# Patient Record
Sex: Male | Born: 1945 | Hispanic: Refuse to answer | Marital: Married | Smoking: Never smoker
Health system: Southern US, Community
[De-identification: ages and names within clinical notes are randomized; demographics above are authoritative.]

## PROBLEM LIST (undated history)

## (undated) DIAGNOSIS — I517 Cardiomegaly: Secondary | ICD-10-CM

## (undated) DIAGNOSIS — T884XXA Failed or difficult intubation, initial encounter: Secondary | ICD-10-CM

## (undated) DIAGNOSIS — H409 Unspecified glaucoma: Secondary | ICD-10-CM

## (undated) DIAGNOSIS — I25119 Atherosclerotic heart disease of native coronary artery with unspecified angina pectoris: Secondary | ICD-10-CM

## (undated) DIAGNOSIS — M1990 Unspecified osteoarthritis, unspecified site: Secondary | ICD-10-CM

## (undated) DIAGNOSIS — E785 Hyperlipidemia, unspecified: Secondary | ICD-10-CM

## (undated) DIAGNOSIS — I10 Essential (primary) hypertension: Secondary | ICD-10-CM

## (undated) DIAGNOSIS — E119 Type 2 diabetes mellitus without complications: Secondary | ICD-10-CM

## (undated) DIAGNOSIS — I2510 Atherosclerotic heart disease of native coronary artery without angina pectoris: Secondary | ICD-10-CM

## (undated) DIAGNOSIS — K635 Polyp of colon: Secondary | ICD-10-CM

## (undated) DIAGNOSIS — T4145XA Adverse effect of unspecified anesthetic, initial encounter: Secondary | ICD-10-CM

## (undated) DIAGNOSIS — T8859XA Other complications of anesthesia, initial encounter: Secondary | ICD-10-CM

## (undated) DIAGNOSIS — C801 Malignant (primary) neoplasm, unspecified: Secondary | ICD-10-CM

## (undated) DIAGNOSIS — M4800 Spinal stenosis, site unspecified: Secondary | ICD-10-CM

## (undated) DIAGNOSIS — K5792 Diverticulitis of intestine, part unspecified, without perforation or abscess without bleeding: Secondary | ICD-10-CM

## (undated) DIAGNOSIS — Z87442 Personal history of urinary calculi: Secondary | ICD-10-CM

## (undated) DIAGNOSIS — K219 Gastro-esophageal reflux disease without esophagitis: Secondary | ICD-10-CM

## (undated) DIAGNOSIS — G4730 Sleep apnea, unspecified: Secondary | ICD-10-CM

## (undated) DIAGNOSIS — R51 Headache: Secondary | ICD-10-CM

## (undated) HISTORY — DX: Hyperlipidemia, unspecified: E78.5

## (undated) HISTORY — PX: OTHER SURGICAL HISTORY: SHX169

## (undated) HISTORY — DX: Spinal stenosis, site unspecified: M48.00

## (undated) HISTORY — PX: COLON SURGERY: SHX602

## (undated) HISTORY — DX: Cardiomegaly: I51.7

## (undated) HISTORY — PX: LITHOTRIPSY: SUR834

## (undated) HISTORY — DX: Unspecified glaucoma: H40.9

## (undated) HISTORY — DX: Atherosclerotic heart disease of native coronary artery with unspecified angina pectoris: I25.119

## (undated) HISTORY — PX: TONSILLECTOMY: SUR1361

## (undated) HISTORY — DX: Diverticulitis of intestine, part unspecified, without perforation or abscess without bleeding: K57.92

## (undated) HISTORY — PX: CORONARY ANGIOPLASTY: SHX604

## (undated) HISTORY — PX: CATARACT EXTRACTION: SUR2

## (undated) SURGERY — Surgical Case
Anesthesia: *Unknown

---

## 1983-11-17 HISTORY — PX: BOWEL RESECTION: SHX1257

## 1988-11-16 HISTORY — PX: CHOLECYSTECTOMY: SHX55

## 1989-11-16 HISTORY — PX: APPENDECTOMY: SHX54

## 2004-02-10 ENCOUNTER — Emergency Department (HOSPITAL_COMMUNITY): Admission: EM | Admit: 2004-02-10 | Discharge: 2004-02-10 | Payer: Self-pay | Admitting: Emergency Medicine

## 2004-11-16 HISTORY — PX: CARDIAC CATHETERIZATION: SHX172

## 2005-07-23 ENCOUNTER — Ambulatory Visit (HOSPITAL_COMMUNITY): Admission: RE | Admit: 2005-07-23 | Discharge: 2005-07-24 | Payer: Self-pay | Admitting: Cardiovascular Disease

## 2005-08-27 ENCOUNTER — Encounter (HOSPITAL_COMMUNITY): Admission: RE | Admit: 2005-08-27 | Discharge: 2005-11-25 | Payer: Self-pay | Admitting: Cardiovascular Disease

## 2005-11-26 ENCOUNTER — Encounter (HOSPITAL_COMMUNITY): Admission: RE | Admit: 2005-11-26 | Discharge: 2006-02-24 | Payer: Self-pay | Admitting: Cardiovascular Disease

## 2007-06-09 ENCOUNTER — Ambulatory Visit (HOSPITAL_COMMUNITY): Admission: RE | Admit: 2007-06-09 | Discharge: 2007-06-09 | Payer: Self-pay | Admitting: Otolaryngology

## 2007-10-28 ENCOUNTER — Ambulatory Visit: Payer: Self-pay | Admitting: Cardiology

## 2007-11-03 ENCOUNTER — Ambulatory Visit: Payer: Self-pay | Admitting: Cardiology

## 2007-12-08 ENCOUNTER — Ambulatory Visit: Payer: Self-pay

## 2007-12-08 LAB — CONVERTED CEMR LAB
Albumin: 3.8 g/dL (ref 3.5–5.2)
Cholesterol: 114 mg/dL (ref 0–200)
HDL: 30.8 mg/dL — ABNORMAL LOW (ref >39.0)
LDL Cholesterol: 62 mg/dL (ref 0–99)
Total Bilirubin: 0.8 mg/dL (ref 0.3–1.2)
Total Protein: 6.4 g/dL (ref 6.0–8.3)
VLDL: 21 mg/dL (ref 0–40)

## 2007-12-15 ENCOUNTER — Ambulatory Visit: Payer: Self-pay | Admitting: Cardiology

## 2008-04-26 ENCOUNTER — Ambulatory Visit: Payer: Self-pay | Admitting: Cardiology

## 2008-05-02 ENCOUNTER — Ambulatory Visit: Payer: Self-pay | Admitting: Cardiology

## 2008-07-11 ENCOUNTER — Encounter: Admission: RE | Admit: 2008-07-11 | Discharge: 2008-07-11 | Payer: Self-pay | Admitting: Family Medicine

## 2008-07-18 ENCOUNTER — Ambulatory Visit: Payer: Self-pay | Admitting: Cardiology

## 2008-07-18 LAB — CONVERTED CEMR LAB
ALT: 24 units/L (ref 0–53)
AST: 17 units/L (ref 0–37)
Albumin: 3.9 g/dL (ref 3.5–5.2)
Cholesterol: 162 mg/dL (ref 0–200)
LDL Cholesterol: 110 mg/dL — ABNORMAL HIGH (ref 0–99)
Total Protein: 6.4 g/dL (ref 6.0–8.3)
Triglycerides: 93 mg/dL (ref 0–149)

## 2008-07-26 ENCOUNTER — Ambulatory Visit: Payer: Self-pay | Admitting: Cardiology

## 2008-08-06 ENCOUNTER — Encounter: Admission: RE | Admit: 2008-08-06 | Discharge: 2008-08-06 | Payer: Self-pay | Admitting: Family Medicine

## 2008-09-27 ENCOUNTER — Ambulatory Visit: Payer: Self-pay | Admitting: Cardiology

## 2008-09-27 LAB — CONVERTED CEMR LAB
ALT: 33 units/L (ref 0–53)
Albumin: 4.1 g/dL (ref 3.5–5.2)
Bilirubin, Direct: 0.2 mg/dL (ref 0.0–0.3)
Direct LDL: 68.5 mg/dL
LDL Cholesterol: 71 mg/dL (ref 0–99)
Total Bilirubin: 0.9 mg/dL (ref 0.3–1.2)
Total CHOL/HDL Ratio: 4.6
Triglycerides: 125 mg/dL (ref 0–149)

## 2008-10-04 ENCOUNTER — Ambulatory Visit: Payer: Self-pay | Admitting: Internal Medicine

## 2008-10-30 ENCOUNTER — Ambulatory Visit: Payer: Self-pay

## 2008-10-30 ENCOUNTER — Ambulatory Visit: Payer: Self-pay | Admitting: Cardiology

## 2008-10-30 LAB — CONVERTED CEMR LAB: Creatinine, Ser: 1.2 mg/dL (ref 0.4–1.5)

## 2008-11-01 ENCOUNTER — Ambulatory Visit: Payer: Self-pay

## 2009-07-05 ENCOUNTER — Encounter: Payer: Self-pay | Admitting: Cardiology

## 2009-12-16 ENCOUNTER — Encounter: Payer: Self-pay | Admitting: Cardiology

## 2009-12-17 ENCOUNTER — Telehealth: Payer: Self-pay | Admitting: Cardiology

## 2010-05-02 ENCOUNTER — Ambulatory Visit (HOSPITAL_COMMUNITY): Admission: RE | Admit: 2010-05-02 | Discharge: 2010-05-03 | Payer: Self-pay | Admitting: Urology

## 2010-10-14 ENCOUNTER — Ambulatory Visit: Payer: Self-pay | Admitting: Cardiology

## 2010-10-14 DIAGNOSIS — G43909 Migraine, unspecified, not intractable, without status migrainosus: Secondary | ICD-10-CM

## 2010-10-14 DIAGNOSIS — E785 Hyperlipidemia, unspecified: Secondary | ICD-10-CM | POA: Insufficient documentation

## 2010-10-14 DIAGNOSIS — H409 Unspecified glaucoma: Secondary | ICD-10-CM | POA: Insufficient documentation

## 2010-10-14 DIAGNOSIS — E119 Type 2 diabetes mellitus without complications: Secondary | ICD-10-CM

## 2010-10-14 DIAGNOSIS — K5730 Diverticulosis of large intestine without perforation or abscess without bleeding: Secondary | ICD-10-CM | POA: Insufficient documentation

## 2010-10-14 DIAGNOSIS — R0602 Shortness of breath: Secondary | ICD-10-CM | POA: Insufficient documentation

## 2010-10-14 DIAGNOSIS — G4733 Obstructive sleep apnea (adult) (pediatric): Secondary | ICD-10-CM | POA: Insufficient documentation

## 2010-10-14 DIAGNOSIS — I2510 Atherosclerotic heart disease of native coronary artery without angina pectoris: Secondary | ICD-10-CM

## 2010-10-14 DIAGNOSIS — I10 Essential (primary) hypertension: Secondary | ICD-10-CM | POA: Insufficient documentation

## 2010-10-23 ENCOUNTER — Encounter (INDEPENDENT_AMBULATORY_CARE_PROVIDER_SITE_OTHER): Payer: Self-pay | Admitting: *Deleted

## 2010-12-07 ENCOUNTER — Encounter: Payer: Self-pay | Admitting: Urology

## 2010-12-16 NOTE — Assessment & Plan Note (Signed)
Summary: F2Y./ PT HAS [employer]/  GD  Medications Added PROPRANOLOL HCL CR 120 MG XR24H-CAP (PROPRANOLOL HCL) Take 1 capsule by mouth two times a day LISINOPRIL 10 MG TABS (LISINOPRIL) Take one tablet by mouth daily TRICOR 145 MG TABS (FENOFIBRATE) Take 1 tablet by mouth once a day LIPITOR 80 MG TABS (ATORVASTATIN CALCIUM) Take one tablet by mouth daily. ASPIRIN 81 MG TBEC (ASPIRIN) Take one tablet by mouth daily METFORMIN HCL 500 MG TABS (METFORMIN HCL) Take 1 tablet by mouth once a day GLIMEPIRIDE 4 MG TABS (GLIMEPIRIDE) take 1/2 tablet two times a day FISH OIL 1200 MG CAPS (OMEGA-3 FATTY ACIDS) Take 1 capsule by mouth two times a day MULTIVITAMINS  TABS (MULTIPLE VITAMIN) Take 1 tablet by mouth once a day EYE VITAMINS  CAPS (MULTIPLE VITAMINS-MINERALS) Take 1 capsule by mouth once a day      Allergies Added: NKDA  Visit Type:  2 years follow up Primary Provider:  Nadyne Coombes  CC:  No complaints.  History of Present Illness: [name] is a pleasant gentleman who has a history of coronary artery disease.  He has had previous PCI of his right coronary artery using a Cypher drug-eluting stent in 2006.  His ejection fraction is preserved. Last Myoview in Dec 2009 showed normal LV function with an ejection fraction of 68% and no ischemia or infarction. I have not seen him since Dec 2009. Since then, the patient has dyspnea with more extreme activities but not with routine activities. It is relieved with rest. It is not associated with chest pain. There is no orthopnea, PND or pedal edema. There is no syncope or palpitations. There is no exertional chest pain.    Current Medications (verified): 1)  Propranolol Hcl Cr 120 Mg Xr24h-Cap (Propranolol Hcl) .... Take 1 Capsule By Mouth Two Times A Day 2)  Lisinopril 10 Mg Tabs (Lisinopril) .... Take One Tablet By Mouth Daily 3)  Tricor 145 Mg Tabs (Fenofibrate) .... Take 1 Tablet By Mouth Once A Day 4)  Lipitor 80 Mg Tabs (Atorvastatin Calcium)  .... Take One Tablet By Mouth Daily. 5)  Aspirin 81 Mg Tbec (Aspirin) .... Take One Tablet By Mouth Daily 6)  Metformin Hcl 500 Mg Tabs (Metformin Hcl) .... Take 1 Tablet By Mouth Once A Day 7)  Glimepiride 4 Mg Tabs (Glimepiride) .... Take 1/2 Tablet Two Times A Day 8)  Fish Oil 1200 Mg Caps (Omega-3 Fatty Acids) .... Take 1 Capsule By Mouth Two Times A Day 9)  Multivitamins  Tabs (Multiple Vitamin) .... Take 1 Tablet By Mouth Once A Day 10)  Eye Vitamins  Caps (Multiple Vitamins-Minerals) .... Take 1 Capsule By Mouth Once A Day  Allergies (verified): No Known Drug Allergies  Past History:  Past Medical History: HYPERLIPIDEMIA  HYPERTENSION CAD OBSTRUCTIVE SLEEP APNEA  MIGRAINE HEADACHE  GLAUCOMA DIVERTICULAR DISEASE DM   Past Surgical History: Reviewed history from 10/14/2010 and no changes required. PCI of his right coronary  artery using a Cypher drug-eluting stent in 2006.  Social History: Reviewed history from 10/14/2010 and no changes required.  He does not smoke. He consumes one alcoholic beverage   per evening. He is married, but has no children.   Review of Systems       no fevers or chills, productive cough, hemoptysis, dysphasia, odynophagia, melena, hematochezia, dysuria, hematuria, rash, seizure activity, orthopnea, PND, pedal edema, claudication. Remaining systems are negative.   Vital Signs:  Patient profile:   65 year old male Height:  72 inches Weight:      215.50 pounds BMI:     29.33 Pulse rate:   60 / minute Pulse rhythm:   regular Resp:     18 per minute BP sitting:   136 / 80  (left arm) Cuff size:   large  Vitals Entered By: Vikki Ports (October 14, 2010 12:00 PM)  Physical Exam  General:  Well-developed well-nourished in no acute distress.  Skin is warm and dry.  HEENT is normal.  Neck is supple. No thyromegaly.  Chest is clear to auscultation with normal expansion.  Cardiovascular exam is regular rate and rhythm.  Abdominal  exam nontender or distended. No masses palpated. Extremities show no edema. neuro grossly intact    EKG  Procedure date:  10/14/2010  Findings:      Sinus rhythm at a rate of 60. Nonspecific ST changes.  Impression & Recommendations:  Problem # 1:  HYPERLIPIDEMIA (ICD-272.4) Continue present medications. Lipids and liver monitored by primary care. His updated medication list for this problem includes:    Tricor 145 Mg Tabs (Fenofibrate) .Marland Kitchen... Take 1 tablet by mouth once a day    Lipitor 80 Mg Tabs (Atorvastatin calcium) .Marland Kitchen... Take one tablet by mouth daily.  Problem # 2:  HYPERTENSION (ICD-401.9) Blood pressure controlled on present medications. Will continue. Potassium and renal function monitored by primary care. His updated medication list for this problem includes:    Propranolol Hcl Cr 120 Mg Xr24h-cap (Propranolol hcl) .Marland Kitchen... Take 1 capsule by mouth two times a day    Lisinopril 10 Mg Tabs (Lisinopril) .Marland Kitchen... Take one tablet by mouth daily    Aspirin 81 Mg Tbec (Aspirin) .Marland Kitchen... Take one tablet by mouth daily  Problem # 3:  CAD (ICD-414.00) Continue aspirin, beta blocker, ACE inhibitor and statin. Followup Myoview when he returns in one year. His updated medication list for this problem includes:    Propranolol Hcl Cr 120 Mg Xr24h-cap (Propranolol hcl) .Marland Kitchen... Take 1 capsule by mouth two times a day    Lisinopril 10 Mg Tabs (Lisinopril) .Marland Kitchen... Take one tablet by mouth daily    Aspirin 81 Mg Tbec (Aspirin) .Marland Kitchen... Take one tablet by mouth daily  Orders: EKG w/ Interpretation (93000)  Problem # 4:  OBSTRUCTIVE SLEEP APNEA (ICD-327.23)  Problem # 5:  DM (ICD-250.00) Management per primary care. His updated medication list for this problem includes:    Lisinopril 10 Mg Tabs (Lisinopril) .Marland Kitchen... Take one tablet by mouth daily    Aspirin 81 Mg Tbec (Aspirin) .Marland Kitchen... Take one tablet by mouth daily    Metformin Hcl 500 Mg Tabs (Metformin hcl) .Marland Kitchen... Take 1 tablet by mouth once a day     Glimepiride 4 Mg Tabs (Glimepiride) .Marland Kitchen... Take 1/2 tablet two times a day  Patient Instructions: 1)  Your physician recommends that you schedule a follow-up appointment in: YEAR WITH DR CRENSHAW 2)  Your physician recommends that you continue on your current medications as directed. Please refer to the Current Medication list given to you today.

## 2010-12-16 NOTE — Miscellaneous (Signed)
  Clinical Lists Changes med varified with pt Deliah Goody, RN  October 23, 2010 5:09 PM  Medications: Changed medication from LISINOPRIL 10 MG TABS (LISINOPRIL) Take one tablet by mouth daily to LISINOPRIL 20 MG TABS (LISINOPRIL) Take one tablet by mouth daily Added new medication of AMLODIPINE BESYLATE 5 MG TABS (AMLODIPINE BESYLATE) Take one tablet by mouth daily Added new medication of ALPHAGAN P 0.1 % SOLN (BRIMONIDINE TARTRATE) two times a day Added new medication of LUMIGAN 0.03 % SOLN (BIMATOPROST) once daily in the evening Added new medication of DIVALPROEX SODIUM 250 MG XR24H-TAB (DIVALPROEX SODIUM) one tab by mouth two times a day Added new medication of MELOXICAM 7.5 MG TABS (MELOXICAM) 1-2 tab by mouth as needed Added new medication of OXYCODONE HCL 5 MG TABS (OXYCODONE HCL) 1-2 tabs by mouth as needed Added new medication of * VITAMIN ATHRUZ PLUS LUTEIN one tab once daily Added new medication of VITAMIN D3 2000 UNIT CAPS (CHOLECALCIFEROL) once daily Added new medication of FISH OIL 1200 MG CAPS (OMEGA-3 FATTY ACIDS) one tab two to three times daily Added new medication of * MAGESIUM 60-125MG  as needed

## 2010-12-16 NOTE — Progress Notes (Signed)
Summary: stop plavix   Phone Note From Other Clinic   Summary of Call: Per Morrie Sheldon pt having colonoscopy on the 10th needs to stop Plavix & ASA 5 days prior ofc [phone] fax [phone] Initial call taken by: Edman Circle,  December 17, 2009 8:47 AM  Follow-up for Phone Call        ok to stop plavix for good; continue asa 81 mg by mouth daily prior to colonoscopy if possible Ferman Hamming, MD, [facility]  December 17, 2009 1:50 PM  Left message to call back Deliah Goody, RN  December 17, 2009 3:46 PM  RETURNING Blanca Friend  December 18, 2009 8:47 AM   Additional Follow-up for Phone Call Additional follow up Details #1::        spoke with pt,  he is aware he can stop his plavix Deliah Goody, RN  December 18, 2009 12:50 PM

## 2011-02-01 LAB — GLUCOSE, CAPILLARY
Glucose-Capillary: 124 mg/dL — ABNORMAL HIGH (ref 70–99)
Glucose-Capillary: 144 mg/dL — ABNORMAL HIGH (ref 70–99)
Glucose-Capillary: 154 mg/dL — ABNORMAL HIGH (ref 70–99)
Glucose-Capillary: 163 mg/dL — ABNORMAL HIGH (ref 70–99)
Glucose-Capillary: 164 mg/dL — ABNORMAL HIGH (ref 70–99)
Glucose-Capillary: 174 mg/dL — ABNORMAL HIGH (ref 70–99)
Glucose-Capillary: 177 mg/dL — ABNORMAL HIGH (ref 70–99)

## 2011-02-02 LAB — COMPREHENSIVE METABOLIC PANEL
ALT: 27 U/L (ref 0–53)
AST: 25 U/L (ref 0–37)
Albumin: 4.1 g/dL (ref 3.5–5.2)
Alkaline Phosphatase: 48 U/L (ref 39–117)
BUN: 18 mg/dL (ref 6–23)
CO2: 31 mEq/L (ref 19–32)
Calcium: 11 mg/dL — ABNORMAL HIGH (ref 8.4–10.5)
Chloride: 106 mEq/L (ref 96–112)
Creatinine, Ser: 1.21 mg/dL (ref 0.4–1.5)
GFR calc Af Amer: >60 mL/min (ref >60)
GFR calc non Af Amer: >60 mL/min (ref >60)
Glucose, Bld: 159 mg/dL — ABNORMAL HIGH (ref 70–99)
Potassium: 3.6 mEq/L (ref 3.5–5.1)
Sodium: 142 mEq/L (ref 135–145)
Total Bilirubin: 0.6 mg/dL (ref 0.3–1.2)
Total Protein: 6.5 g/dL (ref 6.0–8.3)

## 2011-02-02 LAB — SURGICAL PCR SCREEN
MRSA, PCR: NEGATIVE
Staphylococcus aureus: NEGATIVE

## 2011-02-02 LAB — CBC
HCT: 40.1 % (ref 39.0–52.0)
Hemoglobin: 14 g/dL (ref 13.0–17.0)
MCHC: 34.8 g/dL (ref 30.0–36.0)
MCV: 94.6 fL (ref 78.0–100.0)
Platelets: 229 10*3/uL (ref 150–400)
RBC: 4.24 MIL/uL (ref 4.22–5.81)
RDW: 13.1 % (ref 11.5–15.5)
WBC: 6.7 10*3/uL (ref 4.0–10.5)

## 2011-03-31 NOTE — Assessment & Plan Note (Signed)
[facility]                               LIPID CLINIC NOTE   [name], [name]                    MRN:          [id number]  DATE:07/26/2008                            DOB:          1946-05-05    [name] comes in today for followup of his hyperlipidemia therapy which  includes Lipitor 40 mg daily, TriCor 145 mg daily, fish oil 1760 mg.  He  has been compliant with these three therapies and is tolerating them  just fine.  No new muscle aches or pains to report.  His other  medications have not changed.  They include lisinopril with  hydrochlorothiazide, amlodipine, metformin, propranolol, Plavix,  Imitrex, Lumigan, Alphagan, aspirin 81 mg, multivitamin and vitamin D3.   PHYSICAL EXAMINATION:  VITAL SIGNS:  Weight is 223 pounds, blood  pressure is 130/70, and heart rate is 62.   LABORATORY DATA:  Total cholesterol 162, triglycerides 93, HDL 33.9, and  LDL 110.  Liver function tests are within normal limits.   ASSESSMENT:  [name] triglycerides are greatly improved and now are at  goal of less than 150.  His HDL is about the same and is not at the goal  of greater than 45.  His LDL has worsened from 89 to 110 and is not at  the goal of less than 170.  He has been guilty of dietary indiscretions  lately especially with his wife traveling for her job.  This is however  going to be coming to an end soon.  With that he will have a better diet  with liner meats, less fast food, and less fried foods.  As far the  exercise is concern, [name] is still very resistant to do any sort of  scheduled exercising.  I stressed the importance of exercise and diet  along with medications to help his cholesterol under control.   PLAN:  We will increase his Lipitor 80 mg daily.  I again stressed the  importance of lifestyle modifications.  We will follow up with him in 10  weeks with repeat labs and new prescription for Lipitor 80 mg was sent  to [facility].  Of note,  [name] medical insurance will be expiring in  about a month and he will at that time be a self-paid patient, and he  questions if his visits can be minimized or eliminated and have Dr.  Jens Som follow his cholesterol.  I told him that will be an option  especially if this dosage change brings his cholesterol in line with the  goal.      Charolotte Eke, PharmD  Electronically Signed      Rollene Rotunda, MD, [facility]  Electronically Signed   TP/MedQ  DD: 07/26/2008  DT: 07/27/2008  Job #: [id number]

## 2011-03-31 NOTE — Assessment & Plan Note (Signed)
[facility]                               LIPID CLINIC NOTE   DVAUGHN, FICKLE                    MRN:          [id number]  DATE:10/04/2008                            DOB:          [birth year]    Mr. Marich comes in today for followup of his hyperlipidemia therapy which  includes Lipitor 80 mg daily, TriCor 145 mg daily, and fish oil 1760 mg  daily, and over-the-counter products, so the medications have not  changed.  They include lisinopril with hydrochlorothiazide, amlodipine,  metformin, propranolol, Plavix, Imitrex, Lumigan, Alphagan, aspirin 81  mg, multivitamin, and vitamin D3.  He is compliant with his  hyperlipidemia medications.  He continues to have problems with cramping  at night, but this has been an ongoing problem and has not worsened with  the increase in Lipitor dosage.   PHYSICAL EXAMINATION:  VITAL SIGNS:  Weight is 228 pounds, blood  pressure is 125/70, and heart rate is 62.   LABORATORY DATA:  Total cholesterol 122, triglycerides 125, HDL 26.5,  and LDL 71.  Liver function tests within normal limits.  Creatine kinase  is 232.   ASSESSMENT:  Mr. Siefring has triglycerides at goal, HDL not quite at goal  greater than 45, and LDL is very close to goal less than 70.  His  creatine kinase is slightly elevated, but he is asymptomatic of any  myopathies except for just ongoing cramping that he has at night, also  ongoing joint pain.  Mr. Sparr is resistant to any regular exercise  program.  He does continue to try to use a heart healthy diet in  general.  Mr. Nancarrow has concerns about continuing to come to see [place] in the  lipid clinic as his medical insurance coverage changes at the end of  2009.  He requests to discontinue followup here in the lipid clinic and  just continue to see Dr. Jens Som.  We have him adjust his lipid  medications.   PLAN:  We are going to continue with same medications.  He has an  appointment to see Dr. Jens Som  on October 30, 2008.  I will order a  creatine kinase followup at that time for Dr. Jens Som to review.  Mr.  Keadle was asked to give [place] a call if any concerns in the meantime.      Charolotte Eke, PharmD  Electronically Signed      Madolyn Frieze. Jens Som, MD, [facility]  Electronically Signed   TP/MedQ  DD: 10/04/2008  DT: 10/05/2008  Job #: [id number]

## 2011-03-31 NOTE — Assessment & Plan Note (Signed)
[facility]                            CARDIOLOGY OFFICE NOTE   TYRICE, HEWITT                    MRN:          [id number]  DATE:10/30/2008                            DOB:          1946-06-12    Mr. Coleman is a pleasant gentleman who has a history of coronary artery  disease.  He has had previous PCI of his right coronary artery using a  Cypher drug-eluting stent in 2006.  His ejection fraction is preserved.  Since I last saw him, he does have dyspnea on exertion.  There is no  orthopnea or PND and there is no pedal edema.  He has not had chest pain  or syncope.  Note, his dyspnea is not associated with chest pain or  productive cough.  It occurs with exertion and is relieved with rest.  Note, he had no symptoms of chest pain at the time of his previous  intervention.  Also note that his dyspnea may be slightly worse compared  to when I last saw him on May 02, 2008.   MEDICATIONS:  1. Lisinopril/hydrochlorothiazide 10/12.5 mg p.o. daily.  2. Amlodipine 5 mg p.o. daily.  3. Metformin HCl 500 mg p.o. daily.  4. Propranolol ER 120 mg p.o. b.i.d.  5. Plavix 75 mg p.o. daily.  6. Imitrex.  7. Lumigan eye drops.  8. Alphagan eye drops.  9. TriCor 145 mg p.o. daily.  10.Aspirin 81 mg p.o. daily.  11.Fish oil.  12.Multivitamin.  13.Vitamin D.  14.Lipitor 80 mg p.o. daily.   PHYSICAL EXAMINATION:  VITAL SIGNS:  Today shows a blood pressure of  132/70 and the pulse is 65.  He weighs 229 pounds.  HEENT:  Normal.  NECK:  Supple.  No bruits.  CHEST:  Clear.  CARDIOVASCULAR:  Regular rate and rhythm.  ABDOMEN:  No tenderness.  EXTREMITIES:  No edema.   His electrocardiogram shows sinus rhythm at a rate of 65.  The axis is  normal.  There are nonspecific ST changes.   DIAGNOSES:  1. Dyspnea - Mr. Imes does not appear to be volume overloaded on exam      today.  I wonder whether this may be an anginal equivalent.  We      will schedule him to  have an adenosine Myoview.  If it shows normal      perfusion, we will continue with medical therapy.  2. Coronary artery disease - He will continue on his aspirin, Plavix,      statin, beta-blocker, and ACE inhibitor.  3. Hypertension - His blood pressures is adequately controlled on his      present medications.  4. Hyperlipidemia - He will continue on his statin, fish oil, and      TriCor.  We will check his CK today.  5. Diabetes mellitus.  6. History of diverticulitis.   I will see him back in 6 months.     Madolyn Frieze Jens Som, MD, [facility]  Electronically Signed    BSC/MedQ  DD: 10/30/2008  DT: 10/30/2008  Job #: [id number]

## 2011-03-31 NOTE — Assessment & Plan Note (Signed)
[facility]                               LIPID CLINIC NOTE   BREANNA, MCDANIEL                    MRN:          [id number]  DATE:11/03/2007                            DOB:          22-Dec-1945    Mr. Riso comes in today for his first visit with [place] in the lipid clinic.  Mr. Guillet was referred to the clinic by Dr. Jens Som.  His primary care  physician is Dr. Nadyne Coombes.   PAST MEDICAL HISTORY:  1. Hypertension.  2. Coronary artery disease with stenting in 2006.  3. Obstructive sleep apnea, for which he uses CPAP.  4. Migraine headaches.  5. Glaucoma.  6. He also reports a borderline type 2 diabetes for which he has been      medicated for the last 9 months.   His current medications for cholesterol lowering include Lopid 600 mg  daily, simvastatin 80 mg daily, and Niaspan 500 mg daily.  His other  medications include lisinopril with hydrochlorothiazide, amlodipine,  metformin, propranolol, Plavix, Imitrex, Lumigan, and Alphagan.   ALLERGIES:  Intolerance to FLAGYL and LEVAQUIN.   SOCIAL HISTORY:  He does not use tobacco products or drink alcoholic  beverages.   FAMILY HISTORY OF HEART DISEASE:  A paternal grandmother, aunt, and  cousin, all deceased from myocardial infarctions.   PHYSICAL EXAMINATION:  Weight of 221, blood pressure 115/70, heart rate  is 62.   LABORATORY DATA:  Total cholesterol 164, triglycerides 161, HDL 36, LDL  96.   ASSESSMENT:  Mr. Rusnak requires aggressive lipid lowering secondary to his  history of coronary artery disease and type 2 diabetes.  His  triglycerides are just above the goal of less than 150.  His HDL is just  below the goal of greater than 40.  His LDL is not at goal of less than  70.  He has been tolerating simvastatin 80 and Lopid 600 mg with no  muscle aches or pains or any other concerns.  According to the black box  warning for simvastatin, simvastatin doses should not exceed 10 mg daily  when  used concurrently with Lopid due to Lopid's inhibition of  simvastatin metabolism, resulting in increased risks of myopathies.  He  reports that he was originally prescribed Lipitor and Tricor, and his  pharmacy benefits required him to use simvastatin and Lopid.  We talked  about a heart-healthy diet and exercise program that would help along  with the medication therapy.  He was encouraged to start with an  exercise program.  He reports occasional cramping in his calves during  his sleep, but other than that has no physical limitations for physical  activity.   PLAN:  We are going to discontinue Lopid and simvastatin.  We are going  to continue Niaspan 500 mg daily, start Lipitor 40 mg daily, and Tricor  145 mg daily.  I gave him some samples as well as some prescriptions,  signed dispense as written so that the pharmacy does not make any  substitutions.  We are going to follow up in 6 weeks with a repeat  liver  and lipid panel, and he was encouraged to call if any problems or  concerns in the meantime.      Charolotte Eke, PharmD  Electronically Signed      Madolyn Frieze. Jens Som, MD, [facility]  Electronically Signed   TP/MedQ  DD: 11/03/2007  DT: 11/04/2007  Job #: [id number]   cc:   Clydie Braun L. Hal Hope, M.D.

## 2011-03-31 NOTE — Assessment & Plan Note (Signed)
[facility]                               LIPID CLINIC NOTE   PAULA, ZIETZ                    MRN:          [id number]  DATE:04/26/2008                            DOB:          11/08/1946     Return office visit for Lipid Clinic.   PAST MEDICAL HISTORY:  1. Hyperlipidemia.  2. Hypertension.  3. Glaucoma.  4. Migraines.  5. Coronary artery disease, status post stent in 2006.  6. Obstructive sleep apnea, for which he uses CPAP at bedtime.  7. Diabetes mellitus.   MEDICATIONS:  1. Lisinopril 10 mg daily.  2. Hydrochlorothiazide 12.5 mg daily.  3. Norvasc 5 mg daily.  4. Metformin 500 mg daily.  5. Propranolol 120 mg twice daily.  6. Plavix 75 mg daily.  7. Imitrex 50 as needed.  8. Lumigan daily.  9. Alphagan daily.  10.Lipitor 40 mg daily.  11.TriCor 145 mg daily.  12.Aspirin 81 mg daily.  13.Fish oil 1200 mg once daily.   VITAL SIGNS:  Weight 223 pounds, blood pressure 124/76, and heart rate  76.   LABORATORY DATA:  Total cholesterol 156, triglyceride 162, HDL 35, and  LDL 89.  LFTs within normal limits.   ASSESSMENT:  Mr. Vassar is a pleasant 65 year old gentleman who returns to  Lipid Clinic today with no chest pain, no shortness of breath, no muscle  aches or pains.  He states compliance with current medication regimen.  However, it seems that he has been noncompliant with his exercise and  diet.    Of note, that he has also self-discontinued his Niaspan secondary to  extreme headaches and hypertension.  He is unwilling at this time to  retry the Niaspan.  He also again, as stated before, has been very  noncompliant with lifestyle modification.  He says that he does not like  or enjoy exercise.  He therefore has decided not to do any.  He also  states that with his wife's traveling that he has increased the amount  of fats in his diet, his portion sizes, and also increased his  carbohydrates.  He does seem to eat out  several nights a week,  he  states that they mostly pick [place] restaurants and [place] cuisine,  although at that place, he does not always pick the healthiest foods and  normally eats large portions.  He has increased the amount of oil and  cheese that he had previously been eating.  He also makes a point that  he has increased fats and portions of his sandwiches and foods that he  had previously eaten for lunch.  He states that he is much healthier  whenever his wife is around and more strict about his diet.  However,  with her travels in recent month, he has begun to snack much more often  and to make four choices for his meals.  He does understand that his  total cholesterol is at goal of less than 200, triglycerides are greater  than goal of less than 150, his HDL is less than goal of greater than  40,  and his LDL is greater than goal of less than 70.  These are all  changes in the wrong direction since his last visit.  He does also  understand that if he does not make his lifestyle changes to bring down  his cholesterol that we will either go up in his Lipitor or add Zetia at  the next visit.   PLAN:  1. Continue current medication therapy.  2. Increase exercise whether it be walking, riding his bicycle, or      swimming at the [facility].  3. Decrease the fats in his diet.  4. Increase exercise.  5. Followup visit in 3 months for lipid panel and LFTs and make      adjustments needed at that time.      Leota Sauers, PharmD  Electronically Signed      Jesse Sans. Daleen Squibb, MD, [facility]  Electronically Signed   LC/MedQ  DD: 04/26/2008  DT: 04/27/2008  Job #: [id number]

## 2011-03-31 NOTE — Assessment & Plan Note (Signed)
[facility]                            CARDIOLOGY OFFICE NOTE   NAME:[name], [name]                    MRN:          [id number]  DATE:10/28/2007                            DOB:          [birth year]    The patient is a 65 year old male with a past medical history of  coronary disease who presents for preoperative evaluation and  establishment with a new cardiologist. The patient has previously been  followed at [facility] and [name]. He was initially seen by  Dr. Allyson Bond of [facility] in August 2006. He apparently had exertional  chest fullness and shortness of breath per the notes and a 2D  echocardiogram and Myoview were normal. However, he had persistent  symptoms. He, therefore, underwent cardiac catheterization. This was  performed on 07/14/2005. The left main was normal. There was a 40% to 50%  stenosis in the first septal and diagonal branch. The patient had 30%  proximal right coronary artery and then a 95% high grade distal lesion  at the crux involving the PDA and posterior lateral. Note that his LV  function was normal. Also note that he had a distal abdominal aortogram.  The renal arteries were widely patent and the infrarenal abdominal aorta  appeared to be free of significant disease. At that time, the patient  had PCI of the right coronary artery using a Cipher drug-eluting stent.  Note that his most recent Myoview was performed on 06/30/2007. The  ejection fraction was 63%. There were no EKG changes and there was no  ischemia noted.   The patient presents today to re-establish here as he would like to be  followed with [name]. He also would like to have nasal septal surgery  and wanted evaluation prior to this. Note that he does occasionally have  a pain in his chest that is in the left chest area. It can last 4 to 5  minutes and resolves spontaneously. It is not pleuritic or positional  nor is it related to food. It is not  exertional. The pain does not  radiate. Note that he does not have exertional chest pain. He also does  not have dyspnea on exertion, orthopnea, PND, or pedal edema. He does  state that he has difficulties breathing due to upper airway issues  related to his nasal septum. He had been evaluated by one of the ENT  physicians and they would like to perform surgery. Dr. Allyson Bond recommended  that he not off of Plavix and he presented for further evaluation. Note  that he does not have claudication.   CURRENT MEDICATIONS:  1. Lisinopril HCT 10/12.5 mg daily.  2. Amlodipine 5 mg daily.  3. Gemfibrozil 60 mg daily.  4. Zocor 80 mg daily.  5. Niaspan 500 mg daily.  6. Metformin 500 mg daily.  7. Propranolol HCL ER 120 mg b.i.d.  8. Plavix 75 mg daily.  9. Imitrex.  10.Aspirin 81 mg daily.   ALLERGIES:  No known drug allergies.   SOCIAL HISTORY:  He does not smoke. He consumes one alcoholic beverage  per evening. He is married, but has  no children.   FAMILY HISTORY:  Negative for coronary artery disease in his immediate  family.   PAST MEDICAL HISTORY:  Significant for diabetes mellitus, hypertension,  and hyperlipidemia. He also has a history of diverticulitis. He has a  history of coronary disease as outlined in the HPI. He has had a prior  bowel resection, cholecystectomy, appendectomy, and tonsillectomy. He  has had a history of nephrolithiasis. He also has obstructive sleep  apnea. He has a history of migraine headaches and spondyl stenosis.   REVIEW OF SYSTEMS:  He denies any headaches, fevers, or chills. There is  no productive cough or hemoptysis. There is no dysphagia, odynophagia,  melena, or hematochezia. He does state that he has had some hematochezia  in the past, but attributes this to a hemorrhoid. There is no dysuria or  hematuria. There is no rash or seizure activity. There is no orthopnea,  PND, or pedal edema. The remaining systems are negative other than post-  nasal  drip.   PHYSICAL EXAMINATION:  VITAL SIGNS:  VITAL SIGNS:  Blood pressure  135/76, pulse 67, weight 223 pounds.  GENERAL:  Well developed, well nourished and in no acute distress.  SKIN:  Warm and dry.  He does not appear to be depressed.  There is no peripheral clubbing.  BACK:  Normal.  HEENT:  Normal with normal eyelids.  NECK:  Supple with a normal upstroke bilaterally and there are no bruits  noted. There is no jugular venous distension and no thyromegaly is  noted.  CHEST:  Clear to auscultation with normal expansion.  CARDIOVASCULAR:  Regular rate and rhythm with normal S1 and S2. There  are no murmurs, rubs, or gallops noted. I cannot palpate a PMI.  ABDOMEN:  Nontender. Positive bowel sounds noted. No hepatosplenomegaly  and no mass appreciated. There is no abdominal bruit. He has a 2+  femoral pulses bilaterally and no bruits.  EXTREMITIES:  No edema that I can palpate and no cords. He has 2+  posterior tibialis pulses bilaterally.  NEUROLOGIC:  Grossly intact.   His electrocardiogram shows a sinus rhythm at a rate of 66. There are  non-specific ST changes.   DIAGNOSES:  1. Preoperative evaluation prior to nasal surgery - I think that Mr.      [name] would be low risk given his recent normal Myoview at      [name] and [name], and based on his symptoms (he does      have some vague chest pain occasionally that does not sound      cardiac, but he does not have exertional chest pain). I have      explained that given his history of drug eluting stent that there      is a small risk of increased stent thrombosis when Plavix is      discontinued. However, it has been over two years since his stent      was placed and I therefore feel that the risk is somewhat small. If      he decides to proceed with surgery than the Plavix could be      discontinued 7 to 10 days prior to the procedure and he understands      that there is a small risk of stent thrombosis. We  would recommend      continuing his aspirin. We would recommend resuming his Plavix      after the surgery.  2. Coronary artery disease - he had a  recent negative Myoview and his      symptoms appear to be stable. He will continue on his aspirin,      Plavix, statin, beta blocker, and ACE inhibitor.  3. Hypertension - his blood pressure is adequately controlled on his      present medications.  4. Hyperlipidemia - he did have recent lipids checked on 09/27/2007.      His total cholesterol was 164 with an LDL of 96 and an HDL of 36.      We will continue with his present medications for now, but I will      refer him to the lipid clinic for further adjustment.  5. Diabetes mellitus - he will continue on his present medications and      follow up with his primary care physician concerning this issue.  6. History of diverticulitis.   I will see him back in approximately 6 months.     Madolyn Frieze Jens Som, MD, [facility]  Electronically Signed    BSC/MedQ  DD: 10/28/2007  DT: 10/29/2007  Job #: [id number]   cc:   Nilda Simmer, M.D.

## 2011-03-31 NOTE — Assessment & Plan Note (Signed)
[facility]                            CARDIOLOGY OFFICE NOTE   HADRIEL, NORTHUP                    MRN:          [id number]  DATE:05/02/2008                            DOB:          02-09-1946    Mr. Treadway is a very pleasant 65 year old gentleman who has a history of  coronary artery disease.  He has had previous PCI of his right coronary  artery using a Cypher drug-eluting stent in 2006.  His most recent  Myoview was performed at [facility] on June 30, 2007.  At that time, he was  to found have an ejection fraction of 63%.  There was no significant ischemia.  He is also followed in the Lipid  Clinic for hyperlipidemia.  Since I last saw him, he has done reasonably  well.  He did have Niaspan added to his medical regimen and developed  significant dyspnea.  He discontinued this and his symptoms are  improving.  He does have mild dyspnea at times.  He states that this is  similar to what he had before his intervention.  However, again after  stopping his Niaspan, this is slowly improving.  There is no orthopnea,  PND, or pedal edema.  He occasionally feels a vague pain in his chest  when he feels stressed, but he does not have exertional chest pain.   MEDICATIONS:  1. Lisinopril and hydrochlorothiazide 10/12.5 daily.  2. Amlodipine 5 daily.  3. Metformin 500 mg daily.  4. Propranolol ER 120 mg p.o. b.i.d.  5. Plavix 75 mg p.o. daily.  6. Imitrex as needed.  7. Lumigan eye drops.  8. Alphagan eye drops.  9. Lipitor 40 mg daily.  10.TriCor 145 mg daily.  11.Aspirin 81 mg daily.  12.Fish oil.  13.Multivitamin.  14.Vitamin D.   PHYSICAL EXAMINATION:  VITAL SIGNS:  Today shows a blood pressure of  119/73, his pulse is 58, and weight is 225 pounds.  HEENT:  Normal.  NECK:  Supple with no bruits.  CHEST:  Clear.  CARDIOVASCULAR:  Regular rate and rhythm.  ABDOMEN:  No tenderness.  EXTREMITIES:  No edema.   His  electrocardiogram shows a sinus rhythm at a rate of 56.  There are  nonspecific T-wave changes.  It is unchanged from previous.   DIAGNOSES:  1. Dyspnea - he has attributed this to Niaspan and it is slowly      improving off the medication.  However, he states that he did have      predominantly dyspnea with exertion with his previous coronary      disease.  I have asked him to continue to follow this.  If it      worsens, then we will may need to pursue a repeat stress test.      Note, a stress test in August 2008 showed no ischemia based on the      report.  2. History of coronary artery disease - he will continue on his      present medications to include aspirin, Plavix, statin, beta-      blocker,  and angiotensin-converting enzyme inhibitor.  3. Hypertension - his blood pressure is adequately controlled on his      present medications.  4. Hyperlipidemia - we will continue on the statin, fish oil, and      TriCor and this is being managed in our Lipid Clinic.  5. Diabetes mellitus - management per his primary care physician.  6. History of diverticulitis.   We will see him back in 6 months.     Madolyn Frieze Jens Som, MD, [facility]  Electronically Signed    BSC/MedQ  DD: 05/02/2008  DT: 05/03/2008  Job #: [id number]

## 2011-03-31 NOTE — Assessment & Plan Note (Signed)
[facility]                               LIPID CLINIC NOTE   JAREL, CUADRA                    MRN:          [id number]  DATE:12/15/2007                            DOB:          12/28/1945    [name] comes in today for followup of his hyperlipidemia therapy which  includes Lipitor 40 mg daily, TriCor 145 mg daily, and Niaspan 500 mg  daily.  He has been tolerating these just fine with no complaints of  muscle problems, aches, pains, and he has had no flushing reactions on  Niaspan.   His other medications are unchanged and they include: Lisinopril with  HCTZ, amlodipine, Metformin, propranolol, Plavix, Imitrex, Lumigan, and  Alphagan.   PHYSICAL EXAMINATION:  VITAL SIGNS:  Weight is 222 pounds, blood  pressure is 120/60, heart rate is 62.   LABORATORY DATA:  Includes total cholesterol 114, triglycerides 104, HDL  30.8, LDL 62.  Liver function tests are within normal limits.   ASSESSMENT:  Mr. Dooly has been tolerating his new triple-drug regimen.  His triglycerides are now at goal of less than 150.  His HDL has gone  down slightly and is not at goal of greater than 40.  His LDL is much  improved from 96 down to 62, and is now within the goal of less than 70.  He has been continuing to maintain a heart healthy diet and exercise as  tolerated.   PLAN:  We are going to continue with the Lipitor and TriCor and increase  Niaspan to 750 mg daily in the hopes of raising his HDL to above 40.  I  encouraged him to continue with his diet and exercise plan.  We are  going to follow up with him in about 6 months.  He will have his lab  work drawn at the urgent [facility] clinic at his request.  A  written prescription was given.  At that time and per his visit if his  lipid goals are met, at his request he would like to discontinue  followup with [place] and continue to be followed by his regular doctor,  Clydie Braun L. Hal Hope, M.D.  He was urged to call  us with any questions or  concerns in the meantime.      Charolotte Eke, PharmD  Electronically Signed      Rollene Rotunda, MD, [facility]  Electronically Signed   TP/MedQ  DD: 12/15/2007  DT: 12/15/2007  Job #: [id number]   cc:   Clydie Braun L. Hal Hope, M.D.

## 2011-04-03 NOTE — Discharge Summary (Signed)
NAME:  [name], [name]                   ACCOUNT NO.:  [id number]   MEDICAL RECORD NO.:  [id number]          PATIENT TYPE:  OIB   LOCATION:  6522                         FACILITY:  [facility]   PHYSICIAN:  Nanetta Batty, M.D.   DATE OF BIRTH:  02-21-1946   DATE OF ADMISSION:  07/23/2005  DATE OF DISCHARGE:  07/24/2005                                 DISCHARGE SUMMARY   DISCHARGE DIAGNOSES:  1.  Coronary artery disease status post cardiac catheterization with      intervention during this admission.  2.  Exertional dyspnea.  3.  Hypertension.  4.  Hyperlipidemia.  5.  Spinal stenosis with back arthritis.  6.  Diverticulitis.  7.  Arrhythmia/palpitations.  8.  Obstructive sleep apnea on CPAP machine.  9.  History of migraines.   HISTORY OF PRESENT ILLNESS:  A 65 year old gentleman who was seen by Dr.  Allyson Sabal in the office who complains of chest pain and shortness of breath.  We  ordered Cardiolite stress test and 2-D echocardiogram which did not reveal  any significant findings, but the patient continued experiencing exertional  chest fullness and shortness of breath and had a number of risk factors  including hypertension, hyperlipidemia and family history of heart disease.  He underwent cardiac catheterization at [facility] to outline  the coronary anatomy and excluded the possibility of underlying coronary  disease.  That diagnostic catheterization revealed a lesion of the RCA and  the patient was admitted to [facility] for procedure on 07/23/05.   HOSPITAL PROCEDURE/HOSPITAL COURSE:  Coronary artery intervention - RCA  stenting with reduction of the lesion from 95 to 0% was performed by Dr.  Allyson Sabal on 07/23/05.  The patient tolerated the procedure well and was  transferred to the unit in stable condition.  On day of his discharge he was  assessed by Dr. Allyson Sabal.  His vital signs were stable, normal.  His  laboratories revealed sodium 139, potassium 3.4, chloride 105, CO2  31,  glucose 156, BUN 15, creatinine 1.2.  Cardiac enzymes x1 were negative the  morning after the procedure.  CBC revealed white blood cell count 4.4,  hemoglobin 12.9, hematocrit 36.1, platelet count 167.  His hemoglobin A1c  was 6.7.   The patient is being discharged home in stable condition.   DISCHARGE MEDICATIONS:  1.  Inderal 120 mg b.i.d.  2.  Lisinopril 40 mg every day.  3.  Indapamide 1.25 mg every day.  4.  Tricor 145 mg every day.  5.  Lipitor 40 mg every day.  6.  Eye drops as before.  7.  Aspirin 81 mg every day.  8.  Plavix 75 mg every day.  9.  Fish oil daily 1000 mg.  10. Claritin 10 mg every day.  11. Ferrous sulfate 325 mg every day.   DISCHARGE DIET:  Low-salt, low-fat, low-cholesterol diet.   DISCHARGE INSTRUCTIONS:  No driving.  No heavy lifting greater than five  pounds for three days.  No strenuous activities.  He was advised to increase  activities slowly and he was allowed  to take a shower but no baths until  groin heals.   FOLLOWUP APPOINTMENT:  Scheduled with Dr. Allyson Sabal on 9/21 at 8:45 a.m.      Raymon Mutton, P.A.      Nanetta Batty, M.D.  Electronically Signed    MK/MEDQ  D:  07/24/2005  T:  07/24/2005  Job:  [id number]   cc:   [facility]

## 2011-04-03 NOTE — Cardiovascular Report (Signed)
NAME:  [name], [name]                   ACCOUNT NO.:  [id number]   MEDICAL RECORD NO.:  [id number]          PATIENT TYPE:  OIB   LOCATION:  2867                         FACILITY:  [facility]   PHYSICIAN:  Nanetta Batty, M.D.   DATE OF BIRTH:  Jul 12, 1946   DATE OF PROCEDURE:  07/23/2005  DATE OF DISCHARGE:                              CARDIAC CATHETERIZATION   PROCEDURE:  Percutaneous coronary intervention and stent placement.   Mr. Hendershott is a 65 year old gentleman referred by Dr. Katrinka Blazing for evaluation of  chest pain and shortness of breath.  He has positive family risk factors.  He had a negative Cardiolite, but catheterization reveals moderate segmental  proximal LAD disease with high-grade distal right disease at the crux.  He  presents now for PCI and stenting of his distal right coronary artery.   PROCEDURE DESCRIPTION:  The patient was brought to the second floor [facility] cardiac catheterization lab in a postabsorptive state.  He was  premedicated with p.o. Valium, IV Versed and fentanyl.  His right groin was  prepped and shaved in the usual sterile fashion.  Xylocaine 1% was used for  local anesthesia.  A 7 French sheath was inserted into the right femoral  artery using standard Seldinger technique.  A 6 French sheath was inserted  into the right femoral vein.  Isovue dye was used for the entirety of the  case.  Retrograde aortic pressure was monitored during the case.   Using a 7 [place] hockey stick with side holes guide catheter along with an  [id number] Asahi soft guidewire, and a 2.5 x 12 Quantum Maverick, PCI was  performed.  The patient received 4000 units of heparin intravenously and  Integrilin double bolus and infusion.  The ACT was 245.  The patient was on  aspirin, received an additional 300 mg of p.o. Plavix in addition to his  morning maintenance 75 mg dose.   The guide catheter was engaged to the proximal right and the wire was passed  across the lesions in the PDA.  The  2.5 x 12 Quantum was then carefully  positioned across the lesion and dilated two to three times at nominal  pressures.  Following this, a 2.5 x 18 Cypher drug-eluting stent was then  employed at 15 atmospheres and post-dilated with a 2.75 x 15 Quantum  Maverick at 16 atmospheres (2.8 mm), resulting in reduction of the 95%  distal RCA lesion at the crux to 0% residual with TIMI-3 flow.  Intracoronary nitroglycerin 200 mcg was administered.  The PLA branch was  preserved.   Using a 6 [place] guide catheter, angiography was performed of the LAD to  document the 50-60% segmental proximal stenosis.   The catheters were removed.  The sheaths were sewn securely in placed.  The  patient left the lab in stable condition.  He did require half an amp of  atropine during the case after balloon inflation initially because of  bradycardia, which quickly resolved.   Plans will be to remove the sheaths once the ACT falls below 150.  Integrilin will  be continued for 18 hours.  The patient will be treated with  aspirin and Plavix and hydrated overnight.  He will be discharged home in  the morning if he remains clinically stable, and we will see him back in the  office in one to two weeks for follow-up.  He left the lab in stable  condition.      Nanetta Batty, M.D.  Electronically Signed     JB/MEDQ  D:  07/23/2005  T:  07/24/2005  Job:  [id number]   cc:   [facility]   Cathlean Sauer. Katrinka Blazing, M.D.

## 2011-08-31 LAB — BASIC METABOLIC PANEL
CO2: 31
Chloride: 104
Creatinine, Ser: 0.94
GFR calc Af Amer: >60
Potassium: 3.7

## 2011-08-31 LAB — CBC
Hemoglobin: 14.1
MCHC: 34.6
RBC: 4.4

## 2011-10-26 ENCOUNTER — Ambulatory Visit (INDEPENDENT_AMBULATORY_CARE_PROVIDER_SITE_OTHER): Payer: Medicare Other | Admitting: Family Medicine

## 2011-10-26 DIAGNOSIS — E785 Hyperlipidemia, unspecified: Secondary | ICD-10-CM

## 2011-10-26 DIAGNOSIS — E119 Type 2 diabetes mellitus without complications: Secondary | ICD-10-CM

## 2011-10-26 DIAGNOSIS — G43109 Migraine with aura, not intractable, without status migrainosus: Secondary | ICD-10-CM

## 2012-01-05 DIAGNOSIS — H2510 Age-related nuclear cataract, unspecified eye: Secondary | ICD-10-CM | POA: Diagnosis not present

## 2012-01-05 DIAGNOSIS — H4011X0 Primary open-angle glaucoma, stage unspecified: Secondary | ICD-10-CM | POA: Diagnosis not present

## 2012-01-05 DIAGNOSIS — H53029 Refractive amblyopia, unspecified eye: Secondary | ICD-10-CM | POA: Diagnosis not present

## 2012-01-05 DIAGNOSIS — H04129 Dry eye syndrome of unspecified lacrimal gland: Secondary | ICD-10-CM | POA: Diagnosis not present

## 2012-01-15 ENCOUNTER — Other Ambulatory Visit: Payer: Self-pay | Admitting: Family Medicine

## 2012-01-23 ENCOUNTER — Telehealth: Payer: Self-pay | Admitting: Internal Medicine

## 2012-01-23 MED ORDER — DIVALPROEX SODIUM 250 MG PO DR TAB: 250.0000 mg | DELAYED_RELEASE_TABLET | Freq: Three times a day (TID) | ORAL | Status: DC

## 2012-01-23 MED ORDER — AMLODIPINE BESYLATE 10 MG PO TABS: 10.0000 mg | ORAL_TABLET | Freq: Every day | ORAL | Status: DC

## 2012-01-23 MED ORDER — LISINOPRIL 20 MG PO TABS: 20.0000 mg | ORAL_TABLET | Freq: Every day | ORAL | Status: DC

## 2012-01-23 NOTE — Telephone Encounter (Signed)
[facility]

## 2012-02-03 ENCOUNTER — Other Ambulatory Visit: Payer: Self-pay | Admitting: Family Medicine

## 2012-02-04 ENCOUNTER — Other Ambulatory Visit: Payer: Self-pay | Admitting: Family Medicine

## 2012-02-04 MED ORDER — SUMATRIPTAN SUCCINATE 50 MG PO TABS: 50.0000 mg | ORAL_TABLET | ORAL | Status: DC | PRN

## 2012-02-22 ENCOUNTER — Ambulatory Visit (INDEPENDENT_AMBULATORY_CARE_PROVIDER_SITE_OTHER): Payer: Medicare Other | Admitting: Family Medicine

## 2012-02-22 ENCOUNTER — Encounter: Payer: Self-pay | Admitting: Family Medicine

## 2012-02-22 VITALS — BP 139/76 | HR 64 | Temp 98.0°F | Resp 16 | Ht 72.0 in | Wt 226.0 lb

## 2012-02-22 DIAGNOSIS — I10 Essential (primary) hypertension: Secondary | ICD-10-CM | POA: Diagnosis not present

## 2012-02-22 DIAGNOSIS — E785 Hyperlipidemia, unspecified: Secondary | ICD-10-CM

## 2012-02-22 DIAGNOSIS — G43909 Migraine, unspecified, not intractable, without status migrainosus: Secondary | ICD-10-CM

## 2012-02-22 DIAGNOSIS — G43009 Migraine without aura, not intractable, without status migrainosus: Secondary | ICD-10-CM | POA: Diagnosis not present

## 2012-02-22 DIAGNOSIS — K141 Geographic tongue: Secondary | ICD-10-CM

## 2012-02-22 DIAGNOSIS — I2510 Atherosclerotic heart disease of native coronary artery without angina pectoris: Secondary | ICD-10-CM

## 2012-02-22 DIAGNOSIS — E119 Type 2 diabetes mellitus without complications: Secondary | ICD-10-CM | POA: Diagnosis not present

## 2012-02-22 DIAGNOSIS — G4733 Obstructive sleep apnea (adult) (pediatric): Secondary | ICD-10-CM

## 2012-02-22 MED ORDER — LISINOPRIL 20 MG PO TABS: ORAL_TABLET | ORAL | Status: DC

## 2012-02-22 NOTE — Progress Notes (Signed)
  Subjective:    Patient ID: [name], male    DOB: [birth year], 66 y.o.   MRN: [id number]  HPI  Patient presents for routine follow up of multiple medical problems.  HTN- Blood pressures trending up; 140-152/80-90  Type 2 DM-  Blood sugars are running 120's; last eye exam last month withour diabetic changes.  Macular degeneration-  Being followed by Dr. Dione Booze  Dyslipidemia- compliant with medications.  No regular exercise.  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   SH/ recent trip to [facility]  Review of Systems     Objective:   Physical Exam  Constitutional: He appears well-developed.  Neck: Neck supple. No thyromegaly present.  Cardiovascular: Normal rate, regular rhythm and normal heart sounds.   Pulmonary/Chest: Effort normal and breath sounds normal.  Abdominal: Soft. Bowel sounds are normal.  Neurological: He is alert.  Skin:       No lower extremity lesions     A1C 5.9     Assessment & Plan:   1. HTN (hypertension)  lisinopril (PRINIVIL,ZESTRIL) 20 MG tablet, Comprehensive metabolic panel  2. DM type 2 (diabetes mellitus, type 2)  POCT glycosylated hemoglobin (Hb A1C), Microalbumin, urine  3. Dyslipidemia  Lipid panel  4. Migraine headache    5. Geographic tongue    6. OBSTRUCTIVE SLEEP APNEA    7. CAD      Patent to check on cost of Januvia; as he can not tolerate Metformin Peridex dental rinse 30 cc BID; patient prefers hard copy of Rx Check routine labs

## 2012-02-23 LAB — MICROALBUMIN, URINE: Microalb, Ur: 1.06 mg/dL (ref 0.00–1.89)

## 2012-02-23 LAB — LIPID PANEL
HDL: 38 mg/dL — ABNORMAL LOW (ref >39)
LDL Cholesterol: 68 mg/dL (ref 0–99)
Total CHOL/HDL Ratio: 3.8 Ratio
Triglycerides: 198 mg/dL — ABNORMAL HIGH (ref <150)
VLDL: 40 mg/dL (ref 0–40)

## 2012-02-23 LAB — COMPREHENSIVE METABOLIC PANEL
AST: 19 U/L (ref 0–37)
Albumin: 4.3 g/dL (ref 3.5–5.2)
Alkaline Phosphatase: 67 U/L (ref 39–117)
BUN: 20 mg/dL (ref 6–23)
Creat: 1.01 mg/dL (ref 0.50–1.35)
Glucose, Bld: 142 mg/dL — ABNORMAL HIGH (ref 70–99)
Potassium: 4.1 mEq/L (ref 3.5–5.3)
Total Bilirubin: 0.4 mg/dL (ref 0.3–1.2)

## 2012-03-01 ENCOUNTER — Encounter: Payer: Self-pay | Admitting: Family Medicine

## 2012-03-05 ENCOUNTER — Other Ambulatory Visit: Payer: Self-pay | Admitting: Family Medicine

## 2012-03-05 MED ORDER — SITAGLIPTIN PHOSPHATE 100 MG PO TABS: 100.0000 mg | ORAL_TABLET | Freq: Every day | ORAL | Status: DC

## 2012-03-06 ENCOUNTER — Encounter: Payer: Self-pay | Admitting: *Deleted

## 2012-03-21 ENCOUNTER — Ambulatory Visit (INDEPENDENT_AMBULATORY_CARE_PROVIDER_SITE_OTHER): Payer: Medicare Other | Admitting: Family Medicine

## 2012-03-21 VITALS — BP 118/66 | HR 81 | Temp 97.9°F | Resp 18 | Ht 72.0 in | Wt 224.0 lb

## 2012-03-21 DIAGNOSIS — L0291 Cutaneous abscess, unspecified: Secondary | ICD-10-CM | POA: Diagnosis not present

## 2012-03-21 DIAGNOSIS — L0390 Cellulitis, unspecified: Secondary | ICD-10-CM

## 2012-03-21 MED ORDER — DOXYCYCLINE HYCLATE 100 MG PO TABS: 100.0000 mg | ORAL_TABLET | Freq: Two times a day (BID) | ORAL | Status: AC

## 2012-03-21 NOTE — Progress Notes (Signed)
This is a 66 year old retired [employer] fund[employer] who comes in with a recurrence of her rash she had about a year ago. The rash is located in the same area just below left collarbone. It's red, flat, and characterized by some burning. It started 3 days prior to this visit with redness which has been expanding. In addition he's developed a central white area which yielded some purulent material yesterday.  He was seen by a dermatologist a year ago and after being treated with both antifungal's antibacterial cream, and at bacterial, it eventually went away after a month.  Objective: Patient has a red circular rash with indefinite margins and central white 2 mm foci.  Assessment: Unusual recurrence of rash which appears to be a cellulitis.  Plan: Doxycycline  Patient call if not improving in 2-3 days  Patient is leaving the country for a [place] trip on May 22

## 2012-03-21 NOTE — Patient Instructions (Signed)

## 2012-05-23 ENCOUNTER — Ambulatory Visit: Payer: Medicare Other | Admitting: Family Medicine

## 2012-05-26 ENCOUNTER — Telehealth: Payer: Self-pay

## 2012-05-26 NOTE — Telephone Encounter (Signed)
I'm assuming patient is asking for his Imitrex, but can we please get medications names before asking for refills.

## 2012-05-26 NOTE — Telephone Encounter (Signed)
Pt states he needs another rx refill for his migrain medication and that [facility]'t [facility] please [facility] with rx [phone]

## 2012-05-26 NOTE — Telephone Encounter (Signed)
Patient needs a refill on his migraine meds. Can we do this?

## 2012-05-27 MED ORDER — DIVALPROEX SODIUM 250 MG PO DR TAB: 250.0000 mg | DELAYED_RELEASE_TABLET | Freq: Three times a day (TID) | ORAL | Status: DC

## 2012-05-27 NOTE — Telephone Encounter (Signed)
Notified pt Rx was sent to pharm. 

## 2012-05-27 NOTE — Telephone Encounter (Signed)
Rx done. 

## 2012-05-27 NOTE — Telephone Encounter (Signed)
Patient would like Divalproex sod (Depakote?) 250 mg 1 tab TID prn for migraines. Patient would like a 90 day supply.  Told patient that I'm not sure we can do that but I we would call him back and let him know the status of this refill request

## 2012-06-09 ENCOUNTER — Encounter: Payer: Self-pay | Admitting: Family Medicine

## 2012-06-09 ENCOUNTER — Ambulatory Visit (INDEPENDENT_AMBULATORY_CARE_PROVIDER_SITE_OTHER): Payer: Medicare Other | Admitting: Family Medicine

## 2012-06-09 VITALS — BP 147/84 | HR 75 | Temp 98.2°F | Resp 18 | Wt 219.0 lb

## 2012-06-09 DIAGNOSIS — E785 Hyperlipidemia, unspecified: Secondary | ICD-10-CM

## 2012-06-09 DIAGNOSIS — E119 Type 2 diabetes mellitus without complications: Secondary | ICD-10-CM

## 2012-06-09 DIAGNOSIS — I10 Essential (primary) hypertension: Secondary | ICD-10-CM

## 2012-06-09 LAB — POCT GLYCOSYLATED HEMOGLOBIN (HGB A1C): Hemoglobin A1C: 5.6

## 2012-06-09 MED ORDER — LISINOPRIL 20 MG PO TABS: 20.0000 mg | ORAL_TABLET | Freq: Every day | ORAL | Status: DC

## 2012-06-09 NOTE — Patient Instructions (Addendum)
Try cutting the Januvia in half to 50 mg and continue monitoring the BS with goal of < 130 -change divalproex to 2 in am and one at night to see if this reduces the headache at 2 am Increase the lisinopril to 20 bid to better control the BP

## 2012-06-09 NOTE — Progress Notes (Signed)
@[email or web address] @  Patient ID: [name] MRN: [id number], DOB: 1946-05-27, 66 y.o. Date of Encounter: 06/09/2012, 12:27 PM  Primary Physician: No primary provider on file.  Chief Complaint: HTN  HPI: 66 y.o. year old male with history below presents for hypertension follow up.  Also, needs diabetes follow up.   He has glaucoma and so he has regular eye checks.  Dr. Dione Booze His left great toe is numb Having headaches at 2 to3 am each night.  No change in pulse ox.  Headaches are on side of head that is uphill when sleeping.  Diet consists of No CP,  visual changes, or focal deficits.   No past medical history on file.   Home Meds: Prior to Admission medications   Medication Sig Start Date End Date Taking? Authorizing Provider  amLODipine (NORVASC) 10 MG tablet Take 1 tablet (10 mg total) by mouth daily. 01/23/12 01/22/13 Yes Rickard Patience, PA-C  aspirin 81 MG tablet Take 81 mg by mouth daily.   Yes Historical Provider, MD  atorvastatin (LIPITOR) 80 MG tablet TAKE 1 TABLET DAILY 01/15/12  Yes Marzella Schlein McClung, PA-C  brimonidine (ALPHAGAN) 0.15 % ophthalmic solution 1 drop 3 (three) times daily.   Yes Historical Provider, MD  Cholecalciferol (VITAMIN D-3 PO) Take by mouth.   Yes Historical Provider, MD  divalproex (DEPAKOTE) 250 MG DR tablet Take 1 tablet (250 mg total) by mouth 3 (three) times daily. 05/27/12 05/27/13 Yes Heather M Marte, PA-C  fish oil-omega-3 fatty acids 1000 MG capsule Take 2 g by mouth daily.   Yes Historical Provider, MD  gemfibrozil (LOPID) 600 MG tablet Take 600 mg by mouth 2 (two) times daily before a meal.   Yes Historical Provider, MD  latanoprost (XALATAN) 0.005 % ophthalmic solution 1 drop at bedtime.   Yes Historical Provider, MD  lisinopril (PRINIVIL,ZESTRIL) 20 MG tablet 1 1/2 tablets daily 02/22/12  Yes Dois Davenport, MD  meloxicam (MOBIC) 7.5 MG tablet Take 7.5 mg by mouth daily.   Yes Historical Provider, MD  oxyCODONE (OXY IR/ROXICODONE) 5 MG immediate release  tablet Take 5 mg by mouth every 4 (four) hours as needed.   Yes Historical Provider, MD  propranolol (INDERAL LA) 120 MG 24 hr capsule TAKE 1 CAPSULE TWICE A DAY 01/15/12  Yes Marzella Schlein McClung, PA-C  sitaGLIPtin (JANUVIA) 100 MG tablet Take 1 tablet (100 mg total) by mouth daily. 03/05/12 03/05/13 Yes Dois Davenport, MD  SUMAtriptan (IMITREX) 50 MG tablet Take 1 tablet (50 mg total) by mouth every 2 (two) hours as needed for migraine. 02/04/12 02/03/13 Yes Pattricia Boss, PA-C  Multiple Vitamin (MULTIVITAMIN) tablet Take 1 tablet by mouth daily.    Historical Provider, MD  OVER THE COUNTER MEDICATION Take by mouth 2 (two) times daily. Occutabs    Historical Provider, MD  PRESCRIPTION MEDICATION Place into the nose 3 (three) times daily. Lidocane nasal spray    Historical Provider, MD    Allergies:  Allergies  Allergen Reactions  . Amaryl     Gives pt migraines  . Hydrocodone     Does not have much affect  . Levofloxacin     intolerance  . Metformin And Related Diarrhea  . Metronidazole      intolerance    History   Social History  . Marital Status: Married    Spouse Name: N/A    Number of Children: N/A  . Years of Education: N/A   Occupational History  . Not on file.  Social History Main Topics  . Smoking status: Never Smoker   . Smokeless tobacco: Not on file  . Alcohol Use: Yes     ocas  . Drug Use: No  . Sexually Active: Not on file   Other Topics Concern  . Not on file   Social History Narrative  . No narrative on file     No family history on file.  Review of Systems: Constitutional: negative for chills, fever, night sweats, weight changes, or fatigue  HEENT: negative for vision changes, hearing loss, congestion, rhinorrhea, ST, epistaxis, or sinus pressure Cardiovascular: negative for chest pain, palpitations, or DOE Respiratory: negative for hemoptysis, wheezing, shortness of breath, or cough Abdominal: negative for abdominal pain, nausea, vomiting,  diarrhea, or constipation Dermatological: negative for rash Neurologic: negative for headache, dizziness, or syncope All other systems reviewed and are otherwise negative with the exception to those above and in the HPI.   Physical Exam: Blood pressure 147/84, pulse 75, temperature 98.2 F (36.8 C), temperature source Oral, resp. rate 18, weight 219 lb (99.338 kg)., There is no height on file to calculate BMI. General: Well developed, well nourished, in no acute distress. Head: Normocephalic, atraumatic, eyes without discharge, sclera non-icteric, nares are without discharge. Bilateral auditory canals clear, TM's are without perforation, pearly grey and translucent with reflective cone of light bilaterally. Oral cavity moist, posterior pharynx without exudate, erythema, peritonsillar abscess, or post nasal drip.  Neck: Supple. No thyromegaly. Full ROM. No lymphadenopathy. No carotid bruits. Lungs: Clear bilaterally to auscultation without wheezes, rales, or rhonchi. Breathing is unlabored. Heart: RRR with S1 S2. No murmurs, rubs, or gallops appreciated.  Abdomen: Soft, non-tender, non-distended with normoactive bowel sounds. No hepatosplenomegaly. No rebound/guarding. No obvious abdominal masses. Msk:  Strength and tone normal for age. Extremities/Skin: Warm and dry. No clubbing or cyanosis. No edema. No rashes or suspicious lesions. Distal pulses 2+ and equal bilaterally. Neuro: Alert and oriented X 3. Moves all extremities spontaneously. Gait is normal. CNII-XII grossly in tact. DTR 2+, cerebellar function intact. Rhomberg normal. Numbness left great toe Psych:  Responds to questions appropriately with a normal affect.  Mild erythema with dry rough skin inner left foot (mild)  Labs:  CMP pending  ASSESSMENT AND PLAN:  66 y.o. year old male with hypertension and diabetes  Try cutting the Januvia in half to 50 mg and continue monitoring the BS with goal of < 130 -change divalproex to 2  in am and one at night to see if this reduces the headache at 2 am Increase the lisinopril to 20 bid to better control the BP 1. Type 2 diabetes mellitus  POCT glycosylated hemoglobin (Hb A1C), Comprehensive metabolic panel, Lipid panel, lisinopril (PRINIVIL,ZESTRIL) 20 MG tablet  2. Hyperlipidemia  Comprehensive metabolic panel, Lipid panel  3. Hypertension  Comprehensive metabolic panel, Lipid panel, lisinopril (PRINIVIL,ZESTRIL) 20 MG tablet     Signed, [name], MD 06/09/2012 12:27 PM

## 2012-06-10 ENCOUNTER — Other Ambulatory Visit: Payer: Self-pay | Admitting: Family Medicine

## 2012-06-10 LAB — COMPREHENSIVE METABOLIC PANEL
ALT: 18 U/L (ref 0–53)
AST: 16 U/L (ref 0–37)
Albumin: 4.6 g/dL (ref 3.5–5.2)
Alkaline Phosphatase: 70 U/L (ref 39–117)
BUN: 15 mg/dL (ref 6–23)
CO2: 28 mEq/L (ref 19–32)
Calcium: 11.1 mg/dL — ABNORMAL HIGH (ref 8.4–10.5)
Chloride: 107 mEq/L (ref 96–112)
Creat: 0.93 mg/dL (ref 0.50–1.35)
Glucose, Bld: 124 mg/dL — ABNORMAL HIGH (ref 70–99)
Potassium: 4.5 mEq/L (ref 3.5–5.3)
Sodium: 144 mEq/L (ref 135–145)
Total Bilirubin: 0.5 mg/dL (ref 0.3–1.2)
Total Protein: 6.8 g/dL (ref 6.0–8.3)

## 2012-06-10 LAB — LIPID PANEL
Cholesterol: 141 mg/dL (ref 0–200)
HDL: 33 mg/dL — ABNORMAL LOW (ref >39)
LDL Cholesterol: 72 mg/dL (ref 0–99)
Total CHOL/HDL Ratio: 4.3 Ratio
Triglycerides: 179 mg/dL — ABNORMAL HIGH (ref <150)
VLDL: 36 mg/dL (ref 0–40)

## 2012-06-13 ENCOUNTER — Other Ambulatory Visit: Payer: Self-pay | Admitting: Family Medicine

## 2012-06-14 ENCOUNTER — Other Ambulatory Visit: Payer: Self-pay | Admitting: Internal Medicine

## 2012-06-15 ENCOUNTER — Other Ambulatory Visit: Payer: Self-pay | Admitting: Family Medicine

## 2012-06-15 ENCOUNTER — Other Ambulatory Visit: Payer: Medicare Other | Admitting: Family Medicine

## 2012-06-15 DIAGNOSIS — E785 Hyperlipidemia, unspecified: Secondary | ICD-10-CM

## 2012-06-15 DIAGNOSIS — I10 Essential (primary) hypertension: Secondary | ICD-10-CM

## 2012-06-15 MED ORDER — LISINOPRIL 20 MG PO TABS: 40.0000 mg | ORAL_TABLET | Freq: Every day | ORAL | Status: DC

## 2012-06-15 MED ORDER — GEMFIBROZIL 600 MG PO TABS: 600.0000 mg | ORAL_TABLET | ORAL | Status: DC

## 2012-06-15 NOTE — Progress Notes (Signed)
I reviewed labs and med list with patient  His BP was 130-80 and pulse was 60-  He is here for lab only

## 2012-06-16 LAB — PTH, INTACT AND CALCIUM
Calcium, Total (PTH): 11.2 mg/dL — ABNORMAL HIGH (ref 8.4–10.5)
PTH: 60.4 pg/mL (ref 14.0–72.0)

## 2012-06-17 ENCOUNTER — Telehealth: Payer: Self-pay

## 2012-06-17 NOTE — Telephone Encounter (Signed)
[facility]#[id number] pharmacy needs clarification on pt rx please contact with info [phone]

## 2012-06-20 ENCOUNTER — Encounter: Payer: Self-pay | Admitting: Family Medicine

## 2012-06-20 NOTE — Progress Notes (Signed)
Let patient know that labs are okay.

## 2012-06-20 NOTE — Telephone Encounter (Signed)
Called [facility] they were clarifying dose of the gemfibrozil, it was sent x2 and the 1 day/dose was cxl at pharmacy and dose of bid is correct.

## 2012-07-07 DIAGNOSIS — H4011X0 Primary open-angle glaucoma, stage unspecified: Secondary | ICD-10-CM | POA: Diagnosis not present

## 2012-07-07 DIAGNOSIS — H43819 Vitreous degeneration, unspecified eye: Secondary | ICD-10-CM | POA: Diagnosis not present

## 2012-07-07 DIAGNOSIS — H2510 Age-related nuclear cataract, unspecified eye: Secondary | ICD-10-CM | POA: Diagnosis not present

## 2012-07-07 DIAGNOSIS — E119 Type 2 diabetes mellitus without complications: Secondary | ICD-10-CM | POA: Diagnosis not present

## 2012-07-11 ENCOUNTER — Other Ambulatory Visit: Payer: Self-pay | Admitting: Internal Medicine

## 2012-09-01 ENCOUNTER — Ambulatory Visit (INDEPENDENT_AMBULATORY_CARE_PROVIDER_SITE_OTHER): Payer: Medicare Other | Admitting: Family Medicine

## 2012-09-01 ENCOUNTER — Encounter: Payer: Self-pay | Admitting: Family Medicine

## 2012-09-01 VITALS — BP 120/72 | HR 72 | Temp 98.1°F | Resp 16 | Ht 72.0 in | Wt 218.0 lb

## 2012-09-01 DIAGNOSIS — R519 Headache, unspecified: Secondary | ICD-10-CM

## 2012-09-01 DIAGNOSIS — E785 Hyperlipidemia, unspecified: Secondary | ICD-10-CM

## 2012-09-01 DIAGNOSIS — I10 Essential (primary) hypertension: Secondary | ICD-10-CM

## 2012-09-01 DIAGNOSIS — E119 Type 2 diabetes mellitus without complications: Secondary | ICD-10-CM

## 2012-09-01 DIAGNOSIS — R51 Headache: Secondary | ICD-10-CM | POA: Diagnosis not present

## 2012-09-01 DIAGNOSIS — M4800 Spinal stenosis, site unspecified: Secondary | ICD-10-CM

## 2012-09-01 LAB — LIPID PANEL
Cholesterol: 145 mg/dL (ref 0–200)
HDL: 32 mg/dL — ABNORMAL LOW (ref >39)
LDL Cholesterol: 85 mg/dL (ref 0–99)
Total CHOL/HDL Ratio: 4.5 Ratio
Triglycerides: 141 mg/dL (ref <150)
VLDL: 28 mg/dL (ref 0–40)

## 2012-09-01 LAB — COMPREHENSIVE METABOLIC PANEL
ALT: 18 U/L (ref 0–53)
AST: 16 U/L (ref 0–37)
Albumin: 4.4 g/dL (ref 3.5–5.2)
Alkaline Phosphatase: 59 U/L (ref 39–117)
BUN: 16 mg/dL (ref 6–23)
CO2: 26 mEq/L (ref 19–32)
Calcium: 10.7 mg/dL — ABNORMAL HIGH (ref 8.4–10.5)
Chloride: 105 mEq/L (ref 96–112)
Creat: 0.94 mg/dL (ref 0.50–1.35)
Glucose, Bld: 114 mg/dL — ABNORMAL HIGH (ref 70–99)
Potassium: 3.9 mEq/L (ref 3.5–5.3)
Sodium: 140 mEq/L (ref 135–145)
Total Bilirubin: 0.6 mg/dL (ref 0.3–1.2)
Total Protein: 6.2 g/dL (ref 6.0–8.3)

## 2012-09-01 LAB — GLUCOSE, POCT (MANUAL RESULT ENTRY): POC Glucose: 111 mg/dl — AB (ref 70–99)

## 2012-09-01 LAB — POCT GLYCOSYLATED HEMOGLOBIN (HGB A1C): Hemoglobin A1C: 5.5

## 2012-09-01 MED ORDER — LISINOPRIL 20 MG PO TABS: 40.0000 mg | ORAL_TABLET | Freq: Every day | ORAL | Status: DC

## 2012-09-01 MED ORDER — ATORVASTATIN CALCIUM 80 MG PO TABS: 80.0000 mg | ORAL_TABLET | Freq: Every day | ORAL | Status: DC

## 2012-09-01 MED ORDER — GEMFIBROZIL 600 MG PO TABS: 600.0000 mg | ORAL_TABLET | ORAL | Status: DC

## 2012-09-01 MED ORDER — SITAGLIPTIN PHOSPHATE 100 MG PO TABS: 100.0000 mg | ORAL_TABLET | Freq: Every day | ORAL | Status: DC

## 2012-09-01 MED ORDER — PROPRANOLOL HCL ER 120 MG PO CP24: 120.0000 mg | ORAL_CAPSULE | Freq: Two times a day (BID) | ORAL | Status: DC

## 2012-09-01 MED ORDER — SUMATRIPTAN SUCCINATE 50 MG PO TABS: 50.0000 mg | ORAL_TABLET | ORAL | Status: DC | PRN

## 2012-09-01 MED ORDER — OXYCODONE HCL 5 MG PO TABS: 5.0000 mg | ORAL_TABLET | Freq: Three times a day (TID) | ORAL | Status: DC | PRN

## 2012-09-01 MED ORDER — AMLODIPINE BESYLATE 10 MG PO TABS: 10.0000 mg | ORAL_TABLET | Freq: Every day | ORAL | Status: DC

## 2012-09-01 NOTE — Progress Notes (Signed)
66 yo man with  1. Headaches:  Food triggered (Lima beans, MSG, nuts, eating out).  The change in depakote to two in am and one in afternoon did not help.  Lifelong, worse as adult  2. Hypertension: blood pressures at home 130-140/73-75.  Early to midafternoon the blood pressure rises to 145/85 or so.  3. Diabetes:  Hasn't cut Januvia in half yet.  Blood sugar averaging 115.  Objective: NAD Fundi:  No hemorrhages Neck: no bruit Chest: clear Heart: regular, no murmur Feet:  Macerated areas left toe webspace IV-V  Slight decrease tibial side left great toe with filament testing  Results for orders placed in visit on 09/01/12  GLUCOSE, POCT (MANUAL RESULT ENTRY)      Component Value Range   POC Glucose 111 (*) 70 - 99 mg/dl  POCT GLYCOSYLATED HEMOGLOBIN (HGB A1C)      Component Value Range   Hemoglobin A1C 5.5     Assessment: doing well  Plan: refill meds Work on foot care.

## 2012-09-03 ENCOUNTER — Encounter: Payer: Self-pay | Admitting: Family Medicine

## 2012-09-03 ENCOUNTER — Ambulatory Visit (INDEPENDENT_AMBULATORY_CARE_PROVIDER_SITE_OTHER): Payer: Medicare Other | Admitting: Family Medicine

## 2012-09-03 VITALS — BP 120/68 | HR 60 | Temp 98.3°F | Resp 16 | Ht 73.0 in | Wt 216.4 lb

## 2012-09-03 DIAGNOSIS — L0291 Cutaneous abscess, unspecified: Secondary | ICD-10-CM | POA: Diagnosis not present

## 2012-09-03 DIAGNOSIS — L0390 Cellulitis, unspecified: Secondary | ICD-10-CM

## 2012-09-03 DIAGNOSIS — R21 Rash and other nonspecific skin eruption: Secondary | ICD-10-CM | POA: Diagnosis not present

## 2012-09-03 MED ORDER — MUPIROCIN 2 % EX OINT: TOPICAL_OINTMENT | Freq: Three times a day (TID) | CUTANEOUS | Status: DC

## 2012-09-03 MED ORDER — DOXYCYCLINE HYCLATE 100 MG PO TABS: 100.0000 mg | ORAL_TABLET | Freq: Two times a day (BID) | ORAL | Status: DC

## 2012-09-03 NOTE — Progress Notes (Signed)
66 yo with recurrent rash left supraclavicular area 5 cm in diameter with some vesiculations.  H/O staph infections.  This has recurred in the same place  O:  NAD  5 cm erythematous annular area left supraclavicular chest with some vesiculations  A:  Suspect staph  P:  Doxycycline 100 mg bid Refill bactroban ointment

## 2012-12-26 ENCOUNTER — Telehealth: Payer: Self-pay

## 2012-12-26 NOTE — Telephone Encounter (Signed)
PT USUALLY SEES DR Milus Glazier. NEEDS REFILLS ON DIABETIC SUPPLIES.  ACCU-CHEK  [facility] - [facility]  ACCU-CHEK  MULTICLIX LANCING DEVICE & LANCETS  [facility] [address]   BF

## 2012-12-27 MED ORDER — FREESTYLE SYSTEM KIT: 1.0000 | PACK | Status: DC | PRN

## 2012-12-27 MED ORDER — FINGERSTIX LANCETS MISC: Status: DC

## 2012-12-27 MED ORDER — GLUCOSE BLOOD VI STRP: ORAL_STRIP | Status: DC

## 2012-12-27 NOTE — Telephone Encounter (Signed)
I pended these for him, to print, please print and sign them, so I can fax to [facility] [address]

## 2012-12-27 NOTE — Telephone Encounter (Signed)
Rx's printed, signed, and at the TL desk.

## 2012-12-28 NOTE — Telephone Encounter (Signed)
They are faxed to [facility] [address].

## 2013-01-04 DIAGNOSIS — E119 Type 2 diabetes mellitus without complications: Secondary | ICD-10-CM | POA: Diagnosis not present

## 2013-01-04 DIAGNOSIS — H43819 Vitreous degeneration, unspecified eye: Secondary | ICD-10-CM | POA: Diagnosis not present

## 2013-01-04 DIAGNOSIS — H4011X0 Primary open-angle glaucoma, stage unspecified: Secondary | ICD-10-CM | POA: Diagnosis not present

## 2013-02-02 ENCOUNTER — Ambulatory Visit (INDEPENDENT_AMBULATORY_CARE_PROVIDER_SITE_OTHER): Payer: Medicare Other | Admitting: Family Medicine

## 2013-02-02 ENCOUNTER — Encounter: Payer: Self-pay | Admitting: Family Medicine

## 2013-02-02 VITALS — BP 126/76 | HR 64 | Temp 98.0°F | Resp 16 | Ht 72.0 in | Wt 218.8 lb

## 2013-02-02 DIAGNOSIS — C44221 Squamous cell carcinoma of skin of unspecified ear and external auricular canal: Secondary | ICD-10-CM | POA: Diagnosis not present

## 2013-02-02 DIAGNOSIS — E119 Type 2 diabetes mellitus without complications: Secondary | ICD-10-CM

## 2013-02-02 DIAGNOSIS — Z23 Encounter for immunization: Secondary | ICD-10-CM | POA: Diagnosis not present

## 2013-02-02 DIAGNOSIS — I10 Essential (primary) hypertension: Secondary | ICD-10-CM | POA: Diagnosis not present

## 2013-02-02 LAB — LIPID PANEL
Cholesterol: 118 mg/dL (ref 0–200)
HDL: 32 mg/dL — ABNORMAL LOW (ref >39)
LDL Cholesterol: 53 mg/dL (ref 0–99)
Total CHOL/HDL Ratio: 3.7 Ratio
Triglycerides: 163 mg/dL — ABNORMAL HIGH (ref <150)
VLDL: 33 mg/dL (ref 0–40)

## 2013-02-02 LAB — COMPREHENSIVE METABOLIC PANEL
ALT: 18 U/L (ref 0–53)
AST: 17 U/L (ref 0–37)
Albumin: 4.3 g/dL (ref 3.5–5.2)
Alkaline Phosphatase: 71 U/L (ref 39–117)
BUN: 16 mg/dL (ref 6–23)
CO2: 27 mEq/L (ref 19–32)
Calcium: 10.2 mg/dL (ref 8.4–10.5)
Chloride: 105 mEq/L (ref 96–112)
Creat: 1.02 mg/dL (ref 0.50–1.35)
Glucose, Bld: 125 mg/dL — ABNORMAL HIGH (ref 70–99)
Potassium: 4.3 mEq/L (ref 3.5–5.3)
Sodium: 141 mEq/L (ref 135–145)
Total Bilirubin: 0.5 mg/dL (ref 0.3–1.2)
Total Protein: 6.4 g/dL (ref 6.0–8.3)

## 2013-02-02 LAB — POCT GLYCOSYLATED HEMOGLOBIN (HGB A1C): Hemoglobin A1C: 5.5

## 2013-02-02 MED ORDER — LISINOPRIL 40 MG PO TABS: 40.0000 mg | ORAL_TABLET | Freq: Every day | ORAL | Status: DC

## 2013-02-02 MED ORDER — CHLORTHALIDONE 25 MG PO TABS: 25.0000 mg | ORAL_TABLET | Freq: Every day | ORAL | Status: DC

## 2013-02-02 NOTE — Progress Notes (Signed)
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 He is going to [place] in June. He's in today because of several issues: Blood pressure, diabetes, decreased hearing, and lesion on right auricle.  Patient went to [place] in December with his wife. Unfortunately she broke her foot, he developed a cold and had an episode of diverticulitis. Both are recovering now the patient says that his hearing is not as good as it used to be and the tinnitus is a little bit worse.  The last several months patient has noted a scaly nonhealing area on the. Her portion of his right auricle. There is no pain or itchiness but this area has failed to heal.  In addition patient's blood pressure has been elevated to 170 at home despite taking is lisinopril 20 mg twice a day, amlodipine 10 mg daily, and propranolol 120 twice a day he is also started taking some potassium supplements.  Diabetes checks have generally been a little bit over 1:30 and occasionally less than this. He's taking Januvia 100 mg daily now. He tried cutting back to 50 mg but the leisure wasn't well-controlled and he had trouble cutting the Januvia and half. No new lesions on feet, no paresthesias  Patient stopped taking his divalproex in December because she did not feel it was helping his migraines at all. He's not noticed any significant change since December  Objective: No acute distress, patient is overweight HEENT: 3 mm scaly superficial erosion on the superior portion of his right auricle, mild serous otitis media changes in his ear, normal oropharynx, normal fundi  Neck: Supple no adenopathy or thyromegaly Chest: Clear Heart: Regular no murmur, gallop, rub Results for orders placed in visit on 02/02/13  POCT GLYCOSYLATED HEMOGLOBIN (HGB A1C)      Result Value Range   Hemoglobin A1C 5.5       Assessment: Gradual increase in blood pressure which needs attention, blood sugar well controlled, 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

## 2013-03-13 ENCOUNTER — Telehealth: Payer: Self-pay

## 2013-03-13 NOTE — Telephone Encounter (Signed)
PT STATES HE WAS GIVEN LISINOPRIL AND CHLORPHALIDONE 25MG S AND HIS BLOOD PRESSURE HAS BEEN DURING GOOD AND DOWN TO 120/70 BUT HIS BLOOD SUGAR HAVE BEEN HIGHER AROUND 130 AND SOMETIMES 160.  PLEASE CALL AND ADVISE [phone]

## 2013-04-05 DIAGNOSIS — K5730 Diverticulosis of large intestine without perforation or abscess without bleeding: Secondary | ICD-10-CM | POA: Diagnosis not present

## 2013-04-05 DIAGNOSIS — Z86010 Personal history of colonic polyps: Secondary | ICD-10-CM | POA: Diagnosis not present

## 2013-05-25 ENCOUNTER — Ambulatory Visit (INDEPENDENT_AMBULATORY_CARE_PROVIDER_SITE_OTHER): Payer: PRIVATE HEALTH INSURANCE | Admitting: Family Medicine

## 2013-05-25 ENCOUNTER — Encounter: Payer: Self-pay | Admitting: Family Medicine

## 2013-05-25 VITALS — BP 104/60 | HR 63 | Temp 98.4°F | Resp 16 | Ht 72.0 in | Wt 211.8 lb

## 2013-05-25 DIAGNOSIS — E119 Type 2 diabetes mellitus without complications: Secondary | ICD-10-CM

## 2013-05-25 DIAGNOSIS — L989 Disorder of the skin and subcutaneous tissue, unspecified: Secondary | ICD-10-CM | POA: Diagnosis not present

## 2013-05-25 LAB — COMPREHENSIVE METABOLIC PANEL
ALT: 17 U/L (ref 0–53)
AST: 17 U/L (ref 0–37)
Albumin: 4.4 g/dL (ref 3.5–5.2)
Alkaline Phosphatase: 59 U/L (ref 39–117)
BUN: 18 mg/dL (ref 6–23)
CO2: 26 mEq/L (ref 19–32)
Calcium: 10.9 mg/dL — ABNORMAL HIGH (ref 8.4–10.5)
Chloride: 104 mEq/L (ref 96–112)
Creat: 0.98 mg/dL (ref 0.50–1.35)
Glucose, Bld: 101 mg/dL — ABNORMAL HIGH (ref 70–99)
Potassium: 3.7 mEq/L (ref 3.5–5.3)
Sodium: 139 mEq/L (ref 135–145)
Total Bilirubin: 0.6 mg/dL (ref 0.3–1.2)
Total Protein: 6.6 g/dL (ref 6.0–8.3)

## 2013-05-25 LAB — POCT GLYCOSYLATED HEMOGLOBIN (HGB A1C): Hemoglobin A1C: 5.7

## 2013-05-25 NOTE — Progress Notes (Signed)
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    He is here for diabetes recheck.   He notes some medial great toe numbness (as on last visit) bilaterally.  Also some erythema on medial heels.  He is taking 1/2 chlorthalidone now and BP is doing well  On the trip to [place], he took metformin with him and when he tried it, he did not have diarrhea.  He continued to take the Metformin here in [place] because the Januvia wasn't quite sufficient.  Objective:  NAD  Eyes:  Normal fundi Neck:  Supple, no adenopathy Chest:  Clear Heart:  Regular, no murmur Skin:  Small lesion on upper right auricle Feet:  No lesions Results for orders placed in visit on 05/25/13  POCT GLYCOSYLATED HEMOGLOBIN (HGB A1C)      Result Value Range   Hemoglobin A1C 5.7      Assessment:  Stable, no new problems. It appears that the metformin is tolerable and is not contraindicated this point. His persistently normal hemoglobin A1c is a very reassuring. Blood pressure also looks great.  Plan:  Diabetes - Plan: POCT glycosylated hemoglobin (Hb A1C), Comprehensive metabolic panel  Skin lesions, generalized will schedule surgical removal of lesion in next week  Signed, [name], MD

## 2013-05-30 ENCOUNTER — Encounter: Payer: Self-pay | Admitting: Family Medicine

## 2013-05-30 ENCOUNTER — Ambulatory Visit (INDEPENDENT_AMBULATORY_CARE_PROVIDER_SITE_OTHER): Payer: Medicare Other | Admitting: Family Medicine

## 2013-05-30 VITALS — BP 115/66 | HR 56 | Temp 98.3°F | Resp 16 | Ht 71.5 in | Wt 206.0 lb

## 2013-05-30 DIAGNOSIS — L708 Other acne: Secondary | ICD-10-CM | POA: Diagnosis not present

## 2013-05-30 DIAGNOSIS — H9209 Otalgia, unspecified ear: Secondary | ICD-10-CM

## 2013-05-30 DIAGNOSIS — L989 Disorder of the skin and subcutaneous tissue, unspecified: Secondary | ICD-10-CM

## 2013-05-30 NOTE — Progress Notes (Signed)
Persistent right ear lesion x 1 year, no change with effudex.  Superior part of right auricle  Objective: Sterile prep, 1% xylo with epi local 1/2 cm elliptical incision Removal of lesion 2 x 5-0 sutures to close No complications  Assessment:  Skin cncer, superficial  Plan:  Recheck Saturday Skin path

## 2013-06-03 ENCOUNTER — Ambulatory Visit (INDEPENDENT_AMBULATORY_CARE_PROVIDER_SITE_OTHER): Payer: Medicare Other | Admitting: *Deleted

## 2013-06-03 DIAGNOSIS — L989 Disorder of the skin and subcutaneous tissue, unspecified: Secondary | ICD-10-CM

## 2013-06-03 NOTE — Progress Notes (Signed)
  Subjective:    Patient ID: [name], male    DOB: [birth year], 67 y.o.   MRN: [id number]  HPI  Patient presents today to have 2 sutures removed from his right ear. Area appears well healed. Trace amount of dried blood behind the ear lobe.  Cleaned area with sterile water. After taking the first suture out the second seemed to look stressed and the area just below began to bleed.  Consulted Dr. Cleta Alberts who agreed it was too soon to take out the remaining stitch. Area cleaned and a band aid applied. Pt will return Tuesday for re evaluation.   Review of Systems     Objective:   Physical Exam        Assessment & Plan:

## 2013-06-05 ENCOUNTER — Telehealth: Payer: Self-pay

## 2013-06-05 NOTE — Telephone Encounter (Signed)
Patient calling back to speak to clinical TL or lab for lab results.   Best number: [phone]

## 2013-06-06 ENCOUNTER — Ambulatory Visit (INDEPENDENT_AMBULATORY_CARE_PROVIDER_SITE_OTHER): Payer: Medicare Other | Admitting: Physician Assistant

## 2013-06-06 VITALS — BP 118/72 | HR 62 | Temp 98.3°F | Resp 17 | Ht 72.5 in | Wt 208.0 lb

## 2013-06-06 DIAGNOSIS — Z4802 Encounter for removal of sutures: Secondary | ICD-10-CM

## 2013-06-06 NOTE — Telephone Encounter (Signed)
See labs 

## 2013-06-06 NOTE — Progress Notes (Signed)
  Subjective:    Patient ID: [name], male    DOB: [birth year], 67 y.o.   MRN: [id number]  HPI   [name] is a very pleasant 67 yr old male here for suture removal.  Previous notes reviewed.  Pt states he is doing well.  No pain or drainage from the area.     Review of Systems  All other systems reviewed and are negative.       Objective:   Physical Exam  Vitals reviewed. Constitutional: He is oriented to person, place, and time. He appears well-developed and well-nourished. No distress.  HENT:  Head: Normocephalic and atraumatic.  Ears:  Healed incision at superior aspect of right auricle; #1 suture removed without difficulty  Eyes: Conjunctivae are normal. No scleral icterus.  Pulmonary/Chest: Effort normal.  Neurological: He is alert and oriented to person, place, and time.  Skin: Skin is warm and dry.  Psychiatric: He has a normal mood and affect. His behavior is normal.          Assessment & Plan:  Visit for suture removal   [name] is a very pleasant 67 yr old male here for suture removal.  Suture removed without difficulty.  Wound is well healed.  No need for further follow up unless concerns arise.

## 2013-06-15 ENCOUNTER — Encounter (HOSPITAL_COMMUNITY): Payer: Self-pay | Admitting: *Deleted

## 2013-06-15 ENCOUNTER — Ambulatory Visit (HOSPITAL_COMMUNITY)
Admission: RE | Admit: 2013-06-15 | Discharge: 2013-06-15 | Disposition: A | Discharge status: Home / Self Care | Payer: Medicare Other | Source: Ambulatory Visit | Attending: Gastroenterology | Admitting: Gastroenterology

## 2013-06-15 ENCOUNTER — Encounter (HOSPITAL_COMMUNITY)
Admission: RE | Disposition: A | Discharge status: Home / Self Care | Payer: Self-pay | Source: Ambulatory Visit | Attending: Gastroenterology

## 2013-06-15 DIAGNOSIS — Z883 Allergy status to other anti-infective agents status: Secondary | ICD-10-CM | POA: Insufficient documentation

## 2013-06-15 DIAGNOSIS — Z1211 Encounter for screening for malignant neoplasm of colon: Secondary | ICD-10-CM | POA: Insufficient documentation

## 2013-06-15 DIAGNOSIS — Z888 Allergy status to other drugs, medicaments and biological substances status: Secondary | ICD-10-CM | POA: Insufficient documentation

## 2013-06-15 DIAGNOSIS — K644 Residual hemorrhoidal skin tags: Secondary | ICD-10-CM | POA: Insufficient documentation

## 2013-06-15 DIAGNOSIS — K648 Other hemorrhoids: Secondary | ICD-10-CM | POA: Insufficient documentation

## 2013-06-15 DIAGNOSIS — K5730 Diverticulosis of large intestine without perforation or abscess without bleeding: Secondary | ICD-10-CM | POA: Diagnosis not present

## 2013-06-15 DIAGNOSIS — D126 Benign neoplasm of colon, unspecified: Secondary | ICD-10-CM | POA: Insufficient documentation

## 2013-06-15 DIAGNOSIS — Z860100 Personal history of colon polyps, unspecified: Secondary | ICD-10-CM | POA: Insufficient documentation

## 2013-06-15 DIAGNOSIS — Z885 Allergy status to narcotic agent status: Secondary | ICD-10-CM | POA: Insufficient documentation

## 2013-06-15 DIAGNOSIS — K649 Unspecified hemorrhoids: Secondary | ICD-10-CM | POA: Diagnosis not present

## 2013-06-15 HISTORY — DX: Essential (primary) hypertension: I10

## 2013-06-15 HISTORY — PX: COLONOSCOPY: SHX5424

## 2013-06-15 HISTORY — DX: Adverse effect of unspecified anesthetic, initial encounter: T41.45XA

## 2013-06-15 HISTORY — DX: Other complications of anesthesia, initial encounter: T88.59XA

## 2013-06-15 HISTORY — DX: Malignant (primary) neoplasm, unspecified: C80.1

## 2013-06-15 HISTORY — DX: Unspecified osteoarthritis, unspecified site: M19.90

## 2013-06-15 HISTORY — DX: Atherosclerotic heart disease of native coronary artery without angina pectoris: I25.10

## 2013-06-15 HISTORY — DX: Type 2 diabetes mellitus without complications: E11.9

## 2013-06-15 HISTORY — DX: Headache: R51

## 2013-06-15 SURGERY — COLONOSCOPY
Anesthesia: Moderate Sedation

## 2013-06-15 MED ORDER — FENTANYL CITRATE 0.05 MG/ML IJ SOLN
INTRAMUSCULAR | Status: AC
  Filled 2013-06-15: qty 4

## 2013-06-15 MED ORDER — MIDAZOLAM HCL 5 MG/5ML IJ SOLN
INTRAMUSCULAR | Status: DC | PRN
  Administered 2013-06-15 (×3): 2 mg via INTRAVENOUS

## 2013-06-15 MED ORDER — SODIUM CHLORIDE 0.9 % IV SOLN
INTRAVENOUS | Status: DC
  Administered 2013-06-15: 14:00:00 via INTRAVENOUS

## 2013-06-15 MED ORDER — FENTANYL CITRATE 0.05 MG/ML IJ SOLN
INTRAMUSCULAR | Status: DC | PRN
  Administered 2013-06-15 (×2): 25 ug via INTRAVENOUS

## 2013-06-15 MED ORDER — MIDAZOLAM HCL 10 MG/2ML IJ SOLN
INTRAMUSCULAR | Status: AC
  Filled 2013-06-15: qty 4

## 2013-06-15 NOTE — Op Note (Signed)
[facility] [address] [place], [address]   OPERATIVE PROCEDURE REPORT  PATIENT: Tony, Bond  MR#: [id number] BIRTHDATE: 10-02-1946  GENDER: Male ENDOSCOPIST: Jeani Hawking, MD ASSISTANT:   Jamal Maes, RN Oletha Blend, technician PROCEDURE DATE: 06/15/2013 PROCEDURE:   Colonoscopy with snare polypectomy ASA CLASS:   Class III INDICATIONS: Personal history of polyps MEDICATIONS: Versed 6 mg IV and Fentanyl 50 mcg IV  DESCRIPTION OF PROCEDURE:   After the risks benefits and alternatives of the procedure were thoroughly explained, informed consent was obtained.  A digital rectal exam revealed no abnormalities of the rectum.    The Pentax Colonoscope [id number] endoscope was introduced through the anus  and advanced to the cecum, which was identified by both the appendix and ileocecal valve , No adverse events experienced.    The quality of the prep was good. .  The instrument was then slowly withdrawn as the colon was fully examined.     FINDINGS: A 3-4 mm sessile polyp was removed from the ascending colon with a cold snare.  Two 2-3 mm sessile transverse colon polyps were removed wtih a cold snare.  Scattered left sided dvierticula were identified.  No other abnormalities noted. The scope was then withdrawn from the patient and the procedure terminated.  COMPLICATIONS: There were no complications.  IMPRESSION: 1) Polyps. 2) Diverticula. 3) Int/Ext hemorrhoids.  RECOMMENDATIONS: 1) Await biopsy results. 2) Repeat the colonoscopy in 3-5 years.  _______________________________ eSignedJeani Hawking, MD 06/15/2013 2:58 PM

## 2013-06-15 NOTE — H&P (Signed)
  [name] HPI: In 2011 the patient was found to have multiple adenomas and he is here to undergo a repeat colonoscopy.  He was scheduled for the office, but then he brought a letter stating that he was a difficult intubation.  The outpatient endo unit does not have a ventilator, which was previously the case in 2011.  As a result his case was transferred to the hospital.  No past medical history on file.  No past surgical history on file.  No family history on file.  Social History:  reports that he has never smoked. He does not have any smokeless tobacco history on file. He reports that  drinks alcohol. He reports that he does not use illicit drugs.  Allergies:  Allergies  Allergen Reactions  . Amaryl     Gives pt migraines  . Hydrocodone     Does not have much affect  . Levofloxacin     intolerance  . Metformin And Related Diarrhea  . Metronidazole      intolerance    Medications: Scheduled: Continuous:  No results found for this or any previous visit (from the past 24 hour(s)).   No results found.  ROS:  As stated above in the HPI otherwise negative.  There were no vitals taken for this visit.    PE: Gen: NAD, Alert and Oriented HEENT:  [place]/AT, EOMI Neck: Supple, no LAD Lungs: CTA Bilaterally CV: RRR without M/G/R ABM: Soft, NTND, +BS Ext: No C/C/E  Assessment/Plan: 1) Personal history of polyps.  Plan: 1) Colonoscopy now.  Victory Dresden D 06/15/2013, 1:04 PM

## 2013-06-16 ENCOUNTER — Encounter (HOSPITAL_COMMUNITY): Payer: Self-pay | Admitting: Gastroenterology

## 2013-06-16 DIAGNOSIS — N200 Calculus of kidney: Secondary | ICD-10-CM | POA: Diagnosis not present

## 2013-06-20 DIAGNOSIS — N200 Calculus of kidney: Secondary | ICD-10-CM | POA: Diagnosis not present

## 2013-07-04 DIAGNOSIS — N200 Calculus of kidney: Secondary | ICD-10-CM | POA: Diagnosis not present

## 2013-07-04 DIAGNOSIS — E7489 Other specified disorders of carbohydrate metabolism: Secondary | ICD-10-CM | POA: Diagnosis not present

## 2013-07-05 ENCOUNTER — Telehealth: Payer: Self-pay

## 2013-07-05 DIAGNOSIS — I10 Essential (primary) hypertension: Secondary | ICD-10-CM

## 2013-07-05 NOTE — Telephone Encounter (Signed)
Dr. Elbert Ewings   Patient is requesting a change in meds wants to take less lisinopril (PRINIVIL,ZESTRIL) 40 MG tablet And more chlorthalidone (HYGROTON) 25 MG tablet   Dr. Vernie Ammons will be sending over his notes and should reference the above in his notes per patient.   [phone]

## 2013-07-06 MED ORDER — LISINOPRIL 20 MG PO TABS: 20.0000 mg | ORAL_TABLET | Freq: Every day | ORAL | Status: DC

## 2013-07-06 NOTE — Telephone Encounter (Signed)
Spoke with pt, advised message from Dr. L. Pt understood. 

## 2013-07-06 NOTE — Telephone Encounter (Signed)
I have reduced the lisinopril to 20 mg daily.  Continue the chlorthalidone 25 daily.  I am awaiting full consultation report.

## 2013-07-18 DIAGNOSIS — H4011X0 Primary open-angle glaucoma, stage unspecified: Secondary | ICD-10-CM | POA: Diagnosis not present

## 2013-07-18 DIAGNOSIS — E119 Type 2 diabetes mellitus without complications: Secondary | ICD-10-CM | POA: Diagnosis not present

## 2013-07-18 DIAGNOSIS — H43819 Vitreous degeneration, unspecified eye: Secondary | ICD-10-CM | POA: Diagnosis not present

## 2013-07-18 DIAGNOSIS — H2510 Age-related nuclear cataract, unspecified eye: Secondary | ICD-10-CM | POA: Diagnosis not present

## 2013-07-22 ENCOUNTER — Telehealth: Payer: Self-pay

## 2013-07-22 NOTE — Telephone Encounter (Signed)
Please clarify with the patient.  I don't see metformin on this patient's current or previous medication list.  What dose does he take?  How often?  The patient saw Dr. Milus Glazier in 05/2013 for diabetes follow-up.  The note does not indicate the follow-up plan.  I would recommend that he come in 08/2013. General recommendations are that people with diabetes should be seen every 3 months by their primary care provider for routine follow-up unless other plans are made.

## 2013-07-22 NOTE — Telephone Encounter (Signed)
Pt is out of his metformin rx and would like to know if he can have a refill or if he needs to recheck w/ dr. Milus Glazier Please call [phone]

## 2013-07-23 ENCOUNTER — Other Ambulatory Visit: Payer: Self-pay | Admitting: Family Medicine

## 2013-07-23 DIAGNOSIS — E119 Type 2 diabetes mellitus without complications: Secondary | ICD-10-CM

## 2013-07-23 MED ORDER — METFORMIN HCL 500 MG PO TABS
500.0000 mg | ORAL_TABLET | Freq: Every day | ORAL | Status: DC
Start: 2013-07-23 — End: 2013-08-01

## 2013-07-23 NOTE — Telephone Encounter (Signed)
Medicine called in

## 2013-07-23 NOTE — Telephone Encounter (Signed)
Called pt, advised Rx called in.

## 2013-07-23 NOTE — Telephone Encounter (Signed)
Spoke with pt, he is on Metformin 500mg  once daily. He states he should be on the medication list. He will follow up with Dr Milus Glazier in October as planned. He needs a 90 day supply sent to his pharmacy.

## 2013-08-01 MED ORDER — METFORMIN HCL ER 500 MG PO TB24: 500.0000 mg | ORAL_TABLET | Freq: Every day | ORAL | Status: DC

## 2013-08-01 NOTE — Addendum Note (Signed)
Addended by: Sheppard Plumber A on: 08/01/2013 10:46 AM   Modules accepted: Orders

## 2013-08-01 NOTE — Telephone Encounter (Signed)
Received fax from [facility] questioning whether pt needs the ER or IR meformin. I checked with pt and he has been taking the ER. I will re-send Rx as ER.

## 2013-08-29 ENCOUNTER — Other Ambulatory Visit: Payer: Self-pay | Admitting: Family Medicine

## 2013-09-02 ENCOUNTER — Telehealth: Payer: Self-pay

## 2013-09-02 NOTE — Telephone Encounter (Signed)
Pt is needing to talk about an rx that was called in recently but was called in wrong and wants to know if changed   [phone] Best number

## 2013-09-03 NOTE — Telephone Encounter (Signed)
Spoke with pt, he states the quantity of Propandalol capsules is only half of what he takes. He takes BID and it was sent in for one a day. Can we send in another RX with the correct amount? Please advise.

## 2013-09-04 MED ORDER — PROPRANOLOL HCL ER 120 MG PO CP24: 120.0000 mg | ORAL_CAPSULE | Freq: Two times a day (BID) | ORAL | Status: DC

## 2013-09-04 NOTE — Telephone Encounter (Signed)
Excellent.  Thank you.  I thought that may have been what had happened.

## 2013-09-04 NOTE — Telephone Encounter (Signed)
I do see that sig indicates once daily, but he was given #180 propranolol which is a 90 day supply at BID dosing.  Should have enough medication, despite the incorrect sig.  Continue taking BID.  He is due for OV

## 2013-09-04 NOTE — Telephone Encounter (Signed)
Pharmacy did not dispense #180, they gave him  #90, when they called to verify, I did tell them one daily, this was my error. I have sent in the additional #90 to complete his 90 day supply. Patient advised of error.

## 2013-09-05 ENCOUNTER — Ambulatory Visit (INDEPENDENT_AMBULATORY_CARE_PROVIDER_SITE_OTHER): Payer: Medicare Other | Admitting: *Deleted

## 2013-09-05 DIAGNOSIS — Z23 Encounter for immunization: Secondary | ICD-10-CM

## 2013-10-30 ENCOUNTER — Telehealth: Payer: Self-pay

## 2013-10-30 NOTE — Telephone Encounter (Addendum)
error 

## 2013-10-30 NOTE — Telephone Encounter (Signed)
Patient states that he wants to come in and have his cholesterol, blood sugar, and calcium levels checked. Does he need an order for this?  [phone]

## 2013-10-30 NOTE — Telephone Encounter (Signed)
We would be happy to check those for him, he does need a visit. He will see Dr Milus Glazier tomorrow at 4pm. He wants labs prior, unfortunately, we can not do this without an appointment scheduled. Advised patient, Dr Milus Glazier may order labs, if he needs to be fasting, he can come back, he agrees.

## 2013-10-31 ENCOUNTER — Ambulatory Visit (INDEPENDENT_AMBULATORY_CARE_PROVIDER_SITE_OTHER): Payer: Medicare Other | Admitting: Family Medicine

## 2013-10-31 VITALS — BP 122/70 | HR 64 | Temp 98.0°F | Resp 16 | Ht 72.25 in | Wt 213.0 lb

## 2013-10-31 DIAGNOSIS — R21 Rash and other nonspecific skin eruption: Secondary | ICD-10-CM

## 2013-10-31 DIAGNOSIS — E119 Type 2 diabetes mellitus without complications: Secondary | ICD-10-CM

## 2013-10-31 MED ORDER — METFORMIN HCL ER 500 MG PO TB24: 500.0000 mg | ORAL_TABLET | Freq: Every day | ORAL | Status: DC

## 2013-10-31 MED ORDER — CLOTRIMAZOLE-BETAMETHASONE 1-0.05 % EX CREA: 1.0000 "application " | TOPICAL_CREAM | Freq: Two times a day (BID) | CUTANEOUS | Status: DC

## 2013-10-31 NOTE — Progress Notes (Signed)
Subjective:    Patient ID: [name], male    DOB: January 28, 1946, 67 y.o.   MRN: [id number] This chart was scribed for Tony Sidle, MD by Valera Castle, ED Scribe. This patient was seen in room 10 and the patient's care was started at 8:46 PM.  Chief Complaint  Patient presents with   Follow-up    Cholesterol, blood sugar    HPI MEHRAN Bond is a 67 y.o. male who presents to the [facility] for a follow up for sugar level and cholesterol as well as a Rx refill for his Metformin. Pt has multiple medical h/o listed below HPI.   He reports he is going to check his blood sugar level tomorrow morning, but states he needs Dr. Milus Glazier to put in the order. He reports slight discoloration on the inside of bilateral ankles as well as some mild itching over his bilateral LE. He denies any other complaints.   He reports recent travel, stating he went on a trip, and that it went well.   PCP Tony Sidle, MD  Patient Active Problem List   Diagnosis Date Noted   HTN (hypertension) 02/22/2012   Dyslipidemia 02/22/2012   DM 10/14/2010   HYPERLIPIDEMIA 10/14/2010   OBSTRUCTIVE SLEEP APNEA 10/14/2010   MIGRAINE HEADACHE 10/14/2010   GLAUCOMA 10/14/2010   HYPERTENSION 10/14/2010   CAD 10/14/2010   DIVERTICULAR DISEASE 10/14/2010   DYSPNEA 10/14/2010   Past Medical History  Diagnosis Date   Complication of anesthesia    Coronary artery disease    Hypertension    Diabetes mellitus without complication    Chronic kidney disease     Hx of stones   Headache(784.0)     migraines   Cancer     skin ca   Arthritis    Past Surgical History  Procedure Laterality Date   Cardiac catheterization     Coronary angioplasty     Lithotripsy     Colonoscopy N/A 06/15/2013    Procedure: COLONOSCOPY;  Surgeon: Theda Belfast, MD;  Location: [facility];  Service: Endoscopy;  Laterality: N/A;   Allergies  Allergen Reactions   Amaryl     Gives pt migraines    Hydrocodone     Does not have much affect   Levofloxacin     intolerance   Metformin And Related Diarrhea   Metronidazole      intolerance   Prior to Admission medications   Medication Sig Start Date End Date Taking? Authorizing Provider  amLODipine (NORVASC) 10 MG tablet Take 1 tablet (10 mg total) by mouth daily. DUE FOR OFFICE VISIT 08/29/13  Yes Ryan Adria Devon, PA-C  aspirin 81 MG tablet Take 81 mg by mouth daily.   Yes Historical Provider, MD  atorvastatin (LIPITOR) 80 MG tablet Take 1 tablet (80 mg total) by mouth daily. 09/01/12  Yes Tony Sidle, MD  brimonidine (ALPHAGAN) 0.15 % ophthalmic solution 1 drop 3 (three) times daily.   Yes Historical Provider, MD  chlorthalidone (HYGROTON) 25 MG tablet Take 1 tablet (25 mg total) by mouth daily. 02/02/13  Yes Tony Sidle, MD  Cholecalciferol (VITAMIN D-3 PO) Take by mouth.   Yes Historical Provider, MD  Fingerstix Lancets MISC To check blood sugar once daily dx code 250.01 12/27/12  Yes Ryan M Dunn, PA-C  fish oil-omega-3 fatty acids 1000 MG capsule Take 2 g by mouth daily.   Yes Historical Provider, MD  glucose blood test strip Use as instructed 12/27/12  Yes Sondra Barges,  PA-C  glucose monitoring kit (FREESTYLE) monitoring kit 1 each by Does not apply route as needed for other. Needs Accucheck Avia meter to check glucose once daily dx 250.01 12/27/12  Yes Ryan M Dunn, PA-C  latanoprost (XALATAN) 0.005 % ophthalmic solution 1 drop at bedtime.   Yes Historical Provider, MD  lisinopril (PRINIVIL,ZESTRIL) 20 MG tablet Take 1 tablet (20 mg total) by mouth daily. 07/06/13  Yes Tony Sidle, MD  meloxicam (MOBIC) 7.5 MG tablet Take 7.5 mg by mouth as needed.    Yes Historical Provider, MD  metFORMIN (GLUCOPHAGE-XR) 500 MG 24 hr tablet Take 1 tablet (500 mg total) by mouth daily with breakfast. 08/01/13  Yes Tony Sidle, MD  Multiple Vitamin (MULTIVITAMIN) tablet Take 1 tablet by mouth daily.   Yes Historical Provider, MD  OVER THE  COUNTER MEDICATION Take by mouth 2 (two) times daily. Occutabs   Yes Historical Provider, MD  oxyCODONE (OXY IR/ROXICODONE) 5 MG immediate release tablet Take 1 tablet (5 mg total) by mouth every 8 (eight) hours as needed. 09/01/12  Yes Tony Sidle, MD  PRESCRIPTION MEDICATION Place into the nose 3 (three) times daily. Lidocane nasal spray   Yes Historical Provider, MD  propranolol ER (INDERAL LA) 120 MG 24 hr capsule Take 1 capsule (120 mg total) by mouth 2 (two) times daily. DUE FOR OFFICE VISIT. 09/04/13  Yes Tony Sidle, MD  sitaGLIPtin (JANUVIA) 100 MG tablet Take 1 tablet (100 mg total) by mouth daily. 09/01/12 09/01/13  Tony Sidle, MD  SUMAtriptan (IMITREX) 50 MG tablet Take 1 tablet (50 mg total) by mouth every 2 (two) hours as needed for migraine. 09/01/12 09/01/13  Tony Sidle, MD   History reviewed. No pertinent family history. History   Social History   Marital Status: Married    Spouse Name: N/A    Number of Children: N/A   Years of Education: N/A   Occupational History   Not on file.   Social History Main Topics   Smoking status: Never Smoker    Smokeless tobacco: Never Used   Alcohol Use: Yes     Comment: ocas   Drug Use: No   Sexual Activity: Not on file   Other Topics Concern   Not on file   Social History Narrative   No narrative on file    Review of Systems  Skin: Positive for color change (slight discoloration over inner bilateral ankles).       Mild itching over bilateral LE.        Objective:   Physical Exam Nursing note and vitals reviewed. Constitutional: Pt is oriented to person, place, and time. Pt appears well-developed and well-nourished. No distress.  HENT:  Head: Normocephalic and atraumatic.  Eyes: EOM are normal. Pupils are equal, round, and reactive to light.  Neck: Neck supple. No tracheal deviation present.  Cardiovascular: Normal rate, regular rhythm and normal heart sounds.  Exam reveals no gallop and no  friction rub. No murmur heard. Pulmonary/Chest: Effort normal and breath sounds normal. No respiratory distress. Pt has no wheezes. Pt has no rales.  Abdominal: Soft. Bowel sounds are normal. There is no tenderness. There is no rebound and no guarding.  Musculoskeletal: Normal range of motion.  Neurological: Pt is alert and oriented to person, place, and time.  Skin: Skin is warm and dry.  left lateral foot erythematous, scaly rash similar to right dorsal foot erythematous scaly rash.  Psychiatric: Pt has a normal mood and affect. Pt's behavior is normal.  BP 122/70   Pulse 64   Temp(Src) 98 F (36.7 C) (Oral)   Resp 16   Ht 6' 0.25" (1.835 m)   Wt 213 lb (96.616 kg)   BMI 28.69 kg/m2   SpO2 95%     Assessment & Plan:   Type 2 diabetes mellitus - Plan: metFORMIN (GLUCOPHAGE-XR) 500 MG 24 hr tablet, Lipid panel, Comprehensive metabolic panel, Hemoglobin A1c  Rash and nonspecific skin eruption - Plan: clotrimazole-betamethasone (LOTRISONE) cream  Signed, Tony Sidle, MD      I personally performed the services described in this documentation, which was scribed in my presence. The recorded information has been reviewed and is accurate.

## 2013-11-01 ENCOUNTER — Other Ambulatory Visit (INDEPENDENT_AMBULATORY_CARE_PROVIDER_SITE_OTHER): Payer: Medicare Other

## 2013-11-01 DIAGNOSIS — E782 Mixed hyperlipidemia: Secondary | ICD-10-CM

## 2013-11-01 DIAGNOSIS — E119 Type 2 diabetes mellitus without complications: Secondary | ICD-10-CM | POA: Diagnosis not present

## 2013-11-01 DIAGNOSIS — I10 Essential (primary) hypertension: Secondary | ICD-10-CM | POA: Diagnosis not present

## 2013-11-01 LAB — COMPREHENSIVE METABOLIC PANEL
ALT: 24 U/L (ref 0–53)
AST: 22 U/L (ref 0–37)
Albumin: 4.2 g/dL (ref 3.5–5.2)
Alkaline Phosphatase: 58 U/L (ref 39–117)
BUN: 16 mg/dL (ref 6–23)
CO2: 29 mEq/L (ref 19–32)
Calcium: 10.5 mg/dL (ref 8.4–10.5)
Chloride: 104 mEq/L (ref 96–112)
Creat: 0.95 mg/dL (ref 0.50–1.35)
Glucose, Bld: 115 mg/dL — ABNORMAL HIGH (ref 70–99)
Potassium: 3.9 mEq/L (ref 3.5–5.3)
Sodium: 141 mEq/L (ref 135–145)
Total Bilirubin: 0.6 mg/dL (ref 0.3–1.2)
Total Protein: 6.6 g/dL (ref 6.0–8.3)

## 2013-11-01 LAB — LIPID PANEL
Cholesterol: 119 mg/dL (ref 0–200)
HDL: 34 mg/dL — ABNORMAL LOW (ref >39)
LDL Cholesterol: 65 mg/dL (ref 0–99)
Total CHOL/HDL Ratio: 3.5 Ratio
Triglycerides: 101 mg/dL (ref <150)
VLDL: 20 mg/dL (ref 0–40)

## 2013-11-01 LAB — HEMOGLOBIN A1C
Hgb A1c MFr Bld: 5.7 % — ABNORMAL HIGH (ref <5.7)
Mean Plasma Glucose: 117 mg/dL — ABNORMAL HIGH (ref <117)

## 2013-11-01 NOTE — Progress Notes (Signed)
Patient here for labs only. 

## 2013-11-02 ENCOUNTER — Telehealth: Payer: Self-pay

## 2013-11-02 NOTE — Telephone Encounter (Signed)
Patient says he was seen recently by Dr Milus Glazier and he forgot to renew his rx for genofribrozil (?) 600 mg 1 tablet/day. Uses [facility].

## 2013-11-02 NOTE — Telephone Encounter (Signed)
Can we refill? 

## 2013-11-03 MED ORDER — GEMFIBROZIL 600 MG PO TABS: 600.0000 mg | ORAL_TABLET | Freq: Every day | ORAL | Status: DC

## 2013-11-03 NOTE — Telephone Encounter (Signed)
Sent in the Rx.

## 2013-11-22 DIAGNOSIS — R0789 Other chest pain: Secondary | ICD-10-CM | POA: Diagnosis not present

## 2013-11-22 DIAGNOSIS — E785 Hyperlipidemia, unspecified: Secondary | ICD-10-CM | POA: Diagnosis not present

## 2013-11-22 DIAGNOSIS — I209 Angina pectoris, unspecified: Secondary | ICD-10-CM | POA: Diagnosis not present

## 2013-11-22 DIAGNOSIS — I2510 Atherosclerotic heart disease of native coronary artery without angina pectoris: Secondary | ICD-10-CM | POA: Diagnosis not present

## 2013-11-24 ENCOUNTER — Other Ambulatory Visit: Payer: Self-pay | Admitting: Family Medicine

## 2013-11-27 ENCOUNTER — Other Ambulatory Visit: Payer: Self-pay

## 2013-11-27 DIAGNOSIS — I10 Essential (primary) hypertension: Secondary | ICD-10-CM

## 2013-11-27 DIAGNOSIS — I2510 Atherosclerotic heart disease of native coronary artery without angina pectoris: Secondary | ICD-10-CM | POA: Diagnosis not present

## 2013-11-27 DIAGNOSIS — R079 Chest pain, unspecified: Secondary | ICD-10-CM | POA: Diagnosis not present

## 2013-11-27 MED ORDER — CHLORTHALIDONE 25 MG PO TABS: 25.0000 mg | ORAL_TABLET | Freq: Every day | ORAL | Status: DC

## 2013-11-28 ENCOUNTER — Telehealth: Payer: Self-pay

## 2013-11-28 DIAGNOSIS — I10 Essential (primary) hypertension: Secondary | ICD-10-CM

## 2013-11-28 NOTE — Telephone Encounter (Signed)
Patient calling to say please do not fulfill the last rx request for propranolol he is coming in tomorrow to see his doctor

## 2013-11-28 NOTE — Telephone Encounter (Signed)
Dr L, we had advised pt on last RF due for OV and he did come in to see you in Dec. I don't see a specific discussion about BP meds in notes. OK to RF this, I've pended for 1 year as you did others at [place].

## 2013-11-29 ENCOUNTER — Encounter: Payer: Self-pay | Admitting: Family Medicine

## 2013-11-29 ENCOUNTER — Ambulatory Visit (INDEPENDENT_AMBULATORY_CARE_PROVIDER_SITE_OTHER): Payer: Medicare Other | Admitting: Family Medicine

## 2013-11-29 VITALS — BP 123/67 | HR 67 | Temp 97.7°F | Resp 16 | Ht 72.0 in | Wt 214.0 lb

## 2013-11-29 DIAGNOSIS — E119 Type 2 diabetes mellitus without complications: Secondary | ICD-10-CM | POA: Diagnosis not present

## 2013-11-29 DIAGNOSIS — I10 Essential (primary) hypertension: Secondary | ICD-10-CM | POA: Diagnosis not present

## 2013-11-29 MED ORDER — PROPRANOLOL HCL ER 60 MG PO CP24: 60.0000 mg | ORAL_CAPSULE | Freq: Two times a day (BID) | ORAL | Status: DC

## 2013-11-29 MED ORDER — METFORMIN HCL ER 500 MG PO TB24: ORAL_TABLET | ORAL | Status: DC

## 2013-11-29 MED ORDER — GLUCOSE BLOOD VI STRP: ORAL_STRIP | Status: DC

## 2013-11-29 NOTE — Progress Notes (Signed)
Patient ID: [name] MRN: [id number], DOB: 08-27-1946, 68 y.o. Date of Encounter: 11/29/2013, 10:43 AM  Primary Physician: Robyn Haber, MD  Chief Complaint: HTN  HPI: 68 y.o. year old male with history below presents for hypertension follow up.  He had a stress test with Dr. Einar Gip on Monday which he completed without problem.  He found that being off the inderal gave him much more energy.  The Inderal was initially prescribed for migraines 15 years ago in [place].   Diet consists of special elimination of lima beans, pistachio nuts, processed meats because these precipitate a migraine. No CP, HA, visual changes, or focal deficits.   Januvia now costs $300 a month Past Medical History  Diagnosis Date  . Complication of anesthesia   . Coronary artery disease   . Hypertension   . Diabetes mellitus without complication   . Chronic kidney disease     Hx of stones  . Headache(784.0)     migraines  . Cancer     skin ca  . Arthritis      Home Meds: Prior to Admission medications   Medication Sig Start Date End Date Taking? Authorizing Provider  amLODipine (NORVASC) 10 MG tablet Take 1 tablet (10 mg total) by mouth daily. DUE FOR OFFICE VISIT 08/29/13  Yes Ryan Lyn Hollingshead, PA-C  aspirin 81 MG tablet Take 81 mg by mouth daily.   Yes Historical Provider, MD  atorvastatin (LIPITOR) 80 MG tablet Take 1 tablet (80 mg total) by mouth daily. 09/01/12  Yes Robyn Haber, MD  brimonidine (ALPHAGAN) 0.15 % ophthalmic solution 1 drop 3 (three) times daily.   Yes Historical Provider, MD  chlorthalidone (HYGROTON) 25 MG tablet Take 1 tablet (25 mg total) by mouth daily. 11/27/13  Yes Robyn Haber, MD  Cholecalciferol (VITAMIN D-3 PO) Take by mouth.   Yes Historical Provider, MD  clotrimazole-betamethasone (LOTRISONE) cream Apply 1 application topically 2 (two) times daily. 10/31/13  Yes Robyn Haber, MD  Fingerstix Lancets MISC To check blood sugar once daily dx code 250.01  12/27/12  Yes Ryan M Dunn, PA-C  fish oil-omega-3 fatty acids 1000 MG capsule Take 2 g by mouth daily.   Yes Historical Provider, MD  gemfibrozil (LOPID) 600 MG tablet Take 1 tablet (600 mg total) by mouth daily. 11/03/13  Yes Mancel Bale, PA-C  glucose blood test strip Use as instructed 12/27/12  Yes Ryan M Dunn, PA-C  glucose monitoring kit (FREESTYLE) monitoring kit 1 each by Does not apply route as needed for other. Needs Accucheck Avia meter to check glucose once daily dx 250.01 12/27/12  Yes Ryan M Dunn, PA-C  latanoprost (XALATAN) 0.005 % ophthalmic solution 1 drop at bedtime.   Yes Historical Provider, MD  metFORMIN (GLUCOPHAGE-XR) 500 MG 24 hr tablet Take 1 tablet (500 mg total) by mouth daily with breakfast. 10/31/13  Yes Robyn Haber, MD  Multiple Vitamin (MULTIVITAMIN) tablet Take 1 tablet by mouth daily.   Yes Historical Provider, MD  OVER THE COUNTER MEDICATION Take by mouth 2 (two) times daily. Occutabs   Yes Historical Provider, MD  PRESCRIPTION MEDICATION Place into the nose 3 (three) times daily. Lidocane nasal spray   Yes Historical Provider, MD  propranolol ER (INDERAL LA) 120 MG 24 hr capsule Take 1 capsule (120 mg total) by mouth 2 (two) times daily. 11/24/13  Yes Robyn Haber, MD  sitaGLIPtin (JANUVIA) 100 MG tablet Take 1 tablet (100 mg total) by mouth daily. 09/01/12 11/29/13 Yes Robyn Haber, MD  SUMAtriptan (IMITREX) 50 MG tablet Take 1 tablet (50 mg total) by mouth every 2 (two) hours as needed for migraine. 09/01/12 11/29/13 Yes Robyn Haber, MD  meloxicam (MOBIC) 7.5 MG tablet Take 7.5 mg by mouth as needed.     Historical Provider, MD    Allergies:  Allergies  Allergen Reactions  . Amaryl     Gives pt migraines  . Hydrocodone     Does not have much affect  . Levofloxacin     intolerance  . Lisinopril     cough  . Metformin And Related Diarrhea  . Metronidazole      intolerance  . Skelaxin [Metaxalone]     History   Social History  . Marital  Status: Married    Spouse Name: N/A    Number of Children: N/A  . Years of Education: N/A   Occupational History  . Not on file.   Social History Main Topics  . Smoking status: Never Smoker   . Smokeless tobacco: Never Used  . Alcohol Use: Yes     Comment: ocas  . Drug Use: No  . Sexual Activity: Not on file   Other Topics Concern  . Not on file   Social History Narrative  . No narrative on file     No family history on file.  Review of Systems: Constitutional: negative for chills, fever, night sweats, weight changes, or fatigue  HEENT: negative for vision changes, hearing loss, congestion, rhinorrhea, ST, epistaxis, or sinus pressure Cardiovascular: negative for chest pain, palpitations, or DOE Respiratory: negative for hemoptysis, wheezing, shortness of breath, or cough Abdominal: negative for abdominal pain, nausea, vomiting, diarrhea, or constipation Dermatological: negative for rash Neurologic: negative for headache, dizziness, or syncope All other systems reviewed and are otherwise negative with the exception to those above and in the HPI.   Physical Exam: Blood pressure 123/67, pulse 67, temperature 97.7 F (36.5 C), resp. rate 16, height 6' (1.829 m), weight 214 lb (97.07 kg), SpO2 95.00%., Body mass index is 29.02 kg/(m^2). General: Well developed, well nourished, in no acute distress. Head: Normocephalic, atraumatic, eyes without discharge, sclera non-icteric, nares are without discharge. Bilateral auditory canals clear, TM's are without perforation, pearly grey and translucent with reflective cone of light bilaterally. Oral cavity moist, posterior pharynx without exudate, erythema, peritonsillar abscess, or post nasal drip.  Neck: Supple. No thyromegaly. Full ROM. No lymphadenopathy. No carotid bruits. Lungs: Clear bilaterally to auscultation without wheezes, rales, or rhonchi. Breathing is unlabored. Heart: RRR with S1 S2. No murmurs, rubs, or gallops  appreciated.  Abdomen: Soft, non-tender, non-distended with normoactive bowel sounds. No hepatosplenomegaly. No rebound/guarding. No obvious abdominal masses. Msk:  Strength and tone normal for age. Extremities/Skin: Warm and dry. No clubbing or cyanosis. No edema. No rashes or suspicious lesions. Distal pulses 2+ and equal bilaterally. Neuro: Alert and oriented X 3. Moves all extremities spontaneously. Gait is normal. CNII-XII grossly in tact. DTR 2+, cerebellar function intact. Rhomberg normal. Psych:  Responds to questions appropriately with a normal affect.   Labs:  CMP pending  ASSESSMENT AND PLAN:  68 y.o. year old male with Type 2 diabetes mellitus - Plan: metFORMIN (GLUCOPHAGE-XR) 500 MG 24 hr tablet  Continue to monitor blood sugar daily.  Break HCTZ 25 in half daily = 12.5 daily -  Signed, Robyn Haber, MD 11/29/2013 10:43 AM

## 2013-11-29 NOTE — Patient Instructions (Signed)
We are reducing the Inderal (propranolol to 60 mg) LA twice daily. Continue with 1/2 hydrochorothiazide daily (12.5 mg) Stop the Januvia and take the Metformin twice daily Continue to monitor the blood sugar every day.

## 2013-11-29 NOTE — Telephone Encounter (Signed)
Script was already sent to the pharmacy.

## 2013-12-02 ENCOUNTER — Telehealth: Payer: Self-pay

## 2013-12-02 NOTE — Telephone Encounter (Signed)
Just rec'd rx for propranalol in mail. He said the dosage should have been 60 mg, but he rec'd 120 mg xr.  He had seen dr L. Last wed and they discussed reducing the med to 60mg .

## 2013-12-04 NOTE — Telephone Encounter (Signed)
Please note that the order in the chart is for 60 mg LA

## 2013-12-04 NOTE — Telephone Encounter (Signed)
Dr L do we need to change Rx?

## 2013-12-04 NOTE — Telephone Encounter (Signed)
Called pt and notified the correct Rx for 60 mg was sent in. Advised pt to check w/pharm and have them check the Rx. They should resend the correct medication and not charge him for the one they sent in error. Pt agreed and will call me back if they don't have a record of correct Rx.

## 2013-12-05 MED ORDER — PROPRANOLOL HCL ER 60 MG PO CP24: 60.0000 mg | ORAL_CAPSULE | Freq: Two times a day (BID) | ORAL | Status: DC

## 2013-12-05 NOTE — Telephone Encounter (Signed)
Pt called stating the wrong dose of medication was called into pharmacy. He need 60 mg, 90 day supply, 2x daily propranolol hcl er haps 120 mg [phone]. Pt stated he seen Dr. Joseph Art

## 2013-12-06 MED ORDER — PROPRANOLOL HCL ER 60 MG PO CP24: 60.0000 mg | ORAL_CAPSULE | Freq: Two times a day (BID) | ORAL | Status: DC

## 2013-12-06 NOTE — Telephone Encounter (Signed)
Needs the correct dose of 60 mg, 90 day supply.  Reference number is [id number].  [facility]   Needs to override the 120 mg (extended release)   [phone]

## 2013-12-06 NOTE — Telephone Encounter (Signed)
Spoke to [name]. Resent the prescription for the propranolol 60mg . At his last visit Dr. Carlean Jews reduced dosage.

## 2013-12-08 ENCOUNTER — Telehealth: Payer: Self-pay

## 2013-12-08 NOTE — Telephone Encounter (Signed)
[facility] Medicare div sent form to be completed for Medicare re: DM testing supplies. I completed and put in Dr Lenn Cal box for signature.

## 2013-12-10 ENCOUNTER — Other Ambulatory Visit: Payer: Self-pay | Admitting: *Deleted

## 2013-12-10 DIAGNOSIS — I10 Essential (primary) hypertension: Secondary | ICD-10-CM

## 2013-12-10 MED ORDER — PROPRANOLOL HCL ER 60 MG PO CP24: 60.0000 mg | ORAL_CAPSULE | Freq: Two times a day (BID) | ORAL | Status: DC

## 2013-12-11 DIAGNOSIS — R079 Chest pain, unspecified: Secondary | ICD-10-CM | POA: Diagnosis not present

## 2013-12-11 DIAGNOSIS — I10 Essential (primary) hypertension: Secondary | ICD-10-CM | POA: Diagnosis not present

## 2013-12-11 DIAGNOSIS — I2510 Atherosclerotic heart disease of native coronary artery without angina pectoris: Secondary | ICD-10-CM | POA: Diagnosis not present

## 2013-12-22 ENCOUNTER — Other Ambulatory Visit: Payer: Self-pay | Admitting: Family Medicine

## 2013-12-26 ENCOUNTER — Telehealth: Payer: Self-pay

## 2013-12-26 NOTE — Telephone Encounter (Signed)
Patient knows that dr l will not be back for a while, but he would like to let him know that he would like dr L to investigate the [place] he is having to take [phone]

## 2013-12-27 NOTE — Telephone Encounter (Signed)
It appears that the patient does not have insurance and I would recommend that the patient go onto the [place] website and go to the patient assistance and a lot of times the drug companies will send him free or very cheap medications.  He will have to fill out income information and then we typically have to write a rx.

## 2013-12-27 NOTE — Telephone Encounter (Signed)
Called patient back in regards to this. He states the the medication has gone up in price and is now over $400. States that he could look into ordering it from [place] but would rather not. States that at one time he was put on 2 metformin a day , but that didn't seem to agree with him. Wanted to know what if anything could be done?

## 2013-12-28 NOTE — Telephone Encounter (Signed)
Lm for rtn call. [facility] has a patient assistance program on their website. Pt can apply for assistance paying for prescription.

## 2014-01-01 NOTE — Telephone Encounter (Signed)
Unable to reach letter sent to patient

## 2014-01-03 NOTE — Telephone Encounter (Signed)
Got another fax stating that the form they received was incomplete. Completed and put in PA box for signature.

## 2014-01-04 DIAGNOSIS — I2510 Atherosclerotic heart disease of native coronary artery without angina pectoris: Secondary | ICD-10-CM | POA: Diagnosis not present

## 2014-01-04 DIAGNOSIS — I209 Angina pectoris, unspecified: Secondary | ICD-10-CM | POA: Diagnosis not present

## 2014-01-04 DIAGNOSIS — I10 Essential (primary) hypertension: Secondary | ICD-10-CM | POA: Diagnosis not present

## 2014-01-04 DIAGNOSIS — I517 Cardiomegaly: Secondary | ICD-10-CM | POA: Diagnosis not present

## 2014-01-04 NOTE — Telephone Encounter (Signed)
Form signed.  In outgoing fax box

## 2014-01-11 ENCOUNTER — Other Ambulatory Visit: Payer: Self-pay | Admitting: Physician Assistant

## 2014-01-17 DIAGNOSIS — H4011X0 Primary open-angle glaucoma, stage unspecified: Secondary | ICD-10-CM | POA: Diagnosis not present

## 2014-01-17 DIAGNOSIS — H2510 Age-related nuclear cataract, unspecified eye: Secondary | ICD-10-CM | POA: Diagnosis not present

## 2014-01-26 ENCOUNTER — Telehealth: Payer: Self-pay

## 2014-01-26 NOTE — Telephone Encounter (Signed)
[facility] keeps sending the DM supplies Medicare form to be completed. I am refaxing the completed form again.

## 2014-01-29 ENCOUNTER — Other Ambulatory Visit: Payer: Self-pay | Admitting: Physician Assistant

## 2014-01-31 ENCOUNTER — Encounter: Payer: Self-pay | Admitting: Family Medicine

## 2014-02-01 ENCOUNTER — Ambulatory Visit: Payer: Medicare Other

## 2014-02-01 ENCOUNTER — Ambulatory Visit (HOSPITAL_COMMUNITY)
Admission: RE | Admit: 2014-02-01 | Discharge: 2014-02-01 | Disposition: A | Discharge status: Home / Self Care | Payer: Medicare Other | Source: Ambulatory Visit | Attending: Family Medicine | Admitting: Family Medicine

## 2014-02-01 ENCOUNTER — Ambulatory Visit (INDEPENDENT_AMBULATORY_CARE_PROVIDER_SITE_OTHER): Payer: Medicare Other | Admitting: Family Medicine

## 2014-02-01 VITALS — BP 120/78 | HR 71 | Temp 97.9°F | Resp 18 | Ht 72.0 in | Wt 213.0 lb

## 2014-02-01 DIAGNOSIS — M549 Dorsalgia, unspecified: Secondary | ICD-10-CM

## 2014-02-01 DIAGNOSIS — M79604 Pain in right leg: Secondary | ICD-10-CM

## 2014-02-01 DIAGNOSIS — M79609 Pain in unspecified limb: Secondary | ICD-10-CM | POA: Diagnosis not present

## 2014-02-01 NOTE — Progress Notes (Signed)
[facility] [address], [place] [address] [phone]  Date:  02/01/2014   Name:  [name]   DOB:  1946-09-28   MRN:  [id number]  PCP:  Robyn Haber, MD    Chief Complaint: Back Pain and Leg Pain   History of Present Illness:  [name] is a 68 y.o. very pleasant male patient who presents with the following:  He is here today with back pain. He has had back problems for about 30 years.  He has spinal stenosis and some arthritis. Once a year or so he will have some trouble with his back. His back flared up towards the end of January, and it has continued to bother him off and on since then.  He did not have any known acute injury- he may exacerbate his back simply by getting out of a chair, getting out of bed, bending over, etc.    He has some sciatica in his legs- usually in the right leg.  However this time he has pain in the groin area which has been present for 3 weeks.   It was very painful at first and he could not even bear weight at first.  However it has improved.   He does not have any groin numbness.  He has not had any incontinence of stool or urine.  No genital numbness/    He has tried rest, NSAIDs  He may have had an MRI maybe 10 years ago.  He has had some epidural injections but they did not give relief for more than a week or so, he stopped doing the,  He has not had surgery on his back He does not recall using any steroids in the past.  PT ha been helpful for him some of the time.    He does have DM which is under control.   He checks his glucose every morning.    Patient Active Problem List   Diagnosis Date Noted  . HTN (hypertension) 02/22/2012  . Dyslipidemia 02/22/2012  . DM 10/14/2010  . HYPERLIPIDEMIA 10/14/2010  . OBSTRUCTIVE SLEEP APNEA 10/14/2010  . MIGRAINE HEADACHE 10/14/2010  . GLAUCOMA 10/14/2010  . HYPERTENSION 10/14/2010  . CAD 10/14/2010  . DIVERTICULAR DISEASE 10/14/2010  . DYSPNEA 10/14/2010     Past Medical History  Diagnosis Date  . Complication of anesthesia   . Coronary artery disease   . Hypertension   . Diabetes mellitus without complication   . Chronic kidney disease     Hx of stones  . Headache(784.0)     migraines  . Cancer     skin ca  . Arthritis     Past Surgical History  Procedure Laterality Date  . Cardiac catheterization    . Coronary angioplasty    . Lithotripsy    . Colonoscopy N/A 06/15/2013    Procedure: COLONOSCOPY;  Surgeon: Beryle Beams, MD;  Location: [facility];  Service: Endoscopy;  Laterality: N/A;    History  Substance Use Topics  . Smoking status: Never Smoker   . Smokeless tobacco: Never Used  . Alcohol Use: Yes     Comment: ocas    History reviewed. No pertinent family history.  Allergies  Allergen Reactions  . Amaryl     Gives pt migraines  . Hydrocodone     Does not have much affect  . Levofloxacin     intolerance  . Lisinopril     cough  . Metformin And Related  Diarrhea  . Metronidazole      intolerance  . Skelaxin [Metaxalone]     Medication list has been reviewed and updated.  Current Outpatient Prescriptions on File Prior to Visit  Medication Sig Dispense Refill  . amLODipine (NORVASC) 10 MG tablet TAKE 1 TABLET DAILY (DUE FOR OFFICE VISIT)  30 tablet  0  . aspirin 81 MG tablet Take 81 mg by mouth daily.      Marland Kitchen atorvastatin (LIPITOR) 80 MG tablet TAKE 1 TABLET DAILY  90 tablet  1  . brimonidine (ALPHAGAN) 0.15 % ophthalmic solution 1 drop 3 (three) times daily.      . chlorthalidone (HYGROTON) 25 MG tablet Take 1 tablet (25 mg total) by mouth daily.  90 tablet  0  . Cholecalciferol (VITAMIN D-3 PO) Take by mouth.      . clotrimazole-betamethasone (LOTRISONE) cream Apply 1 application topically 2 (two) times daily.  30 g  2  . Fingerstix Lancets MISC To check blood sugar once daily dx code 250.01  100 each  3  . fish oil-omega-3 fatty acids 1000 MG capsule Take 2 g by mouth daily.      Marland Kitchen gemfibrozil  (LOPID) 600 MG tablet Take 1 tablet (600 mg total) by mouth daily.  90 tablet  1  . glucose blood test strip Use as instructed  100 each  6  . glucose monitoring kit (FREESTYLE) monitoring kit 1 each by Does not apply route as needed for other. Needs Accucheck Avia meter to check glucose once daily dx 250.01  1 each  0  . latanoprost (XALATAN) 0.005 % ophthalmic solution 1 drop at bedtime.      . metFORMIN (GLUCOPHAGE-XR) 500 MG 24 hr tablet bid  180 tablet  3  . Multiple Vitamin (MULTIVITAMIN) tablet Take 1 tablet by mouth daily.      Marland Kitchen OVER THE COUNTER MEDICATION Take by mouth 2 (two) times daily. Occutabs      . PRESCRIPTION MEDICATION Place into the nose 3 (three) times daily. Lidocane nasal spray      . propranolol ER (INDERAL LA) 60 MG 24 hr capsule Take 1 capsule (60 mg total) by mouth 2 (two) times daily.  180 capsule  3  . SUMAtriptan (IMITREX) 50 MG tablet Take 1 tablet (50 mg total) by mouth every 2 (two) hours as needed for migraine.  24 tablet  2  . meloxicam (MOBIC) 7.5 MG tablet Take 7.5 mg by mouth as needed.        No current facility-administered medications on file prior to visit.    Review of Systems:  As per HPI- otherwise negative. Marland Kitchen  Physical Examination: Filed Vitals:   02/01/14 1259  BP: 120/78  Pulse: 71  Temp: 97.9 F (36.6 C)  Resp: 18   Filed Vitals:   02/01/14 1259  Height: 6' (1.829 m)  Weight: 213 lb (96.616 kg)   Body mass index is 28.88 kg/(m^2). Ideal Body Weight: Weight in (lb) to have BMI = 25: 183.9  GEN: WDWN, NAD, Non-toxic, A & O x 3 HEENT: Atraumatic, Normocephalic. Neck supple. No masses, No LAD. Ears and Nose: No external deformity. CV: RRR, No M/G/R. No JVD. No thrill. No extra heart sounds. PULM: CTA B, no wheezes, crackles, rhonchi. No retractions. No resp. distress. No accessory muscle use. ABD: S, NT, ND. No rebound. No HSM. EXTR: No c/c/e NEURO Normal gait.  PSYCH: Normally interactive. Conversant. Not depressed or anxious  appearing.  Calm demeanor.  Groin: he  indicates the area in question on his right inner thigh.  No redness or rash.  No numbness. No scrotal swelling or tenderness. No inguinal hernia.   Limited spine flexion and extension normal bilateral LE strength, sensation and DTR.    [facility] reading (PRIMARY) by  Dr. Lorelei Pont. T spine:degenearative change L spine: degenerative change especially at C2/3  THORACIC SPINE - 2 VIEW  COMPARISON: Chest x-ray dated 04/29/2010 hand 06/09/2007  FINDINGS: There is no fracture, bone destruction, or other significant abnormality. Minimal osteophytes are present in the mid thoracic spine. These are unchanged.  There is increased left apical pleural thickening as compared to the prior chest x-rays.  IMPRESSION: No significant abnormality of the thoracic spine.  Increased left apical pleural thickening since the prior study. I cannot exclude a left apical mass.  LUMBAR SPINE - COMPLETE 4+ VIEW  COMPARISON: Radiograph dated 06/16/2013 and CT scan abdomen and pelvis dated 03/10/2010  FINDINGS: There is a minimal lumbar scoliosis. Bilateral renal calculi are noted.  The patient is degenerative disc disease at L3-4 and to a lesser degree at L1-2 and L2-3. There is partial lumbarization of the S1 segment. No spondylolisthesis or spondylolysis. Slight degenerative facet arthritis at L5-S1 on the right and at L2-3 bilaterally. No fractures or bone destruction.  IMPRESSION: Multilevel degenerative disc and joint disease as described. Slight progression of degenerative facet arthritis since 03/10/2010.  Multiple bilateral renal calculi.   Assessment and Plan: Leg pain, right - Plan: Lower Extremity Venous Duplex Right  Back pain - Plan: DG Lumbar Spine Complete, DG Thoracic Spine 2 View  Mr. Hritz is here today with chronic back pain, currently with a flare and some pain into his right groin. He is most concerned that he might have a blood clot. Will  arrange an ultrasound today.  He does not wish to use any prednisone or any other medications for pain at this time.    Signed Lamar Blinks, MD  Received ultrasound report- negative for any DVT.  Called and LMOM letting him know.

## 2014-02-01 NOTE — Progress Notes (Signed)
Right lower extremity venous duplex completed.  Right:  No evidence of DVT, superficial thrombosis, or Baker's cyst.  Left:  Negative for DVT in the common femoral vein.  

## 2014-02-08 ENCOUNTER — Ambulatory Visit (INDEPENDENT_AMBULATORY_CARE_PROVIDER_SITE_OTHER): Payer: Medicare Other | Admitting: Family Medicine

## 2014-02-08 ENCOUNTER — Telehealth: Payer: Self-pay | Admitting: Family Medicine

## 2014-02-08 ENCOUNTER — Encounter: Payer: Self-pay | Admitting: Family Medicine

## 2014-02-08 ENCOUNTER — Ambulatory Visit
Admission: RE | Admit: 2014-02-08 | Discharge: 2014-02-08 | Disposition: A | Discharge status: Home / Self Care | Payer: Medicare Other | Source: Ambulatory Visit | Attending: Family Medicine | Admitting: Family Medicine

## 2014-02-08 VITALS — BP 110/62 | HR 66 | Temp 98.2°F | Resp 16 | Ht 72.0 in | Wt 214.4 lb

## 2014-02-08 DIAGNOSIS — R911 Solitary pulmonary nodule: Secondary | ICD-10-CM

## 2014-02-08 DIAGNOSIS — R918 Other nonspecific abnormal finding of lung field: Secondary | ICD-10-CM

## 2014-02-08 DIAGNOSIS — R9389 Abnormal findings on diagnostic imaging of other specified body structures: Secondary | ICD-10-CM

## 2014-02-08 DIAGNOSIS — Z1389 Encounter for screening for other disorder: Secondary | ICD-10-CM

## 2014-02-08 DIAGNOSIS — R937 Abnormal findings on diagnostic imaging of other parts of musculoskeletal system: Secondary | ICD-10-CM

## 2014-02-08 DIAGNOSIS — M25559 Pain in unspecified hip: Secondary | ICD-10-CM

## 2014-02-08 DIAGNOSIS — I2510 Atherosclerotic heart disease of native coronary artery without angina pectoris: Secondary | ICD-10-CM | POA: Diagnosis not present

## 2014-02-08 DIAGNOSIS — E119 Type 2 diabetes mellitus without complications: Secondary | ICD-10-CM

## 2014-02-08 DIAGNOSIS — I700 Atherosclerosis of aorta: Secondary | ICD-10-CM | POA: Diagnosis not present

## 2014-02-08 LAB — COMPLETE METABOLIC PANEL WITH GFR
ALT: 30 U/L (ref 0–53)
AST: 20 U/L (ref 0–37)
Albumin: 4 g/dL (ref 3.5–5.2)
Alkaline Phosphatase: 53 U/L (ref 39–117)
BUN: 12 mg/dL (ref 6–23)
CO2: 28 mEq/L (ref 19–32)
Calcium: 10.3 mg/dL (ref 8.4–10.5)
Chloride: 106 mEq/L (ref 96–112)
Creat: 0.8 mg/dL (ref 0.50–1.35)
GFR, Est African American: >89 mL/min
GFR, Est Non African American: >89 mL/min
Glucose, Bld: 121 mg/dL — ABNORMAL HIGH (ref 70–99)
Potassium: 4 mEq/L (ref 3.5–5.3)
Sodium: 144 mEq/L (ref 135–145)
Total Bilirubin: 0.5 mg/dL (ref 0.2–1.2)
Total Protein: 6 g/dL (ref 6.0–8.3)

## 2014-02-08 LAB — HEMOGLOBIN A1C
Hgb A1c MFr Bld: 5.9 % — ABNORMAL HIGH (ref <5.7)
Mean Plasma Glucose: 123 mg/dL — ABNORMAL HIGH (ref <117)

## 2014-02-08 MED ORDER — PREDNISONE 20 MG PO TABS: 40.0000 mg | ORAL_TABLET | Freq: Every day | ORAL | Status: DC

## 2014-02-08 MED ORDER — IOHEXOL 300 MG/ML  SOLN
75.0000 mL | Freq: Once | INTRAMUSCULAR | Status: AC | PRN
  Administered 2014-02-08: 75 mL via INTRAVENOUS

## 2014-02-08 MED ORDER — CANAGLIFLOZIN 100 MG PO TABS: 100.0000 mg | ORAL_TABLET | Freq: Every day | ORAL | Status: DC

## 2014-02-08 NOTE — Telephone Encounter (Signed)
Called- I have called several times and needed to get in touch, so LMOM today. We need to follow-up his recent t spine films with a chest CT. I will order this.  Asked him to please give [place] a call.

## 2014-02-08 NOTE — Progress Notes (Addendum)
Subjective:    Patient ID: [name], male    DOB: 03/04/1946, 68 y.o.   MRN: [id number]  This chart was scribed for Tony Haber, MD by [name], ED Scribe.   Chief Complaint  Patient presents with   Follow-up    DM   Leg Pain    right side groin area    PCP: Tony Haber, MD   HPI  [name] is a 68 y.o. male who presents to office for a follow up appointment after seeing Dr. Lorelei Bond for a pain on the right side of his groin. He denies any changes in the pain since being seen. The Doppler US performed of the area was normal and negative for DVT. He states the problem started with his lower back at the end of January. He injured it three times within the past few months. The groin pain began after the back pain about three weeks ago. The pain came on suddenly after getting up from a reading chair. The pain in his groin becomes severe with standing for extended periods of time. He reports a history of spinal stenosis.   His medication was just recently increased in price to a 400 dollar co pay for his Januvia for a ninety day supply.   He just got back from [facility]. 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     Review of Systems  Review of Systems: Consitutional: No fever, chills, fatigue, night sweats, lymphadenopathy, or weight changes. Eyes: No visual changes, eye redness, or discharge. ENT/Mouth: Ears: No otalgia, tinnitus, hearing loss, discharge. Nose: No congestion, rhinorrhea, sinus pain, or epistaxis. Throat: No sore throat, post nasal drip, or teeth pain. Cardiovascular: No CP, palpitations, diaphoresis, DOE, edema, orthopnea, PND. Respiratory: No cough, hemoptysis, SOB, or wheezing. Gastrointestinal: No anorexia, dysphagia, reflux, pain, nausea, vomiting, hematemesis, diarrhea, constipation, BRBPR, or melena. Genitourinary: No dysuria, frequency, urgency, hematuria, incontinence, nocturia,  decreased urinary stream, discharge, impotence, or testicular pain/masses. Musculoskeletal: No decreased ROM, joint swelling, or weakness. Positive for right groin myalgias, back pain Skin: No rash, erythema, lesion changes, pain, warmth, jaundice, or pruritis. Neurological: No headache, dizziness, syncope, seizures, tremors, memory loss, coordination problems, or paresthesias. Psychological: No anxiety, depression, hallucinations, SI/HI. Endocrine: No fatigue, polydipsia, polyphagia, polyuria All other systems were reviewed and are otherwise negative.      Objective:   Physical Exam  General: Well-developed, well-nourished male in no acute distress; appearance consistent with age of record HENT: normocephalic; atraumatic Eyes: pupils equal, round and reactive to light; extraocular muscles intact Neck: supple Heart: regular rate and rhythm; no murmurs, rubs or gallops Lungs: clear to auscultation bilaterally Abdomen: soft; nondistended; nontender; no masses or hepatosplenomegaly; bowel sounds present Extremities: No deformity; full range of motion; pulses normal Neurologic: Awake, alert and oriented; motor function intact in all extremities and symmetric; no facial droop; slight decreased sensation on dorsum of feet bilaterally Musculoskeletal: Mildly positive right straight leg raising when supine (60). He seems to have normal range of motion of his hip which is mildly tender behind the right great trochanter. He did have a little bit of discomfort with extreme internal rotation of the hip by this examiner but does seem to be less so after repeating the maneuver. Skin: Warm and dry Psychiatric: Normal mood and affect  Results for orders placed in visit on 11/01/13  LIPID PANEL      Result Value Ref Range   Cholesterol 119  0 - 200 mg/dL   Triglycerides 101  <150 mg/dL   HDL 34 (*) >39 mg/dL   Total CHOL/HDL Ratio 3.5     VLDL 20  0 - 40 mg/dL   LDL Cholesterol 65  0 - 99 mg/dL    COMPREHENSIVE METABOLIC PANEL      Result Value Ref Range   Sodium 141  135 - 145 mEq/L   Potassium 3.9  3.5 - 5.3 mEq/L   Chloride 104  96 - 112 mEq/L   CO2 29  19 - 32 mEq/L   Glucose, Bld 115 (*) 70 - 99 mg/dL   BUN 16  6 - 23 mg/dL   Creat 0.95  0.50 - 1.35 mg/dL   Total Bilirubin 0.6  0.3 - 1.2 mg/dL   Alkaline Phosphatase 58  39 - 117 U/L   AST 22  0 - 37 U/L   ALT 24  0 - 53 U/L   Total Protein 6.6  6.0 - 8.3 g/dL   Albumin 4.2  3.5 - 5.2 g/dL   Calcium 10.5  8.4 - 10.5 mg/dL  HEMOGLOBIN A1C      Result Value Ref Range   Hemoglobin A1C 5.7 (*) <5.7 %   Mean Plasma Glucose 117 (*) <117 mg/dL      Assessment & Plan:   Type II or unspecified type diabetes mellitus without mention of complication, not stated as uncontrolled - Plan: HM Diabetes Foot Exam, Canagliflozin (INVOKANA) 100 MG TABS  Pain in joint, pelvic region and thigh - Plan: predniSONE (DELTASONE) 20 MG tablet  Abnormal chest x-ray - Plan: BUN, Creatinine, serum  Screening for nephropathy - Plan: BUN, Creatinine, serum    Tony Mannan, MD

## 2014-02-08 NOTE — Addendum Note (Signed)
Addended by: Yvette Rack on: 02/08/2014 04:58 PM   Modules accepted: Orders

## 2014-02-12 ENCOUNTER — Encounter: Payer: Self-pay | Admitting: Family Medicine

## 2014-03-21 ENCOUNTER — Other Ambulatory Visit: Payer: Self-pay | Admitting: Family Medicine

## 2014-03-22 NOTE — Telephone Encounter (Signed)
Dr L, you saw pt in March , but don't see HTN addressed. Can we give RFs?

## 2014-03-29 ENCOUNTER — Ambulatory Visit (INDEPENDENT_AMBULATORY_CARE_PROVIDER_SITE_OTHER): Payer: Medicare Other | Admitting: Family Medicine

## 2014-03-29 VITALS — BP 118/70 | HR 65 | Temp 98.2°F | Resp 16 | Ht 72.0 in | Wt 216.2 lb

## 2014-03-29 DIAGNOSIS — R599 Enlarged lymph nodes, unspecified: Secondary | ICD-10-CM | POA: Diagnosis not present

## 2014-03-29 DIAGNOSIS — R590 Localized enlarged lymph nodes: Secondary | ICD-10-CM

## 2014-03-29 DIAGNOSIS — I10 Essential (primary) hypertension: Secondary | ICD-10-CM

## 2014-03-29 MED ORDER — AMOXICILLIN-POT CLAVULANATE 875-125 MG PO TABS: 1.0000 | ORAL_TABLET | Freq: Two times a day (BID) | ORAL | Status: DC

## 2014-03-29 MED ORDER — AMLODIPINE BESYLATE 10 MG PO TABS: 10.0000 mg | ORAL_TABLET | Freq: Every day | ORAL | Status: DC

## 2014-03-29 NOTE — Progress Notes (Signed)
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 It is for several days. It's associated with a recurring sebaceous cyst that has opened up and then a bit sore lately over the parietal occipital area of his scalp.  Patient notes that his blood sugars been running 1:15 to 125.  Objective: Patient has mildly erythematous open sore about 3 mm in the occipitoparietal region.  There is a left posterior regular and tender mildly swollen node. There is no overlying erythema or fluctuance  Assessment:Hypertension - Plan: amLODipine (NORVASC) 10 MG tablet  Postauricular adenopathy - Plan: amoxicillin-clavulanate (AUGMENTIN) 875-125 MG per tablet  Signed, Robyn Haber, MD

## 2014-03-30 ENCOUNTER — Telehealth: Payer: Self-pay

## 2014-03-30 NOTE — Telephone Encounter (Signed)
Pt is wanting dr Joseph Art to refer him to dr Joya Salm

## 2014-03-30 NOTE — Telephone Encounter (Signed)
Dr Carlean Jews- I don't see a reference to any referral to the Dr. Joya Salm. Please advise.

## 2014-04-03 ENCOUNTER — Encounter: Payer: Self-pay | Admitting: Family Medicine

## 2014-04-10 ENCOUNTER — Encounter: Payer: Self-pay | Admitting: Family Medicine

## 2014-04-10 ENCOUNTER — Other Ambulatory Visit: Payer: Self-pay | Admitting: Physician Assistant

## 2014-04-17 NOTE — Telephone Encounter (Signed)
Labs faxed to Dr Karsten Ro at [facility] with confirmation.

## 2014-05-01 ENCOUNTER — Telehealth: Payer: Self-pay

## 2014-05-01 DIAGNOSIS — M549 Dorsalgia, unspecified: Secondary | ICD-10-CM

## 2014-05-01 NOTE — Telephone Encounter (Signed)
PT IS WANTING TO TALK WITH DR Joseph Art ABOUT SCHEDULING A REFERRAL TO DR Joya Salm

## 2014-05-02 NOTE — Telephone Encounter (Signed)
Referral placed. LMOM letting pt know 

## 2014-05-02 NOTE — Telephone Encounter (Signed)
Called him to see why he needs the referral, he indicates he is having low and mid back pain , states you are aware but have not treated. Patient does have an appt with you on 05/07/14

## 2014-05-07 ENCOUNTER — Encounter: Payer: Self-pay | Admitting: Family Medicine

## 2014-05-07 ENCOUNTER — Ambulatory Visit (INDEPENDENT_AMBULATORY_CARE_PROVIDER_SITE_OTHER): Payer: Medicare Other | Admitting: Family Medicine

## 2014-05-07 VITALS — BP 117/61 | HR 67 | Temp 98.1°F | Resp 16 | Ht 72.0 in | Wt 210.6 lb

## 2014-05-07 DIAGNOSIS — N200 Calculus of kidney: Secondary | ICD-10-CM

## 2014-05-07 DIAGNOSIS — I10 Essential (primary) hypertension: Secondary | ICD-10-CM

## 2014-05-07 DIAGNOSIS — E119 Type 2 diabetes mellitus without complications: Secondary | ICD-10-CM | POA: Diagnosis not present

## 2014-05-07 DIAGNOSIS — M545 Low back pain, unspecified: Secondary | ICD-10-CM

## 2014-05-07 LAB — POCT URINALYSIS DIPSTICK
Bilirubin, UA: NEGATIVE
Blood, UA: NEGATIVE
Glucose, UA: 1000
Ketones, UA: NEGATIVE
Leukocytes, UA: NEGATIVE
Nitrite, UA: NEGATIVE
Protein, UA: NEGATIVE
Spec Grav, UA: 1.015
Urobilinogen, UA: 0.2
pH, UA: 7

## 2014-05-07 LAB — CBC WITH DIFFERENTIAL/PLATELET
Basophils Absolute: 0.1 10*3/uL (ref 0.0–0.1)
Basophils Relative: 1 % (ref 0–1)
Eosinophils Absolute: 0.1 10*3/uL (ref 0.0–0.7)
Eosinophils Relative: 2 % (ref 0–5)
HCT: 42.4 % (ref 39.0–52.0)
Hemoglobin: 15 g/dL (ref 13.0–17.0)
Lymphocytes Relative: 30 % (ref 12–46)
Lymphs Abs: 1.6 10*3/uL (ref 0.7–4.0)
MCH: 31.8 pg (ref 26.0–34.0)
MCHC: 35.4 g/dL (ref 30.0–36.0)
MCV: 89.8 fL (ref 78.0–100.0)
Monocytes Absolute: 0.5 10*3/uL (ref 0.1–1.0)
Monocytes Relative: 10 % (ref 3–12)
Neutro Abs: 3 10*3/uL (ref 1.7–7.7)
Neutrophils Relative %: 57 % (ref 43–77)
Platelets: 224 10*3/uL (ref 150–400)
RBC: 4.72 MIL/uL (ref 4.22–5.81)
RDW: 13.8 % (ref 11.5–15.5)
WBC: 5.3 10*3/uL (ref 4.0–10.5)

## 2014-05-07 LAB — COMPLETE METABOLIC PANEL WITH GFR
ALT: 27 U/L (ref 0–53)
AST: 23 U/L (ref 0–37)
Albumin: 4.7 g/dL (ref 3.5–5.2)
Alkaline Phosphatase: 50 U/L (ref 39–117)
BUN: 21 mg/dL (ref 6–23)
CO2: 26 mEq/L (ref 19–32)
Calcium: 10.7 mg/dL — ABNORMAL HIGH (ref 8.4–10.5)
Chloride: 105 mEq/L (ref 96–112)
Creat: 0.98 mg/dL (ref 0.50–1.35)
GFR, Est African American: >89 mL/min
GFR, Est Non African American: 79 mL/min
Glucose, Bld: 118 mg/dL — ABNORMAL HIGH (ref 70–99)
Potassium: 3.6 mEq/L (ref 3.5–5.3)
Sodium: 141 mEq/L (ref 135–145)
Total Bilirubin: 0.6 mg/dL (ref 0.2–1.2)
Total Protein: 6.8 g/dL (ref 6.0–8.3)

## 2014-05-07 LAB — POCT GLYCOSYLATED HEMOGLOBIN (HGB A1C): Hemoglobin A1C: 5.8

## 2014-05-07 LAB — URIC ACID: Uric Acid, Serum: 5.2 mg/dL (ref 4.0–7.8)

## 2014-05-07 LAB — GLUCOSE, POCT (MANUAL RESULT ENTRY): POC Glucose: 109 mg/dl — AB (ref 70–99)

## 2014-05-07 NOTE — Progress Notes (Signed)
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  Dr. Karsten Ro then treated him with lithotripsy.  Currently not having pain.  Objective:  NAD No flank pain  Note:  Decreased sensation on feet with monofilament  Results for orders placed in visit on 05/07/14  GLUCOSE, POCT (MANUAL RESULT ENTRY)      Result Value Ref Range   POC Glucose 109 (*) 70 - 99 mg/dl  POCT GLYCOSYLATED HEMOGLOBIN (HGB A1C)      Result Value Ref Range   Hemoglobin A1C 5.8    POCT URINALYSIS DIPSTICK      Result Value Ref Range   Color, UA yellow     Clarity, UA clear     Glucose, UA 1000     Bilirubin, UA neg     Ketones, UA neg     Spec Grav, UA 1.015     Blood, UA neg     pH, UA 7.0     Protein, UA neg     Urobilinogen, UA 0.2     Nitrite, UA neg     Leukocytes, UA Negative     Assessment: diabetes, controlled.  Possible kidney stone with risk of chlorthalidone use.  Plan:  Stop the chlorthalidone and follow BP at home. Nephrolithiasis - Plan: Uric Acid, POCT urinalysis dipstick  DM - Plan: POCT glucose (manual entry), POCT glycosylated hemoglobin (Hb A1C), HM Diabetes Foot Exam  Essential hypertension - Plan: CBC with Differential, COMPLETE METABOLIC PANEL WITH GFR  Left low back pain, with sciatica presence unspecified - Plan: Ambulatory referral to Neurosurgery  Signed, Robyn Haber, MD

## 2014-05-07 NOTE — Patient Instructions (Addendum)
We want a blood pressure reading < 130/80 ideally.   Stop the chlorthalidone  Kidney Stones Kidney stones (urolithiasis) are deposits that form inside your kidneys. The intense pain is caused by the stone moving through the urinary tract. When the stone moves, the ureter goes into spasm around the stone. The stone is usually passed in the urine.  CAUSES   A disorder that makes certain neck glands produce too much parathyroid hormone (primary hyperparathyroidism).  A buildup of uric acid crystals, similar to gout in your joints.  Narrowing (stricture) of the ureter.  A kidney obstruction present at birth (congenital obstruction).  Previous surgery on the kidney or ureters.  Numerous kidney infections. SYMPTOMS   Feeling sick to your stomach (nauseous).  Throwing up (vomiting).  Blood in the urine (hematuria).  Pain that usually spreads (radiates) to the groin.  Frequency or urgency of urination. DIAGNOSIS   Taking a history and physical exam.  Blood or urine tests.  CT scan.  Occasionally, an examination of the inside of the urinary bladder (cystoscopy) is performed. TREATMENT   Observation.  Increasing your fluid intake.  Extracorporeal shock wave lithotripsy--This is a noninvasive procedure that uses shock waves to break up kidney stones.  Surgery may be needed if you have severe pain or persistent obstruction. There are various surgical procedures. Most of the procedures are performed with the use of small instruments. Only small incisions are needed to accommodate these instruments, so recovery time is minimized. The size, location, and chemical composition are all important variables that will determine the proper choice of action for you. Talk to your health care provider to better understand your situation so that you will minimize the risk of injury to yourself and your kidney.  HOME CARE INSTRUCTIONS   Drink enough water and fluids to keep your urine clear or  pale yellow. This will help you to pass the stone or stone fragments.  Strain all urine through the provided strainer. Keep all particulate matter and stones for your health care provider to see. The stone causing the pain may be as small as a grain of salt. It is very important to use the strainer each and every time you pass your urine. The collection of your stone will allow your health care provider to analyze it and verify that a stone has actually passed. The stone analysis will often identify what you can do to reduce the incidence of recurrences.  Only take over-the-counter or prescription medicines for pain, discomfort, or fever as directed by your health care provider.  Make a follow-up appointment with your health care provider as directed.  Get follow-up X-rays if required. The absence of pain does not always mean that the stone has passed. It may have only stopped moving. If the urine remains completely obstructed, it can cause loss of kidney function or even complete destruction of the kidney. It is your responsibility to make sure X-rays and follow-ups are completed. Ultrasounds of the kidney can show blockages and the status of the kidney. Ultrasounds are not associated with any radiation and can be performed easily in a matter of minutes. SEEK MEDICAL CARE IF:  You experience pain that is progressive and unresponsive to any pain medicine you have been prescribed. SEEK IMMEDIATE MEDICAL CARE IF:   Pain cannot be controlled with the prescribed medicine.  You have a fever or shaking chills.  The severity or intensity of pain increases over 18 hours and is not relieved by pain medicine.  You develop a new onset of abdominal pain.  You feel faint or pass out.  You are unable to urinate. MAKE SURE YOU:   Understand these instructions.  Will watch your condition.  Will get help right away if you are not doing well or get worse. Document Released: 11/02/2005 Document Revised:  07/05/2013 Document Reviewed: 04/05/2013 [facility] Patient Information 2015 [place], [place]. This information is not intended to replace advice given to you by your health care provider. Make sure you discuss any questions you have with your health care provider.

## 2014-05-14 ENCOUNTER — Ambulatory Visit: Payer: Medicare Other | Admitting: Family Medicine

## 2014-05-17 DIAGNOSIS — Z6828 Body mass index (BMI) 28.0-28.9, adult: Secondary | ICD-10-CM | POA: Diagnosis not present

## 2014-05-17 DIAGNOSIS — M5137 Other intervertebral disc degeneration, lumbosacral region: Secondary | ICD-10-CM | POA: Diagnosis not present

## 2014-05-25 ENCOUNTER — Other Ambulatory Visit: Payer: Self-pay | Admitting: Neurosurgery

## 2014-05-25 DIAGNOSIS — M5136 Other intervertebral disc degeneration, lumbar region: Secondary | ICD-10-CM

## 2014-05-25 DIAGNOSIS — M542 Cervicalgia: Secondary | ICD-10-CM

## 2014-05-30 ENCOUNTER — Ambulatory Visit
Admission: RE | Admit: 2014-05-30 | Discharge: 2014-05-30 | Disposition: A | Discharge status: Home / Self Care | Payer: Medicare Other | Source: Ambulatory Visit | Attending: Neurosurgery | Admitting: Neurosurgery

## 2014-05-30 VITALS — BP 88/51 | HR 52

## 2014-05-30 DIAGNOSIS — M542 Cervicalgia: Secondary | ICD-10-CM

## 2014-05-30 DIAGNOSIS — M545 Low back pain, unspecified: Secondary | ICD-10-CM | POA: Diagnosis not present

## 2014-05-30 DIAGNOSIS — M48061 Spinal stenosis, lumbar region without neurogenic claudication: Secondary | ICD-10-CM | POA: Diagnosis not present

## 2014-05-30 DIAGNOSIS — M546 Pain in thoracic spine: Secondary | ICD-10-CM | POA: Diagnosis not present

## 2014-05-30 DIAGNOSIS — M519 Unspecified thoracic, thoracolumbar and lumbosacral intervertebral disc disorder: Secondary | ICD-10-CM | POA: Diagnosis not present

## 2014-05-30 DIAGNOSIS — M47812 Spondylosis without myelopathy or radiculopathy, cervical region: Secondary | ICD-10-CM | POA: Diagnosis not present

## 2014-05-30 DIAGNOSIS — M47817 Spondylosis without myelopathy or radiculopathy, lumbosacral region: Secondary | ICD-10-CM | POA: Diagnosis not present

## 2014-05-30 DIAGNOSIS — M5136 Other intervertebral disc degeneration, lumbar region: Secondary | ICD-10-CM

## 2014-05-30 MED ORDER — OXYCODONE-ACETAMINOPHEN 5-325 MG PO TABS
2.0000 | ORAL_TABLET | Freq: Once | ORAL | Status: AC
Start: 2014-05-30 — End: 2014-05-30
  Administered 2014-05-30: 2 via ORAL

## 2014-05-30 MED ORDER — ONDANSETRON HCL 8 MG PO TABS
8.0000 mg | ORAL_TABLET | Freq: Once | ORAL | Status: AC
  Administered 2014-05-30: 8 mg via ORAL

## 2014-05-30 MED ORDER — DIAZEPAM 5 MG PO TABS
5.0000 mg | ORAL_TABLET | Freq: Once | ORAL | Status: AC
  Administered 2014-05-30: 5 mg via ORAL

## 2014-05-30 MED ORDER — IOHEXOL 300 MG/ML  SOLN
10.0000 mL | Freq: Once | INTRAMUSCULAR | Status: AC | PRN
  Administered 2014-05-30: 10 mL via INTRATHECAL

## 2014-05-30 NOTE — Discharge Instructions (Signed)
Myelogram Discharge Instructions  1. Go home and rest quietly for the next 24 hours.  It is important to lie flat for the next 24 hours.  Get up only to go to the restroom.  You may lie in the bed or on a couch on your back, your stomach, your left side or your right side.  You may have one pillow under your head.  You may have pillows between your knees while you are on your side or under your knees while you are on your back.  2. DO NOT drive today.  Recline the seat as far back as it will go, while still wearing your seat belt, on the way home.  3. You may get up to go to the bathroom as needed.  You may sit up for 10 minutes to eat.  You may resume your normal diet and medications unless otherwise indicated.  Drink plenty of extra fluids today and tomorrow.  4. The incidence of a spinal headache with nausea and/or vomiting is about 5% (one in 20 patients).  If you develop a headache, lie flat and drink plenty of fluids until the headache goes away.  Caffeinated beverages may be helpful.  If you develop severe nausea and vomiting or a headache that does not go away with flat bed rest, call [phone].  5. You may resume normal activities after your 24 hours of bed rest is over; however, do not exert yourself strongly or do any heavy lifting tomorrow.  6. Call your physician for a follow-up appointment.   You may resume Imitrex on Thursday, May 31, 2014 after 9:30am.

## 2014-06-04 DIAGNOSIS — Z6828 Body mass index (BMI) 28.0-28.9, adult: Secondary | ICD-10-CM | POA: Diagnosis not present

## 2014-06-04 DIAGNOSIS — M5412 Radiculopathy, cervical region: Secondary | ICD-10-CM | POA: Diagnosis not present

## 2014-06-06 DIAGNOSIS — H524 Presbyopia: Secondary | ICD-10-CM | POA: Diagnosis not present

## 2014-06-06 DIAGNOSIS — H4011X0 Primary open-angle glaucoma, stage unspecified: Secondary | ICD-10-CM | POA: Diagnosis not present

## 2014-06-06 DIAGNOSIS — H2510 Age-related nuclear cataract, unspecified eye: Secondary | ICD-10-CM | POA: Diagnosis not present

## 2014-06-09 ENCOUNTER — Other Ambulatory Visit: Payer: Self-pay | Admitting: Family Medicine

## 2014-07-05 DIAGNOSIS — M545 Low back pain, unspecified: Secondary | ICD-10-CM | POA: Diagnosis not present

## 2014-07-05 DIAGNOSIS — M5137 Other intervertebral disc degeneration, lumbosacral region: Secondary | ICD-10-CM | POA: Diagnosis not present

## 2014-07-24 DIAGNOSIS — E119 Type 2 diabetes mellitus without complications: Secondary | ICD-10-CM | POA: Diagnosis not present

## 2014-07-24 DIAGNOSIS — H43819 Vitreous degeneration, unspecified eye: Secondary | ICD-10-CM | POA: Diagnosis not present

## 2014-07-24 DIAGNOSIS — H2510 Age-related nuclear cataract, unspecified eye: Secondary | ICD-10-CM | POA: Diagnosis not present

## 2014-07-24 DIAGNOSIS — H4011X0 Primary open-angle glaucoma, stage unspecified: Secondary | ICD-10-CM | POA: Diagnosis not present

## 2014-07-24 LAB — HM DIABETES EYE EXAM

## 2014-08-06 ENCOUNTER — Encounter: Payer: Self-pay | Admitting: Family Medicine

## 2014-08-06 ENCOUNTER — Ambulatory Visit (INDEPENDENT_AMBULATORY_CARE_PROVIDER_SITE_OTHER): Payer: Medicare Other | Admitting: Family Medicine

## 2014-08-06 VITALS — BP 115/62 | HR 62 | Temp 97.7°F | Resp 14 | Ht 72.0 in | Wt 205.4 lb

## 2014-08-06 DIAGNOSIS — I10 Essential (primary) hypertension: Secondary | ICD-10-CM

## 2014-08-06 DIAGNOSIS — E119 Type 2 diabetes mellitus without complications: Secondary | ICD-10-CM | POA: Diagnosis not present

## 2014-08-06 DIAGNOSIS — E785 Hyperlipidemia, unspecified: Secondary | ICD-10-CM | POA: Diagnosis not present

## 2014-08-06 DIAGNOSIS — N200 Calculus of kidney: Secondary | ICD-10-CM | POA: Diagnosis not present

## 2014-08-06 LAB — LIPID PANEL
Cholesterol: 128 mg/dL (ref 0–200)
HDL: 39 mg/dL — ABNORMAL LOW (ref >39)
LDL Cholesterol: 65 mg/dL (ref 0–99)
Total CHOL/HDL Ratio: 3.3 Ratio
Triglycerides: 119 mg/dL (ref <150)
VLDL: 24 mg/dL (ref 0–40)

## 2014-08-06 LAB — COMPLETE METABOLIC PANEL WITH GFR
ALT: 23 U/L (ref 0–53)
AST: 19 U/L (ref 0–37)
Albumin: 4.5 g/dL (ref 3.5–5.2)
Alkaline Phosphatase: 60 U/L (ref 39–117)
BUN: 15 mg/dL (ref 6–23)
CO2: 29 mEq/L (ref 19–32)
Calcium: 10.5 mg/dL (ref 8.4–10.5)
Chloride: 103 mEq/L (ref 96–112)
Creat: 1.01 mg/dL (ref 0.50–1.35)
GFR, Est African American: 88 mL/min
GFR, Est Non African American: 76 mL/min
Glucose, Bld: 102 mg/dL — ABNORMAL HIGH (ref 70–99)
Potassium: 4.1 mEq/L (ref 3.5–5.3)
Sodium: 140 mEq/L (ref 135–145)
Total Bilirubin: 0.7 mg/dL (ref 0.2–1.2)
Total Protein: 6.4 g/dL (ref 6.0–8.3)

## 2014-08-06 LAB — URIC ACID: Uric Acid, Serum: 5.2 mg/dL (ref 4.0–7.8)

## 2014-08-06 LAB — POCT GLYCOSYLATED HEMOGLOBIN (HGB A1C): Hemoglobin A1C: 5.8

## 2014-08-06 NOTE — Progress Notes (Signed)
Patient ID: [name], male   DOB: April 25, 1946, 68 y.o.   MRN: [id number]  _0 @  Patient ID: [name] MRN: [id number], DOB: September 30, 1946, 68 y.o. Date of Encounter: 08/06/2014, 8:16 AM  Primary Physician: Robyn Haber, MD  This chart was scribed for Robyn Haber, MD by Rayfield Citizen, medical scribe. This patient was seen in room 27 and the patient's care was started at 8:18 AM.   Chief Complaint:   HPI: 68 y.o. year old male with history below presents with   During a previous visit, he was instructed to stop a medication (chlorithalidone?) for fear of his kidney stones. Patient states, however, that he did not feel well off of this medication and notes that his blood pressure crept up to a level he was uncomfortable with (over 130), so he is now taking half a pill at a time, rather than his prior dose.   He denies any new pain or paresthesia in his feet (he notes the chronic paresthesia around his big toes). He does report mild peeling and scaling but this does not concern him at this time.   He reports chronic back pain; he had an injection on August 24th 2015 (Dr. Maryjean Ka) that relieved his pain for approximately 3 days. He states his pain is worsened sitting in chairs without lumbar support. He also notes difficulty with standing and ambulation.   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   Past Medical History  Diagnosis Date   Complication of anesthesia    Coronary artery disease    Hypertension    Diabetes mellitus without complication    Chronic kidney disease     Hx of stones   Headache(784.0)     migraines   Cancer     skin ca   Arthritis      Home Meds: Prior to Admission medications   Medication Sig Start Date End Date Taking? Authorizing Provider  amLODipine (NORVASC) 10 MG tablet Take 1 tablet (10 mg total) by mouth daily. 03/29/14   Robyn Haber, MD  aspirin 81 MG tablet Take 81 mg by mouth daily.    Historical Provider, MD  atorvastatin  (LIPITOR) 80 MG tablet TAKE 1 TABLET DAILY    Robyn Haber, MD  brimonidine (ALPHAGAN) 0.15 % ophthalmic solution 1 drop 3 (three) times daily.    Historical Provider, MD  Canagliflozin (INVOKANA) 100 MG TABS Take 1 tablet (100 mg total) by mouth daily. 02/08/14   Robyn Haber, MD  Cholecalciferol (VITAMIN D-3 PO) Take by mouth.    Historical Provider, MD  clotrimazole-betamethasone (LOTRISONE) cream Apply 1 application topically 2 (two) times daily. 10/31/13   Robyn Haber, MD  Fingerstix Lancets MISC To check blood sugar once daily dx code 250.01 12/27/12   Rise Mu, PA-C  fish oil-omega-3 fatty acids 1000 MG capsule Take 2 g by mouth daily.    Historical Provider, MD  gemfibrozil (LOPID) 600 MG tablet TAKE 1 TABLET DAILY    Robyn Haber, MD  glucose blood test strip Use as instructed 11/29/13   Robyn Haber, MD  glucose monitoring kit (FREESTYLE) monitoring kit 1 each by Does not apply route as needed for other. Needs Accucheck Avia meter to check glucose once daily dx 250.01 12/27/12   Areta Haber Dunn, PA-C  latanoprost (XALATAN) 0.005 % ophthalmic solution 1 drop at bedtime.    Historical Provider, MD  meloxicam (MOBIC) 7.5 MG tablet Take 7.5 mg by mouth as needed.     Historical Provider, MD  metFORMIN (GLUCOPHAGE-XR) 500 MG 24 hr tablet bid 11/29/13   Robyn Haber, MD  Multiple Vitamin (MULTIVITAMIN) tablet Take 1 tablet by mouth daily.    Historical Provider, MD  OVER THE COUNTER MEDICATION Take by mouth 2 (two) times daily. Occutabs    Historical Provider, MD  OVER THE COUNTER MEDICATION OTC Lipo-Flavonoid Plus taking 3 times a day for tinnitus    Historical Provider, MD  PRESCRIPTION MEDICATION Place into the nose 3 (three) times daily. Lidocane nasal spray    Historical Provider, MD  propranolol ER (INDERAL LA) 60 MG 24 hr capsule Take 1 capsule (60 mg total) by mouth 2 (two) times daily. 12/10/13   Robyn Haber, MD  SUMAtriptan (IMITREX) 50 MG tablet Take 1 tablet (50 mg  total) by mouth every 2 (two) hours as needed for migraine. 09/01/12 02/01/14  Robyn Haber, MD    Allergies:  Allergies  Allergen Reactions   Amaryl Other (See Comments)    Gives pt migraines   Skelaxin [Metaxalone] Rash    Cherry red rash with sloughing off of skin at left upper chest/shoulder   Metformin And Related Diarrhea   Levofloxacin Nausea Only   Lisinopril Other (See Comments)    cough    History   Social History   Marital Status: Married    Spouse Name: N/A    Number of Children: N/A   Years of Education: N/A   Occupational History   Not on file.   Social History Main Topics   Smoking status: Never Smoker    Smokeless tobacco: Never Used   Alcohol Use: Yes     Comment: ocas   Drug Use: No   Sexual Activity: Not on file   Other Topics Concern   Not on file   Social History Narrative   No narrative on file     Review of Systems: Constitutional: negative for chills, fever, night sweats, weight changes, or fatigue  HEENT: negative for vision changes, hearing loss, congestion, rhinorrhea, ST, epistaxis, or sinus pressure Cardiovascular: negative for chest pain or palpitations Respiratory: negative for hemoptysis, wheezing, shortness of breath, or cough Abdominal: negative for abdominal pain, nausea, vomiting, diarrhea, or constipation Dermatological: negative for rash Neurologic: negative for headache, dizziness, or syncope All other systems reviewed and are otherwise negative with the exception to those above and in the HPI.   Physical Exam: There were no vitals taken for this visit., There is no weight on file to calculate BMI. General: Well developed, well nourished, in no acute distress. Head: Normocephalic, atraumatic, eyes without discharge, sclera non-icteric, nares are without discharge. Bilateral auditory canals clear, TM's are without perforation, pearly grey and translucent with reflective cone of light bilaterally. Oral  cavity moist, posterior pharynx without exudate, erythema, peritonsillar abscess, or post nasal drip.  Neck: Supple. No thyromegaly. Full ROM. No lymphadenopathy. Lungs: Clear bilaterally to auscultation without wheezes, rales, or rhonchi. Breathing is unlabored. Heart: RRR with S1 S2. No murmurs, rubs, or gallops appreciated. Abdomen: Soft, non-tender, non-distended with normoactive bowel sounds. No hepatomegaly. No rebound/guarding. No obvious abdominal masses. Msk:  Strength and tone normal for age. Extremities/Skin: Warm and dry. No clubbing or cyanosis. No edema. No rashes or suspicious lesions. Neuro: Alert and oriented X 3. Moves all extremities spontaneously. Gait is normal. CNII-XII grossly in tact. Psych:  Responds to questions appropriately with a normal affect.   Labs: Results for orders placed in visit on 05/07/14  CBC WITH DIFFERENTIAL      Result Value Ref  Range   WBC 5.3  4.0 - 10.5 K/uL   RBC 4.72  4.22 - 5.81 MIL/uL   Hemoglobin 15.0  13.0 - 17.0 g/dL   HCT 42.4  39.0 - 52.0 %   MCV 89.8  78.0 - 100.0 fL   MCH 31.8  26.0 - 34.0 pg   MCHC 35.4  30.0 - 36.0 g/dL   RDW 13.8  11.5 - 15.5 %   Platelets 224  150 - 400 K/uL   Neutrophils Relative % 57  43 - 77 %   Neutro Abs 3.0  1.7 - 7.7 K/uL   Lymphocytes Relative 30  12 - 46 %   Lymphs Abs 1.6  0.7 - 4.0 K/uL   Monocytes Relative 10  3 - 12 %   Monocytes Absolute 0.5  0.1 - 1.0 K/uL   Eosinophils Relative 2  0 - 5 %   Eosinophils Absolute 0.1  0.0 - 0.7 K/uL   Basophils Relative 1  0 - 1 %   Basophils Absolute 0.1  0.0 - 0.1 K/uL   Smear Review Criteria for review not met    COMPLETE METABOLIC PANEL WITH GFR      Result Value Ref Range   Sodium 141  135 - 145 mEq/L   Potassium 3.6  3.5 - 5.3 mEq/L   Chloride 105  96 - 112 mEq/L   CO2 26  19 - 32 mEq/L   Glucose, Bld 118 (*) 70 - 99 mg/dL   BUN 21  6 - 23 mg/dL   Creat 0.98  0.50 - 1.35 mg/dL   Total Bilirubin 0.6  0.2 - 1.2 mg/dL   Alkaline Phosphatase 50   39 - 117 U/L   AST 23  0 - 37 U/L   ALT 27  0 - 53 U/L   Total Protein 6.8  6.0 - 8.3 g/dL   Albumin 4.7  3.5 - 5.2 g/dL   Calcium 10.7 (*) 8.4 - 10.5 mg/dL   GFR, Est African American >89     GFR, Est Non African American 79    URIC ACID      Result Value Ref Range   Uric Acid, Serum 5.2  4.0 - 7.8 mg/dL  GLUCOSE, POCT (MANUAL RESULT ENTRY)      Result Value Ref Range   POC Glucose 109 (*) 70 - 99 mg/dl  POCT GLYCOSYLATED HEMOGLOBIN (HGB A1C)      Result Value Ref Range   Hemoglobin A1C 5.8    POCT URINALYSIS DIPSTICK      Result Value Ref Range   Color, UA yellow     Clarity, UA clear     Glucose, UA 1000     Bilirubin, UA neg     Ketones, UA neg     Spec Grav, UA 1.015     Blood, UA neg     pH, UA 7.0     Protein, UA neg     Urobilinogen, UA 0.2     Nitrite, UA neg     Leukocytes, UA Negative     Results for orders placed in visit on 08/06/14  POCT GLYCOSYLATED HEMOGLOBIN (HGB A1C)      Result Value Ref Range   Hemoglobin A1C 5.8       ASSESSMENT AND PLAN:  68 y.o. year old male with HYPERLIPIDEMIA - Plan: Lipid panel  Essential hypertension - Plan: COMPLETE METABOLIC PANEL WITH GFR  DM - Plan: POCT glycosylated hemoglobin (Hb A1C)  Kidney stones -  Plan: Uric acid   Signed, Robyn Haber, MD 08/06/2014 8:16 AM

## 2014-08-07 DIAGNOSIS — E119 Type 2 diabetes mellitus without complications: Secondary | ICD-10-CM

## 2014-08-09 DIAGNOSIS — Q7649 Other congenital malformations of spine, not associated with scoliosis: Secondary | ICD-10-CM | POA: Diagnosis not present

## 2014-08-09 DIAGNOSIS — M5412 Radiculopathy, cervical region: Secondary | ICD-10-CM | POA: Diagnosis not present

## 2014-08-16 ENCOUNTER — Encounter: Payer: Self-pay | Admitting: Family Medicine

## 2014-08-18 ENCOUNTER — Other Ambulatory Visit: Payer: Self-pay | Admitting: Family Medicine

## 2014-08-24 DIAGNOSIS — H4011X3 Primary open-angle glaucoma, severe stage: Secondary | ICD-10-CM | POA: Diagnosis not present

## 2014-08-24 DIAGNOSIS — H4011X2 Primary open-angle glaucoma, moderate stage: Secondary | ICD-10-CM | POA: Diagnosis not present

## 2014-08-24 DIAGNOSIS — H2513 Age-related nuclear cataract, bilateral: Secondary | ICD-10-CM | POA: Diagnosis not present

## 2014-08-30 DIAGNOSIS — M5136 Other intervertebral disc degeneration, lumbar region: Secondary | ICD-10-CM | POA: Diagnosis not present

## 2014-08-30 DIAGNOSIS — M5412 Radiculopathy, cervical region: Secondary | ICD-10-CM | POA: Diagnosis not present

## 2014-08-30 DIAGNOSIS — M47812 Spondylosis without myelopathy or radiculopathy, cervical region: Secondary | ICD-10-CM | POA: Diagnosis not present

## 2014-08-30 DIAGNOSIS — Z6828 Body mass index (BMI) 28.0-28.9, adult: Secondary | ICD-10-CM | POA: Diagnosis not present

## 2014-09-05 ENCOUNTER — Ambulatory Visit (INDEPENDENT_AMBULATORY_CARE_PROVIDER_SITE_OTHER): Payer: Medicare Other | Admitting: Emergency Medicine

## 2014-09-05 VITALS — BP 128/76 | HR 68 | Temp 98.4°F | Resp 16 | Ht 72.25 in | Wt 207.0 lb

## 2014-09-05 DIAGNOSIS — N492 Inflammatory disorders of scrotum: Secondary | ICD-10-CM | POA: Diagnosis not present

## 2014-09-05 MED ORDER — DOXYCYCLINE HYCLATE 100 MG PO TABS: 100.0000 mg | ORAL_TABLET | Freq: Two times a day (BID) | ORAL | Status: DC

## 2014-09-05 NOTE — Patient Instructions (Signed)

## 2014-09-05 NOTE — Progress Notes (Signed)
° °  Subjective:    Patient ID: [name], male    DOB: [birth year], 68 y.o.   MRN: [id number]  This chart was scribed for Arlyss Queen, MD by Edison Simon, ED Scribe. This patient was seen in room 10 and the patient's care was started at 12:09 PM.   HPI  HPI Comments: [name] is a 68 y.o. male who presents to the [facility] complaining of a subcutaneous sebaceous cyst to his right scrotum that has been present for some time but became swollen and painful 2 days ago. He states it is the size of a garden pea. He states it has never flared up before. He reports history of staph infections to his legs; he states he has never cultured MRSA before. He also reports chronic back pain. He states he has high blood sugar and is pre-diabetic. He states he checks his sugars in the mornings and was 155 this morning, he states it is usually 125-130. He states he has used doxycycline before without problems.  Review of Systems  Genitourinary:       Sebaceous cyst to scrotum  Musculoskeletal: Positive for back pain.       Objective:   Physical Exam CONSTITUTIONAL: Well developed/well nourished HEAD: Normocephalic/atraumatic EYES: EOMI/PERRL ENMT: Mucous membranes moist NECK: supple no meningeal signs SPINE:entire spine nontender CV: S1/S2 noted, no murmurs/rubs/gallops noted LUNGS: Lungs are clear to auscultation bilaterally, no apparent distress ABDOMEN: soft, nontender, no rebound or guarding GU: No cva tenderness, 1x1.5cm erythematous tender area to the right lower portion of scrotum, no fluctuance, no drainage NEURO: Pt is awake/alert, moves all extremitiesx4 EXTREMITIES: pulses normal, full ROM SKIN: warm, color normal PSYCH: no abnormalities of mood noted     Assessment & Plan:  Patient appears to have an infected sebaceous cyst on the lower portion of his scrotum. Will treat with doxycycline, soap and water cleaning, and warm compresses.  I personally  performed the services described in this documentation, which was scribed in my presence. The recorded information has been reviewed and is accurate.

## 2014-09-25 ENCOUNTER — Encounter: Payer: Self-pay | Admitting: Family Medicine

## 2014-09-27 DIAGNOSIS — M47812 Spondylosis without myelopathy or radiculopathy, cervical region: Secondary | ICD-10-CM | POA: Diagnosis not present

## 2014-09-27 DIAGNOSIS — M5412 Radiculopathy, cervical region: Secondary | ICD-10-CM | POA: Diagnosis not present

## 2014-09-27 DIAGNOSIS — M4806 Spinal stenosis, lumbar region: Secondary | ICD-10-CM | POA: Diagnosis not present

## 2014-10-25 DIAGNOSIS — M5137 Other intervertebral disc degeneration, lumbosacral region: Secondary | ICD-10-CM | POA: Diagnosis not present

## 2014-10-25 DIAGNOSIS — M545 Low back pain: Secondary | ICD-10-CM | POA: Diagnosis not present

## 2014-10-25 DIAGNOSIS — M4806 Spinal stenosis, lumbar region: Secondary | ICD-10-CM | POA: Diagnosis not present

## 2014-10-31 ENCOUNTER — Telehealth: Payer: Self-pay | Admitting: Family Medicine

## 2014-10-31 NOTE — Telephone Encounter (Signed)
Patient will get the flu shot on jan 7when he comes in for his appointment with Dr. Joseph Art.

## 2014-11-01 ENCOUNTER — Ambulatory Visit: Payer: Medicare Other | Admitting: Family Medicine

## 2014-11-19 ENCOUNTER — Other Ambulatory Visit: Payer: Self-pay | Admitting: Family Medicine

## 2014-11-22 ENCOUNTER — Ambulatory Visit (INDEPENDENT_AMBULATORY_CARE_PROVIDER_SITE_OTHER): Payer: Medicare Other | Admitting: Family Medicine

## 2014-11-22 ENCOUNTER — Encounter: Payer: Self-pay | Admitting: Family Medicine

## 2014-11-22 DIAGNOSIS — B958 Unspecified staphylococcus as the cause of diseases classified elsewhere: Secondary | ICD-10-CM | POA: Diagnosis not present

## 2014-11-22 DIAGNOSIS — M5137 Other intervertebral disc degeneration, lumbosacral region: Secondary | ICD-10-CM | POA: Diagnosis not present

## 2014-11-22 DIAGNOSIS — Z23 Encounter for immunization: Secondary | ICD-10-CM

## 2014-11-22 DIAGNOSIS — M47812 Spondylosis without myelopathy or radiculopathy, cervical region: Secondary | ICD-10-CM | POA: Diagnosis not present

## 2014-11-22 DIAGNOSIS — M5136 Other intervertebral disc degeneration, lumbar region: Secondary | ICD-10-CM | POA: Diagnosis not present

## 2014-11-22 DIAGNOSIS — M546 Pain in thoracic spine: Secondary | ICD-10-CM | POA: Diagnosis not present

## 2014-11-22 DIAGNOSIS — M4806 Spinal stenosis, lumbar region: Secondary | ICD-10-CM | POA: Diagnosis not present

## 2014-11-22 DIAGNOSIS — M545 Low back pain: Secondary | ICD-10-CM | POA: Diagnosis not present

## 2014-11-22 DIAGNOSIS — E119 Type 2 diabetes mellitus without complications: Secondary | ICD-10-CM | POA: Diagnosis not present

## 2014-11-22 DIAGNOSIS — Z6828 Body mass index (BMI) 28.0-28.9, adult: Secondary | ICD-10-CM | POA: Diagnosis not present

## 2014-11-22 DIAGNOSIS — M5412 Radiculopathy, cervical region: Secondary | ICD-10-CM | POA: Diagnosis not present

## 2014-11-22 LAB — GLUCOSE, POCT (MANUAL RESULT ENTRY): POC Glucose: 123 mg/dl — AB (ref 70–99)

## 2014-11-22 LAB — POCT GLYCOSYLATED HEMOGLOBIN (HGB A1C): Hemoglobin A1C: 6.1

## 2014-11-22 MED ORDER — ZOSTER VACCINE LIVE 19400 UNT/0.65ML ~~LOC~~ SOLR: 0.6500 mL | Freq: Once | SUBCUTANEOUS | Status: DC

## 2014-11-22 MED ORDER — SITAGLIPTIN PHOSPHATE 100 MG PO TABS: 100.0000 mg | ORAL_TABLET | Freq: Every day | ORAL | Status: DC

## 2014-11-22 MED ORDER — PNEUMOCOCCAL 13-VAL CONJ VACC IM SUSP: 0.5000 mL | INTRAMUSCULAR | Status: DC

## 2014-11-22 MED ORDER — MUPIROCIN 2 % EX OINT: 1.0000 "application " | TOPICAL_OINTMENT | Freq: Two times a day (BID) | CUTANEOUS | Status: DC

## 2014-11-22 NOTE — Progress Notes (Addendum)
   Subjective:    Patient ID: [name], male    DOB: Sep 02, 1946, 69 y.o.   MRN: [id number] This chart was scribed for Robyn Haber, MD by Zola Button, Medical Scribe. This patient was seen in Room 25 and the patient's care was started at 1:26 PM.   HPI HPI Comments: [name] is a 69 y.o. male with a hx of DM who presents to the [facility] for a follow-up. Patient is currently on Invokana and would like to be switched back to Januvia because his blood sugars seemed to be lower on Januvia. He has been using diabetic socks. Patient gets his eyes checked about once a year.  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  Review of Systems No new gi or gu issues    Objective:   Physical Exam CONSTITUTIONAL: Well developed/well nourished HEAD: Normocephalic/atraumatic EYES: EOM/PERRL ENMT: Mucous membranes moist NECK: supple no meningeal signs SPINE: entire spine nontender CV: S1/S2 noted, no murmurs/rubs/gallops noted LUNGS: Lungs are clear to auscultation bilaterally, no apparent distress ABDOMEN: soft, nontender, no rebound or guarding GU: no cva tenderness NEURO: Pt is awake/alert, moves all extremitiesx4 EXTREMITIES: pulses normal, full ROM SKIN: warm, color normal PSYCH: no abnormalities of mood noted  Results for orders placed or performed in visit on 11/22/14  POCT glycosylated hemoglobin (Hb A1C)  Result Value Ref Range   Hemoglobin A1C 6.1   POCT glucose (manual entry)  Result Value Ref Range   POC Glucose 123 (A) 70 - 99 mg/dl       Assessment & Plan:   This chart was scribed in my presence and reviewed by me personally.    ICD-9-CM ICD-10-CM   1. Type 2 diabetes mellitus, controlled 250.00 E11.9 sitaGLIPtin (JANUVIA) 100 MG tablet     POCT glycosylated hemoglobin (Hb A1C)     POCT glucose (manual entry)     DISCONTINUED: sitaGLIPtin (JANUVIA) 100 MG tablet  2. Need for prophylactic vaccination and inoculation against influenza V04.81 Z23 Flu  Vaccine QUAD 36+ mos IM  3. Staph infection 041.10 B95.8 mupirocin ointment (BACTROBAN) 2 %     Signed, Robyn Haber, MD

## 2014-12-05 ENCOUNTER — Emergency Department (HOSPITAL_COMMUNITY)
Admission: EM | Admit: 2014-12-05 | Discharge: 2014-12-05 | Disposition: A | Discharge status: Home / Self Care | Payer: Medicare Other | Attending: Emergency Medicine | Admitting: Emergency Medicine

## 2014-12-05 ENCOUNTER — Encounter (HOSPITAL_COMMUNITY): Payer: Self-pay | Admitting: Emergency Medicine

## 2014-12-05 DIAGNOSIS — Z7982 Long term (current) use of aspirin: Secondary | ICD-10-CM | POA: Diagnosis not present

## 2014-12-05 DIAGNOSIS — M1990 Unspecified osteoarthritis, unspecified site: Secondary | ICD-10-CM | POA: Diagnosis not present

## 2014-12-05 DIAGNOSIS — N189 Chronic kidney disease, unspecified: Secondary | ICD-10-CM | POA: Insufficient documentation

## 2014-12-05 DIAGNOSIS — R040 Epistaxis: Secondary | ICD-10-CM | POA: Insufficient documentation

## 2014-12-05 DIAGNOSIS — R0902 Hypoxemia: Secondary | ICD-10-CM | POA: Diagnosis not present

## 2014-12-05 DIAGNOSIS — I2510 Atherosclerotic heart disease of native coronary artery without angina pectoris: Secondary | ICD-10-CM | POA: Insufficient documentation

## 2014-12-05 DIAGNOSIS — Z79899 Other long term (current) drug therapy: Secondary | ICD-10-CM | POA: Insufficient documentation

## 2014-12-05 DIAGNOSIS — I129 Hypertensive chronic kidney disease with stage 1 through stage 4 chronic kidney disease, or unspecified chronic kidney disease: Secondary | ICD-10-CM | POA: Diagnosis not present

## 2014-12-05 DIAGNOSIS — Z85828 Personal history of other malignant neoplasm of skin: Secondary | ICD-10-CM | POA: Insufficient documentation

## 2014-12-05 DIAGNOSIS — E119 Type 2 diabetes mellitus without complications: Secondary | ICD-10-CM | POA: Insufficient documentation

## 2014-12-05 LAB — I-STAT CHEM 8, ED
BUN: 21 mg/dL (ref 6–23)
CREATININE: 0.9 mg/dL (ref 0.50–1.35)
Calcium, Ion: 1.25 mmol/L (ref 1.13–1.30)
Chloride: 101 mEq/L (ref 96–112)
GLUCOSE: 132 mg/dL — AB (ref 70–99)
HEMATOCRIT: 47 % (ref 39.0–52.0)
Hemoglobin: 16 g/dL (ref 13.0–17.0)
Potassium: 3.9 mmol/L (ref 3.5–5.1)
Sodium: 139 mmol/L (ref 135–145)
TCO2: 26 mmol/L (ref 0–100)

## 2014-12-05 LAB — CBC WITH DIFFERENTIAL/PLATELET
Basophils Absolute: 0 10*3/uL (ref 0.0–0.1)
Basophils Relative: 0 % (ref 0–1)
Eosinophils Absolute: 0.1 10*3/uL (ref 0.0–0.7)
Eosinophils Relative: 1 % (ref 0–5)
HCT: 45.3 % (ref 39.0–52.0)
Hemoglobin: 15.7 g/dL (ref 13.0–17.0)
LYMPHS ABS: 1.2 10*3/uL (ref 0.7–4.0)
Lymphocytes Relative: 19 % (ref 12–46)
MCH: 32.4 pg (ref 26.0–34.0)
MCHC: 34.7 g/dL (ref 30.0–36.0)
MCV: 93.4 fL (ref 78.0–100.0)
Monocytes Absolute: 0.6 10*3/uL (ref 0.1–1.0)
Monocytes Relative: 10 % (ref 3–12)
Neutro Abs: 4.4 10*3/uL (ref 1.7–7.7)
Neutrophils Relative %: 70 % (ref 43–77)
Platelets: 191 10*3/uL (ref 150–400)
RBC: 4.85 MIL/uL (ref 4.22–5.81)
RDW: 13 % (ref 11.5–15.5)
WBC: 6.3 10*3/uL (ref 4.0–10.5)

## 2014-12-05 LAB — PROTIME-INR
INR: 1 (ref 0.00–1.49)
PROTHROMBIN TIME: 13.3 s (ref 11.6–15.2)

## 2014-12-05 MED ORDER — OXYMETAZOLINE HCL 0.05 % NA SOLN
1.0000 | Freq: Once | NASAL | Status: AC
  Administered 2014-12-05: 1 via NASAL
  Filled 2014-12-05: qty 15

## 2014-12-05 NOTE — Discharge Instructions (Signed)
Afrin two sprays in each nostrils if bleeding starts again. Pressure for 10 min. If worsening return to ER.    Nosebleed Nosebleeds can be caused by many conditions, including trauma, infections, polyps, foreign bodies, dry mucous membranes or climate, medicines, and air conditioning. Most nosebleeds occur in the front of the nose. Because of this location, most nosebleeds can be controlled by pinching the nostrils gently and continuously for at least 10 to 20 minutes. The long, continuous pressure allows enough time for the blood to clot. If pressure is released during that 10 to 20 minute time period, the process may have to be started again. The nosebleed may stop by itself or quit with pressure, or it may need concentrated heating (cautery) or pressure from packing. HOME CARE INSTRUCTIONS   If your nose was packed, try to maintain the pack inside until your health care provider removes it. If a gauze pack was used and it starts to fall out, gently replace it or cut the end off. Do not cut if a balloon catheter was used to pack the nose. Otherwise, do not remove unless instructed.  Avoid blowing your nose for 12 hours after treatment. This could dislodge the pack or clot and start the bleeding again.  If the bleeding starts again, sit up and bend forward, gently pinching the front half of your nose continuously for 20 minutes.  If bleeding was caused by dry mucous membranes, use over-the-counter saline nasal spray or gel. This will keep the mucous membranes moist and allow them to heal. If you must use a lubricant, choose the water-soluble variety. Use it only sparingly and not within several hours of lying down.  Do not use petroleum jelly or mineral oil, as these may drip into the lungs and cause serious problems.  Maintain humidity in your home by using less air conditioning or by using a humidifier.  Do not use aspirin or medicines which make bleeding more likely. Your health care provider  can give you recommendations on this.  Resume normal activities as you are able, but try to avoid straining, lifting, or bending at the waist for several days.  If the nosebleeds become recurrent and the cause is unknown, your health care provider may suggest laboratory tests. SEEK MEDICAL CARE IF: You have a fever. SEEK IMMEDIATE MEDICAL CARE IF:   Bleeding recurs and cannot be controlled.  There is unusual bleeding from or bruising on other parts of the body.  Nosebleeds continue.  There is any worsening of the condition which originally brought you in.  You become light-headed, feel faint, become sweaty, or vomit blood. MAKE SURE YOU:   Understand these instructions.  Will watch your condition.  Will get help right away if you are not doing well or get worse. Document Released: 08/12/2005 Document Revised: 03/19/2014 Document Reviewed: 10/03/2009 [facility] Patient Information 2015 [place], [place]. This information is not intended to replace advice given to you by your health care provider. Make sure you discuss any questions you have with your health care provider.

## 2014-12-05 NOTE — ED Provider Notes (Signed)
CSN: [id number]     Arrival date & time 12/05/14  5400 History   First MD Initiated Contact with Patient 12/05/14 1012     Chief Complaint  Patient presents with  . Epistaxis     (Consider location/radiation/quality/duration/timing/severity/associated sxs/prior Treatment) HPI [name] is a 69 y.o. male with history of CAD, htn, DM, presents to ED with complaint of a nose bleed. Pt states nose started bleeding shortly after he took a shower and was getting dressed. States started from one side but then began bleeding from both. He tried applying pressure with no relief. Pt denies prior nose bleeds. Denies trauma. Pt is taking aspirin, no other blood thinners. Pt's nose actively bleeding in triage. He denies ha, no weakness or dizziness. No pain.   Past Medical History  Diagnosis Date  . Complication of anesthesia   . Coronary artery disease   . Hypertension   . Diabetes mellitus without complication   . Chronic kidney disease     Hx of stones  . Headache(784.0)     migraines  . Cancer     skin ca  . Arthritis    Past Surgical History  Procedure Laterality Date  . Cardiac catheterization    . Coronary angioplasty    . Lithotripsy    . Colonoscopy N/A 06/15/2013    Procedure: COLONOSCOPY;  Surgeon: Beryle Beams, MD;  Location: [facility];  Service: Endoscopy;  Laterality: N/A;   Family History  Problem Relation Age of Onset  . Heart disease Father   . Heart disease Paternal Grandmother    History  Substance Use Topics  . Smoking status: Never Smoker   . Smokeless tobacco: Never Used  . Alcohol Use: Yes     Comment: ocas    Review of Systems  Constitutional: Negative for fever and chills.  HENT: Positive for nosebleeds. Negative for congestion, ear pain and mouth sores.   Neurological: Negative for dizziness, weakness, light-headedness and headaches.      Allergies  Amaryl; Skelaxin; Metformin and related; Levofloxacin; and Lisinopril  Home  Medications   Prior to Admission medications   Medication Sig Start Date End Date Taking? Authorizing Provider  amLODipine (NORVASC) 10 MG tablet Take 1 tablet (10 mg total) by mouth daily. 03/29/14  Yes Robyn Haber, MD  aspirin 81 MG tablet Take 81 mg by mouth daily.   Yes Historical Provider, MD  atorvastatin (LIPITOR) 80 MG tablet TAKE 1 TABLET DAILY 08/20/14  Yes Robyn Haber, MD  brimonidine-timolol (COMBIGAN) 0.2-0.5 % ophthalmic solution Place 1 drop into both eyes 2 (two) times daily.   Yes Historical Provider, MD  canagliflozin (INVOKANA) 100 MG TABS tablet Take 100 mg by mouth.   Yes Historical Provider, MD  Cholecalciferol (VITAMIN D-3 PO) Take 1 tablet by mouth 2 (two) times a week.    Yes Historical Provider, MD  fish oil-omega-3 fatty acids 1000 MG capsule Take 2 g by mouth daily.   Yes Historical Provider, MD  gemfibrozil (LOPID) 600 MG tablet TAKE 1 TABLET DAILY 08/20/14  Yes Robyn Haber, MD  latanoprost (XALATAN) 0.005 % ophthalmic solution Place 1 drop into both eyes at bedtime.    Yes Historical Provider, MD  metFORMIN (GLUCOPHAGE-XR) 500 MG 24 hr tablet bid Patient taking differently: Take 500 mg by mouth daily with breakfast.  11/29/13  Yes Robyn Haber, MD  multivitamin-lutein [facility]) CAPS capsule Take 1 capsule by mouth 2 (two) times daily.   Yes Historical Provider, MD  mupirocin ointment Drue Stager)  2 % Place 1 application into the nose 2 (two) times daily. Patient taking differently: Apply 1 application topically 2 (two) times daily as needed (skin infection).  11/22/14  Yes Robyn Haber, MD  nortriptyline (PAMELOR) 25 MG capsule Take 25-50 mg by mouth 2 (two) times daily.  11/22/14  Yes Historical Provider, MD  propranolol ER (INDERAL LA) 60 MG 24 hr capsule TAKE 1 CAPSULE TWICE A DAY 11/19/14  Yes Robyn Haber, MD  SUMAtriptan (IMITREX) 50 MG tablet Take 1 tablet (50 mg total) by mouth every 2 (two) hours as needed for migraine. 09/01/12 12/05/14 Yes  Robyn Haber, MD  clotrimazole-betamethasone (LOTRISONE) cream Apply 1 application topically 2 (two) times daily. Patient not taking: Reported on 12/05/2014 10/31/13   Robyn Haber, MD  Fingerstix Lancets MISC To check blood sugar once daily dx code 250.01 12/27/12   Areta Haber Dunn, PA-C  glucose blood test strip Use as instructed 11/29/13   Robyn Haber, MD  glucose monitoring kit (FREESTYLE) monitoring kit 1 each by Does not apply route as needed for other. Needs Accucheck Avia meter to check glucose once daily dx 250.01 12/27/12   Areta Haber Dunn, PA-C  sitaGLIPtin (JANUVIA) 100 MG tablet Take 1 tablet (100 mg total) by mouth daily. 11/22/14   Robyn Haber, MD  zoster vaccine live, PF, (ZOSTAVAX) 93716 UNT/0.65ML injection Inject 19,400 Units into the skin once. 11/22/14   Robyn Haber, MD   BP 112/66 mmHg  Pulse 77  Temp(Src) 96.4 F (35.8 C) (Axillary)  Resp 18  SpO2 100% Physical Exam  Constitutional: He is oriented to person, place, and time. He appears well-developed and well-nourished. No distress.  HENT:  Head: Normocephalic and atraumatic.  Mild active bleeding from left nostril. Dried up blood in right nostril and mouth.   Eyes: Conjunctivae are normal.  Neck: Neck supple.  Cardiovascular: Normal rate, regular rhythm and normal heart sounds.   Pulmonary/Chest: Effort normal. No respiratory distress. He has no wheezes. He has no rales.  Musculoskeletal: He exhibits no edema.  Neurological: He is alert and oriented to person, place, and time.  Skin: Skin is warm and dry.  Nursing note and vitals reviewed.   ED Course  Procedures (including critical care time) Labs Review Labs Reviewed  I-STAT CHEM 8, ED - Abnormal; Notable for the following:    Glucose, Bld 132 (*)    All other components within normal limits  CBC WITH DIFFERENTIAL  PROTIME-INR    Imaging Review No results found.   EKG Interpretation None      MDM   Final diagnoses:  Epistaxis     Patient with nosebleed, no injuries, no headache, no lightheadedness. Blood pressure and vital signs are normal. Asked patient to blow out all the clots. Afrin applied, pressure applied, will hold for 10 minutes.  Bleeding has resolved. Blood work checked, all normal including platelets, hemoglobin, INR. Patient was monitored for an hour. No more bleeding. Will discharge home with nasal saline, pressure and Afrin as needed if bleeding starts again. Patient admits to using nasal C Pap, and thinks to try them out. Will get a humidifier. Follow up with primary care doctor as needed.  Filed Vitals:   12/05/14 0952 12/05/14 0959 12/05/14 1224  BP:  112/66 115/70  Pulse:  77 75  Temp:  96.4 F (35.8 C) 98.3 F (36.8 C)  TempSrc:  Axillary Oral  Resp:  18 18  SpO2: 100% 100% 100%       Jacobie Stamey A Malachi Suderman,  PA-C 12/05/14 [place], MD 12/05/14 [phone]

## 2014-12-05 NOTE — ED Notes (Signed)
Per EMS: Pt woke up 30 mins ago with epistaxis.  Continues now.  Bleeding from both nares.  Ambulatory to triage.

## 2015-01-04 DIAGNOSIS — I517 Cardiomegaly: Secondary | ICD-10-CM | POA: Diagnosis not present

## 2015-01-04 DIAGNOSIS — Z136 Encounter for screening for cardiovascular disorders: Secondary | ICD-10-CM | POA: Diagnosis not present

## 2015-01-04 DIAGNOSIS — I10 Essential (primary) hypertension: Secondary | ICD-10-CM | POA: Diagnosis not present

## 2015-01-04 DIAGNOSIS — I2510 Atherosclerotic heart disease of native coronary artery without angina pectoris: Secondary | ICD-10-CM | POA: Diagnosis not present

## 2015-01-22 DIAGNOSIS — I724 Aneurysm of artery of lower extremity: Secondary | ICD-10-CM | POA: Diagnosis not present

## 2015-01-24 DIAGNOSIS — Z136 Encounter for screening for cardiovascular disorders: Secondary | ICD-10-CM | POA: Diagnosis not present

## 2015-01-27 ENCOUNTER — Other Ambulatory Visit: Payer: Self-pay | Admitting: Family Medicine

## 2015-01-28 DIAGNOSIS — M4806 Spinal stenosis, lumbar region: Secondary | ICD-10-CM | POA: Diagnosis not present

## 2015-01-28 DIAGNOSIS — M5137 Other intervertebral disc degeneration, lumbosacral region: Secondary | ICD-10-CM | POA: Diagnosis not present

## 2015-02-05 ENCOUNTER — Other Ambulatory Visit: Payer: Self-pay

## 2015-02-05 MED ORDER — METFORMIN HCL ER 500 MG PO TB24: 500.0000 mg | ORAL_TABLET | Freq: Every day | ORAL | Status: DC

## 2015-02-05 NOTE — Telephone Encounter (Signed)
Called pt to verify that he is only taking the 500 mg Metformin ER tabs QD and not BID, both directions are on Rx. Pt stated that somewhere along the line the reg tabs were sent in for BID in error and that is what confused the current sig. I corrected sig to the QD that pt has been taking historically and per pt report, and sent to pharm.

## 2015-02-11 ENCOUNTER — Encounter: Payer: Self-pay | Admitting: Family Medicine

## 2015-02-14 ENCOUNTER — Other Ambulatory Visit: Payer: Self-pay | Admitting: Family Medicine

## 2015-02-19 DIAGNOSIS — M5412 Radiculopathy, cervical region: Secondary | ICD-10-CM | POA: Diagnosis not present

## 2015-02-19 DIAGNOSIS — M5136 Other intervertebral disc degeneration, lumbar region: Secondary | ICD-10-CM | POA: Diagnosis not present

## 2015-02-19 DIAGNOSIS — M5137 Other intervertebral disc degeneration, lumbosacral region: Secondary | ICD-10-CM | POA: Diagnosis not present

## 2015-02-19 DIAGNOSIS — M4806 Spinal stenosis, lumbar region: Secondary | ICD-10-CM | POA: Diagnosis not present

## 2015-02-19 DIAGNOSIS — M545 Low back pain: Secondary | ICD-10-CM | POA: Diagnosis not present

## 2015-02-19 DIAGNOSIS — M47812 Spondylosis without myelopathy or radiculopathy, cervical region: Secondary | ICD-10-CM | POA: Diagnosis not present

## 2015-02-19 DIAGNOSIS — M546 Pain in thoracic spine: Secondary | ICD-10-CM | POA: Diagnosis not present

## 2015-02-21 IMAGING — CT CT T SPINE W/ CM
3 of 12 series · 8 of 33 positions shown, 9 images · non-contrast
Comparison: none

CLINICAL DATA: Thoracic, cervical, and lumbar spondylosis. Neck
pain, mid back pain, and lower back pain.
TECHNIQUE: Contiguous axial images were obtained through the Cervical,
Thoracic, and Lumbar spine after the intrathecal infusion of
infusion. Coronal and sagittal reconstructions were obtained of the
axial image sets.

[Series 2: t & l spine bone · axial · 0.31mm/px · z∈[-491,-206]mm · 3 of 230 slices shown, 4 images]
[im 58/230  soft-tissue]
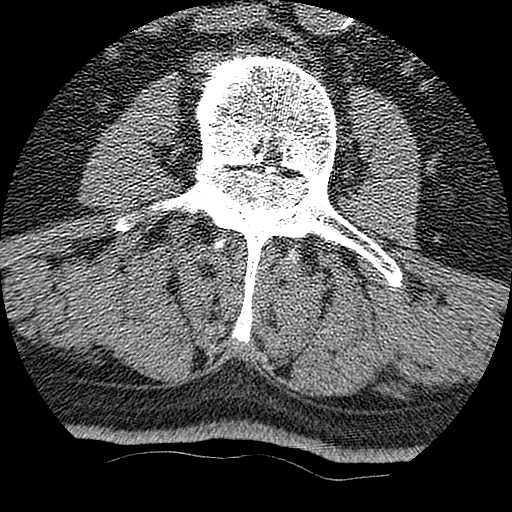
[im 58/230  bone]
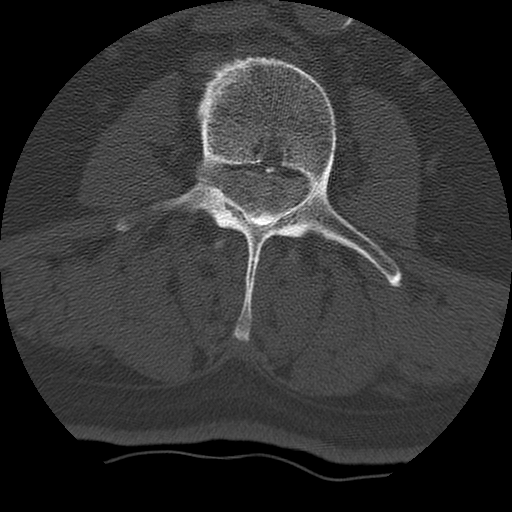
[im 115/230  bone]
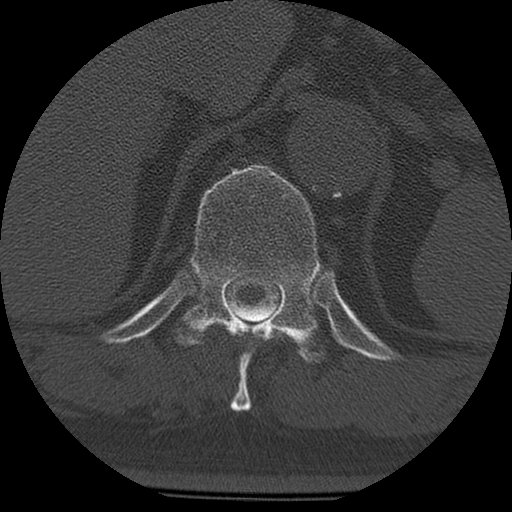
[im 172/230  bone]
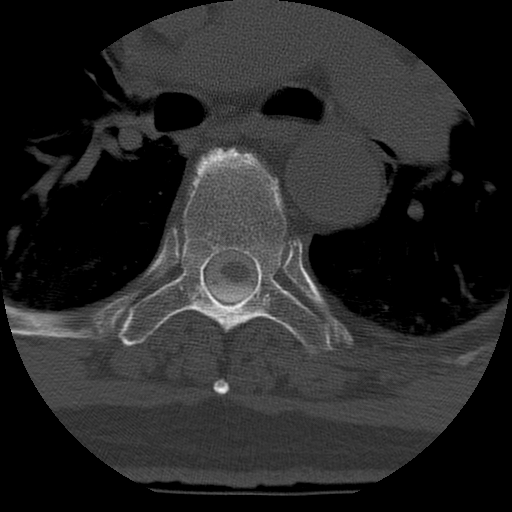

[Series 3: t & l spine · axial · 0.31mm/px · z∈[-491,-206]mm · 3 of 230 slices shown]
[im 58/230  bone]
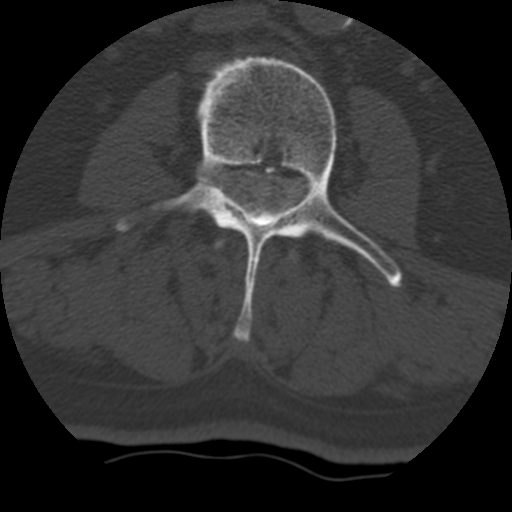
[im 115/230  bone]
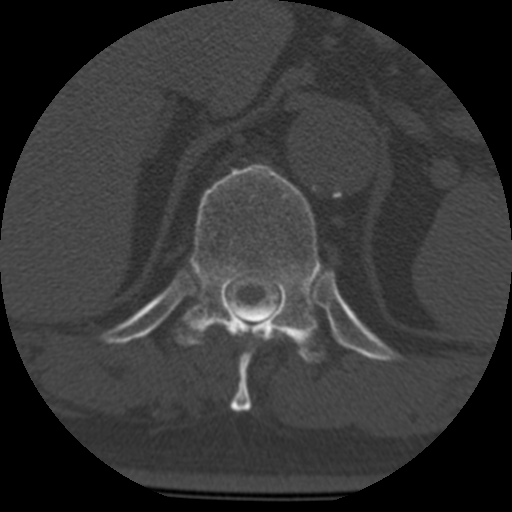
[im 172/230  bone]
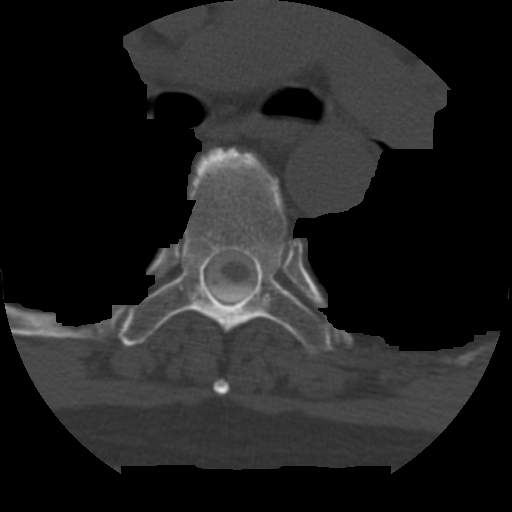

[Series 108: sag l · sagittal · 0.53mm/px · 2 of 51 slices shown]
[im 17/51  bone]
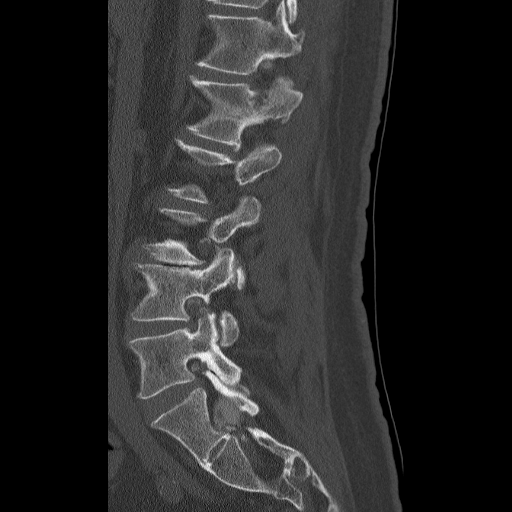
[im 34/51  bone]
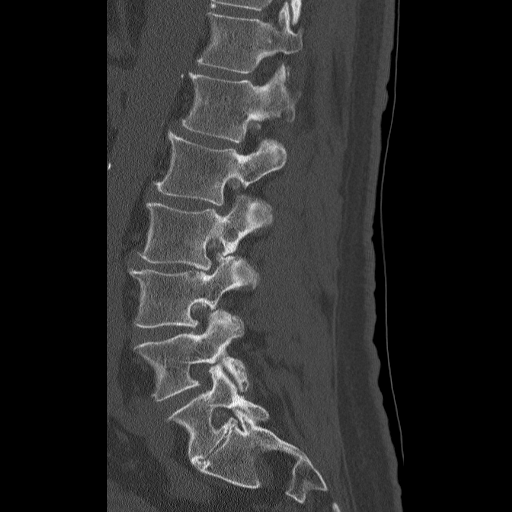

[8 of 33 positions shown; findings below may reference images not displayed]

FLUOROSCOPY TIME:  3 min and 35 seconds

PROCEDURE:
LUMBAR PUNCTURE FOR CERVICAL LUMBAR AND THORACIC MYELOGRAM

CERVICAL AND LUMBAR AND THORACIC MYELOGRAM

CT CERVICAL MYELOGRAM

CT LUMBAR MYELOGRAM

CT THORACIC MYELOGRAM

After thorough discussion of risks and benefits of the procedure
including bleeding, infection, injury to nerves, blood vessels,
adjacent structures as well as headache and CSF leak, written and
oral informed consent was obtained. Consent was obtained by Dr. Jaylon
Bomar.

Patient was positioned prone on the fluoroscopy table. Local
anesthesia was provided with 1% lidocaine without epinephrine after
prepped and draped in the usual sterile fashion. Puncture was
performed at L2-L3 (after unsuccessful attempt at L4-5 and L3-L4)
using a 3 1/2 inch 22-gauge spinal needle via midline approach.
Using a single pass through the dura, the needle was placed within
the thecal sac, with return of clear CSF. 10 mL 2mnipaque-HRR was
injected into the thecal sac, with normal opacification of the nerve
roots and cauda equina consistent with free flow within the
subarachnoid space. The patient was then moved to the trendelenburg
position and contrast flowed into the Thoracic and Cervical spine
regions.

I personally performed the lumbar puncture and administered the
intrathecal contrast. I also personally supervised acquisition of
the myelogram images.
FINDINGS: CERVICAL AND THORACIC LUMBAR MYELOGRAM FINDINGS:

In the LUMBAR region, there is moderate to severe stenosis at L3-L4.
There is severe disc space narrowing at this level. There is no
dynamic instability, but the stenosis appears worse in extension.
Mild dynamic instability is noted L2-L3 related to facet disease
with retrolisthesis of 2 mm in neutral, 4 mm in flexion, and 4 mm in
extension. Mild stenosis is present at the L2-3 level. Because of a
paucity of contrast, due to severe stenosis, visualization of the
spinal subarachnoid space below L3-4 is limited. Moderate stenosis
is observed at L4-5 and L5-S1 however.

In the THORACIC region, there is no significant ventral defect or
spinal stenosis. No cord compression is seen.

In the CERVICAL region, there is mild disc space narrowing at C5-6
and C6-7. Osteophyte formation is present at both levels. Extradural
defects are suspected at C4-5 on the RIGHT, C5-6 bilaterally, and
C6-7 on the LEFT. There is no dynamic instability.

CT CERVICAL MYELOGRAM FINDINGS:

Anatomic alignment. No prevertebral or paraspinous masses.
Craniocervical junction unremarkable.

The individual disc spaces were examined as follows:

C2-3: Asymmetric uncinate spurring on the RIGHT. RIGHT-sided facet
arthropathy. Possible RIGHT C3 nerve root impingement.

C3-4: Mild BILATERAL uncinate spurring is worse on the RIGHT. There
is mild annular bulging. Asymmetric RIGHT-sided facet arthropathy.
RIGHT C4 nerve root impingement is possible.

C4-5: Marked uncinate spurring on the RIGHT. Disc space narrowing.
Mild annular bulging centrally. Severe foraminal narrowing with
RIGHT C5 nerve root impingement.

C5-6: Central osteophyte formation. BILATERAL uncinate spurring
slightly worse on the RIGHT. Significant foraminal narrowing
affecting the RIGHT greater than LEFT C6 nerve roots. Mild canal
stenosis without cord compression.

C6-7: Mild annular bulging. Vacuum disc phenomenon. LEFT-sided
uncinate spurring significantly narrows the foramen. LEFT C7 nerve
root impingement is likely. Mild canal stenosis without cord
compression.

C7-T1: Unremarkable disc space. BILATERAL facet arthropathy. No
impingement.

CT LUMBAR MYELOGRAM FINDINGS:

No prevertebral or paraspinous masses. Atherosclerotic calcification
of the aorta not clearly aneurysmal. Anatomic alignment.
Transitional interspace with rudimentary S1-S2 disc space. Conus
unremarkable.

The individual disc spaces were examined with axial images as
follows:

L1-L2:  Mild facet arthropathy.    Mild bulge.  No impingement.

L2-L3: Shallow central protrusion. Mild facet and moderate
ligamentum flavum hypertrophy. BILATERAL L3 nerve root effacement is
possible. Mild BILATERAL foraminal narrowing without impingement.

L3-L4: Severe disc space narrowing. Advanced facet and ligamentum
flavum hypertrophy. Severe central canal stenosis. Central and
leftward protrusion. Extensive osteophytic spurring and facet bony
overgrowth narrows both the lateral recess and foramina. LEFT
greater than RIGHT L4 and L3 nerve root impingement are noted.

L4-L5: Trace retrolisthesis. Mild bulge. Moderate central canal
stenosis. Facet and ligamentum flavum hypertrophy. BILATERAL
foraminal narrowing. LEFT greater than RIGHT L4 and L5 nerve root
impingement are noted. Slight rightward subdural contrast collection
reflective of attempted lumbar puncture.

L5-S1: Mild bulge. Mild to moderate stenosis. Moderate facet and
ligamentum flavum hypertrophy. No S1 nerve root impingement in the
canal. BILATERAL foraminal narrowing could affect either L5 nerve
root.

CT THORACIC MYELOGRAM FINDINGS:

No prevertebral or paraspinous masses. No lung masses. Minor
atheromatous change of the aorta. Coronary artery calcification. No
worrisome osseous lesions.

Normal cord size throughout. No intraspinal mass lesions. No
thoracic disc protrusion or spinal stenosis. Mild anterior
osteophytic spurring at T1-2, T7-8, T8-9, and T9-10
IMPRESSION: Transitional anatomy.  See discussion above.

Multilevel cervical spondylosis, worst at C4-5 on the RIGHT, C5-6
bilaterally, and C6-7 on the LEFT.

Unremarkable thoracic spine.

Multilevel lumbar spondylosis, with severe stenosis at L3-4
affecting the LEFT greater than RIGHT nerve roots. See discussion
above.

Mild dynamic instability L2-L3, exacerbating posterior element
hypertrophy and central protrusion.

## 2015-02-27 DIAGNOSIS — H4011X2 Primary open-angle glaucoma, moderate stage: Secondary | ICD-10-CM | POA: Diagnosis not present

## 2015-02-27 DIAGNOSIS — H2513 Age-related nuclear cataract, bilateral: Secondary | ICD-10-CM | POA: Diagnosis not present

## 2015-02-27 DIAGNOSIS — H4011X3 Primary open-angle glaucoma, severe stage: Secondary | ICD-10-CM | POA: Diagnosis not present

## 2015-02-28 ENCOUNTER — Ambulatory Visit (INDEPENDENT_AMBULATORY_CARE_PROVIDER_SITE_OTHER): Payer: Medicare Other | Admitting: Family Medicine

## 2015-02-28 ENCOUNTER — Encounter: Payer: Self-pay | Admitting: Family Medicine

## 2015-02-28 ENCOUNTER — Ambulatory Visit: Payer: Medicare Other | Admitting: Family Medicine

## 2015-02-28 VITALS — BP 124/72 | HR 69 | Temp 97.5°F | Resp 16 | Ht 72.5 in | Wt 210.8 lb

## 2015-02-28 DIAGNOSIS — I10 Essential (primary) hypertension: Secondary | ICD-10-CM | POA: Diagnosis not present

## 2015-02-28 DIAGNOSIS — E119 Type 2 diabetes mellitus without complications: Secondary | ICD-10-CM | POA: Diagnosis not present

## 2015-02-28 DIAGNOSIS — E785 Hyperlipidemia, unspecified: Secondary | ICD-10-CM

## 2015-02-28 DIAGNOSIS — R040 Epistaxis: Secondary | ICD-10-CM | POA: Diagnosis not present

## 2015-02-28 DIAGNOSIS — G43001 Migraine without aura, not intractable, with status migrainosus: Secondary | ICD-10-CM

## 2015-02-28 LAB — COMPREHENSIVE METABOLIC PANEL
ALT: 28 U/L (ref 0–53)
AST: 20 U/L (ref 0–37)
Albumin: 4.4 g/dL (ref 3.5–5.2)
Alkaline Phosphatase: 69 U/L (ref 39–117)
BUN: 14 mg/dL (ref 6–23)
CO2: 30 mEq/L (ref 19–32)
Calcium: 10.1 mg/dL (ref 8.4–10.5)
Chloride: 102 mEq/L (ref 96–112)
Creat: 0.8 mg/dL (ref 0.50–1.35)
Glucose, Bld: 113 mg/dL — ABNORMAL HIGH (ref 70–99)
Potassium: 3.5 mEq/L (ref 3.5–5.3)
Sodium: 142 mEq/L (ref 135–145)
Total Bilirubin: 0.4 mg/dL (ref 0.2–1.2)
Total Protein: 6.5 g/dL (ref 6.0–8.3)

## 2015-02-28 LAB — GLUCOSE, POCT (MANUAL RESULT ENTRY): POC Glucose: 112 mg/dl — AB (ref 70–99)

## 2015-02-28 LAB — MICROALBUMIN, URINE: Microalb, Ur: 0.8 mg/dL (ref <2.0)

## 2015-02-28 LAB — POCT GLYCOSYLATED HEMOGLOBIN (HGB A1C): Hemoglobin A1C: 5.9

## 2015-02-28 MED ORDER — GEMFIBROZIL 600 MG PO TABS: ORAL_TABLET | ORAL | Status: DC

## 2015-02-28 MED ORDER — METFORMIN HCL ER 500 MG PO TB24: 500.0000 mg | ORAL_TABLET | Freq: Every day | ORAL | Status: DC

## 2015-02-28 MED ORDER — PROPRANOLOL HCL ER 60 MG PO CP24: 60.0000 mg | ORAL_CAPSULE | Freq: Two times a day (BID) | ORAL | Status: DC

## 2015-02-28 MED ORDER — SITAGLIPTIN PHOSPHATE 100 MG PO TABS: 100.0000 mg | ORAL_TABLET | Freq: Every day | ORAL | Status: DC

## 2015-02-28 MED ORDER — ATORVASTATIN CALCIUM 80 MG PO TABS: ORAL_TABLET | ORAL | Status: DC

## 2015-02-28 MED ORDER — AMLODIPINE BESYLATE 10 MG PO TABS: 10.0000 mg | ORAL_TABLET | Freq: Every day | ORAL | Status: DC

## 2015-02-28 NOTE — Progress Notes (Signed)
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 He is here for diabetes follow-up along with blood pressure evaluation  He's had 2 episodes of epistaxis which has been quite significant the last several months. One precipitated an emergency room visit during which they gave him Afrin and the nasal bleeding subsided. These episodes were spontaneous without any preceding trauma.  Patient is having no trouble with numbness in his feet, no skin lesions, hypoglycemic episodes. He is careful about checking his sugar regularly and finds that he feels best when his blood sugars between 110 and 140. Occasionally his blood sugar gets up as high as 150 and he says he feels back.  Objective: Adult male appearing stated age, articulate HEENT: Unremarkable with the exception of some papular changes in the right nasal septum Chest: Clear Heart: Regular no murmur Skin: No significant changes Results for orders placed or performed in visit on 02/28/15  POCT glycosylated hemoglobin (Hb A1C)  Result Value Ref Range   Hemoglobin A1C 5.9   POCT glucose (manual entry)  Result Value Ref Range   POC Glucose 112 (A) 70 - 99 mg/dl   Assessment: Patient seems doing well with his diabetes. I am concerned about his epistaxis. I will arrange for a ENT evaluation. His blood sugar seems to be well-controlled, and he will continue getting his Januvia from [place] sources.    ICD-9-CM ICD-10-CM   1. Type 2 diabetes mellitus, controlled 250.00 E11.9 POCT glycosylated hemoglobin (Hb A1C)     POCT glucose (manual entry)     Comprehensive metabolic panel     sitaGLIPtin (JANUVIA) 100 MG tablet     metFORMIN (GLUCOPHAGE-XR) 500 MG 24 hr tablet     Microalbumin, urine  2. Essential hypertension 401.9 I10 amLODipine (NORVASC) 10 MG tablet  3. Migraine without aura and with status migrainosus, not intractable 346.12 G43.001 propranolol ER (INDERAL LA) 60 MG 24 hr  capsule  4. Hyperlipidemia 272.4 E78.5 gemfibrozil (LOPID) 600 MG tablet     atorvastatin (LIPITOR) 80 MG tablet  5. Epistaxis 784.7 R04.0 Ambulatory referral to ENT     Signed, Robyn Haber, MD

## 2015-02-28 NOTE — Patient Instructions (Addendum)
[email or web address] [phone]    Nosebleed Nosebleeds can be caused by many conditions, including trauma, infections, polyps, foreign bodies, dry mucous membranes or climate, medicines, and air conditioning. Most nosebleeds occur in the front of the nose. Because of this location, most nosebleeds can be controlled by pinching the nostrils gently and continuously for at least 10 to 20 minutes. The long, continuous pressure allows enough time for the blood to clot. If pressure is released during that 10 to 20 minute time period, the process may have to be started again. The nosebleed may stop by itself or quit with pressure, or it may need concentrated heating (cautery) or pressure from packing. HOME CARE INSTRUCTIONS   If your nose was packed, try to maintain the pack inside until your health care provider removes it. If a gauze pack was used and it starts to fall out, gently replace it or cut the end off. Do not cut if a balloon catheter was used to pack the nose. Otherwise, do not remove unless instructed.  Avoid blowing your nose for 12 hours after treatment. This could dislodge the pack or clot and start the bleeding again.  If the bleeding starts again, sit up and bend forward, gently pinching the front half of your nose continuously for 20 minutes.  If bleeding was caused by dry mucous membranes, use over-the-counter saline nasal spray or gel. This will keep the mucous membranes moist and allow them to heal. If you must use a lubricant, choose the water-soluble variety. Use it only sparingly and not within several hours of lying down.  Do not use petroleum jelly or mineral oil, as these may drip into the lungs and cause serious problems.  Maintain humidity in your home by using less air conditioning or by using a humidifier.  Do not use aspirin or medicines which make bleeding more likely. Your health care provider can give you recommendations on this.  Resume normal activities as  you are able, but try to avoid straining, lifting, or bending at the waist for several days.  If the nosebleeds become recurrent and the cause is unknown, your health care provider may suggest laboratory tests. SEEK MEDICAL CARE IF: You have a fever. SEEK IMMEDIATE MEDICAL CARE IF:   Bleeding recurs and cannot be controlled.  There is unusual bleeding from or bruising on other parts of the body.  Nosebleeds continue.  There is any worsening of the condition which originally brought you in.  You become light-headed, feel faint, become sweaty, or vomit blood. MAKE SURE YOU:   Understand these instructions.  Will watch your condition.  Will get help right away if you are not doing well or get worse. Document Released: 08/12/2005 Document Revised: 03/19/2014 Document Reviewed: 10/03/2009 [facility] Patient Information 2015 [place], [place]. This information is not intended to replace advice given to you by your health care provider. Make sure you discuss any questions you have with your health care provider.

## 2015-03-01 ENCOUNTER — Encounter: Payer: Self-pay | Admitting: *Deleted

## 2015-03-18 ENCOUNTER — Other Ambulatory Visit: Payer: Self-pay | Admitting: Family Medicine

## 2015-03-29 ENCOUNTER — Other Ambulatory Visit: Payer: Self-pay | Admitting: Family Medicine

## 2015-03-29 DIAGNOSIS — R040 Epistaxis: Secondary | ICD-10-CM

## 2015-04-02 DIAGNOSIS — R040 Epistaxis: Secondary | ICD-10-CM | POA: Diagnosis not present

## 2015-04-02 DIAGNOSIS — J342 Deviated nasal septum: Secondary | ICD-10-CM | POA: Diagnosis not present

## 2015-04-02 DIAGNOSIS — J3089 Other allergic rhinitis: Secondary | ICD-10-CM | POA: Diagnosis not present

## 2015-04-13 ENCOUNTER — Other Ambulatory Visit: Payer: Self-pay | Admitting: Family Medicine

## 2015-04-16 ENCOUNTER — Other Ambulatory Visit: Payer: Self-pay

## 2015-04-16 DIAGNOSIS — E119 Type 2 diabetes mellitus without complications: Secondary | ICD-10-CM

## 2015-04-16 MED ORDER — METFORMIN HCL ER 500 MG PO TB24: 500.0000 mg | ORAL_TABLET | Freq: Every day | ORAL | Status: DC

## 2015-04-18 ENCOUNTER — Ambulatory Visit (INDEPENDENT_AMBULATORY_CARE_PROVIDER_SITE_OTHER): Payer: Medicare Other | Admitting: Family Medicine

## 2015-04-18 ENCOUNTER — Ambulatory Visit (INDEPENDENT_AMBULATORY_CARE_PROVIDER_SITE_OTHER): Payer: Medicare Other

## 2015-04-18 VITALS — BP 100/74 | HR 79 | Temp 98.3°F | Resp 17 | Ht 73.0 in | Wt 215.4 lb

## 2015-04-18 DIAGNOSIS — M2550 Pain in unspecified joint: Secondary | ICD-10-CM

## 2015-04-18 DIAGNOSIS — M79642 Pain in left hand: Secondary | ICD-10-CM | POA: Diagnosis not present

## 2015-04-18 DIAGNOSIS — M79609 Pain in unspecified limb: Secondary | ICD-10-CM | POA: Diagnosis not present

## 2015-04-18 LAB — URIC ACID: Uric Acid, Serum: 5 mg/dL (ref 4.0–7.8)

## 2015-04-18 LAB — POCT SEDIMENTATION RATE: POCT SED RATE: 6 mm/hr (ref 0–22)

## 2015-04-18 NOTE — Patient Instructions (Signed)
Tylenol as needed, hydrocodone if needed for more severe pain. If pain persists and labs are normal, let me know and I can refer you to a hand specialist for further evaluation. If the tests are elevated, further testing or rheumatologist evaluation may be helpful.

## 2015-04-18 NOTE — Progress Notes (Addendum)
Subjective:  This chart was scribed for Tony Ray, MD by Moises Blood, Medical Scribe. This patient was seen in room 9 and the patient's care was started 3:01 PM.    Patient ID: [name], male    DOB: [birth year], 69 y.o.   MRN: [id number]  HPI [name] is a 69 y.o. male  He has pain in the left pinky and left thumb. 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 It feels like it pops and locks when he places his finger on the viola. He had a concert on April 71GG, and there has been more pain since then. Last week, he felt severe pain in between his index and middle knuckle in his left hand. The pain has radiated to the thumb starting 3-4 days ago. Patient denies swelling in the area. He has some pain in his left big toe, but he believes it is gout.   He has a stent in his heart with angina in the past so he is unable to take NSAID's. He takes Tylenol and nortriptyline for his chronic back pain. His last uric acid test was 5.2 back in Sept 2015. Patient is right handed. His PCP is Dr. Joseph Art.      Patient Active Problem List   Diagnosis Date Noted  . HTN (hypertension) 02/22/2012  . Dyslipidemia 02/22/2012  . DM 10/14/2010  . HYPERLIPIDEMIA 10/14/2010  . OBSTRUCTIVE SLEEP APNEA 10/14/2010  . MIGRAINE HEADACHE 10/14/2010  . GLAUCOMA 10/14/2010  . HYPERTENSION 10/14/2010  . CAD 10/14/2010  . DIVERTICULAR DISEASE 10/14/2010  . DYSPNEA 10/14/2010   Past Medical History  Diagnosis Date  . Complication of anesthesia   . Coronary artery disease   . Hypertension   . Diabetes mellitus without complication   . Chronic kidney disease     Hx of stones  . Headache(784.0)     migraines  . Cancer     skin ca  . Arthritis    Past Surgical History  Procedure Laterality Date  . Cardiac catheterization    . Coronary angioplasty    . Lithotripsy    . Colonoscopy N/A 06/15/2013    Procedure: COLONOSCOPY;  Surgeon: Beryle Beams, MD;   Location: [facility];  Service: Endoscopy;  Laterality: N/A;   Allergies  Allergen Reactions  . Amaryl Other (See Comments)    Gives pt migraines  . Skelaxin [Metaxalone] Rash    Cherry red rash with sloughing off of skin at left upper chest/shoulder  . Metformin And Related Diarrhea  . Levofloxacin Nausea Only  . Lisinopril Other (See Comments)    cough   Prior to Admission medications   Medication Sig Start Date End Date Taking? Authorizing Provider  amLODipine (NORVASC) 10 MG tablet Take 1 tablet (10 mg total) by mouth daily. 02/28/15  Yes Robyn Haber, MD  aspirin 81 MG tablet Take 81 mg by mouth daily.   Yes Historical Provider, MD  atorvastatin (LIPITOR) 80 MG tablet TAKE 1 TABLET DAILY 02/28/15  Yes Robyn Haber, MD  brimonidine-timolol (COMBIGAN) 0.2-0.5 % ophthalmic solution Place 1 drop into both eyes 2 (two) times daily.   Yes Historical Provider, MD  Cholecalciferol (VITAMIN D-3 PO) Take 1 tablet by mouth 2 (two) times a week.    Yes Historical Provider, MD  clotrimazole-betamethasone (LOTRISONE) cream Apply 1 application topically 2 (two) times daily. 10/31/13  Yes Robyn Haber, MD  fish oil-omega-3 fatty acids 1000 MG capsule Take 2 g  by mouth daily.   Yes Historical Provider, MD  gabapentin (NEURONTIN) 100 MG capsule Take 100 mg by mouth 2 (two) times daily. Takes 2 pills bid   Yes Historical Provider, MD  gemfibrozil (LOPID) 600 MG tablet TAKE 1 TABLET DAILY. 02/28/15  Yes Robyn Haber, MD  glucose blood (FREESTYLE LITE) test strip Test blood sugar daily. Dx code: E11.9 02/15/15  Yes Robyn Haber, MD  glucose monitoring kit (FREESTYLE) monitoring kit 1 each by Does not apply route as needed for other. Needs Accucheck Avia meter to check glucose once daily dx 250.01 12/27/12  Yes Ryan M Dunn, PA-C  Lancets (FREESTYLE) lancets Test blood sugar daily. Dx code: E11.9 02/15/15  Yes Robyn Haber, MD  latanoprost (XALATAN) 0.005 % ophthalmic solution Place 1 drop into  both eyes at bedtime.    Yes Historical Provider, MD  metFORMIN (GLUCOPHAGE-XR) 500 MG 24 hr tablet Take 1 tablet (500 mg total) by mouth daily with breakfast. 04/16/15  Yes Robyn Haber, MD  multivitamin-lutein [employer]) CAPS capsule Take 1 capsule by mouth 2 (two) times daily.   Yes Historical Provider, MD  mupirocin ointment (BACTROBAN) 2 % Place 1 application into the nose 2 (two) times daily. Patient taking differently: Apply 1 application topically 2 (two) times daily as needed (skin infection).  11/22/14  Yes Robyn Haber, MD  nortriptyline (PAMELOR) 25 MG capsule Take 25-50 mg by mouth 2 (two) times daily.  11/22/14  Yes Historical Provider, MD  propranolol ER (INDERAL LA) 60 MG 24 hr capsule Take 1 capsule (60 mg total) by mouth 2 (two) times daily. 02/28/15  Yes Robyn Haber, MD  propranolol ER (INDERAL LA) 60 MG 24 hr capsule TAKE 1 CAPSULE TWICE A DAY 03/21/15  Yes Robyn Haber, MD  sitaGLIPtin (JANUVIA) 100 MG tablet Take 1 tablet (100 mg total) by mouth daily. 02/28/15  Yes Robyn Haber, MD  zoster vaccine live, PF, (ZOSTAVAX) 20254 UNT/0.65ML injection Inject 19,400 Units into the skin once. 11/22/14  Yes Robyn Haber, MD  SUMAtriptan (IMITREX) 50 MG tablet Take 1 tablet (50 mg total) by mouth every 2 (two) hours as needed for migraine. 09/01/12 12/05/14  Robyn Haber, MD   History   Social History  . Marital Status: Married    Spouse Name: N/A  . Number of Children: N/A  . Years of Education: N/A   Occupational History  . Not on file.   Social History Main Topics  . Smoking status: Never Smoker   . Smokeless tobacco: Never Used  . Alcohol Use: Yes     Comment: ocas  . Drug Use: No  . Sexual Activity: Not on file   Other Topics Concern  . Not on file   Social History Narrative        Review of Systems  Musculoskeletal: Positive for arthralgias (left pinky, and thumb).       Objective:   Physical Exam  Constitutional: He is oriented to  person, place, and time. He appears well-developed and well-nourished. No distress.  HENT:  Head: Normocephalic and atraumatic.  Eyes: EOM are normal. Pupils are equal, round, and reactive to light.  Neck: Neck supple.  Cardiovascular: Normal rate.   Pulmonary/Chest: Effort normal. No respiratory distress.  Musculoskeletal: Normal range of motion.  left thumb: full ROM, skin intact, no warmth and no erythema. tender throughout first metacarpal into the [facility] scaphoid  Left 5th finger: there is no triggering, no tenderness along volar end, slight tenderness along 5th MCP, but full ROM, no  erythema or warmth  Neurological: He is alert and oriented to person, place, and time.  Skin: Skin is warm and dry.  Psychiatric: He has a normal mood and affect. His behavior is normal.  Nursing note and vitals reviewed.   Filed Vitals:   04/18/15 1427  BP: 100/74  Pulse: 79  Temp: 98.3 F (36.8 C)  TempSrc: Oral  Resp: 17  Height: 6' 1" (1.854 m)  Weight: 215 lb 6.4 oz (97.705 kg)  SpO2: 96%    [facility] reading (PRIMARY) by  Dr. Carlota Raspberry: L hand: no fracture, joint spaces overall appear intact.        Assessment & Plan:  RYIN AMBROSIUS is a 69 y.o. male Left hand pain - Plan: DG Hand Complete Left, POCT SEDIMENTATION RATE, Uric Acid  Polyarthralgia - Plan: DG Hand Complete Left, POCT SEDIMENTATION RATE, Uric Acid  Possible OA/overuse vs gout vs other polyarthropathy.   -trial of tylenol, then hydrocodone if needed for more severe pain - SED.   -check uric acid and sed rate.   -consider rheum eval if persistent.   No orders of the defined types were placed in this encounter.   Patient Instructions  Tylenol as needed, hydrocodone if needed for more severe pain. If pain persists and labs are normal, let me know and I can refer you to a hand specialist for further evaluation. If the tests are elevated, further testing or rheumatologist evaluation may be helpful.       I personally  performed the services described in this documentation, which was scribed in my presence. The recorded information has been reviewed and considered, and addended by me as needed.

## 2015-04-19 ENCOUNTER — Encounter: Payer: Self-pay | Admitting: Family Medicine

## 2015-04-19 DIAGNOSIS — M79642 Pain in left hand: Secondary | ICD-10-CM

## 2015-04-25 ENCOUNTER — Ambulatory Visit (INDEPENDENT_AMBULATORY_CARE_PROVIDER_SITE_OTHER): Payer: Medicare Other | Admitting: Neurology

## 2015-04-25 ENCOUNTER — Encounter: Payer: Self-pay | Admitting: Neurology

## 2015-04-25 VITALS — BP 128/72 | HR 72 | Resp 18 | Ht 73.0 in | Wt 212.0 lb

## 2015-04-25 DIAGNOSIS — G4733 Obstructive sleep apnea (adult) (pediatric): Secondary | ICD-10-CM

## 2015-04-25 DIAGNOSIS — R51 Headache: Secondary | ICD-10-CM

## 2015-04-25 DIAGNOSIS — R519 Headache, unspecified: Secondary | ICD-10-CM

## 2015-04-25 DIAGNOSIS — G2581 Restless legs syndrome: Secondary | ICD-10-CM

## 2015-04-25 NOTE — Progress Notes (Signed)
Subjective:    Patient ID: [name] is a 69 y.o. male.  HPI     Tony Age, MD, PhD [facility] [address], [address] [place], [place] [address]  Dear [name],   I saw your patient, [name], upon your kind request in my neurologic clinic today for initial consultation of his sleep disorder, in particular, reevaluation of his prior diagnosis of OSA. The patient is unaccompanied today. As you know, [name] is a 69 year old right-handed gentleman with an underlying medical history of spinal stenosis and back pain, arthritis, diverticulitis, glaucoma, hyperlipidemia, hypertension, and coronary artery disease, status post stent placement, diabetes and overweight state, who was diagnosed with obstructive sleep apnea over 20 years ago. He uses CPAP. He reports that he had a sleep study most recently about 6 or 7 years ago which did not show significant obstructive sleep apnea. However, he still uses CPAP because he feels better with it. He is compliant with treatment. As he recalls, he has a pressure of 8 cm. His machine is about 69 years old. He uses nasal pillows. He wakes up adequately rested for the most part but does have occasional tiredness during the day. He also reports some restless leg symptoms and moving his legs at night. He used to have quite significant leg cramping at night but things improved after he started nortriptyline. He also tried gabapentin which did not help. He has a history of migraine headaches and rarely has to take Imitrex. He has rare morning headaches. He has occasional nocturia but not every night. His Epworth sleepiness score is 11 out of 24 today. His fatigue scores 35 out of 63 today. He suffers from chronic low back pain. He sees [name]. He is due for a steroid injection into his lower back. This has helped in the past. He has never had back surgery. He tries to be in bed between 11 PM and midnight. He does not watch  TV in bed. His rise time is around 8 AM. His weight has remained fairly stable. He drinks about 2 cups of coffee in the morning. He is a nonsmoker. He had a tonsillectomy as a child and also surgery to his uvula. He had a nose injury as a child but had never had it directed. He drinks alcohol rarely. He suspects that his father may have had obstructive sleep apnea. His father died of heart disease. There is prevalent heart disease on his father's side of the family.  I reviewed your office note from 01/04/2015 which you kindly included. He had an echocardiogram on 12/11/2013 which showed normal left ventricular size, mild concentric hypertrophy, normal global wall motion, normal systolic function with a calculated EF of 58%. He had mild mitral regurgitation and mild pulmonary hypertension. He had a Myoview stress test on 11/27/2013 which showed no evidence of ischemia. Prior sleep study results are not available for my review.   His Past Medical History Is Significant For: Past Medical History  Diagnosis Date  . Complication of anesthesia   . Coronary artery disease   . Hypertension   . Diabetes mellitus without complication   . Chronic kidney disease     Hx of stones  . Headache(784.0)     migraines  . Cancer     skin ca  . Arthritis   . Spinal stenosis   . Diverticulitis   . Glaucoma   . Ventricular dilatation     Right  . Hyperlipemia   .  Atherosclerotic heart disease native coronary artery w/angina pectoris     His Past Surgical History Is Significant For: Past Surgical History  Procedure Laterality Date  . Cardiac catheterization    . Coronary angioplasty    . Lithotripsy    . Colonoscopy N/A 06/15/2013    Procedure: COLONOSCOPY;  Surgeon: Tony Beams, MD;  Location: [facility];  Service: Endoscopy;  Laterality: N/A;  . Cholecystectomy  1990  . Appendectomy  1991  . Bowel resection  1985    His Family History Is Significant For: Family History  Problem Relation Bond  of Onset  . Heart disease Father   . Heart attack Father   . Heart disease Paternal Grandmother     His Social History Is Significant For: History   Social History  . Marital Status: Married    Spouse Name: N/A  . Number of Children: 0  . Years of Education: Masters +   Occupational History  . Retired    Social History Main Topics  . Smoking status: Never Smoker   . Smokeless tobacco: Never Used  . Alcohol Use: 0.0 oz/week    0 Standard drinks or equivalent per week     Comment: ocas  . Drug Use: No  . Sexual Activity: Not on file   Other Topics Concern  . None   Social History Narrative   2 cups of coffee a day, soda or tea about 3 times a week.     His Allergies Are:  Allergies  Allergen Reactions  . Amaryl Other (See Comments)    Gives pt migraines  . Skelaxin [Metaxalone] Rash    Cherry red rash with sloughing off of skin at left upper chest/shoulder  . Metformin And Related Diarrhea  . Levofloxacin Nausea Only  . Lisinopril Other (See Comments)    cough  :   His Current Medications Are:  Outpatient Encounter Prescriptions as of 04/25/2015  Medication Sig  . amLODipine (NORVASC) 10 MG tablet Take 1 tablet (10 mg total) by mouth daily.  Marland Kitchen aspirin 81 MG tablet Take 81 mg by mouth daily.  Marland Kitchen atorvastatin (LIPITOR) 80 MG tablet TAKE 1 TABLET DAILY  . brimonidine-timolol (COMBIGAN) 0.2-0.5 % ophthalmic solution Place 1 drop into both eyes 2 (two) times daily.  . Cholecalciferol (VITAMIN D-3 PO) Take 1 tablet by mouth 2 (two) times a week.   . clotrimazole-betamethasone (LOTRISONE) cream Apply 1 application topically 2 (two) times daily.  . fish oil-omega-3 fatty acids 1000 MG capsule Take 2 g by mouth daily.  Marland Kitchen gabapentin (NEURONTIN) 100 MG capsule Take 100 mg by mouth 2 (two) times daily. Takes 2 pills bid  . gemfibrozil (LOPID) 600 MG tablet TAKE 1 TABLET DAILY.  Marland Kitchen glucose blood (FREESTYLE LITE) test strip Test blood sugar daily. Dx code: E11.9  . glucose  monitoring kit (FREESTYLE) monitoring kit 1 each by Does not apply route as needed for other. Needs Accucheck Avia meter to check glucose once daily dx 250.01  . Lancets (FREESTYLE) lancets Test blood sugar daily. Dx code: E11.9  . latanoprost (XALATAN) 0.005 % ophthalmic solution Place 1 drop into both eyes at bedtime.   . metFORMIN (GLUCOPHAGE-XR) 500 MG 24 hr tablet Take 1 tablet (500 mg total) by mouth daily with breakfast.  . multivitamin-lutein (OCUVITE-LUTEIN) CAPS capsule Take 1 capsule by mouth 2 (two) times daily.  . mupirocin ointment (BACTROBAN) 2 % Place 1 application into the nose 2 (two) times daily. (Patient taking differently: Apply 1 application topically  2 (two) times daily as needed (skin infection). )  . nortriptyline (PAMELOR) 25 MG capsule Take 25-50 mg by mouth 2 (two) times daily.   . propranolol ER (INDERAL LA) 60 MG 24 hr capsule Take 1 capsule (60 mg total) by mouth 2 (two) times daily.  . sitaGLIPtin (JANUVIA) 100 MG tablet Take 1 tablet (100 mg total) by mouth daily.  Marland Kitchen zoster vaccine live, PF, (ZOSTAVAX) 88110 UNT/0.65ML injection Inject 19,400 Units into the skin once.  . SUMAtriptan (IMITREX) 50 MG tablet Take 1 tablet (50 mg total) by mouth every 2 (two) hours as needed for migraine.  . [DISCONTINUED] propranolol ER (INDERAL LA) 60 MG 24 hr capsule TAKE 1 CAPSULE TWICE A DAY   No facility-administered encounter medications on file as of 04/25/2015.  :  Review of Systems:  Out of a complete 14 point review of systems, all are reviewed and negative with the exception of these symptoms as listed below:   Review of Systems  Respiratory: Positive for shortness of breath.   Endocrine: Positive for polydipsia.  Neurological:       Memory loss, headache, snoring when not on CPAP, restless legs, Patient has used CPAP for 20+ years, reports needing updated machine.     Objective:  Neurologic Exam  Physical Exam Physical Examination:   Filed Vitals:   04/25/15  1414  BP: 128/72  Pulse: 72  Resp: 18    General Examination: The patient is a very pleasant 69 y.o. male in no acute distress. He appears well-developed and well-nourished and well groomed.   HEENT: Normocephalic, atraumatic, pupils are equal, round and reactive to light and accommodation. Funduscopic exam is normal with sharp disc margins noted. Extraocular tracking is good without limitation to gaze excursion or nystagmus noted. Normal smooth pursuit is noted. Hearing is grossly intact. Tympanic membranes are clear bilaterally. Face is symmetric with normal facial animation and normal facial sensation. Speech is clear with no dysarthria noted. There is no hypophonia. There is no lip, neck/head, jaw or voice tremor. Neck is supple with full range of passive and active motion. There are no carotid bruits on auscultation. Oropharynx exam reveals: mild mouth dryness, adequate dental hygiene and moderate airway crowding, due to a wider and asymmetrical appearing uvula and narrow airway entry as well as redundant soft palate. Tonsils are absent. Mallampati is class II. Tongue protrudes centrally and palate elevates symmetrically. Neck size is 18-1/2 inches. He has a Mild overbite. Nasal inspection reveals  deviated septum to the right.   Chest: Clear to auscultation without wheezing, rhonchi or crackles noted.  Heart: S1+S2+0, regular and normal without murmurs, rubs or gallops noted.   Abdomen: Soft, non-tender and non-distended with normal bowel sounds appreciated on auscultation.  Extremities: There is 1+ pitting edema in the distal lower extremities bilaterally. Pedal pulses are intact.  Skin: Warm and dry without trophic changes noted. There are no varicose veins.  Musculoskeletal: exam reveals no obvious joint deformities, tenderness or joint swelling or erythema.   Neurologically:  Mental status: The patient is awake, alert and oriented in all 4 spheres. His immediate and remote memory,  attention, language skills and fund of knowledge are appropriate. There is no evidence of aphasia, agnosia, apraxia or anomia. Speech is clear with normal prosody and enunciation. Thought process is linear. Mood is normal and affect is normal.  Cranial nerves II - XII are as described above under HEENT exam. In addition: shoulder shrug is normal with equal shoulder height noted. Motor  exam: Normal bulk, strength and tone is noted. There is no drift, tremor or rebound. Romberg is negative. Reflexes are 2+ throughout. Babinski: Toes are flexor bilaterally. Fine motor skills and coordination: intact with normal finger taps, normal hand movements, normal rapid alternating patting, normal foot taps and normal foot agility.  Cerebellar testing: No dysmetria or intention tremor on finger to nose testing. Heel to shin is unremarkable bilaterally. There is no truncal or gait ataxia.  Sensory exam: intact to light touch, pinprick, vibration, temperature sense in the upper and lower extremities.  Gait, station and balance: He stands with difficulty. No veering to one side is noted. No leaning to one side is noted. Posture is Bond-appropriate and stance is narrow based. Gait shows normal stride length and normal pace. No problems turning are noted. He turns in 2 stepsand slowly however. Tandem walk is difficult for him. He has some low back pain without radiation.                Assessment and plan:   In summary, YAMIL OELKE is a very pleasant 69 y.o.-year old male with an underlying medical history of spinal stenosis and back pain, arthritis, diverticulitis, glaucoma, hyperlipidemia, hypertension, and coronary artery disease, status post stent placement, diabetes and overweight state, who was diagnosed with obstructive sleep apnea over 20 years ago. He presents for reevaluation and possible treatment adjustment. In addition, he endorses restless leg symptoms and leg twitching at night.  I had a long chat with  the patient about my findings and the diagnosis of OSA, its prognosis and treatment options. We talked about medical treatments, surgical interventions and non-pharmacological approaches. I explained in particular the risks and ramifications of untreated moderate to severe OSA, especially with respect to developing cardiovascular disease down the Road, including congestive heart failure, difficult to treat hypertension, cardiac arrhythmias, or stroke. Even type 2 diabetes has, in part, been linked to untreated OSA. Symptoms of untreated OSA include daytime sleepiness, memory problems, mood irritability and mood disorder such as depression and anxiety, lack of energy, as well as recurrent headaches, especially morning headaches. We talked about trying to maintain a healthy lifestyle in general, as well as the importance of weight control. I encouraged the patient to eat healthy, exercise daily and keep well hydrated, to keep a scheduled bedtime and wake time routine, to not skip any meals and eat healthy snacks in between meals. I advised the patient not to drive when feeling sleepy. I recommended the following at this time: sleep study with potential positive airway pressure titration. (We will score hypopneas at 4% and split the sleep study into diagnostic and treatment portion, if the estimated. 2 hour AHI is >15/h).   I explained the sleep test procedure to the patient and also outlined possible surgical and non-surgical treatment options of OSA, including the use of a custom-made dental device (which would require a referral to a specialist dentist or oral surgeon), upper airway surgical options, such as pillar implants, radiofrequency surgery, tongue base surgery, and UPPP (which would involve a referral to an ENT surgeon). Rarely, jaw surgery such as mandibular advancement may be considered.  He would be interested in continuation with CPAP treatment. He would be eligible for an updated machine and  supplies, hopefully, post-test. I answered all his questions today and the patient was in agreement. I would like to see him back after the sleep study is completed and encouraged him to call with any interim questions, concerns, problems or  updates.   Thank you very much for allowing me to participate in the care of this nice patient. If I can be of any further assistance to you please do not hesitate to call me at [phone].  Sincerely,   Tony Age, MD, PhD

## 2015-04-25 NOTE — Patient Instructions (Signed)

## 2015-04-29 DIAGNOSIS — M4806 Spinal stenosis, lumbar region: Secondary | ICD-10-CM | POA: Diagnosis not present

## 2015-04-29 DIAGNOSIS — M5136 Other intervertebral disc degeneration, lumbar region: Secondary | ICD-10-CM | POA: Diagnosis not present

## 2015-05-27 DIAGNOSIS — M4806 Spinal stenosis, lumbar region: Secondary | ICD-10-CM | POA: Diagnosis not present

## 2015-05-27 DIAGNOSIS — M546 Pain in thoracic spine: Secondary | ICD-10-CM | POA: Diagnosis not present

## 2015-05-27 DIAGNOSIS — M5136 Other intervertebral disc degeneration, lumbar region: Secondary | ICD-10-CM | POA: Diagnosis not present

## 2015-05-27 DIAGNOSIS — M545 Low back pain: Secondary | ICD-10-CM | POA: Diagnosis not present

## 2015-05-27 DIAGNOSIS — M47812 Spondylosis without myelopathy or radiculopathy, cervical region: Secondary | ICD-10-CM | POA: Diagnosis not present

## 2015-05-27 DIAGNOSIS — M5412 Radiculopathy, cervical region: Secondary | ICD-10-CM | POA: Diagnosis not present

## 2015-05-27 DIAGNOSIS — Z6828 Body mass index (BMI) 28.0-28.9, adult: Secondary | ICD-10-CM | POA: Diagnosis not present

## 2015-05-27 DIAGNOSIS — M5137 Other intervertebral disc degeneration, lumbosacral region: Secondary | ICD-10-CM | POA: Diagnosis not present

## 2015-06-03 ENCOUNTER — Telehealth: Payer: Self-pay

## 2015-06-03 NOTE — Telephone Encounter (Signed)
Patient has an appointment with Dr Caralyn Guile this Friday at [facility]. Patient is requesting to pick up a copy of his xrays on a disc to take with him. He is requesting xrays done on his left hand. Please contact patient at [phone] when ready to be picked up

## 2015-06-03 NOTE — Telephone Encounter (Signed)
Copy sent to Xray.

## 2015-06-03 NOTE — Telephone Encounter (Signed)
Patient has an appointment with Dr Caralyn Guile this Friday at [facility]. Patient is requesting to pick up a copy of his xrays on a disc to take with him. He is requesting xrays done on his left hand. Please contact patient at [phone] when ready to be picked up

## 2015-06-07 DIAGNOSIS — S63657A Sprain of metacarpophalangeal joint of left little finger, initial encounter: Secondary | ICD-10-CM | POA: Diagnosis not present

## 2015-06-10 ENCOUNTER — Other Ambulatory Visit: Payer: Self-pay | Admitting: Family Medicine

## 2015-06-10 ENCOUNTER — Other Ambulatory Visit: Payer: Self-pay | Admitting: Physician Assistant

## 2015-06-17 ENCOUNTER — Telehealth: Payer: Self-pay

## 2015-06-17 DIAGNOSIS — E785 Hyperlipidemia, unspecified: Secondary | ICD-10-CM

## 2015-06-17 NOTE — Telephone Encounter (Signed)
PATIENT STATES 2 OF HIS PRESCRIPTIONS NEED TO BE REWRITTEN. GEMFIBROZIL 600 MG WAS WRITTEN AS #30 WITH NO REFILLS. ALSO ATORVASTATIN 80 MG WAS WRITTEN AS #30 WITH NO REFILLS. HE SAID THEY SHOULD BOTH BE  #90 WITH 3 REFILLS EACH. HIS DOCTOR IS DOCTOR Vandervoort. BEST PHONE [phone] (CELL) PHARMACY CHOICE IS [facility]. [place]

## 2015-06-18 ENCOUNTER — Encounter: Payer: Self-pay | Admitting: Family Medicine

## 2015-06-18 MED ORDER — ATORVASTATIN CALCIUM 80 MG PO TABS
ORAL_TABLET | ORAL | Status: DC
Start: 2015-06-18 — End: 2016-01-01

## 2015-06-18 MED ORDER — GEMFIBROZIL 600 MG PO TABS
ORAL_TABLET | ORAL | Status: DC
Start: 2015-06-18 — End: 2016-01-01

## 2015-06-18 NOTE — Telephone Encounter (Signed)
At last OV in April, Dr L wrote both Rxs for a whole year, but was sent to local pharm. I will re-send the add'l RFs to [facility] for remainder of yr. Notified pt that Rxs were sent for remainder of year. Pt was not happy that his RF was only sent in for 30 day supply. I apologized, and advised not sure why not for 90 days per protocol since last here in April for these meds and pt pointed out it has only been a little over 3 mos. I later saw that pt is a diabetic and even though these are not DM meds, person RFing Rx may have only done 1 mos RFs because pt is due to come back for DM check up. I apologized several times, but pt was still not happy about the service.

## 2015-06-21 ENCOUNTER — Other Ambulatory Visit: Payer: Self-pay | Admitting: Family Medicine

## 2015-06-21 MED ORDER — CHLORTHALIDONE 25 MG PO TABS: 25.0000 mg | ORAL_TABLET | Freq: Every day | ORAL | Status: DC

## 2015-06-26 ENCOUNTER — Ambulatory Visit (INDEPENDENT_AMBULATORY_CARE_PROVIDER_SITE_OTHER): Payer: Medicare Other | Admitting: Neurology

## 2015-06-26 DIAGNOSIS — G4733 Obstructive sleep apnea (adult) (pediatric): Secondary | ICD-10-CM

## 2015-06-26 DIAGNOSIS — R0683 Snoring: Secondary | ICD-10-CM

## 2015-06-26 DIAGNOSIS — G479 Sleep disorder, unspecified: Secondary | ICD-10-CM

## 2015-06-26 DIAGNOSIS — G4761 Periodic limb movement disorder: Secondary | ICD-10-CM

## 2015-06-26 DIAGNOSIS — G4720 Circadian rhythm sleep disorder, unspecified type: Secondary | ICD-10-CM

## 2015-06-27 NOTE — Sleep Study (Signed)
Please see the scanned sleep study interpretation located in the procedure tab in the chart view section.  

## 2015-07-03 ENCOUNTER — Telehealth: Payer: Self-pay | Admitting: Neurology

## 2015-07-03 NOTE — Telephone Encounter (Signed)
Patient seen on 04/25/15 and PSG on 06/26/15:  Please call and notify the patient that the recent sleep study did not show any significant obstructive sleep apnea. There were, however, significant leg movements, which caused arousals, that is, sleep disruption. Unfortunately, he did not sleep well at all, with long periods of wakefulness, no deep sleep, no dream sleep, no supine sleep achieved. Please inform patient that I would like to go over the details of the study during a follow up appointment. Please arrange a followup appointment (please utilize a followu-up slot). Also, route or fax report to PCP and referring MD (Dr. Einar Gip), if other than PCP.  Once you have spoken to patient, you can close this encounter.   Thanks,  Star Age, MD, PhD [facility])

## 2015-07-04 NOTE — Telephone Encounter (Signed)
I spoke to patient and he is aware of results. I was able to make him an appt for this Tuesday. I will fax report to PCP and Dr. Einar Gip

## 2015-07-09 ENCOUNTER — Encounter: Payer: Self-pay | Admitting: Neurology

## 2015-07-09 ENCOUNTER — Ambulatory Visit (INDEPENDENT_AMBULATORY_CARE_PROVIDER_SITE_OTHER): Payer: Medicare Other | Admitting: Neurology

## 2015-07-09 VITALS — BP 124/68 | HR 70 | Resp 18 | Ht 73.0 in | Wt 213.0 lb

## 2015-07-09 DIAGNOSIS — G479 Sleep disorder, unspecified: Secondary | ICD-10-CM | POA: Diagnosis not present

## 2015-07-09 NOTE — Progress Notes (Signed)
Subjective:    Patient ID: [name] is a 69 y.o. male.  HPI     Interim history:   [name] is a 69 year old right-handed [name] with an underlying medical history of spinal stenosis and back pain, arthritis, diverticulitis, glaucoma, hyperlipidemia, hypertension, and coronary artery disease, status post stent placement, diabetes and overweight state, who presents for follow-up consultation of his sleep disorder, after his recent sleep study. The patient is unaccompanied today. I first met him on 04/25/2015 at the request of his cardiologist, at which time the patient reported a prior diagnosis of obstructive sleep apnea over 20 years ago. [name] was using his CPAP machine. Apparently [name] had a repeat sleep study about 6 or 7 years ago which did not show obstructive sleep apnea but [name] was still using his CPAP because [name] was feeling better with it. I suggested we bring him back for a reevaluation. [name] had a diagnostic sleep study on 06/26/2015 and went over his test results with him in detail today. Sleep efficiency was quite reduced at 49.2% with a latency to sleep of 19.5 minutes and wake after sleep onset of 212.5 minutes with moderate sleep fragmentation noted and several longer periods of wakefulness. [name] had an elevated arousal index secondary to periodic leg movements. [name] achieved only light stage sleep and had absence of slow-wave and REM sleep. 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 Average oxygen saturation was 92%, nadir was 89%. [name] did not achieve supine sleep.   Today, 07/09/2015: [name] reports that [name] had a difficult time with sleep during the sleep study because of his back pain and inability to move easily. At home, [name] sleeps on a beach towel and changes positions by pulling on the towel. [name] denies frank restless leg symptoms but does endorse leg pain and cramps at night. [name] came off of nortriptyline as [name] was not short was helpful with  his back pain and started her trial of gabapentin but [name] realized that nortriptyline was actually helpful with his leg cramps. [name] restarted nortriptyline at a lower dose and stop gabapentin. [name] is currently taking nortriptyline 25 mg each night. [name] is not aware of any leg twitching in his sleep and his wife has not complained, but [name] does note that his beach towel is often bunched up in the mornings.  Previously:  [name] was diagnosed with obstructive sleep apnea over 20 years ago. [name] uses CPAP. [name] reports that [name] had a sleep study most recently about 6 or 7 years ago which did not show significant obstructive sleep apnea. However, [name] still uses CPAP because [name] feels better with it. [name] is compliant with treatment. As [name] recalls, [name] has a pressure of 8 cm. His machine is about 69 years old. [name] uses nasal pillows. [name] wakes up adequately rested for the most part but does have occasional tiredness during the day. [name] also reports some restless leg symptoms and moving his legs at night. [name] used to have quite significant leg cramping at night but things improved after [name] started nortriptyline. [name] also tried gabapentin which did not help. [name] has a history of migraine headaches and rarely has to take Imitrex. [name] has rare morning headaches. [name] has occasional nocturia but not every night. His Epworth sleepiness score is 11 out of 24 today. His fatigue scores 35 out of 63 today. [name] suffers from chronic low back pain. [name] sees Dr. Luan Pulling. [name] is  due for a steroid injection into his lower back. This has helped in the past. [name] has never had back surgery. [name] tries to be in bed between 11 PM and midnight. [name] does not watch TV in bed. His rise time is around 8 AM. His weight has remained fairly stable. [name] drinks about 2 cups of coffee in the morning. [name] is a nonsmoker. [name] had a tonsillectomy as a child and also surgery to his uvula. [name] had a nose injury as a child but had never had it directed. [name] drinks alcohol rarely. [name] suspects  that his father may have had obstructive sleep apnea. His father died of heart disease. There is prevalent heart disease on his father's side of the family.  I reviewed your office note from 01/04/2015 which you kindly included. [name] had an echocardiogram on 12/11/2013 which showed normal left ventricular size, mild concentric hypertrophy, normal global wall motion, normal systolic function with a calculated EF of 58%. [name] had mild mitral regurgitation and mild pulmonary hypertension. [name] had a Myoview stress test on 11/27/2013 which showed no evidence of ischemia. Prior sleep study results are not available for my review.    His Past Medical History Is Significant For: Past Medical History  Diagnosis Date  . Complication of anesthesia   . Coronary artery disease   . Hypertension   . Diabetes mellitus without complication   . Chronic kidney disease     Hx of stones  . Headache(784.0)     migraines  . Cancer     skin ca  . Arthritis   . Spinal stenosis   . Diverticulitis   . Glaucoma   . Ventricular dilatation     Right  . Hyperlipemia   . Atherosclerotic heart disease native coronary artery w/angina pectoris     His Past Surgical History Is Significant For: Past Surgical History  Procedure Laterality Date  . Cardiac catheterization    . Coronary angioplasty    . Lithotripsy    . Colonoscopy N/A 06/15/2013    Procedure: COLONOSCOPY;  Surgeon: Beryle Beams, MD;  Location: [facility];  Service: Endoscopy;  Laterality: N/A;  . Cholecystectomy  1990  . Appendectomy  1991  . Bowel resection  1985    His Family History Is Significant For: Family History  Problem Relation Age of Onset  . Heart disease Father   . Heart attack Father   . Heart disease Paternal Grandmother     His Social History Is Significant For: Social History   Social History  . Marital Status: Married    Spouse Name: N/A  . Number of Children: 0  . Years of Education: Masters +   Occupational History   . Retired    Social History Main Topics  . Smoking status: Never Smoker   . Smokeless tobacco: Never Used  . Alcohol Use: 0.0 oz/week    0 Standard drinks or equivalent per week     Comment: ocas  . Drug Use: No  . Sexual Activity: Not Asked   Other Topics Concern  . None   Social History Narrative   2 cups of coffee a day, soda or tea about 3 times a week.     His Allergies Are:  Allergies  Allergen Reactions  . Amaryl Other (See Comments)    Gives pt migraines  . Skelaxin [Metaxalone] Rash    Cherry red rash with sloughing off of skin at left upper chest/shoulder  . Metformin And Related Diarrhea  .  Levofloxacin Nausea Only  . Lisinopril Other (See Comments)    cough  :   His Current Medications Are:  Outpatient Encounter Prescriptions as of 07/09/2015  Medication Sig  . amLODipine (NORVASC) 10 MG tablet Take 1 tablet (10 mg total) by mouth daily.  Marland Kitchen amLODipine (NORVASC) 10 MG tablet TAKE 1 TABLET DAILY  . aspirin 81 MG tablet Take 81 mg by mouth daily.  Marland Kitchen atorvastatin (LIPITOR) 80 MG tablet TAKE 1 TABLET DAILY  . brimonidine-timolol (COMBIGAN) 0.2-0.5 % ophthalmic solution Place 1 drop into both eyes 2 (two) times daily.  . chlorthalidone (HYGROTON) 25 MG tablet Take 1 tablet (25 mg total) by mouth daily.  . Cholecalciferol (VITAMIN D-3 PO) Take 1 tablet by mouth 2 (two) times a week.   . clotrimazole-betamethasone (LOTRISONE) cream Apply 1 application topically 2 (two) times daily.  . fish oil-omega-3 fatty acids 1000 MG capsule Take 2 g by mouth daily.  Marland Kitchen gemfibrozil (LOPID) 600 MG tablet TAKE 1 TABLET DAILY.  Marland Kitchen glucose blood (FREESTYLE LITE) test strip Test blood sugar daily. Dx code: E11.9  . glucose monitoring kit (FREESTYLE) monitoring kit 1 each by Does not apply route as needed for other. Needs Accucheck Avia meter to check glucose once daily dx 250.01  . Lancets (FREESTYLE) lancets Test blood sugar daily. Dx code: E11.9  . latanoprost (XALATAN) 0.005 %  ophthalmic solution Place 1 drop into both eyes at bedtime.   . metFORMIN (GLUCOPHAGE-XR) 500 MG 24 hr tablet Take 1 tablet (500 mg total) by mouth daily with breakfast.  . multivitamin-lutein (OCUVITE-LUTEIN) CAPS capsule Take 1 capsule by mouth 2 (two) times daily.  . mupirocin ointment (BACTROBAN) 2 % Place 1 application into the nose 2 (two) times daily. (Patient taking differently: Apply 1 application topically 2 (two) times daily as needed (skin infection). )  . nortriptyline (PAMELOR) 25 MG capsule Take 25-50 mg by mouth 2 (two) times daily.   . propranolol ER (INDERAL LA) 60 MG 24 hr capsule Take 1 capsule (60 mg total) by mouth 2 (two) times daily.  . sitaGLIPtin (JANUVIA) 100 MG tablet Take 1 tablet (100 mg total) by mouth daily.  . SUMAtriptan (IMITREX) 50 MG tablet Take 1 tablet (50 mg total) by mouth every 2 (two) hours as needed for migraine.  . [DISCONTINUED] gabapentin (NEURONTIN) 100 MG capsule Take 100 mg by mouth 2 (two) times daily. Takes 2 pills bid  . [DISCONTINUED] zoster vaccine live, PF, (ZOSTAVAX) 16109 UNT/0.65ML injection Inject 19,400 Units into the skin once.   No facility-administered encounter medications on file as of 07/09/2015.  :  Review of Systems:  Out of a complete 14 point review of systems, all are reviewed and negative with the exception of these symptoms as listed below:   Review of Systems  Neurological:       Patient here to discuss sleep study.   All other systems reviewed and are negative.   Objective:  Neurologic Exam  Physical Exam Physical Examination:   Filed Vitals:   07/09/15 1154  BP: 124/68  Pulse: 70  Resp: 18    General Examination: The patient is a very pleasant 69 y.o. male in no acute distress. [name] appears well-developed and well-nourished and well groomed. [name] is good spirits.   HEENT: Normocephalic, atraumatic, pupils are equal, round and reactive to light and accommodation. Extraocular tracking is good without  limitation to gaze excursion or nystagmus noted. Normal smooth pursuit is noted. Hearing is grossly intact. Face is symmetric with  normal facial animation and normal facial sensation. Speech is clear with no dysarthria noted. There is no hypophonia. There is no lip, neck/head, jaw or voice tremor. Neck is supple with full range of passive and active motion. There are no carotid bruits on auscultation. Oropharynx exam reveals: mild to moderate mouth dryness, adequate dental hygiene and moderate airway crowding, due to a wider and asymmetrical appearing uvula and narrow airway entry as well as redundant soft palate. Tonsils are absent. Mallampati is class II. Tongue protrudes centrally and palate elevates symmetrically.    Chest: Clear to auscultation without wheezing, rhonchi or crackles noted.  Heart: S1+S2+0, regular and normal without murmurs, rubs or gallops noted.   Abdomen: Soft, non-tender and non-distended with normal bowel sounds appreciated on auscultation.  Extremities: There is trace pitting edema in the distal lower extremities bilaterally. Pedal pulses are intact.  Skin: Warm and dry without trophic changes noted. There are no varicose veins.  Musculoskeletal: exam reveals no obvious joint deformities, tenderness or joint swelling or erythema.   Neurologically:  Mental status: The patient is awake, alert and oriented in all 4 spheres. His immediate and remote memory, attention, language skills and fund of knowledge are appropriate. There is no evidence of aphasia, agnosia, apraxia or anomia. Speech is clear with normal prosody and enunciation. Thought process is linear. Mood is normal and affect is normal.  Cranial nerves II - XII are as described above under HEENT exam. In addition: shoulder shrug is normal with equal shoulder height noted. Motor exam: Normal bulk, strength and tone is noted. There is no drift, tremor or rebound. Romberg is negative. Reflexes are 2+ throughout. Fine  motor skills and coordination: intact. There is no truncal or gait ataxia.  Sensory exam: intact to light touch in the upper and lower extremities.  Gait, station and balance: [name] stands with mild difficulty. No veering to one side is noted. No leaning to one side is noted. Posture is age-appropriate and stance is narrow based. Gait shows normal stride length and normal pace. No problems turning are noted.   Assessment and plan:   In summary, [name] is a very pleasant 69 year old male with an underlying medical history of spinal stenosis and back pain, arthritis, diverticulitis, glaucoma, hyperlipidemia, hypertension, and coronary artery disease, status post stent placement, diabetes and overweight state, who was diagnosed with obstructive sleep apnea over 20 years ago. Several years ago [name] had another sleep study which failed to demonstrate obstructive sleep apnea. [name] recently had a baseline sleep study on 06/26/2015 which failed to demonstrate any significant obstructive sleep disordered breathing or significant desaturations but unfortunately was quite limited in that [name] achieved a very little sleep with a sleep efficiency of 49% only and [name] only achieved nonsupine sleep and light stage sleep only. These findings may underestimate his sleep disordered breathing and his desaturation nadir. [name] is aware of the test results. Furthermore, [name] had moderate periodic leg movements but does not really endorse restless leg symptoms. [name] has low back discomfort with some radiation to his legs and occasional leg cramps. [name] is on nortriptyline low-dose at night which helped with his leg cramps and [name] tried gabapentin for which she felt was not helpful. His physical exam is stable.  Today, we mutually agreed to continue to monitor his symptoms. At this juncture, [name] does not have reoperation of his previous diagnosis of obstructive sleep apnea. [name] may have REM related obstructive sleep apnea. [name] has a CPAP  machine  which [name] uses when [name] is at home. We will reevaluate things in a year. We can consider in the interim a home sleep test if needed. I answered all his questions today and [name] was in agreement with the plan. [name] is advised to call for sooner appointment if [name] feels [name] has symptoms of restless legs or his leg twitching bothers him at night or if [name] feels that [name] has any new sleep-related symptoms.   I spent 20 minutes in total face-to-face time with the patient, more than 50% of which was spent in counseling and coordination of care, reviewing test results, reviewing medication and discussing or reviewing the diagnosis of PLMD, and OSA, the prognosis and treatment options.

## 2015-07-09 NOTE — Patient Instructions (Signed)
We will monitor your sleep symptoms and re-evaluate in about a year, sooner if needed.

## 2015-07-23 ENCOUNTER — Ambulatory Visit (INDEPENDENT_AMBULATORY_CARE_PROVIDER_SITE_OTHER): Payer: Medicare Other | Admitting: Emergency Medicine

## 2015-07-23 VITALS — BP 140/80 | HR 88 | Temp 98.5°F | Resp 16 | Ht 72.0 in | Wt 210.0 lb

## 2015-07-23 DIAGNOSIS — M79604 Pain in right leg: Secondary | ICD-10-CM

## 2015-07-23 DIAGNOSIS — K047 Periapical abscess without sinus: Secondary | ICD-10-CM

## 2015-07-23 MED ORDER — CEPHALEXIN 500 MG PO CAPS: 500.0000 mg | ORAL_CAPSULE | Freq: Four times a day (QID) | ORAL | Status: DC

## 2015-07-23 MED ORDER — NAPROXEN SODIUM 550 MG PO TABS: 550.0000 mg | ORAL_TABLET | Freq: Two times a day (BID) | ORAL | Status: DC

## 2015-07-23 MED ORDER — NAPROXEN SODIUM 550 MG PO TABS
550.0000 mg | ORAL_TABLET | Freq: Two times a day (BID) | ORAL | Status: DC
Start: 2015-07-23 — End: 2015-07-23

## 2015-07-23 NOTE — Progress Notes (Signed)
Subjective:  Patient ID: [name], male    DOB: 07/21/1946  Age: 69 y.o. MRN: [id number]  CC: Oral Pain and Leg Pain   HPI [name] presents  for treatment of a dental infection. He said he was treated recently with penicillin for a dental abscess and didn't tolerate the medication due to adverse side effects diarrhea he said today he expressed a significant amount of pus from the gingiva surrounding tooth he has noted pain in the tooth itself. Has no fever chills. No history of injury. He also complains of pain in his right knee laterally it's exquisitely tender but not associated with any swelling or limitation of activity or motion. He said that suddenly started last week.  History Vane has a past medical history of Complication of anesthesia; Coronary artery disease; Hypertension; Diabetes mellitus without complication; Chronic kidney disease; Headache(784.0); Cancer; Arthritis; Spinal stenosis; Diverticulitis; Glaucoma; Ventricular dilatation; Hyperlipemia; and Atherosclerotic heart disease native coronary artery w/angina pectoris.   He has past surgical history that includes Cardiac catheterization; Coronary angioplasty; Lithotripsy; Colonoscopy (N/A, 06/15/2013); Cholecystectomy (1990); Appendectomy (1991); and Bowel resection (1985).   His  family history includes Heart attack in his father; Heart disease in his father and paternal grandmother.  He   reports that he has never smoked. He has never used smokeless tobacco. He reports that he drinks alcohol. He reports that he does not use illicit drugs.  Outpatient Prescriptions Prior to Visit  Medication Sig Dispense Refill  . amLODipine (NORVASC) 10 MG tablet Take 1 tablet (10 mg total) by mouth daily. 90 tablet 3  . amLODipine (NORVASC) 10 MG tablet TAKE 1 TABLET DAILY 90 tablet 0  . aspirin 81 MG tablet Take 81 mg by mouth daily.    Marland Kitchen atorvastatin (LIPITOR) 80 MG tablet TAKE 1 TABLET DAILY 90 tablet 2  .  brimonidine-timolol (COMBIGAN) 0.2-0.5 % ophthalmic solution Place 1 drop into both eyes 2 (two) times daily.    . chlorthalidone (HYGROTON) 25 MG tablet Take 1 tablet (25 mg total) by mouth daily. 100 tablet 3  . Cholecalciferol (VITAMIN D-3 PO) Take 1 tablet by mouth 2 (two) times a week.     . clotrimazole-betamethasone (LOTRISONE) cream Apply 1 application topically 2 (two) times daily. 30 g 2  . fish oil-omega-3 fatty acids 1000 MG capsule Take 2 g by mouth daily.    Marland Kitchen gemfibrozil (LOPID) 600 MG tablet TAKE 1 TABLET DAILY. 90 tablet 2  . glucose blood (FREESTYLE LITE) test strip Test blood sugar daily. Dx code: E11.9 100 each 2  . glucose monitoring kit (FREESTYLE) monitoring kit 1 each by Does not apply route as needed for other. Needs Accucheck Avia meter to check glucose once daily dx 250.01 1 each 0  . Lancets (FREESTYLE) lancets Test blood sugar daily. Dx code: E11.9 100 each 2  . latanoprost (XALATAN) 0.005 % ophthalmic solution Place 1 drop into both eyes at bedtime.     . metFORMIN (GLUCOPHAGE-XR) 500 MG 24 hr tablet Take 1 tablet (500 mg total) by mouth daily with breakfast. 90 tablet 3  . multivitamin-lutein (OCUVITE-LUTEIN) CAPS capsule Take 1 capsule by mouth 2 (two) times daily.    . mupirocin ointment (BACTROBAN) 2 % Place 1 application into the nose 2 (two) times daily. (Patient taking differently: Apply 1 application topically 2 (two) times daily as needed (skin infection). ) 22 g 6  . propranolol ER (INDERAL LA) 60 MG 24 hr capsule Take 1 capsule (60 mg  total) by mouth 2 (two) times daily. 180 capsule 3  . sitaGLIPtin (JANUVIA) 100 MG tablet Take 1 tablet (100 mg total) by mouth daily. 90 tablet 3  . SUMAtriptan (IMITREX) 50 MG tablet Take 1 tablet (50 mg total) by mouth every 2 (two) hours as needed for migraine. 24 tablet 2  . nortriptyline (PAMELOR) 25 MG capsule Take 25-50 mg by mouth 2 (two) times daily.   1   No facility-administered medications prior to visit.     Social History   Social History  . Marital Status: Married    Spouse Name: N/A  . Number of Children: 0  . Years of Education: Masters +   Occupational History  . Retired    Social History Main Topics  . Smoking status: Never Smoker   . Smokeless tobacco: Never Used  . Alcohol Use: 0.0 oz/week    0 Standard drinks or equivalent per week     Comment: ocas  . Drug Use: No  . Sexual Activity: Not Asked   Other Topics Concern  . None   Social History Narrative   2 cups of coffee a day, soda or tea about 3 times a week.      Review of Systems  Constitutional: Negative for fever, chills and appetite change.  HENT: Negative for congestion, ear pain, postnasal drip, sinus pressure and sore throat.   Eyes: Negative for pain and redness.  Respiratory: Negative for cough, shortness of breath and wheezing.   Cardiovascular: Negative for leg swelling.  Gastrointestinal: Negative for nausea, vomiting, abdominal pain, diarrhea, constipation and blood in stool.  Endocrine: Negative for polyuria.  Genitourinary: Negative for dysuria, urgency, frequency and flank pain.  Musculoskeletal: Negative for gait problem.  Skin: Negative for rash.  Neurological: Negative for weakness and headaches.  Psychiatric/Behavioral: Negative for confusion and decreased concentration. The patient is not nervous/anxious.     Objective:  BP 140/80 mmHg  Pulse 88  Temp(Src) 98.5 F (36.9 C) (Oral)  Resp 16  Ht 6' (1.829 m)  Wt 210 lb (95.255 kg)  BMI 28.47 kg/m2  SpO2 97%  Physical Exam  Constitutional: He is oriented to person, place, and time. He appears well-developed and well-nourished. No distress.  HENT:  Head: Normocephalic and atraumatic.  Right Ear: External ear normal.  Left Ear: External ear normal.  Nose: Nose normal.  Eyes: Conjunctivae and EOM are normal. Pupils are equal, round, and reactive to light. No scleral icterus.  Neck: Normal range of motion. Neck supple. No  tracheal deviation present.  Cardiovascular: Normal rate, regular rhythm and normal heart sounds.   Pulmonary/Chest: Effort normal. No respiratory distress. He has no wheezes. He has no rales.  Abdominal: He exhibits no mass. There is no tenderness. There is no rebound and no guarding.  Musculoskeletal: He exhibits no edema.  Lymphadenopathy:    He has no cervical adenopathy.  Neurological: He is alert and oriented to person, place, and time. Coordination normal.  Skin: Skin is warm and dry. No rash noted.  Psychiatric: He has a normal mood and affect. His behavior is normal. Thought content normal.      Assessment & Plan:   Rion was seen today for oral pain and leg pain.  Diagnoses and all orders for this visit:  Dental abscess  Other orders -     Discontinue: cephALEXin (KEFLEX) 500 MG capsule; Take 1 capsule (500 mg total) by mouth 4 (four) times daily. -     Discontinue: naproxen sodium (ANAPROX  DS) 550 MG tablet; Take 1 tablet (550 mg total) by mouth 2 (two) times daily with a meal. -     naproxen sodium (ANAPROX DS) 550 MG tablet; Take 1 tablet (550 mg total) by mouth 2 (two) times daily with a meal. -     cephALEXin (KEFLEX) 500 MG capsule; Take 1 capsule (500 mg total) by mouth 4 (four) times daily.   I have discontinued [name]'s nortriptyline. I am also having him maintain his aspirin, latanoprost, Cholecalciferol (VITAMIN D-3 PO), fish oil-omega-3 fatty acids, SUMAtriptan, glucose monitoring kit, clotrimazole-betamethasone, brimonidine-timolol, mupirocin ointment, multivitamin-lutein, freestyle, glucose blood, sitaGLIPtin, propranolol ER, amLODipine, metFORMIN, amLODipine, atorvastatin, gemfibrozil, chlorthalidone, naproxen sodium, and cephALEXin.  Meds ordered this encounter  Medications  . DISCONTD: cephALEXin (KEFLEX) 500 MG capsule    Sig: Take 1 capsule (500 mg total) by mouth 4 (four) times daily.    Dispense:  40 capsule    Refill:  0  . DISCONTD:  naproxen sodium (ANAPROX DS) 550 MG tablet    Sig: Take 1 tablet (550 mg total) by mouth 2 (two) times daily with a meal.    Dispense:  40 tablet    Refill:  0  . naproxen sodium (ANAPROX DS) 550 MG tablet    Sig: Take 1 tablet (550 mg total) by mouth 2 (two) times daily with a meal.    Dispense:  40 tablet    Refill:  0  . cephALEXin (KEFLEX) 500 MG capsule    Sig: Take 1 capsule (500 mg total) by mouth 4 (four) times daily.    Dispense:  40 capsule    Refill:  0    Appropriate red flag conditions were discussed with the patient as well as actions that should be taken.  Patient expressed his understanding.  Follow-up: Return if symptoms worsen or fail to improve.  Roselee Culver, MD

## 2015-07-23 NOTE — Patient Instructions (Signed)

## 2015-07-25 ENCOUNTER — Ambulatory Visit (INDEPENDENT_AMBULATORY_CARE_PROVIDER_SITE_OTHER): Payer: Medicare Other | Admitting: Family Medicine

## 2015-07-25 VITALS — BP 132/72 | HR 73 | Temp 98.6°F | Resp 18 | Ht 73.0 in | Wt 211.0 lb

## 2015-07-25 DIAGNOSIS — M6580 Other synovitis and tenosynovitis, unspecified site: Secondary | ICD-10-CM | POA: Diagnosis not present

## 2015-07-25 DIAGNOSIS — K047 Periapical abscess without sinus: Secondary | ICD-10-CM | POA: Diagnosis not present

## 2015-07-25 DIAGNOSIS — G479 Sleep disorder, unspecified: Secondary | ICD-10-CM | POA: Diagnosis not present

## 2015-07-25 DIAGNOSIS — Z23 Encounter for immunization: Secondary | ICD-10-CM

## 2015-07-25 DIAGNOSIS — M76891 Other specified enthesopathies of right lower limb, excluding foot: Secondary | ICD-10-CM

## 2015-07-25 DIAGNOSIS — E119 Type 2 diabetes mellitus without complications: Secondary | ICD-10-CM | POA: Diagnosis not present

## 2015-07-25 LAB — POCT CBC
Granulocyte percent: 61.6 %G (ref 37–80)
HCT, POC: 44.2 % (ref 43.5–53.7)
Hemoglobin: 14.6 g/dL (ref 14.1–18.1)
Lymph, poc: 1.7 (ref 0.6–3.4)
MCH, POC: 30.5 pg (ref 27–31.2)
MCHC: 33 g/dL (ref 31.8–35.4)
MCV: 92.5 fL (ref 80–97)
MID (cbc): 0.6 (ref 0–0.9)
MPV: 7.1 fL (ref 0–99.8)
POC Granulocyte: 3.6 (ref 2–6.9)
POC LYMPH PERCENT: 29 %L (ref 10–50)
POC MID %: 9.4 %M (ref 0–12)
Platelet Count, POC: 241 10*3/uL (ref 142–424)
RBC: 4.78 M/uL (ref 4.69–6.13)
RDW, POC: 13.5 %
WBC: 5.9 10*3/uL (ref 4.6–10.2)

## 2015-07-25 LAB — COMPLETE METABOLIC PANEL WITH GFR
ALT: 27 U/L (ref 9–46)
AST: 19 U/L (ref 10–35)
Albumin: 4.4 g/dL (ref 3.6–5.1)
Alkaline Phosphatase: 67 U/L (ref 40–115)
BUN: 19 mg/dL (ref 7–25)
CO2: 30 mmol/L (ref 20–31)
Calcium: 10.8 mg/dL — ABNORMAL HIGH (ref 8.6–10.3)
Chloride: 102 mmol/L (ref 98–110)
Creat: 0.87 mg/dL (ref 0.70–1.25)
GFR, Est African American: >89 mL/min (ref >=60)
GFR, Est Non African American: 88 mL/min (ref >=60)
Glucose, Bld: 141 mg/dL — ABNORMAL HIGH (ref 65–99)
Potassium: 3.7 mmol/L (ref 3.5–5.3)
Sodium: 143 mmol/L (ref 135–146)
Total Bilirubin: 0.6 mg/dL (ref 0.2–1.2)
Total Protein: 6.6 g/dL (ref 6.1–8.1)

## 2015-07-25 LAB — LIPID PANEL
Cholesterol: 113 mg/dL — ABNORMAL LOW (ref 125–200)
HDL: 30 mg/dL — ABNORMAL LOW (ref >=40)
LDL Cholesterol: 57 mg/dL (ref <130)
Total CHOL/HDL Ratio: 3.8 Ratio (ref <=5.0)
Triglycerides: 132 mg/dL (ref <150)
VLDL: 26 mg/dL (ref <30)

## 2015-07-25 LAB — POCT GLYCOSYLATED HEMOGLOBIN (HGB A1C): Hemoglobin A1C: 5.9

## 2015-07-25 MED ORDER — DOXYCYCLINE HYCLATE 100 MG PO CAPS: 100.0000 mg | ORAL_CAPSULE | Freq: Two times a day (BID) | ORAL | Status: DC

## 2015-07-25 MED ORDER — DICLOFENAC SODIUM 1 % TD GEL: 2.0000 g | Freq: Four times a day (QID) | TRANSDERMAL | Status: DC

## 2015-07-25 NOTE — Progress Notes (Signed)
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 He was seen couple days ago and prescribed Keflex for his dental infection. He was worried that he would develop diarrhea because the Keflex is in the penicillin family and he had diarrhea from penicillin 3 weeks ago when he saw his dentist. He normally wears a night retainer but he has stopped this because of the irritation on his gums. The dental problem is in tooth #25. In the past she's done well with doxycycline.  Patient's also doing well regarding sleep. He bought a new mattress and this seems of corrected his sleep apnea type problem. His sleep study was negative.  Patient's also had 2 weeks of right knee pain on the lateral joint line. He notes that when he lies down at night and the right lateral knee hits the mattress, yes discomfort. He's able to walk without problem however.  Patient was given Naprosyn for his knee but since she's had heart disease he's vertically concerned about what the Naprosyn may due to his stomach or his kidneys.  Objective: No acute distress BP 132/72 mmHg  Pulse 73  Temp(Src) 98.6 F (37 C) (Oral)  Resp 18  Ht 6\' 1"  (1.854 m)  Wt 211 lb (95.709 kg)  BMI 27.84 kg/m2  SpO2 97% Examination the mouth reveals slightly loose tooth #25. He has some tenderness without swelling or redness over the lateral right knee joint Patient does have some slight swelling in his right cheek and some tenderness in the right jaw.  Results for orders placed or performed in visit on 07/25/15  POCT glycosylated hemoglobin (Hb A1C)  Result Value Ref Range   Hemoglobin A1C 5.9   POCT CBC  Result Value Ref Range   WBC 5.9 4.6 - 10.2 K/uL   Lymph, poc 1.7 0.6 - 3.4   POC LYMPH PERCENT 29.0 10 - 50 %L   MID (cbc) 0.6 0 - 0.9   POC MID % 9.4 0 - 12 %M   POC Granulocyte 3.6 2 - 6.9   Granulocyte percent 61.6 37 - 80 %G   RBC 4.78 4.69 - 6.13 M/uL   Hemoglobin 14.6 14.1 - 18.1 g/dL    HCT, POC 44.2 43.5 - 53.7 %   MCV 92.5 80 - 97 fL   MCH, POC 30.5 27 - 31.2 pg   MCHC 33.0 31.8 - 35.4 g/dL   RDW, POC 13.5 %   Platelet Count, POC 241 142 - 424 K/uL   MPV 7.1 0 - 99.8 fL   This chart was scribed in my presence and reviewed by me personally.    ICD-9-CM ICD-10-CM   1. Dental infection 522.4 K04.7 POCT CBC     doxycycline (VIBRAMYCIN) 100 MG capsule  2. Tendinitis of right knee 727.09 M65.80 diclofenac sodium (VOLTAREN) 1 % GEL  3. Sleep disturbance 780.50 G47.9   4. Type 2 diabetes mellitus, controlled 250.00 E11.9 POCT glycosylated hemoglobin (Hb A1C)     Lipid panel     COMPLETE METABOLIC PANEL WITH GFR     Signed, Robyn Haber, MD

## 2015-07-27 ENCOUNTER — Encounter: Payer: Self-pay | Admitting: Family Medicine

## 2015-07-29 DIAGNOSIS — M545 Low back pain: Secondary | ICD-10-CM | POA: Diagnosis not present

## 2015-07-29 DIAGNOSIS — M5137 Other intervertebral disc degeneration, lumbosacral region: Secondary | ICD-10-CM | POA: Diagnosis not present

## 2015-08-13 DIAGNOSIS — I714 Abdominal aortic aneurysm, without rupture: Secondary | ICD-10-CM | POA: Diagnosis not present

## 2015-08-27 DIAGNOSIS — M4806 Spinal stenosis, lumbar region: Secondary | ICD-10-CM | POA: Diagnosis not present

## 2015-08-27 DIAGNOSIS — M546 Pain in thoracic spine: Secondary | ICD-10-CM | POA: Diagnosis not present

## 2015-08-27 DIAGNOSIS — M5136 Other intervertebral disc degeneration, lumbar region: Secondary | ICD-10-CM | POA: Diagnosis not present

## 2015-08-27 DIAGNOSIS — Z6828 Body mass index (BMI) 28.0-28.9, adult: Secondary | ICD-10-CM | POA: Diagnosis not present

## 2015-08-27 DIAGNOSIS — M5412 Radiculopathy, cervical region: Secondary | ICD-10-CM | POA: Diagnosis not present

## 2015-08-27 DIAGNOSIS — M5137 Other intervertebral disc degeneration, lumbosacral region: Secondary | ICD-10-CM | POA: Diagnosis not present

## 2015-08-27 DIAGNOSIS — M47812 Spondylosis without myelopathy or radiculopathy, cervical region: Secondary | ICD-10-CM | POA: Diagnosis not present

## 2015-08-27 DIAGNOSIS — M545 Low back pain: Secondary | ICD-10-CM | POA: Diagnosis not present

## 2015-09-04 DIAGNOSIS — I517 Cardiomegaly: Secondary | ICD-10-CM | POA: Diagnosis not present

## 2015-09-04 DIAGNOSIS — Z136 Encounter for screening for cardiovascular disorders: Secondary | ICD-10-CM | POA: Diagnosis not present

## 2015-09-04 DIAGNOSIS — I2510 Atherosclerotic heart disease of native coronary artery without angina pectoris: Secondary | ICD-10-CM | POA: Diagnosis not present

## 2015-09-04 DIAGNOSIS — I10 Essential (primary) hypertension: Secondary | ICD-10-CM | POA: Diagnosis not present

## 2015-09-11 DIAGNOSIS — H401123 Primary open-angle glaucoma, left eye, severe stage: Secondary | ICD-10-CM | POA: Diagnosis not present

## 2015-09-11 DIAGNOSIS — H401112 Primary open-angle glaucoma, right eye, moderate stage: Secondary | ICD-10-CM | POA: Diagnosis not present

## 2015-09-11 DIAGNOSIS — H2513 Age-related nuclear cataract, bilateral: Secondary | ICD-10-CM | POA: Diagnosis not present

## 2015-09-11 DIAGNOSIS — E119 Type 2 diabetes mellitus without complications: Secondary | ICD-10-CM | POA: Diagnosis not present

## 2015-09-25 ENCOUNTER — Other Ambulatory Visit: Payer: Self-pay | Admitting: Family Medicine

## 2015-09-27 DIAGNOSIS — H401133 Primary open-angle glaucoma, bilateral, severe stage: Secondary | ICD-10-CM | POA: Diagnosis not present

## 2015-10-01 DIAGNOSIS — S63657D Sprain of metacarpophalangeal joint of left little finger, subsequent encounter: Secondary | ICD-10-CM | POA: Diagnosis not present

## 2015-10-15 DIAGNOSIS — S63657D Sprain of metacarpophalangeal joint of left little finger, subsequent encounter: Secondary | ICD-10-CM | POA: Diagnosis not present

## 2015-10-20 ENCOUNTER — Encounter (HOSPITAL_COMMUNITY): Payer: Self-pay | Admitting: Emergency Medicine

## 2015-10-20 ENCOUNTER — Emergency Department (HOSPITAL_COMMUNITY)
Admission: EM | Admit: 2015-10-20 | Discharge: 2015-10-20 | Disposition: A | Discharge status: Home / Self Care | Payer: Medicare Other | Attending: Emergency Medicine | Admitting: Emergency Medicine

## 2015-10-20 DIAGNOSIS — R040 Epistaxis: Secondary | ICD-10-CM

## 2015-10-20 DIAGNOSIS — Z79899 Other long term (current) drug therapy: Secondary | ICD-10-CM | POA: Diagnosis not present

## 2015-10-20 DIAGNOSIS — H409 Unspecified glaucoma: Secondary | ICD-10-CM | POA: Diagnosis not present

## 2015-10-20 DIAGNOSIS — Z87442 Personal history of urinary calculi: Secondary | ICD-10-CM | POA: Insufficient documentation

## 2015-10-20 DIAGNOSIS — I129 Hypertensive chronic kidney disease with stage 1 through stage 4 chronic kidney disease, or unspecified chronic kidney disease: Secondary | ICD-10-CM | POA: Diagnosis not present

## 2015-10-20 DIAGNOSIS — N189 Chronic kidney disease, unspecified: Secondary | ICD-10-CM | POA: Diagnosis not present

## 2015-10-20 DIAGNOSIS — M1990 Unspecified osteoarthritis, unspecified site: Secondary | ICD-10-CM | POA: Insufficient documentation

## 2015-10-20 DIAGNOSIS — E785 Hyperlipidemia, unspecified: Secondary | ICD-10-CM | POA: Insufficient documentation

## 2015-10-20 DIAGNOSIS — Z85828 Personal history of other malignant neoplasm of skin: Secondary | ICD-10-CM | POA: Diagnosis not present

## 2015-10-20 DIAGNOSIS — G43909 Migraine, unspecified, not intractable, without status migrainosus: Secondary | ICD-10-CM | POA: Insufficient documentation

## 2015-10-20 DIAGNOSIS — E119 Type 2 diabetes mellitus without complications: Secondary | ICD-10-CM | POA: Diagnosis not present

## 2015-10-20 DIAGNOSIS — Z7982 Long term (current) use of aspirin: Secondary | ICD-10-CM | POA: Insufficient documentation

## 2015-10-20 DIAGNOSIS — Z792 Long term (current) use of antibiotics: Secondary | ICD-10-CM | POA: Diagnosis not present

## 2015-10-20 DIAGNOSIS — Z9889 Other specified postprocedural states: Secondary | ICD-10-CM | POA: Insufficient documentation

## 2015-10-20 DIAGNOSIS — Z9861 Coronary angioplasty status: Secondary | ICD-10-CM | POA: Diagnosis not present

## 2015-10-20 DIAGNOSIS — Z791 Long term (current) use of non-steroidal anti-inflammatories (NSAID): Secondary | ICD-10-CM | POA: Diagnosis not present

## 2015-10-20 DIAGNOSIS — J3489 Other specified disorders of nose and nasal sinuses: Secondary | ICD-10-CM | POA: Diagnosis not present

## 2015-10-20 DIAGNOSIS — R58 Hemorrhage, not elsewhere classified: Secondary | ICD-10-CM | POA: Diagnosis not present

## 2015-10-20 DIAGNOSIS — I25119 Atherosclerotic heart disease of native coronary artery with unspecified angina pectoris: Secondary | ICD-10-CM | POA: Diagnosis not present

## 2015-10-20 MED ORDER — SILVER NITRATE-POT NITRATE 75-25 % EX MISC
1.0000 "application " | Freq: Once | CUTANEOUS | Status: AC
  Administered 2015-10-20: 1 via TOPICAL
  Filled 2015-10-20: qty 1

## 2015-10-20 MED ORDER — LIDOCAINE HCL 2 % EX GEL
1.0000 "application " | Freq: Once | CUTANEOUS | Status: AC
  Administered 2015-10-20: 1
  Filled 2015-10-20: qty 11

## 2015-10-20 NOTE — ED Provider Notes (Signed)
CSN: [id number]     Arrival date & time 10/20/15  0251 History  By signing my name below, I, Stephania Fragmin, attest that this documentation has been prepared under the direction and in the presence of Orpah Greek, MD. Electronically Signed: Stephania Fragmin, ED Scribe. 10/20/2015. 3:55 AM.   Chief Complaint  Patient presents with  . Epistaxis   The history is provided by the patient. No language interpreter was used.    HPI Comments: [name] is a 69 y.o. male who presents to the Emergency Department brought in by ambulance, complaining of epistaxis that began at midnight, about 4 hours ago, that was able to be controlled with pressure until it restarted again 1 hour ago, at 2:30 AM. He states his family had called EMS but they were still not able to control the bleeding. He reports he was initially bleeding from the right sided before his bleeding became bilateral. Patient states he had a similar problem around the same time of year last year. He saw an ENT specialist at that time, who resolved his symptoms by cauterizing the area of bleeding. He states that the bleeding is not currently running down the back of his throat.   Past Medical History  Diagnosis Date  . Complication of anesthesia   . Coronary artery disease   . Hypertension   . Diabetes mellitus without complication   . Chronic kidney disease     Hx of stones  . Headache(784.0)     migraines  . Cancer     skin ca  . Arthritis   . Spinal stenosis   . Diverticulitis   . Glaucoma   . Ventricular dilatation     Right  . Hyperlipemia   . 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 Cardiac catheterization    . Coronary angioplasty    . Lithotripsy    . Colonoscopy N/A 06/15/2013    Procedure: COLONOSCOPY;  Surgeon: Beryle Beams, MD;  Location: [facility];  Service: Endoscopy;  Laterality: N/A;  . Cholecystectomy  1990  .  Appendectomy  1991  . Bowel resection  1985   Family History  Problem Relation Age of Onset  . Heart disease Father   . Heart attack Father   . Heart disease Paternal Grandmother    Social History  Substance Use Topics  . Smoking status: Never Smoker   . Smokeless tobacco: Never Used  . Alcohol Use: 0.0 oz/week    0 Standard drinks or equivalent per week     Comment: ocas    Review of Systems  HENT: Positive for nosebleeds.   All other systems reviewed and are negative.  Allergies  Amaryl; Skelaxin; Metformin and related; Levofloxacin; and Lisinopril  Home Medications   Prior to Admission medications   Medication Sig Start Date End Date Taking? Authorizing Provider  amLODipine (NORVASC) 10 MG tablet Take 1 tablet daily   "office visit needed for refills" 09/25/15   Robyn Haber, MD  aspirin 81 MG tablet Take 81 mg by mouth daily.    Historical Provider, MD  atorvastatin (LIPITOR) 80 MG tablet TAKE 1 TABLET DAILY 06/18/15   Robyn Haber, MD  brimonidine-timolol (COMBIGAN) 0.2-0.5 % ophthalmic solution Place 1 drop into both eyes 2 (two) times daily.    Historical Provider, MD  chlorthalidone (HYGROTON) 25 MG tablet Take 1 tablet (25 mg total) by mouth daily. 06/21/15   Robyn Haber,  MD  Cholecalciferol (VITAMIN D-3 PO) Take 1 tablet by mouth 2 (two) times a week.     Historical Provider, MD  clotrimazole-betamethasone (LOTRISONE) cream Apply 1 application topically 2 (two) times daily. 10/31/13   Robyn Haber, MD  diclofenac sodium (VOLTAREN) 1 % GEL Apply 2 g topically 4 (four) times daily. 07/25/15   Robyn Haber, MD  doxycycline (VIBRAMYCIN) 100 MG capsule Take 1 capsule (100 mg total) by mouth 2 (two) times daily. 07/25/15   Robyn Haber, MD  fish oil-omega-3 fatty acids 1000 MG capsule Take 2 g by mouth daily.    Historical Provider, MD  gemfibrozil (LOPID) 600 MG tablet TAKE 1 TABLET DAILY. 06/18/15   Robyn Haber, MD  glucose blood (FREESTYLE LITE) test strip  Test blood sugar daily. Dx code: E11.9 02/15/15   Robyn Haber, MD  glucose monitoring kit (FREESTYLE) monitoring kit 1 each by Does not apply route as needed for other. Needs Accucheck Avia meter to check glucose once daily dx 250.01 12/27/12   Areta Haber Dunn, PA-C  Lancets (FREESTYLE) lancets Test blood sugar daily. Dx code: E11.9 02/15/15   Robyn Haber, MD  latanoprost (XALATAN) 0.005 % ophthalmic solution Place 1 drop into both eyes at bedtime.     Historical Provider, MD  metFORMIN (GLUCOPHAGE-XR) 500 MG 24 hr tablet Take 1 tablet (500 mg total) by mouth daily with breakfast. 04/16/15   Robyn Haber, MD  multivitamin-lutein [facility]) CAPS capsule Take 1 capsule by mouth 2 (two) times daily.    Historical Provider, MD  mupirocin ointment (BACTROBAN) 2 % Place 1 application into the nose 2 (two) times daily. Patient taking differently: Apply 1 application topically 2 (two) times daily as needed (skin infection).  11/22/14   Robyn Haber, MD  propranolol ER (INDERAL LA) 60 MG 24 hr capsule Take 1 capsule (60 mg total) by mouth 2 (two) times daily. 02/28/15   Robyn Haber, MD  sitaGLIPtin (JANUVIA) 100 MG tablet Take 1 tablet (100 mg total) by mouth daily. 02/28/15   Robyn Haber, MD  SUMAtriptan (IMITREX) 50 MG tablet Take 1 tablet (50 mg total) by mouth every 2 (two) hours as needed for migraine. 09/01/12 12/05/14  Robyn Haber, MD   There were no vitals taken for this visit. Physical Exam  Constitutional: He is oriented to person, place, and time. He appears well-developed and well-nourished. No distress.  HENT:  Head: Normocephalic and atraumatic.  Right Ear: Hearing normal.  Left Ear: Hearing normal.  Nose: Mucosal edema present. Epistaxis (Dried blood and clots right side of nose) is observed.  Mouth/Throat: Oropharynx is clear and moist and mucous membranes are normal.  Eyes: Conjunctivae and EOM are normal. Pupils are equal, round, and reactive to light.  Neck: Normal  range of motion. Neck supple.  Cardiovascular: Regular rhythm, S1 normal and S2 normal.  Exam reveals no gallop and no friction rub.   No murmur heard. Pulmonary/Chest: Effort normal and breath sounds normal. No respiratory distress. He exhibits no tenderness.  Abdominal: Soft. Normal appearance and bowel sounds are normal. There is no hepatosplenomegaly. There is no tenderness. There is no rebound, no guarding, no tenderness at McBurney's point and negative Murphy's sign. No hernia.  Musculoskeletal: Normal range of motion.  Neurological: He is alert and oriented to person, place, and time. He has normal strength. No cranial nerve deficit or sensory deficit. Coordination normal. GCS eye subscore is 4. GCS verbal subscore is 5. GCS motor subscore is 6.  Skin: Skin is warm, dry  and intact. No rash noted. No cyanosis.  Psychiatric: He has a normal mood and affect. His speech is normal and behavior is normal. Thought content normal.  Nursing note and vitals reviewed.   ED Course  Procedures (including critical care time)  DIAGNOSTIC STUDIES: Oxygen Saturation is 95% on RA, normal by my interpretation.    COORDINATION OF CARE: 3:29 AM - Discussed treatment plan with pt at bedside. Pt verbalized understanding and agreed to plan.  Labs Review Labs Reviewed - No data to display  Imaging Review No results found. I have personally reviewed and evaluated these images and lab results as part of my medical decision-making.   EKG Interpretation None      MDM   Final diagnoses:  None   epistaxis   Presents to the ER for evaluation of nosebleed. Patient had a nosebleed earlier tonight but stopped with direct pressure, then started rebleeding and was unable to get the nose to stop. Pain has been primarily from the right side, but has backed up and come around to the left side as well. Initial examination revealed blood and clots in the right side of his nose. These are cleared there was a  small area where the bleeding was coming from and this area was topically anesthetized and then cauterize with silver nitrate. Follow-up with his ENT doctor.  I personally performed the services described in this documentation, which was scribed in my presence. The recorded information has been reviewed and is accurate.      Orpah Greek, MD 10/20/15 [phone]

## 2015-10-20 NOTE — ED Notes (Signed)
Per EMS pt had a nosebleed earlier tonight around midnight that they were able to control at that time  Pt states it restarted around 230 am  EMS was called back out and were not able to get it to stop  Pt states he has a hx of same about this time last year  Bleeding has slowed but not stopped  Pt denies pain

## 2015-10-20 NOTE — ED Notes (Signed)
MD at bedside. 

## 2015-10-20 NOTE — Discharge Instructions (Signed)

## 2015-10-24 DIAGNOSIS — M4806 Spinal stenosis, lumbar region: Secondary | ICD-10-CM | POA: Diagnosis not present

## 2015-10-24 DIAGNOSIS — M5412 Radiculopathy, cervical region: Secondary | ICD-10-CM | POA: Diagnosis not present

## 2015-10-25 DIAGNOSIS — S63657D Sprain of metacarpophalangeal joint of left little finger, subsequent encounter: Secondary | ICD-10-CM | POA: Diagnosis not present

## 2015-10-28 DIAGNOSIS — J342 Deviated nasal septum: Secondary | ICD-10-CM | POA: Diagnosis not present

## 2015-10-28 DIAGNOSIS — R040 Epistaxis: Secondary | ICD-10-CM | POA: Diagnosis not present

## 2015-11-03 ENCOUNTER — Encounter (HOSPITAL_COMMUNITY): Payer: Self-pay | Admitting: *Deleted

## 2015-11-03 ENCOUNTER — Emergency Department (HOSPITAL_COMMUNITY)
Admission: EM | Admit: 2015-11-03 | Discharge: 2015-11-03 | Disposition: A | Discharge status: Home / Self Care | Payer: Medicare Other | Attending: Emergency Medicine | Admitting: Emergency Medicine

## 2015-11-03 DIAGNOSIS — I129 Hypertensive chronic kidney disease with stage 1 through stage 4 chronic kidney disease, or unspecified chronic kidney disease: Secondary | ICD-10-CM | POA: Diagnosis not present

## 2015-11-03 DIAGNOSIS — R040 Epistaxis: Secondary | ICD-10-CM | POA: Diagnosis not present

## 2015-11-03 DIAGNOSIS — M1990 Unspecified osteoarthritis, unspecified site: Secondary | ICD-10-CM | POA: Insufficient documentation

## 2015-11-03 DIAGNOSIS — E785 Hyperlipidemia, unspecified: Secondary | ICD-10-CM | POA: Diagnosis not present

## 2015-11-03 DIAGNOSIS — N189 Chronic kidney disease, unspecified: Secondary | ICD-10-CM | POA: Diagnosis not present

## 2015-11-03 DIAGNOSIS — Z79899 Other long term (current) drug therapy: Secondary | ICD-10-CM | POA: Diagnosis not present

## 2015-11-03 DIAGNOSIS — I2510 Atherosclerotic heart disease of native coronary artery without angina pectoris: Secondary | ICD-10-CM | POA: Diagnosis not present

## 2015-11-03 DIAGNOSIS — Z85828 Personal history of other malignant neoplasm of skin: Secondary | ICD-10-CM | POA: Diagnosis not present

## 2015-11-03 DIAGNOSIS — Z7982 Long term (current) use of aspirin: Secondary | ICD-10-CM | POA: Insufficient documentation

## 2015-11-03 DIAGNOSIS — R6889 Other general symptoms and signs: Secondary | ICD-10-CM | POA: Diagnosis not present

## 2015-11-03 MED ORDER — AMOXICILLIN 500 MG PO CAPS: 500.0000 mg | ORAL_CAPSULE | Freq: Three times a day (TID) | ORAL | Status: DC

## 2015-11-03 MED ORDER — HYDROCODONE-ACETAMINOPHEN 5-325 MG PO TABS
1.0000 | ORAL_TABLET | Freq: Once | ORAL | Status: AC
  Administered 2015-11-03: 1 via ORAL
  Filled 2015-11-03: qty 1

## 2015-11-03 MED ORDER — HYDROCODONE-ACETAMINOPHEN 5-325 MG PO TABS: 1.0000 | ORAL_TABLET | ORAL | Status: DC | PRN

## 2015-11-03 MED ORDER — ONDANSETRON HCL 4 MG/2ML IJ SOLN
4.0000 mg | Freq: Once | INTRAMUSCULAR | Status: DC
  Filled 2015-11-03: qty 2

## 2015-11-03 MED ORDER — ONDANSETRON 4 MG PO TBDP
4.0000 mg | ORAL_TABLET | Freq: Once | ORAL | Status: AC
  Administered 2015-11-03: 4 mg via ORAL
  Filled 2015-11-03: qty 1

## 2015-11-03 MED ORDER — ONDANSETRON 4 MG PO TBDP: 4.0000 mg | ORAL_TABLET | Freq: Three times a day (TID) | ORAL | Status: DC | PRN

## 2015-11-03 MED ORDER — AMOXICILLIN 500 MG PO CAPS
500.0000 mg | ORAL_CAPSULE | Freq: Once | ORAL | Status: AC
  Administered 2015-11-03: 500 mg via ORAL
  Filled 2015-11-03: qty 1

## 2015-11-03 NOTE — ED Notes (Signed)
Pt was in shower and his nose began bleeding.  [facility] gave 1 spray of afrin in R nares.  Last week pt was seen twice for same - once at [facility] where it was cauderized and once at the ENT Dr Vassie Loll who cauderized him again.  Initial bp 170/100, hr 68.  Pt took home bp before leaving house with ems.

## 2015-11-03 NOTE — Discharge Instructions (Signed)

## 2015-11-08 DIAGNOSIS — J342 Deviated nasal septum: Secondary | ICD-10-CM | POA: Diagnosis not present

## 2015-11-08 DIAGNOSIS — R040 Epistaxis: Secondary | ICD-10-CM | POA: Diagnosis not present

## 2015-11-12 NOTE — ED Provider Notes (Signed)
CSN: [id number]     Arrival date & time 11/03/15  [id number] History   First MD Initiated Contact with Patient 11/03/15 [phone]     Chief Complaint  Patient presents with  . Epistaxis      HPI  She presents for valuation nosebleed. Started bleeding in the shower this morning. Seen last week twice for nosebleed. Had cautery. Seen by his ENT and had a repeat cautery done. All right-sided bleeding. Is not anticoagulated. Symptoms started approximately one half hour of this morning. Has not had packing placed in his nares.  Past Medical History  Diagnosis Date  . Complication of anesthesia   . Coronary artery disease   . Hypertension   . Diabetes mellitus without complication ([place])   . Chronic kidney disease     Hx of stones  . Headache(784.0)     migraines  . Cancer ([place])     skin ca  . Arthritis   . Spinal stenosis   . Diverticulitis   . Glaucoma   . Ventricular dilatation     Right  . Hyperlipemia   . 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 Cardiac catheterization    . Coronary angioplasty    . Lithotripsy    . Colonoscopy N/A 06/15/2013    Procedure: COLONOSCOPY;  Surgeon: Beryle Beams, MD;  Location: [facility];  Service: Endoscopy;  Laterality: N/A;  . Cholecystectomy  1990  . Appendectomy  1991  . Bowel resection  1985   Family History  Problem Relation Age of Onset  . Heart disease Father   . Heart attack Father   . Heart disease Paternal Grandmother    Social History  Substance Use Topics  . Smoking status: Never Smoker   . Smokeless tobacco: Never Used  . Alcohol Use: 0.0 oz/week    0 Standard drinks or equivalent per week     Comment: ocas    Review of Systems  Constitutional: Negative for fever, chills, diaphoresis, appetite change and fatigue.  HENT: Positive for nosebleeds. Negative for mouth sores, sore throat and trouble swallowing.   Eyes: Negative for  visual disturbance.  Respiratory: Negative for cough, chest tightness, shortness of breath and wheezing.   Cardiovascular: Negative for chest pain.  Gastrointestinal: Negative for nausea, vomiting, abdominal pain, diarrhea and abdominal distention.  Endocrine: Negative for polydipsia, polyphagia and polyuria.  Genitourinary: Negative for dysuria, frequency and hematuria.  Musculoskeletal: Negative for gait problem.  Skin: Negative for color change, pallor and rash.  Neurological: Negative for dizziness, syncope, light-headedness and headaches.  Hematological: Does not bruise/bleed easily.  Psychiatric/Behavioral: Negative for behavioral problems and confusion.      Allergies  Amaryl; Skelaxin; Metformin and related; Levofloxacin; and Lisinopril  Home Medications   Prior to Admission medications   Medication Sig Start Date End Date Taking? Authorizing Provider  acetaminophen (TYLENOL) 650 MG CR tablet Take 650 mg by mouth 2 (two) times daily.   Yes Historical Provider, MD  amLODipine (NORVASC) 10 MG tablet Take 1 tablet daily   "office visit needed for refills" Patient taking differently: Take 10 mg by mouth at bedtime. Take 1 tablet daily   "office visit needed for refills" 09/25/15  Yes Robyn Haber, MD  aspirin 81 MG tablet Take 81 mg by mouth at bedtime.    Yes Historical Provider, MD  atorvastatin (LIPITOR) 80 MG tablet TAKE 1 TABLET DAILY Patient taking differently: Take 80 mg  by mouth every morning. TAKE 1 TABLET DAILY 06/18/15  Yes Robyn Haber, MD  bimatoprost (LUMIGAN) 0.03 % ophthalmic solution Place 1 drop into both eyes at bedtime.   Yes Historical Provider, MD  brimonidine-timolol (COMBIGAN) 0.2-0.5 % ophthalmic solution Place 1 drop into both eyes 2 (two) times daily.   Yes Historical Provider, MD  chlorthalidone (HYGROTON) 25 MG tablet Take 1 tablet (25 mg total) by mouth daily. 06/21/15  Yes Robyn Haber, MD  Cholecalciferol (VITAMIN D-3 PO) Take 5,000 Units by  mouth 2 (two) times a week.    Yes Historical Provider, MD  dorzolamide (TRUSOPT) 2 % ophthalmic solution INSTILL 1 DROP INTO BOTH EYES TWICE DAILY 09/11/15  Yes Historical Provider, MD  fish oil-omega-3 fatty acids 1000 MG capsule Take 2 g by mouth daily.   Yes Historical Provider, MD  gemfibrozil (LOPID) 600 MG tablet TAKE 1 TABLET DAILY. Patient taking differently: Take 600 mg by mouth every evening. TAKE 1 TABLET DAILY. 06/18/15  Yes Robyn Haber, MD  metFORMIN (GLUCOPHAGE-XR) 500 MG 24 hr tablet Take 1 tablet (500 mg total) by mouth daily with breakfast. 04/16/15  Yes Robyn Haber, MD  multivitamin-lutein [facility]) CAPS capsule Take 1 capsule by mouth 2 (two) times daily.   Yes Historical Provider, MD  OVER THE COUNTER MEDICATION Apply 1 application topically 2 (two) times daily. Doan's cream- Numbing cream   Yes Historical Provider, MD  propranolol ER (INDERAL LA) 60 MG 24 hr capsule Take 1 capsule (60 mg total) by mouth 2 (two) times daily. 02/28/15  Yes Robyn Haber, MD  sitaGLIPtin (JANUVIA) 100 MG tablet Take 1 tablet (100 mg total) by mouth daily. Patient taking differently: Take 100 mg by mouth at bedtime.  02/28/15  Yes Robyn Haber, MD  amoxicillin (AMOXIL) 500 MG capsule Take 1 capsule (500 mg total) by mouth 3 (three) times daily. 11/03/15   Tanna Furry, MD  clotrimazole-betamethasone (LOTRISONE) cream Apply 1 application topically 2 (two) times daily. Patient not taking: Reported on 10/20/2015 10/31/13   Robyn Haber, MD  diclofenac sodium (VOLTAREN) 1 % GEL Apply 2 g topically 4 (four) times daily. Patient not taking: Reported on 10/20/2015 07/25/15   Robyn Haber, MD  doxycycline (VIBRAMYCIN) 100 MG capsule Take 1 capsule (100 mg total) by mouth 2 (two) times daily. Patient not taking: Reported on 11/03/2015 07/25/15   Robyn Haber, MD  HYDROcodone-acetaminophen (NORCO/VICODIN) 5-325 MG tablet Take 1 tablet by mouth every 4 (four) hours as needed. 11/03/15   Tanna Furry, MD  mupirocin ointment (BACTROBAN) 2 % Place 1 application into the nose 2 (two) times daily. Patient not taking: Reported on 10/20/2015 11/22/14   Robyn Haber, MD  ondansetron (ZOFRAN ODT) 4 MG disintegrating tablet Take 1 tablet (4 mg total) by mouth every 8 (eight) hours as needed for nausea. 11/03/15   Tanna Furry, MD  SUMAtriptan (IMITREX) 50 MG tablet Take 1 tablet (50 mg total) by mouth every 2 (two) hours as needed for migraine. 09/01/12 10/20/15  Robyn Haber, MD   BP 133/75 mmHg  Pulse 68  Temp(Src) 97.9 F (36.6 C) (Oral)  Resp 20  Ht 6' (1.829 m)  Wt 250 lb (113.399 kg)  BMI 33.90 kg/m2  SpO2 95% Physical Exam  Constitutional: He is oriented to person, place, and time. He appears well-developed and well-nourished. No distress.  HENT:  Head: Normocephalic.  Nose:    No blood in the posterior pharynx  Eyes: Conjunctivae are normal. Pupils are equal, round, and reactive to light.  No scleral icterus.  Neck: Normal range of motion. Neck supple. No thyromegaly present.  Cardiovascular: Normal rate and regular rhythm.  Exam reveals no gallop and no friction rub.   No murmur heard. Pulmonary/Chest: Effort normal and breath sounds normal. No respiratory distress. He has no wheezes. He has no rales.  Abdominal: Soft. Bowel sounds are normal. He exhibits no distension. There is no tenderness. There is no rebound.  Musculoskeletal: Normal range of motion.  Neurological: He is alert and oriented to person, place, and time.  Skin: Skin is warm and dry. No rash noted.  Psychiatric: He has a normal mood and affect. His behavior is normal.    ED Course  Procedures (including critical care time) Labs Review Labs Reviewed - No data to display  Imaging Review No results found. I have personally reviewed and evaluated these images and lab results as part of my medical decision-making.   EKG Interpretation None      MDM   Final diagnoses:  Epistaxis, recurrent     [place] with Neo-Synephrine placed the right anterior nares for 20 minutes. Then removed and reinspected. This exposes large area of excoriation in diffuse bleeding from the anterior septum. No obvious posterior bleeding.  Bleeding was resolved after Merocel sponge placed.  Observed  for an additional 30 minutes. Appropriate for discharge. Asked to follow-up with his ENT for removal and reinspection for possible definitive treatment or cautery.    Tanna Furry, MD 11/12/15 [phone]

## 2015-11-21 DIAGNOSIS — M47812 Spondylosis without myelopathy or radiculopathy, cervical region: Secondary | ICD-10-CM | POA: Diagnosis not present

## 2015-11-21 DIAGNOSIS — M5137 Other intervertebral disc degeneration, lumbosacral region: Secondary | ICD-10-CM | POA: Diagnosis not present

## 2015-11-21 DIAGNOSIS — M5136 Other intervertebral disc degeneration, lumbar region: Secondary | ICD-10-CM | POA: Diagnosis not present

## 2015-11-21 DIAGNOSIS — M5412 Radiculopathy, cervical region: Secondary | ICD-10-CM | POA: Diagnosis not present

## 2015-11-21 DIAGNOSIS — M4806 Spinal stenosis, lumbar region: Secondary | ICD-10-CM | POA: Diagnosis not present

## 2015-11-21 DIAGNOSIS — M546 Pain in thoracic spine: Secondary | ICD-10-CM | POA: Diagnosis not present

## 2015-11-22 DIAGNOSIS — S63657D Sprain of metacarpophalangeal joint of left little finger, subsequent encounter: Secondary | ICD-10-CM | POA: Diagnosis not present

## 2015-12-10 DIAGNOSIS — S63657D Sprain of metacarpophalangeal joint of left little finger, subsequent encounter: Secondary | ICD-10-CM | POA: Diagnosis not present

## 2015-12-23 DIAGNOSIS — Z6829 Body mass index (BMI) 29.0-29.9, adult: Secondary | ICD-10-CM | POA: Diagnosis not present

## 2015-12-23 DIAGNOSIS — M5137 Other intervertebral disc degeneration, lumbosacral region: Secondary | ICD-10-CM | POA: Diagnosis not present

## 2015-12-25 DIAGNOSIS — M47812 Spondylosis without myelopathy or radiculopathy, cervical region: Secondary | ICD-10-CM | POA: Diagnosis not present

## 2015-12-25 DIAGNOSIS — M4806 Spinal stenosis, lumbar region: Secondary | ICD-10-CM | POA: Diagnosis not present

## 2015-12-25 DIAGNOSIS — M5137 Other intervertebral disc degeneration, lumbosacral region: Secondary | ICD-10-CM | POA: Diagnosis not present

## 2015-12-31 ENCOUNTER — Other Ambulatory Visit: Payer: Self-pay | Admitting: Family Medicine

## 2015-12-31 DIAGNOSIS — H401133 Primary open-angle glaucoma, bilateral, severe stage: Secondary | ICD-10-CM | POA: Diagnosis not present

## 2015-12-31 DIAGNOSIS — E119 Type 2 diabetes mellitus without complications: Secondary | ICD-10-CM

## 2015-12-31 DIAGNOSIS — H2513 Age-related nuclear cataract, bilateral: Secondary | ICD-10-CM | POA: Diagnosis not present

## 2016-01-01 ENCOUNTER — Ambulatory Visit (INDEPENDENT_AMBULATORY_CARE_PROVIDER_SITE_OTHER): Payer: Medicare Other | Admitting: Family Medicine

## 2016-01-01 ENCOUNTER — Other Ambulatory Visit: Payer: Medicare Other | Admitting: *Deleted

## 2016-01-01 VITALS — BP 132/82 | HR 62 | Temp 98.4°F | Resp 18 | Ht 72.0 in | Wt 212.0 lb

## 2016-01-01 DIAGNOSIS — E119 Type 2 diabetes mellitus without complications: Secondary | ICD-10-CM

## 2016-01-01 DIAGNOSIS — G43001 Migraine without aura, not intractable, with status migrainosus: Secondary | ICD-10-CM

## 2016-01-01 DIAGNOSIS — R51 Headache: Secondary | ICD-10-CM

## 2016-01-01 DIAGNOSIS — M25461 Effusion, right knee: Secondary | ICD-10-CM

## 2016-01-01 DIAGNOSIS — E785 Hyperlipidemia, unspecified: Secondary | ICD-10-CM

## 2016-01-01 DIAGNOSIS — R519 Headache, unspecified: Secondary | ICD-10-CM

## 2016-01-01 LAB — COMPLETE METABOLIC PANEL WITH GFR
ALT: 20 U/L (ref 9–46)
AST: 16 U/L (ref 10–35)
Albumin: 4.1 g/dL (ref 3.6–5.1)
Alkaline Phosphatase: 58 U/L (ref 40–115)
BUN: 13 mg/dL (ref 7–25)
CO2: 26 mmol/L (ref 20–31)
Calcium: 10 mg/dL (ref 8.6–10.3)
Chloride: 103 mmol/L (ref 98–110)
Creat: 0.89 mg/dL (ref 0.70–1.25)
GFR, Est African American: >89 mL/min (ref >=60)
GFR, Est Non African American: 87 mL/min (ref >=60)
Glucose, Bld: 133 mg/dL — ABNORMAL HIGH (ref 65–99)
Potassium: 3.9 mmol/L (ref 3.5–5.3)
Sodium: 142 mmol/L (ref 135–146)
Total Bilirubin: 0.6 mg/dL (ref 0.2–1.2)
Total Protein: 6.2 g/dL (ref 6.1–8.1)

## 2016-01-01 LAB — POCT GLYCOSYLATED HEMOGLOBIN (HGB A1C): Hemoglobin A1C: 6.1

## 2016-01-01 MED ORDER — ATORVASTATIN CALCIUM 80 MG PO TABS: ORAL_TABLET | ORAL | Status: DC

## 2016-01-01 MED ORDER — GEMFIBROZIL 600 MG PO TABS: ORAL_TABLET | ORAL | Status: DC

## 2016-01-01 MED ORDER — AMLODIPINE BESYLATE 10 MG PO TABS: ORAL_TABLET | ORAL | Status: DC

## 2016-01-01 MED ORDER — SUMATRIPTAN SUCCINATE 50 MG PO TABS: 50.0000 mg | ORAL_TABLET | ORAL | Status: DC | PRN

## 2016-01-01 MED ORDER — CHLORTHALIDONE 25 MG PO TABS: 25.0000 mg | ORAL_TABLET | Freq: Every day | ORAL | Status: DC

## 2016-01-01 MED ORDER — PROPRANOLOL HCL ER 60 MG PO CP24: 60.0000 mg | ORAL_CAPSULE | Freq: Two times a day (BID) | ORAL | Status: DC

## 2016-01-01 MED ORDER — METFORMIN HCL ER 500 MG PO TB24: 500.0000 mg | ORAL_TABLET | Freq: Every day | ORAL | Status: DC

## 2016-01-01 NOTE — Progress Notes (Signed)
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  Also has chronic sacral  Pain with progressive gait disturbance.    Voltaren did not help the knee.  He is using a cane.  Pain is on lateral joint line and worse with kneeling.  Wife Mickel Baas helping at [employer] with filing taxes   Objective: BP 132/82 mmHg  Pulse 62  Temp(Src) 98.4 F (36.9 C) (Oral)  Resp 18  Ht 6' (1.829 m)  Wt 212 lb (96.163 kg)  BMI 28.75 kg/m2  SpO2 96%  Eye exam yesterday:  Dr. Katy Fitch, glaucoma meds changed Neck:  Supple, no adenopathy or thyromegaly or bruit Chest:  Clear Heart:  Reg, no murmur   Results for orders placed or performed in visit on 01/01/16  COMPLETE METABOLIC PANEL WITH GFR  Result Value Ref Range   Sodium 142 135 - 146 mmol/L   Potassium 3.9 3.5 - 5.3 mmol/L   Chloride 103 98 - 110 mmol/L   CO2 26 20 - 31 mmol/L   Glucose, Bld 133 (H) 65 - 99 mg/dL   BUN 13 7 - 25 mg/dL   Creat 0.89 0.70 - 1.25 mg/dL   Total Bilirubin 0.6 0.2 - 1.2 mg/dL   Alkaline Phosphatase 58 40 - 115 U/L   AST 16 10 - 35 U/L   ALT 20 9 - 46 U/L   Total Protein 6.2 6.1 - 8.1 g/dL   Albumin 4.1 3.6 - 5.1 g/dL   Calcium 10.0 8.6 - 10.3 mg/dL   GFR, Est African American >89 >=60 mL/min   GFR, Est Non African American 87 >=60 mL/min  POCT glycosylated hemoglobin (Hb A1C)  Result Value Ref Range   Hemoglobin A1C 6.1    Right knee:  Mild lateral swelling.  Mild effusion 1cc marcaine and 1cc depomedrol 80 mg/ml injected into right lateral knee. FROM  Assessment:  Stable DM Right knee arthritis Sacral disc disease. Stable hypertension Glaucoma  This chart was scribed in my presence and reviewed by me personally.    ICD-9-CM ICD-10-CM   1. Migraine without aura and with status migrainosus, not intractable 346.12 G43.001 propranolol ER (INDERAL LA) 60 MG 24 hr capsule  2. Controlled type 2 diabetes mellitus without complication, without long-term current use of insulin  (HCC) 250.00 E11.9 metFORMIN (GLUCOPHAGE-XR) 500 MG 24 hr tablet  3. Hyperlipidemia 272.4 E78.5 gemfibrozil (LOPID) 600 MG tablet     atorvastatin (LIPITOR) 80 MG tablet  4. Persistent headaches 784.0 R51 SUMAtriptan (IMITREX) 50 MG tablet     Signed, Robyn Haber, MD

## 2016-01-01 NOTE — Patient Instructions (Signed)
[name] talk:  The Transformative power of classical music

## 2016-01-03 DIAGNOSIS — I2510 Atherosclerotic heart disease of native coronary artery without angina pectoris: Secondary | ICD-10-CM | POA: Diagnosis not present

## 2016-01-03 DIAGNOSIS — E119 Type 2 diabetes mellitus without complications: Secondary | ICD-10-CM | POA: Diagnosis not present

## 2016-01-03 DIAGNOSIS — I10 Essential (primary) hypertension: Secondary | ICD-10-CM | POA: Diagnosis not present

## 2016-01-03 DIAGNOSIS — Z136 Encounter for screening for cardiovascular disorders: Secondary | ICD-10-CM | POA: Diagnosis not present

## 2016-01-06 ENCOUNTER — Ambulatory Visit (INDEPENDENT_AMBULATORY_CARE_PROVIDER_SITE_OTHER): Payer: Medicare Other

## 2016-01-06 ENCOUNTER — Ambulatory Visit (INDEPENDENT_AMBULATORY_CARE_PROVIDER_SITE_OTHER): Payer: Medicare Other | Admitting: Emergency Medicine

## 2016-01-06 VITALS — BP 110/80 | HR 68 | Temp 98.3°F | Resp 17

## 2016-01-06 DIAGNOSIS — N200 Calculus of kidney: Secondary | ICD-10-CM

## 2016-01-06 DIAGNOSIS — R319 Hematuria, unspecified: Secondary | ICD-10-CM

## 2016-01-06 LAB — POCT URINALYSIS DIP (MANUAL ENTRY)
Bilirubin, UA: NEGATIVE
Glucose, UA: NEGATIVE
Ketones, POC UA: NEGATIVE
Leukocytes, UA: NEGATIVE
NITRITE UA: NEGATIVE
PH UA: 6.5
PROTEIN UA: NEGATIVE
SPEC GRAV UA: 1.02
UROBILINOGEN UA: 0.2

## 2016-01-06 LAB — POC MICROSCOPIC URINALYSIS (UMFC): Mucus: ABSENT

## 2016-01-06 MED ORDER — CEPHALEXIN 500 MG PO CAPS: 500.0000 mg | ORAL_CAPSULE | Freq: Four times a day (QID) | ORAL | Status: DC

## 2016-01-06 MED ORDER — CEPHALEXIN 500 MG PO CAPS: 500.0000 mg | ORAL_CAPSULE | Freq: Two times a day (BID) | ORAL | Status: DC

## 2016-01-06 NOTE — Progress Notes (Addendum)
By signing my name below, I, Moises Blood, attest that this documentation has been prepared under the direction and in the presence of Arlyss Queen, MD. Electronically Signed: Moises Blood, [place]. 01/06/2016 , 2:51 PM .  Patient was seen in room 8 .  Chief Complaint:  Chief Complaint  Patient presents with  . Nephrolithiasis    Passed stone around 9am today    HPI: [name] is a 70 y.o. male who reports to [facility] today complaining of kidney stone that was passed this morning at 9:00AM. He had some bleeding from penis when passing the stone this morning. He brought in the stone in a bottle and the stone measured about a little over 1cm. He usually has a kidney stone once a year. He can feel it coming when he has pressure and discomfort in his back. Then, he would feel pain in his penis before and during passing the stone.   Past Medical History  Diagnosis Date  . Complication of anesthesia   . Coronary artery disease   . Hypertension   . Diabetes mellitus without complication ([place])   . Chronic kidney disease     Hx of stones  . Headache(784.0)     migraines  . Cancer ([place])     skin ca  . Arthritis   . Spinal stenosis   . Diverticulitis   . Glaucoma   . Ventricular dilatation     Right  . Hyperlipemia   . Atherosclerotic heart disease native coronary artery w/angina pectoris [facility])    Past Surgical History  Procedure Laterality Date  . Cardiac catheterization    . Coronary angioplasty    . Lithotripsy    . Colonoscopy N/A 06/15/2013    Procedure: COLONOSCOPY;  Surgeon: Beryle Beams, MD;  Location: [facility] END[facility]COPY;  Service: Endoscopy;  Laterality: N/A;  . Cholecystectomy  1990  . Appendectomy  1991  . Bowel resection  1985   Social History   Social History  . Marital Status: Married    Spouse Name: N/A  . Number of Children: 0  . Years of Education: Masters +   Occupational History  . Retired    Social History Main Topics  . Smoking status: Never  Smoker   . Smokeless tobacco: Never Used  . Alcohol Use: 0.0 oz/week    0 Standard drinks or equivalent per week     Comment: ocas  . Drug Use: No  . Sexual Activity: Not Asked   Other Topics Concern  . None   Social History Narrative   2 cups of coffee a day, soda or tea about 3 times a week.    Family History  Problem Relation Age of Onset  . Heart disease Father   . Heart attack Father   . Heart disease Paternal Grandmother    Allergies  Allergen Reactions  . Amaryl Other (See Comments)    Gives pt migraines  . Skelaxin [Metaxalone] Rash    Cherry red rash with sloughing off of skin at left upper chest/shoulder  . Hydrocodone     Does not have much affect  . Metformin And Related Diarrhea  . Levofloxacin Nausea Only  . Lisinopril Other (See Comments)    cough   Prior to Admission medications   Medication Sig Start Date End Date Taking? Authorizing Provider  acetaminophen (TYLENOL) 650 MG CR tablet Take 650 mg by mouth 2 (two) times daily.    Historical Provider, MD  amLODipine (NORVASC) 10 MG  tablet Take 1 tablet daily 01/01/16   Robyn Haber, MD  aspirin 81 MG tablet Take 81 mg by mouth at bedtime.     Historical Provider, MD  atorvastatin (LIPITOR) 80 MG tablet TAKE 1 TABLET DAILY 01/01/16   Robyn Haber, MD  bimatoprost (LUMIGAN) 0.03 % ophthalmic solution Place 1 drop into both eyes at bedtime. Reported on 01/01/2016    Historical Provider, MD  brimonidine-timolol (COMBIGAN) 0.2-0.5 % ophthalmic solution Place 1 drop into both eyes 2 (two) times daily.    Historical Provider, MD  chlorthalidone (HYGROTON) 25 MG tablet Take 1 tablet (25 mg total) by mouth daily. 01/01/16   Robyn Haber, MD  Cholecalciferol (VITAMIN D-3 PO) Take 5,000 Units by mouth 2 (two) times a week.     Historical Provider, MD  dorzolamide (TRUSOPT) 2 % ophthalmic solution INSTILL 1 DROP INTO BOTH EYES TWICE DAILY 09/11/15   Historical Provider, MD  fish oil-omega-3 fatty acids 1000 MG  capsule Take 2 g by mouth daily.    Historical Provider, MD  gemfibrozil (LOPID) 600 MG tablet TAKE 1 TABLET DAILY. 01/01/16   Robyn Haber, MD  metFORMIN (GLUCOPHAGE-XR) 500 MG 24 hr tablet Take 1 tablet (500 mg total) by mouth daily with breakfast. 01/01/16   Robyn Haber, MD  multivitamin-lutein [facility]) CAPS capsule Take 1 capsule by mouth 2 (two) times daily.    Historical Provider, MD  OVER THE COUNTER MEDICATION Apply 1 application topically 2 (two) times daily. Doan's cream- Numbing cream    Historical Provider, MD  propranolol ER (INDERAL LA) 60 MG 24 hr capsule Take 1 capsule (60 mg total) by mouth 2 (two) times daily. 01/01/16   Robyn Haber, MD  sitaGLIPtin (JANUVIA) 100 MG tablet Take 1 tablet (100 mg total) by mouth daily. Patient taking differently: Take 100 mg by mouth at bedtime.  02/28/15   Robyn Haber, MD  SUMAtriptan (IMITREX) 50 MG tablet Take 1 tablet (50 mg total) by mouth every 2 (two) hours as needed for migraine. 01/01/16 02/18/19  Robyn Haber, MD     ROS:  Constitutional: negative for fever, chills, night sweats, weight changes, or fatigue  HEENT: negative for vision changes, hearing loss, congestion, rhinorrhea, ST, epistaxis, or sinus pressure Cardiovascular: negative for chest pain or palpitations Respiratory: negative for hemoptysis, wheezing, shortness of breath, or cough Abdominal: negative for abdominal pain, nausea, vomiting, diarrhea, or constipation Dermatological: negative for rash GU: positive for hematuria Neurologic: negative for headache, dizziness, or syncope All other systems reviewed and are otherwise negative with the exception to those above and in the HPI.  PHYSICAL EXAM: Filed Vitals:   01/06/16 1301  BP: 110/80  Pulse: 68  Temp: 98.3 F (36.8 C)  Resp: 17   There is no weight on file to calculate BMI.   General: Alert, no acute distress HEENT:  Normocephalic, atraumatic, oropharynx patent. Eye: Juliette Mangle  [facility] Cardiovascular:  Regular rate and rhythm, no rubs murmurs or gallops.  No Carotid bruits, radial pulse intact. No pedal edema.  Respiratory: Clear to auscultation bilaterally.  No wheezes, rales, or rhonchi.  No cyanosis, no use of accessory musculature Abdominal: No cva tenderness, no abdominal tenderness, No organomegaly, abdomen is soft and non-tender, positive bowel sounds. No masses. Musculoskeletal: Gait intact. No edema, tenderness Skin: No rashes. Neurologic: Facial musculature symmetric. Psychiatric: Patient acts appropriately throughout our interaction.  Lymphatic: No cervical or submandibular lymphadenopathy Genitourinary/Anorectal: No acute findings, penis normal  LABS:  Results for orders placed or performed in visit on 01/06/16  POCT Microscopic Urinalysis ([facility])  Result Value Ref Range   WBC,UR,HPF,POC None None WBC/hpf   RBC,UR,HPF,POC Many (A) None RBC/hpf   Bacteria None None, Too numerous to count   Mucus Absent Absent   Epithelial Cells, UR Per Microscopy None None, Too numerous to count cells/hpf  POCT urinalysis dipstick  Result Value Ref Range   Color, UA yellow yellow   Clarity, UA clear clear   Glucose, UA negative negative   Bilirubin, UA negative negative   Ketones, POC UA negative negative   Spec Grav, UA 1.020    Blood, UA moderate (A) negative   pH, UA 6.5    Protein Ur, POC negative negative   Urobilinogen, UA 0.2    Nitrite, UA Negative Negative   Leukocytes, UA Negative Negative   EKG/XRAY:   Primary read interpreted by Dr. Everlene Farrier at [facility].   ASSESSMENT/PLAN: Burnis Medin go ahead and cover with antibiotics. He does have what appears to be residual stones in the right and left kidney. Urine culture was sent.I personally performed the services described in this documentation, which was scribed in my presence. The recorded information has been reviewed and is accurate. Patient has had elevated calciums in the past and I suspect he has parathyroid  disease but at the present time he is not interested in seeing a endocrinologist for evaluation.   Gross sideeffects, risk and benefits, and alternatives of medications d/w patient. Patient is aware that all medications have potential sideeffects and we are unable to predict every sideeffect or drug-drug interaction that may occur.  Arlyss Queen MD 01/06/2016 2:51 PM

## 2016-01-06 NOTE — Patient Instructions (Addendum)
Kidney Stones Kidney stones (urolithiasis) are deposits that form inside your kidneys. The intense pain is caused by the stone moving through the urinary tract. When the stone moves, the ureter goes into spasm around the stone. The stone is usually passed in the urine.  CAUSES   A disorder that makes certain neck glands produce too much parathyroid hormone (primary hyperparathyroidism).  A buildup of uric acid crystals, similar to gout in your joints.  Narrowing (stricture) of the ureter.  A kidney obstruction present at birth (congenital obstruction).  Previous surgery on the kidney or ureters.  Numerous kidney infections. SYMPTOMS   Feeling sick to your stomach (nauseous).  Throwing up (vomiting).  Blood in the urine (hematuria).  Pain that usually spreads (radiates) to the groin.  Frequency or urgency of urination. DIAGNOSIS   Taking a history and physical exam.  Blood or urine tests.  CT scan.  Occasionally, an examination of the inside of the urinary bladder (cystoscopy) is performed. TREATMENT   Observation.  Increasing your fluid intake.  Extracorporeal shock wave lithotripsy--This is a noninvasive procedure that uses shock waves to break up kidney stones.  Surgery may be needed if you have severe pain or persistent obstruction. There are various surgical procedures. Most of the procedures are performed with the use of small instruments. Only small incisions are needed to accommodate these instruments, so recovery time is minimized. The size, location, and chemical composition are all important variables that will determine the proper choice of action for you. Talk to your health care provider to better understand your situation so that you will minimize the risk of injury to yourself and your kidney.  HOME CARE INSTRUCTIONS   Drink enough water and fluids to keep your urine clear or pale yellow. This will help you to pass the stone or stone fragments.  Strain  all urine through the provided strainer. Keep all particulate matter and stones for your health care provider to see. The stone causing the pain may be as small as a grain of salt. It is very important to use the strainer each and every time you pass your urine. The collection of your stone will allow your health care provider to analyze it and verify that a stone has actually passed. The stone analysis will often identify what you can do to reduce the incidence of recurrences.  Only take over-the-counter or prescription medicines for pain, discomfort, or fever as directed by your health care provider.  Keep all follow-up visits as told by your health care provider. This is important.  Get follow-up X-rays if required. The absence of pain does not always mean that the stone has passed. It may have only stopped moving. If the urine remains completely obstructed, it can cause loss of kidney function or even complete destruction of the kidney. It is your responsibility to make sure X-rays and follow-ups are completed. Ultrasounds of the kidney can show blockages and the status of the kidney. Ultrasounds are not associated with any radiation and can be performed easily in a matter of minutes.  Make changes to your daily diet as told by your health care provider. You may be told to:  Limit the amount of salt that you eat.  Eat 5 or more servings of fruits and vegetables each day.  Limit the amount of meat, poultry, fish, and eggs that you eat.  Collect a 24-hour urine sample as told by your health care provider.You may need to collect another urine sample every 6-12   months. SEEK MEDICAL CARE IF:  You experience pain that is progressive and unresponsive to any pain medicine you have been prescribed. SEEK IMMEDIATE MEDICAL CARE IF:   Pain cannot be controlled with the prescribed medicine.  You have a fever or shaking chills.  The severity or intensity of pain increases over 18 hours and is not  relieved by pain medicine.  You develop a new onset of abdominal pain.  You feel faint or pass out.  You are unable to urinate.   This information is not intended to replace advice given to you by your health care provider. Make sure you discuss any questions you have with your health care provider.   Document Released: 11/02/2005 Document Revised: 07/24/2015 Document Reviewed: 04/05/2013 Elsevier Interactive Patient Education [employer]. Because you received an x-ray today, you will receive an invoice from [facility]. Please contact [facility] at [phone] with questions or concerns regarding your invoice. Our billing staff will not be able to assist you with those questions.

## 2016-01-08 DIAGNOSIS — M4806 Spinal stenosis, lumbar region: Secondary | ICD-10-CM | POA: Diagnosis not present

## 2016-01-08 DIAGNOSIS — M545 Low back pain: Secondary | ICD-10-CM | POA: Diagnosis not present

## 2016-01-08 DIAGNOSIS — M5137 Other intervertebral disc degeneration, lumbosacral region: Secondary | ICD-10-CM | POA: Diagnosis not present

## 2016-01-08 DIAGNOSIS — M5412 Radiculopathy, cervical region: Secondary | ICD-10-CM | POA: Diagnosis not present

## 2016-01-08 DIAGNOSIS — M546 Pain in thoracic spine: Secondary | ICD-10-CM | POA: Diagnosis not present

## 2016-01-08 DIAGNOSIS — Z6828 Body mass index (BMI) 28.0-28.9, adult: Secondary | ICD-10-CM | POA: Diagnosis not present

## 2016-01-08 DIAGNOSIS — M47812 Spondylosis without myelopathy or radiculopathy, cervical region: Secondary | ICD-10-CM | POA: Diagnosis not present

## 2016-01-08 DIAGNOSIS — M5136 Other intervertebral disc degeneration, lumbar region: Secondary | ICD-10-CM | POA: Diagnosis not present

## 2016-01-08 LAB — URINE CULTURE
COLONY COUNT: NO GROWTH
ORGANISM ID, BACTERIA: NO GROWTH

## 2016-01-09 LAB — STONE ANALYSIS: Stone Weight KSTONE: 0.272 g

## 2016-01-20 DIAGNOSIS — M5137 Other intervertebral disc degeneration, lumbosacral region: Secondary | ICD-10-CM | POA: Diagnosis not present

## 2016-01-20 DIAGNOSIS — M545 Low back pain: Secondary | ICD-10-CM | POA: Diagnosis not present

## 2016-01-29 DIAGNOSIS — F4542 Pain disorder with related psychological factors: Secondary | ICD-10-CM | POA: Diagnosis not present

## 2016-01-29 DIAGNOSIS — M4806 Spinal stenosis, lumbar region: Secondary | ICD-10-CM | POA: Diagnosis not present

## 2016-02-11 DIAGNOSIS — M546 Pain in thoracic spine: Secondary | ICD-10-CM | POA: Diagnosis not present

## 2016-02-11 DIAGNOSIS — M545 Low back pain: Secondary | ICD-10-CM | POA: Diagnosis not present

## 2016-02-11 DIAGNOSIS — M5412 Radiculopathy, cervical region: Secondary | ICD-10-CM | POA: Diagnosis not present

## 2016-02-11 DIAGNOSIS — M5137 Other intervertebral disc degeneration, lumbosacral region: Secondary | ICD-10-CM | POA: Diagnosis not present

## 2016-02-13 ENCOUNTER — Ambulatory Visit (INDEPENDENT_AMBULATORY_CARE_PROVIDER_SITE_OTHER): Payer: Medicare Other | Admitting: Family Medicine

## 2016-02-13 VITALS — BP 118/64 | HR 89 | Temp 98.3°F | Resp 18 | Ht 72.0 in | Wt 208.0 lb

## 2016-02-13 DIAGNOSIS — M5136 Other intervertebral disc degeneration, lumbar region: Secondary | ICD-10-CM

## 2016-02-13 DIAGNOSIS — R159 Full incontinence of feces: Secondary | ICD-10-CM | POA: Diagnosis not present

## 2016-02-13 DIAGNOSIS — E119 Type 2 diabetes mellitus without complications: Secondary | ICD-10-CM

## 2016-02-13 DIAGNOSIS — K591 Functional diarrhea: Secondary | ICD-10-CM

## 2016-02-13 LAB — POCT CBC
Granulocyte percent: 59.3 %G (ref 37–80)
HCT, POC: 43.3 % — AB (ref 43.5–53.7)
HEMOGLOBIN: 15.7 g/dL (ref 14.1–18.1)
LYMPH, POC: 2.8 (ref 0.6–3.4)
MCH: 33.3 pg — AB (ref 27–31.2)
MCHC: 36.4 g/dL — AB (ref 31.8–35.4)
MCV: 91.7 fL (ref 80–97)
MID (cbc): 0.4 (ref 0–0.9)
MPV: 7.7 fL (ref 0–99.8)
PLATELET COUNT, POC: 108 10*3/uL — AB (ref 142–424)
POC Granulocyte: 4.6 (ref 2–6.9)
POC LYMPH PERCENT: 35.8 %L (ref 10–50)
POC MID %: 4.9 % (ref 0–12)
RBC: 4.72 M/uL (ref 4.69–6.13)
RDW, POC: 13.8 %
WBC: 7.8 10*3/uL (ref 4.6–10.2)

## 2016-02-13 NOTE — Progress Notes (Signed)
Subjective:    Patient ID: [name], male    DOB: June 11, 1946, 70 y.o.   MRN: [id number]  02/13/2016  Diarrhea   HPI This 70 y.o. male presents for evaluation of diarrhea.  Taking Metformin for years and years; has always caused diarrhea; must be very careful; now more prevalent.  A lot of lower back pain; normal signals are not getting to the brain.  Large accidents at night; encoperesis.  Wakes up after having encoparesis.  No recall of event.  No accidents during the day.  Very rarely leakage.  Urinating without difficulties.  No numbness saddle.  If takes imodium two tablets will cause diarrhea.  Will take Imodium when wakes up in the middle of the night.  Colonoscopy 06/15/2013 [place].  Taking Metformin ER 500mg  one daily. Onset of accidents at night in 11/2015.  No acute accident.  R medial thigh pain; no traditional sciatica but has horrible R medial thigh pain.  Injection three weeks ago with less pain in R leg.  Three weeks since injection.  No pain medication; taking Tylenol.    [employer] and [employer]; next week doing external electrostimulator implant trial; then will place permanent; did not want to operate.    Buys Januvia is off plan; will always need a written prescription.  Has been maintained on several abx for poor tooth.    Passed kidney stone one month ago; had bleeding with it; had orange capsule.  Also starting with trouble with tooth; saw dental surgeon this morning.     Review of Systems  Constitutional: Negative for fever, chills, diaphoresis, activity change, appetite change and fatigue.  Respiratory: Negative for cough and shortness of breath.   Cardiovascular: Negative for chest pain, palpitations and leg swelling.  Gastrointestinal: Negative for nausea, vomiting, abdominal pain and diarrhea.  Endocrine: Negative for cold intolerance, heat intolerance, polydipsia, polyphagia and polyuria.  Skin: Negative for color change, rash and wound.  Neurological: Negative  for dizziness, tremors, seizures, syncope, facial asymmetry, speech difficulty, weakness, light-headedness, numbness and headaches.  Psychiatric/Behavioral: Negative for sleep disturbance and dysphoric mood. The patient is not nervous/anxious.     Past Medical History  Diagnosis Date  . Complication of anesthesia   . Coronary artery disease   . Hypertension   . Diabetes mellitus without complication ([place])   . Chronic kidney disease     Hx of stones  . Headache(784.0)     migraines  . Cancer ([place])     skin ca  . Arthritis   . Spinal stenosis   . Diverticulitis   . Glaucoma   . Ventricular dilatation     Right  . Hyperlipemia   . Atherosclerotic heart disease native coronary artery w/angina pectoris [facility])    Past Surgical History  Procedure Laterality Date  . Cardiac catheterization    . Coronary angioplasty    . Lithotripsy    . Colonoscopy N/A 06/15/2013    Procedure: COLONOSCOPY;  Surgeon: Beryle Beams, MD;  Location: [facility];  Service: Endoscopy;  Laterality: N/A;  . Cholecystectomy  1990  . Appendectomy  1991  . Bowel resection  1985   Allergies  Allergen Reactions  . Amaryl Other (See Comments)    Gives pt migraines  . Skelaxin [Metaxalone] Rash    Cherry red rash with sloughing off of skin at left upper chest/shoulder  . Hydrocodone     Does not have much affect  . Metformin And Related Diarrhea  . Levofloxacin Nausea Only  .  Lisinopril Other (See Comments)    cough   Current Outpatient Prescriptions  Medication Sig Dispense Refill  . acetaminophen (TYLENOL) 650 MG CR tablet Take 650 mg by mouth 2 (two) times daily.    Marland Kitchen amLODipine (NORVASC) 10 MG tablet Take 1 tablet daily 90 tablet 3  . aspirin 81 MG tablet Take 81 mg by mouth at bedtime.     Marland Kitchen atorvastatin (LIPITOR) 80 MG tablet TAKE 1 TABLET DAILY 90 tablet 3  . bimatoprost (LUMIGAN) 0.03 % ophthalmic solution Place 1 drop into both eyes at bedtime. Reported on 01/01/2016    . brimonidine-timolol  (COMBIGAN) 0.2-0.5 % ophthalmic solution Place 1 drop into both eyes 2 (two) times daily.    . chlorthalidone (HYGROTON) 25 MG tablet Take 1 tablet (25 mg total) by mouth daily. 90 tablet 3  . Cholecalciferol (VITAMIN D-3 PO) Take 5,000 Units by mouth 2 (two) times a week.     . dorzolamide (TRUSOPT) 2 % ophthalmic solution INSTILL 1 DROP INTO BOTH EYES TWICE DAILY  0  . fish oil-omega-3 fatty acids 1000 MG capsule Take 2 g by mouth daily.    Marland Kitchen gemfibrozil (LOPID) 600 MG tablet TAKE 1 TABLET DAILY. 90 tablet 3  . metFORMIN (GLUCOPHAGE-XR) 500 MG 24 hr tablet Take 1 tablet (500 mg total) by mouth daily with breakfast. 90 tablet 3  . multivitamin-lutein (OCUVITE-LUTEIN) CAPS capsule Take 1 capsule by mouth 2 (two) times daily.    Marland Kitchen OVER THE COUNTER MEDICATION Apply 1 application topically 2 (two) times daily. Doan's cream- Numbing cream    . sitaGLIPtin (JANUVIA) 100 MG tablet Take 1 tablet (100 mg total) by mouth daily. (Patient taking differently: Take 100 mg by mouth at bedtime. ) 90 tablet 3  . SUMAtriptan (IMITREX) 50 MG tablet Take 1 tablet (50 mg total) by mouth every 2 (two) hours as needed for migraine. 9 tablet 3  . cephALEXin (KEFLEX) 500 MG capsule Take 1 capsule (500 mg total) by mouth 2 (two) times daily. (Patient not taking: Reported on 02/13/2016) 10 capsule 0  . propranolol ER (INDERAL LA) 60 MG 24 hr capsule Take 1 capsule (60 mg total) by mouth 2 (two) times daily. (Patient not taking: Reported on 02/13/2016) 180 capsule 3   No current facility-administered medications for this visit.   Social History   Social History  . Marital Status: Married    Spouse Name: N/A  . Number of Children: 0  . Years of Education: Masters +   Occupational History  . Retired    Social History Main Topics  . Smoking status: Never Smoker   . Smokeless tobacco: Never Used  . Alcohol Use: 0.0 oz/week    0 Standard drinks or equivalent per week     Comment: ocas  . Drug Use: No  . Sexual  Activity: Not on file   Other Topics Concern  . Not on file   Social History Narrative   2 cups of coffee a day, soda or tea about 3 times a week.    Family History  Problem Relation Age of Onset  . Heart disease Father   . Heart attack Father   . Heart disease Paternal Grandmother        Objective:    BP 118/64 mmHg  Pulse 89  Temp(Src) 98.3 F (36.8 C)  Resp 18  Ht 6' (1.829 m)  Wt 208 lb (94.348 kg)  BMI 28.20 kg/m2  SpO2 97% Physical Exam  Constitutional: He is oriented  to person, place, and time. He appears well-developed and well-nourished. No distress.  HENT:  Head: Normocephalic and atraumatic.  Right Ear: External ear normal.  Left Ear: External ear normal.  Nose: Nose normal.  Mouth/Throat: Oropharynx is clear and moist.  Eyes: Conjunctivae and EOM are normal. Pupils are equal, round, and reactive to light.  Neck: Normal range of motion. Neck supple. Carotid bruit is not present. No thyromegaly present.  Cardiovascular: Normal rate, regular rhythm, normal heart sounds and intact distal pulses.  Exam reveals no gallop and no friction rub.   No murmur heard. Pulmonary/Chest: Effort normal and breath sounds normal. He has no wheezes. He has no rales.  Abdominal: Soft. Bowel sounds are normal. He exhibits no distension and no mass. There is no tenderness. There is no rebound and no guarding.  Lymphadenopathy:    He has no cervical adenopathy.  Neurological: He is alert and oriented to person, place, and time. No cranial nerve deficit.  Skin: Skin is warm and dry. No rash noted. He is not diaphoretic.  Psychiatric: He has a normal mood and affect. His behavior is normal.  Nursing note and vitals reviewed.       Assessment & Plan:   1. Functional diarrhea   2. Type 2 diabetes mellitus without complication, without long-term current use of insulin ([place])   3. Degenerative disc disease, lumbar   4. Encopresis    -STOP Metformin. -if persistent diarrhea,  refer to GI. -consider stool studies.   Orders Placed This Encounter  Procedures  . Comprehensive metabolic panel  . POCT CBC   No orders of the defined types were placed in this encounter.    No Follow-up on file.    Kristi Elayne Guerin, M.D. [facility] [address] [place], [place]  [phone] phone [phone] fax

## 2016-02-13 NOTE — Patient Instructions (Addendum)
       IF you received an x-ray today, you will receive an invoice from [facility]. Please contact [facility] at [phone] with questions or concerns regarding your invoice.   IF you received labwork today, you will receive an invoice from [facility]. Please contact [facility] at [phone] with questions or concerns regarding your invoice.   Our billing staff will not be able to assist you with questions regarding bills from these companies.  You will be contacted with the lab results as soon as they are available. The fastest way to get your results is to activate your My Chart account. Instructions are located on the last page of this paperwork. If you have not heard from us regarding the results in 2 weeks, please contact this office.     Diarrhea Diarrhea is frequent loose and watery bowel movements. It can cause you to feel weak and dehydrated. Dehydration can cause you to become tired and thirsty, have a dry mouth, and have decreased urination that often is dark yellow. Diarrhea is a sign of another problem, most often an infection that will not last long. In most cases, diarrhea typically lasts 2-3 days. However, it can last longer if it is a sign of something more serious. It is important to treat your diarrhea as directed by your caregiver to lessen or prevent future episodes of diarrhea. CAUSES  Some common causes include:  Gastrointestinal infections caused by viruses, bacteria, or parasites.  Food poisoning or food allergies.  Certain medicines, such as antibiotics, chemotherapy, and laxatives.  Artificial sweeteners and fructose.  Digestive disorders. HOME CARE INSTRUCTIONS  Ensure adequate fluid intake (hydration): Have 1 cup (8 oz) of fluid for each diarrhea episode. Avoid fluids that contain simple sugars or sports drinks, fruit juices, whole milk products, and sodas. Your urine should be clear or pale yellow if you  are drinking enough fluids. Hydrate with an oral rehydration solution that you can purchase at pharmacies, retail stores, and online. You can prepare an oral rehydration solution at home by mixing the following ingredients together:   - tsp table salt.   tsp baking soda.   tsp salt substitute containing potassium chloride.  1  tablespoons sugar.  1 L (34 oz) of water.  Certain foods and beverages may increase the speed at which food moves through the gastrointestinal (GI) tract. These foods and beverages should be avoided and include:  Caffeinated and alcoholic beverages.  High-fiber foods, such as raw fruits and vegetables, nuts, seeds, and whole grain breads and cereals.  Foods and beverages sweetened with sugar alcohols, such as xylitol, sorbitol, and mannitol.  Some foods may be well tolerated and may help thicken stool including:  Starchy foods, such as rice, toast, pasta, low-sugar cereal, oatmeal, grits, baked potatoes, crackers, and bagels.  Bananas.  Applesauce.  Add probiotic-rich foods to help increase healthy bacteria in the GI tract, such as yogurt and fermented milk products.  Wash your hands well after each diarrhea episode.  Only take over-the-counter or prescription medicines as directed by your caregiver.  Take a warm bath to relieve any burning or pain from frequent diarrhea episodes. SEEK IMMEDIATE MEDICAL CARE IF:   You are unable to keep fluids down.  You have persistent vomiting.  You have blood in your stool, or your stools are black and tarry.  You do not urinate in 6-8 hours, or there is only a small amount of very dark urine.  You have abdominal   pain that increases or localizes.  You have weakness, dizziness, confusion, or light-headedness.  You have a severe headache.  Your diarrhea gets worse or does not get better.  You have a fever or persistent symptoms for more than 2-3 days.  You have a fever and your symptoms suddenly get  worse. MAKE SURE YOU:   Understand these instructions.  Will watch your condition.  Will get help right away if you are not doing well or get worse.   This information is not intended to replace advice given to you by your health care provider. Make sure you discuss any questions you have with your health care provider.   Document Released: 10/23/2002 Document Revised: 11/23/2014 Document Reviewed: 07/10/2012 Elsevier Interactive Patient Education 2016 Elsevier Inc.  

## 2016-02-14 DIAGNOSIS — M5136 Other intervertebral disc degeneration, lumbar region: Secondary | ICD-10-CM | POA: Insufficient documentation

## 2016-02-14 DIAGNOSIS — E119 Type 2 diabetes mellitus without complications: Secondary | ICD-10-CM | POA: Insufficient documentation

## 2016-02-14 LAB — COMPREHENSIVE METABOLIC PANEL
ALT: 25 U/L (ref 9–46)
AST: 22 U/L (ref 10–35)
Albumin: 4.5 g/dL (ref 3.6–5.1)
Alkaline Phosphatase: 74 U/L (ref 40–115)
BUN: 17 mg/dL (ref 7–25)
CHLORIDE: 104 mmol/L (ref 98–110)
CO2: 27 mmol/L (ref 20–31)
CREATININE: 0.8 mg/dL (ref 0.70–1.25)
Calcium: 10.2 mg/dL (ref 8.6–10.3)
GLUCOSE: 118 mg/dL — AB (ref 65–99)
Potassium: 3.7 mmol/L (ref 3.5–5.3)
SODIUM: 143 mmol/L (ref 135–146)
TOTAL PROTEIN: 6.4 g/dL (ref 6.1–8.1)
Total Bilirubin: 0.6 mg/dL (ref 0.2–1.2)

## 2016-02-20 DIAGNOSIS — M4806 Spinal stenosis, lumbar region: Secondary | ICD-10-CM | POA: Diagnosis not present

## 2016-02-20 DIAGNOSIS — M5136 Other intervertebral disc degeneration, lumbar region: Secondary | ICD-10-CM | POA: Diagnosis not present

## 2016-02-22 ENCOUNTER — Encounter: Payer: Self-pay | Admitting: Family Medicine

## 2016-02-27 ENCOUNTER — Other Ambulatory Visit: Payer: Self-pay | Admitting: Anesthesiology

## 2016-02-27 DIAGNOSIS — M5136 Other intervertebral disc degeneration, lumbar region: Secondary | ICD-10-CM | POA: Diagnosis not present

## 2016-02-27 DIAGNOSIS — M546 Pain in thoracic spine: Secondary | ICD-10-CM | POA: Diagnosis not present

## 2016-02-27 DIAGNOSIS — M4806 Spinal stenosis, lumbar region: Secondary | ICD-10-CM | POA: Diagnosis not present

## 2016-03-11 ENCOUNTER — Other Ambulatory Visit (HOSPITAL_COMMUNITY): Payer: Self-pay | Admitting: *Deleted

## 2016-03-12 ENCOUNTER — Encounter (HOSPITAL_COMMUNITY)
Admission: RE | Admit: 2016-03-12 | Discharge: 2016-03-12 | Disposition: A | Discharge status: Home / Self Care | Payer: Medicare Other | Source: Ambulatory Visit | Attending: Anesthesiology | Admitting: Anesthesiology

## 2016-03-12 ENCOUNTER — Encounter (HOSPITAL_COMMUNITY): Payer: Self-pay

## 2016-03-12 DIAGNOSIS — Z955 Presence of coronary angioplasty implant and graft: Secondary | ICD-10-CM | POA: Insufficient documentation

## 2016-03-12 DIAGNOSIS — E785 Hyperlipidemia, unspecified: Secondary | ICD-10-CM | POA: Insufficient documentation

## 2016-03-12 DIAGNOSIS — M5136 Other intervertebral disc degeneration, lumbar region: Secondary | ICD-10-CM | POA: Insufficient documentation

## 2016-03-12 DIAGNOSIS — Z7982 Long term (current) use of aspirin: Secondary | ICD-10-CM | POA: Diagnosis not present

## 2016-03-12 DIAGNOSIS — Z79899 Other long term (current) drug therapy: Secondary | ICD-10-CM | POA: Diagnosis not present

## 2016-03-12 DIAGNOSIS — Z01812 Encounter for preprocedural laboratory examination: Secondary | ICD-10-CM | POA: Insufficient documentation

## 2016-03-12 DIAGNOSIS — Z01818 Encounter for other preprocedural examination: Secondary | ICD-10-CM | POA: Diagnosis not present

## 2016-03-12 DIAGNOSIS — G4733 Obstructive sleep apnea (adult) (pediatric): Secondary | ICD-10-CM | POA: Insufficient documentation

## 2016-03-12 DIAGNOSIS — E119 Type 2 diabetes mellitus without complications: Secondary | ICD-10-CM | POA: Insufficient documentation

## 2016-03-12 DIAGNOSIS — Z7984 Long term (current) use of oral hypoglycemic drugs: Secondary | ICD-10-CM | POA: Insufficient documentation

## 2016-03-12 DIAGNOSIS — I2510 Atherosclerotic heart disease of native coronary artery without angina pectoris: Secondary | ICD-10-CM | POA: Diagnosis not present

## 2016-03-12 DIAGNOSIS — I10 Essential (primary) hypertension: Secondary | ICD-10-CM | POA: Diagnosis not present

## 2016-03-12 HISTORY — DX: Sleep apnea, unspecified: G47.30

## 2016-03-12 HISTORY — DX: Failed or difficult intubation, initial encounter: T88.4XXA

## 2016-03-12 LAB — CBC
HEMATOCRIT: 42.3 % (ref 39.0–52.0)
HEMOGLOBIN: 14.4 g/dL (ref 13.0–17.0)
MCH: 31.6 pg (ref 26.0–34.0)
MCHC: 34 g/dL (ref 30.0–36.0)
MCV: 92.8 fL (ref 78.0–100.0)
Platelets: 207 10*3/uL (ref 150–400)
RBC: 4.56 MIL/uL (ref 4.22–5.81)
RDW: 12.9 % (ref 11.5–15.5)
WBC: 6.5 10*3/uL (ref 4.0–10.5)

## 2016-03-12 LAB — BASIC METABOLIC PANEL
ANION GAP: 10 (ref 5–15)
BUN: 15 mg/dL (ref 6–20)
CHLORIDE: 106 mmol/L (ref 101–111)
CO2: 27 mmol/L (ref 22–32)
Calcium: 10.7 mg/dL — ABNORMAL HIGH (ref 8.9–10.3)
Creatinine, Ser: 1 mg/dL (ref 0.61–1.24)
GFR calc non Af Amer: >60 mL/min (ref >60)
Glucose, Bld: 141 mg/dL — ABNORMAL HIGH (ref 65–99)
Potassium: 3.5 mmol/L (ref 3.5–5.1)
Sodium: 143 mmol/L (ref 135–145)

## 2016-03-12 LAB — GLUCOSE, CAPILLARY: Glucose-Capillary: 166 mg/dL — ABNORMAL HIGH (ref 65–99)

## 2016-03-12 LAB — APTT: aPTT: 27 seconds (ref 24–37)

## 2016-03-12 LAB — PROTIME-INR
INR: 1.06 (ref 0.00–1.49)
Prothrombin Time: 14 seconds (ref 11.6–15.2)

## 2016-03-12 LAB — SURGICAL PCR SCREEN
MRSA, PCR: NEGATIVE
Staphylococcus aureus: NEGATIVE

## 2016-03-12 NOTE — Progress Notes (Signed)
Call to Dr. Marcie Bal to report pt. H/o of diff. Intubation. Will fax request to [facility]. For anesth. Record fr. 2001. Will also fax request to Dr. Einar Gip for last echo, last OV note & EKG. Pt. Followed by Dr. Zannie Cove, [place] [facility].  Pt. Has had one cardiac stent placed in 2006.

## 2016-03-12 NOTE — Pre-Procedure Instructions (Signed)
[name]  03/12/2016      [facility] - [address], [place] [address] Phone: [phone] Fax: [phone]  [facility], [place] [address] [place] [address] Phone: [phone] Fax: [phone]  [id number] [address], [place] [address] Phone: [phone] Fax: [place] [facility] - [place], [place] - [address] [place] [address] Phone: [phone] Fax: [phone]    Your procedure is scheduled on 03/20/2016.  Report to [facility] Admitting at 7:45 A.M.  Call this number if you have problems the morning of surgery:  [phone]   Remember:  Do not eat food or drink liquids after midnight.  On Thursday   Take these medicines the morning of surgery with A SIP OF WATER : METOPROLOL, use morning eye drops    Do not wear jewelry   Do not wear lotions, powders, or perfumes.  You may wear deodorant.     Men may shave face and neck.   Do not bring valuables to the hospital.   [facility] is not responsible for any belongings or valuables.  Contacts, dentures or bridgework may not be worn into surgery.  Leave your suitcase in the car.  After surgery it may be brought to your room.  For patients admitted to the hospital, discharge time will be determined by your treatment team.  Patients discharged the day of surgery will not be allowed to drive home.   Name and phone number of your driver:   With spouse   Special instructions:   How to Manage Your Diabetes Before and After Surgery  Why is it important to control my blood sugar before and after surgery? . Improving blood sugar levels before and after surgery helps healing and can limit problems. . A way of improving blood sugar control  is eating a healthy diet by: o  Eating less sugar and carbohydrates o  Increasing activity/exercise o  Talking with your doctor about reaching your blood sugar goals . High blood sugars (greater than 180 mg/dL) can raise your risk of infections and slow your recovery, so you will need to focus on controlling your diabetes during the weeks before surgery. . Make sure that the doctor who takes care of your diabetes knows about your planned surgery including the date and location.  How do I manage my blood sugar before surgery? . Check your blood sugar at least 4 times a day, starting 2 days before surgery, to make sure that the level is not too high or low. o Check your blood sugar the morning of your surgery when you wake up and every 2 hours until you get to the Short Stay unit. . If your blood sugar is less than 70 mg/dL, you will need to treat for low blood sugar: o Do not take insulin. o Treat a low blood sugar (less than 70 mg/dL) with  cup of clear juice (cranberry or apple), 4 glucose tablets, OR glucose gel. o Recheck blood sugar in 15 minutes after treatment (to make sure it is greater than 70 mg/dL). If your blood sugar is not greater than 70 mg/dL on recheck, call [phone] for further instructions. . Report your blood sugar to the short stay nurse  when you get to Short Stay.  . If you are admitted to the hospital after surgery: o Your blood sugar will be checked by the staff and you will probably be given insulin after surgery (instead of oral diabetes medicines) to make sure you have good blood sugar levels. o The goal for blood sugar control after surgery is 80-180 mg/dL.              WHAT DO I DO ABOUT MY DIABETES MEDICATION?   Marland Kitchen Do not take oral diabetes medicines (pills) the morning of surgery.  . .        .   .   Other Instructions:          Patient Signature:  Date:   Nurse Signature:  Date:   Reviewed and Endorsed by [facility] Patient  Education Committee, August 2015      Special Instructions: [facility] - Preparing for Surgery  Before surgery, you can play an important role.  Because skin is not sterile, your skin needs to be as free of germs as possible.  You can reduce the number of germs on you skin by washing with CHG (chlorahexidine gluconate) soap before surgery.  CHG is an antiseptic cleaner which kills germs and bonds with the skin to continue killing germs even after washing.  Please DO NOT use if you have an allergy to CHG or antibacterial soaps.  If your skin becomes reddened/irritated stop using the CHG and inform your nurse when you arrive at Short Stay.  Do not shave (including legs and underarms) for at least 48 hours prior to the first CHG shower.  You may shave your face.  Please follow these instructions carefully:   1.  Shower with CHG Soap the night before surgery and the  morning of Surgery.  2.  If you choose to wash your hair, wash your hair first as usual with your  normal shampoo.  3.  After you shampoo, rinse your hair and body thoroughly to remove the  Shampoo.  4.  Use CHG as you would any other liquid soap.  You can apply chg directly to the skin and wash gently with scrungie or a clean washcloth.  5.  Apply the CHG Soap to your body ONLY FROM THE NECK DOWN.    Do not use on open wounds or open sores.  Avoid contact with your eyes, ears, mouth and genitals (private parts).  Wash genitals (private parts)   with your normal soap.  6.  Wash thoroughly, paying special attention to the area where your surgery will be performed.  7.  Thoroughly rinse your body with warm water from the neck down.  8.  DO NOT shower/wash with your normal soap after using and rinsing off   the CHG Soap.  9.  Pat yourself dry with a clean towel.            10.  Wear clean pajamas.            11.  Place clean sheets on your bed the night of your first shower and do not sleep with pets.  Day of Surgery  Do not apply  any lotions/deodorants the morning of surgery.  Please wear clean clothes to the hospital/surgery center.  Please read over the following fact sheets that you were given. Pain Booklet, Coughing and Deep Breathing, MRSA Information and Surgical Site Infection Prevention

## 2016-03-12 NOTE — Progress Notes (Signed)
Staph history in the past - treated /w mupirocin in the past, last time- one yr. Ago

## 2016-03-13 DIAGNOSIS — S63657D Sprain of metacarpophalangeal joint of left little finger, subsequent encounter: Secondary | ICD-10-CM | POA: Diagnosis not present

## 2016-03-13 NOTE — Progress Notes (Signed)
Anesthesia Chart Review:  Pt is a 70 year old male scheduled for lumbar spinal cord stimulator insertion on 03/20/2016 with Dr. Maryjean Ka.   Cardiologist is Dr. Einar Gip. PCP is Dr. Reginia Forts.   PMH includes:  CAD (DES to RCA 2006), HTN, DM, hyperilipidemia, OSA. Never smoker. BMI 29  Anesthesia history includes difficult intubation in 2001 at [facility] in [place]. Attempting to get records.   Medications include: amlodipine, ASA, lipitor, chlorthalidone, gemfibrozil, metoprolol, sitagliptin  Preoperative labs reviewed.   1 view CXR 01/06/16: Bilateral kidney stones. No ureteral or bladder stones are observed.  EKG 01/03/16: sinus bradycardia (57 bpm), Poor R wave progression, cannot exclude old anterior infarct. Inferolateral ischemia. Borderline low voltage complexes. No significant change from previous EKG 09/04/15 per Dr. Irven Shelling interpretation.   Echo 12/11/13:  1. LV cavity normal in size. Mild LVH. Mild septal notching. Normal global wall motion. Normal systolic global function. EF 58%. Grade I diastolic dysfunction 2. LA and RA moderately dilated 3. RV cavity mildly enlarged. Mild global hypokinesis 4. Mild calcification of mitral annulus. Mild MR 5. Mild TR. Mild pulmonary HTN  Exercise stress test 11/27/13:  - No evidence of ischemia.  - RV was prominent and suggests RVH or dilatation. Normal wall motion and endocardial thickening.  - LV EF 73%.  - Low risk study  Willeen Cass, FNP-BC [facility] Short Stay Surgical Center/Anesthesiology Phone: [phone] 03/13/2016 1:00 PM

## 2016-03-19 NOTE — H&P (Signed)
[name] is an 70 y.o. male.   Chief Complaint: lumbar and thoracic pain HPI: [name] has a long-standing history of cervical, thoracic and lumbar pain.  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 He has undergone epidural injections; he states the neck injection was the only injection that provided him with any benefit but after his trip to [facility] he had an increase in his pain.  He continues to complain of ongoing thoracic pain.  He states that this is been his oldest pain.  Cannot locate the area specifically.  Doing any work with his arms in front of him instigates his pain.  It does resolve or improve with rest.  He has no radiation .  It is a small localized spot.  He feels that is deep it is not a trigger point.  He is not tender to palpation there today.  No abnormality or step-off can be felt.  His imaging was unremarkable in his thoracic spine.  He also continues to complain of low back pain.  The lumbar injections so far have not been effective.  He continues to have radicular pains following the L2-3 dermatome.  He does not like to utilize any pain medications.  He does have tramadol, hydrocodone and oxycodone from previous prescriptions that they cause him too many side effects. He ultimately underwent an SCS trial with excellent results--better than 90% improvement-- in terms of his lumbar symptoms.  He had some exacerbation of his thoracic symptoms, which we will try to address during his permanent implantation.    Past Medical History  Diagnosis Date  . Coronary artery disease   . Hypertension   . Diabetes mellitus without complication ([place])   . Chronic kidney disease     Hx of stones  . Headache(784.0)     migraines  . Spinal stenosis   . Diverticulitis   . Glaucoma   . Ventricular dilatation     Right  . Hyperlipemia   . Atherosclerotic heart disease native coronary artery w/angina pectoris ([place])   . Complication of anesthesia    difficulty intubation , anterior larynx, easy mask ventilation  . Difficult intubation   . Sleep apnea     CPAP still in use q night, & for naps thru out the day, pt. reports that last study was neg for sleep apnea but he still uses the device    . Arthritis     DDD, scoliosis, stenosis    . Cancer ([place])     skin ca, posterior R ear    Past Surgical History  Procedure Laterality Date  . Lithotripsy    . Colonoscopy N/A 06/15/2013    Procedure: COLONOSCOPY;  Surgeon: Beryle Beams, MD;  Location: [facility];  Service: Endoscopy;  Laterality: N/A;  . Cholecystectomy  1990  . Appendectomy  1991  . Bowel resection  1985  . Cardiac catheterization  2006  . Coronary angioplasty    . Tonsillectomy    . Colon surgery      bowel resection, for diverticulitis     Family History  Problem Relation Age of Onset  . Heart disease Father   . Heart attack Father   . Heart disease Paternal Grandmother    Social History:  reports that he has never smoked. He has never used smokeless tobacco. He reports that he drinks alcohol. He reports that he does not use illicit drugs.  Allergies:  Allergies  Allergen Reactions  .  Skelaxin [Metaxalone] Rash    Cherry red rash with sloughing off of skin at left upper chest/shoulder  . Amaryl Other (See Comments)    Gives pt migraines  . Hydrocodone     Does not have much affect  . Metformin And Related Diarrhea  . Other Other (See Comments)    Pistachios and lima beans cause migraines  . Levofloxacin Nausea Only  . Lisinopril Other (See Comments)    cough    Medications Prior to Admission  Medication Sig Dispense Refill  . acetaminophen (TYLENOL) 650 MG CR tablet Take 650 mg by mouth 2 (two) times daily.    Marland Kitchen amLODipine (NORVASC) 10 MG tablet Take 1 tablet daily (Patient taking differently: Take 10 mg by mouth daily after supper. ) 90 tablet 3  . aspirin 81 MG tablet Take 81 mg by mouth at bedtime.     Marland Kitchen atorvastatin (LIPITOR) 80 MG tablet  TAKE 1 TABLET DAILY (Patient taking differently: Take 80 mg by mouth daily at 6 PM. ) 90 tablet 3  . bimatoprost (LUMIGAN) 0.03 % ophthalmic solution Place 1 drop into both eyes at bedtime. Reported on 01/01/2016    . brimonidine-timolol (COMBIGAN) 0.2-0.5 % ophthalmic solution Place 1 drop into both eyes 2 (two) times daily.    . chlorthalidone (HYGROTON) 25 MG tablet Take 1 tablet (25 mg total) by mouth daily. 90 tablet 3  . Cholecalciferol (VITAMIN D-3 PO) Take 5,000 Units by mouth 2 (two) times a week.     . dorzolamide (TRUSOPT) 2 % ophthalmic solution INSTILL 1 DROP INTO BOTH EYES TWICE DAILY  0  . fish oil-omega-3 fatty acids 1000 MG capsule Take 2 g by mouth daily.    Marland Kitchen gemfibrozil (LOPID) 600 MG tablet TAKE 1 TABLET DAILY. (Patient taking differently: Take 600 mg by mouth daily. ) 90 tablet 3  . metoprolol tartrate (LOPRESSOR) 25 MG tablet Take 25 mg by mouth 2 (two) times daily.    . Multiple Vitamins-Minerals (PRESERVISION AREDS 2) CAPS Take 1 capsule by mouth 2 (two) times daily.    Marland Kitchen OVER THE COUNTER MEDICATION Apply 1 application topically 2 (two) times daily. Reported on 03/12/2016    . sitaGLIPtin (JANUVIA) 100 MG tablet Take 1 tablet (100 mg total) by mouth daily. (Patient taking differently: Take 100 mg by mouth at bedtime. ) 90 tablet 3  . SUMAtriptan (IMITREX) 50 MG tablet Take 1 tablet (50 mg total) by mouth every 2 (two) hours as needed for migraine. 9 tablet 3    Results for orders placed or performed during the hospital encounter of 03/20/16 (from the past 48 hour(s))  Glucose, capillary     Status: Abnormal   Collection Time: 03/20/16  8:09 AM  Result Value Ref Range   Glucose-Capillary 137 (H) 65 - 99 mg/dL   No results found.  Review of Systems  Constitutional: Negative.   Eyes: Negative.   Respiratory: Negative.   Cardiovascular: Negative.   Gastrointestinal: Negative.   Genitourinary: Negative.   Musculoskeletal: Positive for back pain and neck pain. Negative  for myalgias, joint pain and falls.  Skin: Negative.   Neurological: Negative.   Endo/Heme/Allergies: Negative.   Psychiatric/Behavioral: Negative.     Blood pressure 114/62, pulse 57, temperature 98.1 F (36.7 C), temperature source Oral, resp. rate 20, height 6' (1.829 m), weight 95.255 kg (210 lb), SpO2 96 %. Physical Exam  Constitutional: He is oriented to person, place, and time. He appears well-developed and well-nourished.  HENT:  Head: Normocephalic  and atraumatic.  Eyes: EOM are normal. Pupils are equal, round, and reactive to light.  Neck: Normal range of motion.  Cardiovascular: Normal rate.   Musculoskeletal: Normal range of motion.  Neurological: He is alert and oriented to person, place, and time.  Skin: Skin is warm and dry.  Psychiatric: He has a normal mood and affect. His behavior is normal. Thought content normal.     Assessment/Plan 1) chronic pain syndrome 2) multilevel lumbar spondylosis and degenerative disease  PLAN:permanent SCS  Bonna Gains, MD 03/20/2016, 9:18 AM

## 2016-03-19 NOTE — Progress Notes (Signed)
Spoke with [facility] in [place], [place].  New fax number received. Request sent STAT for anesthesia records from 2001

## 2016-03-20 ENCOUNTER — Ambulatory Visit (HOSPITAL_COMMUNITY): Payer: Medicare Other

## 2016-03-20 ENCOUNTER — Ambulatory Visit (HOSPITAL_COMMUNITY): Payer: Medicare Other | Admitting: Certified Registered"

## 2016-03-20 ENCOUNTER — Encounter (HOSPITAL_COMMUNITY)
Admission: RE | Disposition: A | Discharge status: Home / Self Care | Payer: Self-pay | Source: Ambulatory Visit | Attending: Anesthesiology

## 2016-03-20 ENCOUNTER — Encounter (HOSPITAL_COMMUNITY): Payer: Self-pay | Admitting: Certified Registered"

## 2016-03-20 ENCOUNTER — Ambulatory Visit (HOSPITAL_COMMUNITY): Payer: Medicare Other | Admitting: Emergency Medicine

## 2016-03-20 ENCOUNTER — Ambulatory Visit (HOSPITAL_COMMUNITY)
Admission: RE | Admit: 2016-03-20 | Discharge: 2016-03-20 | Disposition: A | Discharge status: Home / Self Care | Payer: Medicare Other | Source: Ambulatory Visit | Attending: Anesthesiology | Admitting: Anesthesiology

## 2016-03-20 DIAGNOSIS — M5134 Other intervertebral disc degeneration, thoracic region: Secondary | ICD-10-CM | POA: Diagnosis not present

## 2016-03-20 DIAGNOSIS — Z462 Encounter for fitting and adjustment of other devices related to nervous system and special senses: Secondary | ICD-10-CM | POA: Diagnosis not present

## 2016-03-20 DIAGNOSIS — G894 Chronic pain syndrome: Secondary | ICD-10-CM | POA: Insufficient documentation

## 2016-03-20 DIAGNOSIS — E1122 Type 2 diabetes mellitus with diabetic chronic kidney disease: Secondary | ICD-10-CM | POA: Diagnosis not present

## 2016-03-20 DIAGNOSIS — E785 Hyperlipidemia, unspecified: Secondary | ICD-10-CM | POA: Diagnosis not present

## 2016-03-20 DIAGNOSIS — M1990 Unspecified osteoarthritis, unspecified site: Secondary | ICD-10-CM | POA: Diagnosis not present

## 2016-03-20 DIAGNOSIS — I2510 Atherosclerotic heart disease of native coronary artery without angina pectoris: Secondary | ICD-10-CM | POA: Insufficient documentation

## 2016-03-20 DIAGNOSIS — I129 Hypertensive chronic kidney disease with stage 1 through stage 4 chronic kidney disease, or unspecified chronic kidney disease: Secondary | ICD-10-CM | POA: Insufficient documentation

## 2016-03-20 DIAGNOSIS — M5136 Other intervertebral disc degeneration, lumbar region: Secondary | ICD-10-CM | POA: Diagnosis not present

## 2016-03-20 DIAGNOSIS — N189 Chronic kidney disease, unspecified: Secondary | ICD-10-CM | POA: Insufficient documentation

## 2016-03-20 DIAGNOSIS — Z7982 Long term (current) use of aspirin: Secondary | ICD-10-CM | POA: Diagnosis not present

## 2016-03-20 DIAGNOSIS — G8929 Other chronic pain: Secondary | ICD-10-CM | POA: Diagnosis present

## 2016-03-20 DIAGNOSIS — Z419 Encounter for procedure for purposes other than remedying health state, unspecified: Secondary | ICD-10-CM

## 2016-03-20 HISTORY — PX: SPINAL CORD STIMULATOR INSERTION: SHX5378

## 2016-03-20 LAB — GLUCOSE, CAPILLARY
Glucose-Capillary: 137 mg/dL — ABNORMAL HIGH (ref 65–99)
Glucose-Capillary: 154 mg/dL — ABNORMAL HIGH (ref 65–99)

## 2016-03-20 SURGERY — INSERTION, SPINAL CORD STIMULATOR, LUMBAR
Anesthesia: Monitor Anesthesia Care

## 2016-03-20 MED ORDER — OXYCODONE HCL 5 MG/5ML PO SOLN: 5.0000 mg | Freq: Once | ORAL | Status: DC | PRN

## 2016-03-20 MED ORDER — OXYCODONE-ACETAMINOPHEN 7.5-325 MG PO TABS: 1.0000 | ORAL_TABLET | ORAL | Status: DC | PRN

## 2016-03-20 MED ORDER — ONDANSETRON HCL 4 MG/2ML IJ SOLN: 4.0000 mg | Freq: Once | INTRAMUSCULAR | Status: DC | PRN

## 2016-03-20 MED ORDER — OXYCODONE HCL 5 MG PO TABS: 5.0000 mg | ORAL_TABLET | Freq: Once | ORAL | Status: DC | PRN

## 2016-03-20 MED ORDER — CLINDAMYCIN HCL 150 MG PO CAPS
150.0000 mg | ORAL_CAPSULE | Freq: Three times a day (TID) | ORAL | Status: DC
Start: 2016-03-20 — End: 2017-02-02

## 2016-03-20 MED ORDER — BUPIVACAINE-EPINEPHRINE (PF) 0.5% -1:200000 IJ SOLN
INTRAMUSCULAR | Status: DC | PRN
Start: 2016-03-20 — End: 2016-03-20
  Administered 2016-03-20: 30 mL via PERINEURAL

## 2016-03-20 MED ORDER — 0.9 % SODIUM CHLORIDE (POUR BTL) OPTIME
TOPICAL | Status: DC | PRN
  Administered 2016-03-20: 1000 mL

## 2016-03-20 MED ORDER — BACITRACIN-NEOMYCIN-POLYMYXIN OINTMENT TUBE
TOPICAL_OINTMENT | CUTANEOUS | Status: DC | PRN
  Administered 2016-03-20: 1 via TOPICAL

## 2016-03-20 MED ORDER — CEFAZOLIN SODIUM-DEXTROSE 2-4 GM/100ML-% IV SOLN
2.0000 g | INTRAVENOUS | Status: AC
  Administered 2016-03-20: 2 g via INTRAVENOUS

## 2016-03-20 MED ORDER — DEXMEDETOMIDINE HCL 200 MCG/2ML IV SOLN
INTRAVENOUS | Status: DC | PRN
  Administered 2016-03-20: 95 ug via INTRAVENOUS

## 2016-03-20 MED ORDER — MIDAZOLAM HCL 2 MG/2ML IJ SOLN
INTRAMUSCULAR | Status: AC
  Filled 2016-03-20: qty 2

## 2016-03-20 MED ORDER — LIDOCAINE HCL (CARDIAC) 20 MG/ML IV SOLN
INTRAVENOUS | Status: DC | PRN
  Administered 2016-03-20: 20 mg via INTRAVENOUS

## 2016-03-20 MED ORDER — FENTANYL CITRATE (PF) 100 MCG/2ML IJ SOLN
25.0000 ug | INTRAMUSCULAR | Status: DC | PRN
Start: 2016-03-20 — End: 2016-03-20

## 2016-03-20 MED ORDER — FENTANYL CITRATE (PF) 250 MCG/5ML IJ SOLN
INTRAMUSCULAR | Status: DC | PRN
  Administered 2016-03-20: 50 ug via INTRAVENOUS

## 2016-03-20 MED ORDER — LACTATED RINGERS IV SOLN
INTRAVENOUS | Status: DC | PRN
  Administered 2016-03-20: 10:00:00 via INTRAVENOUS

## 2016-03-20 MED ORDER — BACITRACIN 50000 UNITS IM SOLR
INTRAMUSCULAR | Status: DC | PRN
  Administered 2016-03-20: 10:00:00

## 2016-03-20 MED ORDER — LACTATED RINGERS IV SOLN
INTRAVENOUS | Status: DC
  Administered 2016-03-20: 08:00:00 via INTRAVENOUS

## 2016-03-20 MED ORDER — FENTANYL CITRATE (PF) 250 MCG/5ML IJ SOLN
INTRAMUSCULAR | Status: AC
Start: 2016-03-20 — End: 2016-03-20
  Filled 2016-03-20: qty 5

## 2016-03-20 MED ORDER — PROPOFOL 500 MG/50ML IV EMUL
INTRAVENOUS | Status: DC | PRN
Start: 2016-03-20 — End: 2016-03-20
  Administered 2016-03-20: 75 ug/kg/min via INTRAVENOUS

## 2016-03-20 MED ORDER — PROPOFOL 10 MG/ML IV BOLUS
INTRAVENOUS | Status: DC | PRN
  Administered 2016-03-20: 10 mg via INTRAVENOUS

## 2016-03-20 MED ORDER — MIDAZOLAM HCL 5 MG/5ML IJ SOLN
INTRAMUSCULAR | Status: DC | PRN
  Administered 2016-03-20: 1 mg via INTRAVENOUS

## 2016-03-20 MED ORDER — CEFAZOLIN SODIUM-DEXTROSE 2-4 GM/100ML-% IV SOLN
INTRAVENOUS | Status: AC
  Filled 2016-03-20: qty 100

## 2016-03-20 SURGICAL SUPPLY — 62 items
ANCHOR CLIK X NEURO (Stimulator) ×2 IMPLANT
BAG DECANTER FOR FLEXI CONT (MISCELLANEOUS) ×2 IMPLANT
BENZOIN TINCTURE PRP APPL 2/3 (GAUZE/BANDAGES/DRESSINGS) IMPLANT
BINDER ABDOMINAL 12 ML 46-62 (SOFTGOODS) ×2 IMPLANT
BLADE CLIPPER SURG (BLADE) IMPLANT
CABLE/EXTENSION OR 1X16 61 (CABLE) ×4 IMPLANT
CHLORAPREP W/TINT 26ML (MISCELLANEOUS) ×2 IMPLANT
CLIP TI WIDE RED SMALL 6 (CLIP) IMPLANT
DRAPE C-ARM 42X72 X-RAY (DRAPES) ×2 IMPLANT
DRAPE C-ARMOR (DRAPES) ×2 IMPLANT
DRAPE LAPAROTOMY 100X72X124 (DRAPES) ×2 IMPLANT
DRAPE POUCH INSTRU U-SHP 10X18 (DRAPES) ×2 IMPLANT
DRAPE SURG 17X23 STRL (DRAPES) ×2 IMPLANT
DRSG OPSITE POSTOP 3X4 (GAUZE/BANDAGES/DRESSINGS) ×2 IMPLANT
DRSG OPSITE POSTOP 4X6 (GAUZE/BANDAGES/DRESSINGS) ×2 IMPLANT
ELECT REM PT RETURN 9FT ADLT (ELECTROSURGICAL) ×2
ELECTRODE REM PT RTRN 9FT ADLT (ELECTROSURGICAL) ×1 IMPLANT
GAUZE SPONGE 4X4 16PLY XRAY LF (GAUZE/BANDAGES/DRESSINGS) ×2 IMPLANT
GLOVE BIOGEL PI IND STRL 7.5 (GLOVE) ×2 IMPLANT
GLOVE BIOGEL PI INDICATOR 7.5 (GLOVE) ×2
GLOVE ECLIPSE 7.5 STRL STRAW (GLOVE) ×8 IMPLANT
GLOVE EXAM NITRILE LRG STRL (GLOVE) IMPLANT
GLOVE EXAM NITRILE MD LF STRL (GLOVE) IMPLANT
GLOVE EXAM NITRILE XL STR (GLOVE) IMPLANT
GLOVE EXAM NITRILE XS STR PU (GLOVE) IMPLANT
GLOVE INDICATOR 8.0 STRL GRN (GLOVE) ×2 IMPLANT
GOWN STRL REUS W/ TWL LRG LVL3 (GOWN DISPOSABLE) ×1 IMPLANT
GOWN STRL REUS W/ TWL XL LVL3 (GOWN DISPOSABLE) IMPLANT
GOWN STRL REUS W/TWL 2XL LVL3 (GOWN DISPOSABLE) ×2 IMPLANT
GOWN STRL REUS W/TWL LRG LVL3 (GOWN DISPOSABLE) ×1
GOWN STRL REUS W/TWL XL LVL3 (GOWN DISPOSABLE)
IPG PRECISION SPECTRA (Stimulator) ×2 IMPLANT
KIT BASIN OR (CUSTOM PROCEDURE TRAY) ×2 IMPLANT
KIT CHARGING (KITS) ×1
KIT CHARGING PRECISION NEURO (KITS) ×1 IMPLANT
KIT PAT PROGRAM FREELINK (KITS) ×1 IMPLANT
KIT ROOM TURNOVER OR (KITS) ×2 IMPLANT
KIT SPLITTER 30CM 2X8 (Stimulator) ×4 IMPLANT
LEAD KIT CONTACT INFINION 16 (Stimulator) ×4 IMPLANT
LIQUID BAND (GAUZE/BANDAGES/DRESSINGS) ×2 IMPLANT
NEEDLE 18GX1X1/2 (RX/OR ONLY) (NEEDLE) IMPLANT
NEEDLE HYPO 25X1 1.5 SAFETY (NEEDLE) ×2 IMPLANT
NS IRRIG 1000ML POUR BTL (IV SOLUTION) ×2 IMPLANT
PACK LAMINECTOMY NEURO (CUSTOM PROCEDURE TRAY) ×2 IMPLANT
PAD ARMBOARD 7.5X6 YLW CONV (MISCELLANEOUS) ×2 IMPLANT
REMOTE CONTROL KIT (KITS) ×2
SPONGE LAP 4X18 X RAY DECT (DISPOSABLE) ×2 IMPLANT
SPONGE SURGIFOAM ABS GEL SZ50 (HEMOSTASIS) IMPLANT
STAPLER SKIN PROX WIDE 3.9 (STAPLE) ×2 IMPLANT
STRIP CLOSURE SKIN 1/2X4 (GAUZE/BANDAGES/DRESSINGS) IMPLANT
SUT MNCRL AB 4-0 PS2 18 (SUTURE) ×2 IMPLANT
SUT SILK 0 (SUTURE) ×1
SUT SILK 0 MO-6 18XCR BRD 8 (SUTURE) ×1 IMPLANT
SUT SILK 0 TIES 10X30 (SUTURE) IMPLANT
SUT SILK 2 0 TIES 10X30 (SUTURE) IMPLANT
SUT VIC AB 2-0 CP2 18 (SUTURE) ×6 IMPLANT
SYR EPIDURAL 5ML GLASS (SYRINGE) ×4 IMPLANT
SYRINGE 10CC LL (SYRINGE) ×2 IMPLANT
TOWEL OR 17X24 6PK STRL BLUE (TOWEL DISPOSABLE) ×2 IMPLANT
TOWEL OR 17X26 10 PK STRL BLUE (TOWEL DISPOSABLE) ×2 IMPLANT
WATER STERILE IRR 1000ML POUR (IV SOLUTION) ×2 IMPLANT
YANKAUER SUCT BULB TIP NO VENT (SUCTIONS) ×2 IMPLANT

## 2016-03-20 NOTE — Transfer of Care (Signed)
Immediate Anesthesia Transfer of Care Note  Patient: [name]  Procedure(s) Performed: Procedure(s): LUMBAR SPINAL CORD STIMULATOR INSERTION (N/A)  Patient Location: PACU  Anesthesia Type:MAC  Level of Consciousness: awake, alert , oriented and sedated  Airway & Oxygen Therapy: Patient Spontanous Breathing and Patient connected to nasal cannula oxygen  Post-op Assessment: Report given to RN, Post -op Vital signs reviewed and stable and Patient moving all extremities X 4  Post vital signs: Reviewed and stable  Last Vitals:  Filed Vitals:   03/20/16 0806  BP: 114/62  Pulse: 57  Temp: 36.7 C  Resp: 20    Last Pain:  Filed Vitals:   03/20/16 1137  PainSc: 3       Patients Stated Pain Goal: 2 ([id number])  Complications: No apparent anesthesia complications

## 2016-03-20 NOTE — Anesthesia Preprocedure Evaluation (Signed)
Anesthesia Evaluation  Patient identified by MRN, date of birth, ID band Patient awake    Reviewed: Allergy & Precautions, NPO status , Patient's Chart, lab work & pertinent test results  Airway Mallampati: II  TM Distance: >3 FB Neck ROM: Full    Dental  (+) Teeth Intact, Dental Advisory Given, Loose,    Pulmonary    breath sounds clear to auscultation       Cardiovascular hypertension,  Rhythm:Regular Rate:Normal     Neuro/Psych    GI/Hepatic   Endo/Other  diabetes  Renal/GU      Musculoskeletal   Abdominal   Peds  Hematology   Anesthesia Other Findings   Reproductive/Obstetrics                             Anesthesia Physical Anesthesia Plan  ASA: III  Anesthesia Plan: MAC   Post-op Pain Management:    Induction: Intravenous  Airway Management Planned: Natural Airway and Simple Face Mask  Additional Equipment:   Intra-op Plan:   Post-operative Plan:   Informed Consent: I have reviewed the patients History and Physical, chart, labs and discussed the procedure including the risks, benefits and alternatives for the proposed anesthesia with the patient or authorized representative who has indicated his/her understanding and acceptance.   Dental advisory given  Plan Discussed with: CRNA and Anesthesiologist  Anesthesia Plan Comments: (H/O difficult intubation in 2001  CAD S/P DES 2006 Type 2 DM glucose 137 OSA on CPAP  Echo 12/11/13:  1. LV cavity normal in size. Mild LVH. Mild septal notching. Normal global wall motion. Normal systolic global function. EF 58%. Grade I diastolic dysfunction 2. LA and RA moderately dilated 3. RV cavity mildly enlarged. Mild global hypokinesis 4. Mild calcification of mitral annulus. Mild MR 5. Mild TR. Mild pulmonary HTN  Exercise stress test 11/27/13:  - No evidence of ischemia.  - RV was prominent and suggests RVH or dilatation.  Normal wall motion and endocardial thickening.  - LV EF 73%.  - Low risk study  Plan MAC have glide scope LMAs immediately available.  Roberts Gaudy )        Anesthesia Quick Evaluation

## 2016-03-20 NOTE — Anesthesia Postprocedure Evaluation (Signed)
Anesthesia Post Note  Patient: [name]  Procedure(s) Performed: Procedure(s) (LRB): LUMBAR SPINAL CORD STIMULATOR INSERTION (N/A)  Patient location during evaluation: PACU Anesthesia Type: MAC Level of consciousness: awake, awake and alert and oriented Pain management: pain level controlled Vital Signs Assessment: post-procedure vital signs reviewed and stable Respiratory status: spontaneous breathing, nonlabored ventilation and respiratory function stable Cardiovascular status: blood pressure returned to baseline Anesthetic complications: no    Last Vitals:  Filed Vitals:   03/20/16 1200 03/20/16 1230  BP:  95/60  Pulse: 57 63  Temp:    Resp: 13     Last Pain:  Filed Vitals:   03/20/16 1235  PainSc: Asleep                 Isa Kohlenberg COKER

## 2016-03-20 NOTE — Op Note (Signed)
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 The patient was taken to the OR, turned prone onto a Jackson table, all pressure points padded, SCD's placed, and an adequate plane of anesthesia induced. A timeout was taken to verify the correct patient, position, personnel, availability of appropriate equipment, and administration of perioperative antibiotics.  The thoracic and lumbar areas were widely prepped with chloraprep and draped into a sterile field. Fluoroscopy was used to plan a right paramedian incision at the T12-L2 levels, and an incision made with a 10 blade and carried down to the dorsolumbar fascia with the bovie and blunt dissection. Retractors were placed and a 14g [employer] tuohy needle placed into the epidural space at the T12-L1 interspace using biplanar fluoro and loss-of-resistance technique. The needle was aspirated without any return of fluid. A [employer] INFINION lead was introduced and under live AP fluoro advanced until the distal-most contact overlay the inferior aspect T6 vertebral body shadow with the rest of the contacts distributed over the T7 and T8 vertebral bodies in a position just right of anatomic midline. A second Infinion lead was placed left of anatomic midline in the same levels using the same technique. The patient was awakened and the leads tested; impedances were good, and the patient reported good coverage with amplitudes in the 3-7 mA range. With the leads  advanced more cephalad than with his trial, we were able to capture control of some of his thoracic pain as well.   0 silk sutures were placed in the fascia adjacent to the needles. The needles and stylets were removed under fluoroscopy with no lead migration noted. Leads were then fixed to the fascia by chest tube-type fixation into position with the sutures; repeat images were obtained to verify that there had been no lead migration.  The incision was inspected and hemostasis obtained with the bipolar cautery.  Attention was then turned to creation of a subcutaneous pocket. At the right flank, a 3 cm incision was made with a 10 blade and using the bovie and blunt dissection a pocket of size appropriate to place a SCS generator. The pocket was trialed, and found to be of adequate size. The pocket was inspected for hemostasis, which was found to be excellent. Using reverse seldinger technique, the leads were tunneled to the pocket site, and the leads inserted into the SCS generator. Impedances were checked, and all found to be excellent. The leads were then all fixed into position with a self-torquing wrench. The wiring was all carefully coiled, placed behind the generator and placed in the pocket.  Both incisions were copiously irrigated with bacitracin-containing irrigation. The lumbar incision was closed in 2 deep layers of interrupted 2-0 vicryl and the skin closed with staples. The pocket incision was closed with a deeper layer of 2-0 vicryl interrupted sutures, and the skin closed with staples. Sterile dressings were applied. Needle, sponge, and instrument counts were correct x2 at the end of the case.  The patient was then carefully awakened from anesthesia, turned supine, an abdominal binder placed, and the patient taken to the recovery room where he underwent  complex spinal cord stimulator programming.  COMPLICATIONS: NONE  CONDITION: Stable throughout the course of the procedure and  immediately afterward  DISPOSITION: discharge to home, with antibiotics and pain medicine. Discussed care with the patient and spouse. Followup in clinic will be scheduled in 10-14 days.

## 2016-03-21 LAB — HEMOGLOBIN A1C
Hgb A1c MFr Bld: 6.3 % — ABNORMAL HIGH (ref 4.8–5.6)
Mean Plasma Glucose: 134 mg/dL

## 2016-03-23 ENCOUNTER — Encounter (HOSPITAL_COMMUNITY): Payer: Self-pay | Admitting: Anesthesiology

## 2016-03-23 DIAGNOSIS — M5136 Other intervertebral disc degeneration, lumbar region: Secondary | ICD-10-CM | POA: Diagnosis not present

## 2016-03-23 DIAGNOSIS — M4806 Spinal stenosis, lumbar region: Secondary | ICD-10-CM | POA: Diagnosis not present

## 2016-03-23 DIAGNOSIS — M546 Pain in thoracic spine: Secondary | ICD-10-CM | POA: Diagnosis not present

## 2016-03-23 DIAGNOSIS — G894 Chronic pain syndrome: Secondary | ICD-10-CM | POA: Diagnosis not present

## 2016-03-26 ENCOUNTER — Ambulatory Visit (INDEPENDENT_AMBULATORY_CARE_PROVIDER_SITE_OTHER): Payer: Medicare Other | Admitting: Family Medicine

## 2016-03-26 VITALS — BP 143/76 | HR 73 | Temp 98.0°F | Resp 16 | Ht 72.0 in | Wt 214.0 lb

## 2016-03-26 DIAGNOSIS — L739 Follicular disorder, unspecified: Secondary | ICD-10-CM | POA: Diagnosis not present

## 2016-03-26 NOTE — Patient Instructions (Addendum)
     IF you received an x-ray today, you will receive an invoice from [facility]. Please contact [facility] at [phone] with questions or concerns regarding your invoice.   IF you received labwork today, you will receive an invoice from [employer]. Please contact [facility] at [phone] with questions or concerns regarding your invoice.   Our billing staff will not be able to assist you with questions regarding bills from these companies.  You will be contacted with the lab results as soon as they are available. The fastest way to get your results is to activate your My Chart account. Instructions are located on the last page of this paperwork. If you have not heard from [place] regarding the results in 2 weeks, please contact this office.     Folliculitis Folliculitis is redness, soreness, and swelling (inflammation) of the hair follicles. This condition can occur anywhere on the body. People with weakened immune systems, diabetes, or obesity have a greater risk of getting folliculitis. CAUSES  Bacterial infection. This is the most common cause.  Fungal infection.  Viral infection.  Contact with certain chemicals, especially oils and tars. Long-term folliculitis can result from bacteria that live in the nostrils. The bacteria may trigger multiple outbreaks of folliculitis over time. SYMPTOMS Folliculitis most commonly occurs on the scalp, thighs, legs, back, buttocks, and areas where hair is shaved frequently. An early sign of folliculitis is a small, white or yellow, pus-filled, itchy lesion (pustule). These lesions appear on a red, inflamed follicle. They are usually less than 0.2 inches (5 mm) wide. When there is an infection of the follicle that goes deeper, it becomes a boil or furuncle. A group of closely packed boils creates a larger lesion (carbuncle). Carbuncles tend to occur in hairy, sweaty areas of the body. DIAGNOSIS  Your  caregiver can usually tell what is wrong by doing a physical exam. A sample may be taken from one of the lesions and tested in a lab. This can help determine what is causing your folliculitis. TREATMENT  Treatment may include:  Applying warm compresses to the affected areas.  Taking antibiotic medicines orally or applying them to the skin.  Draining the lesions if they contain a large amount of pus or fluid.  Laser hair removal for cases of long-lasting folliculitis. This helps to prevent regrowth of the hair. HOME CARE INSTRUCTIONS  Apply warm compresses to the affected areas as directed by your caregiver.  If antibiotics are prescribed, take them as directed. Finish them even if you start to feel better.  You may take over-the-counter medicines to relieve itching.  Do not shave irritated skin.  Follow up with your caregiver as directed. SEEK IMMEDIATE MEDICAL CARE IF:   You have increasing redness, swelling, or pain in the affected area.  You have a fever. MAKE SURE YOU:  Understand these instructions.  Will watch your condition.  Will get help right away if you are not doing well or get worse.   This information is not intended to replace advice given to you by your health care provider. Make sure you discuss any questions you have with your health care provider.   Document Released: 01/11/2002 Document Revised: 11/23/2014 Document Reviewed: 02/02/2012 Elsevier Interactive Patient Education [employer].

## 2016-03-29 LAB — WOUND CULTURE
GRAM STAIN: NONE SEEN
GRAM STAIN: NONE SEEN
Gram Stain: NONE SEEN
Organism ID, Bacteria: NO GROWTH

## 2016-04-02 DIAGNOSIS — M546 Pain in thoracic spine: Secondary | ICD-10-CM | POA: Diagnosis not present

## 2016-04-02 DIAGNOSIS — M4806 Spinal stenosis, lumbar region: Secondary | ICD-10-CM | POA: Diagnosis not present

## 2016-04-02 DIAGNOSIS — M5136 Other intervertebral disc degeneration, lumbar region: Secondary | ICD-10-CM | POA: Diagnosis not present

## 2016-04-02 DIAGNOSIS — G894 Chronic pain syndrome: Secondary | ICD-10-CM | POA: Diagnosis not present

## 2016-04-10 DIAGNOSIS — S63657D Sprain of metacarpophalangeal joint of left little finger, subsequent encounter: Secondary | ICD-10-CM | POA: Diagnosis not present

## 2016-04-15 NOTE — Progress Notes (Signed)
Subjective:    Patient ID: [name], male    DOB: [birth year], 70 y.o.   MRN: [id number]  03/26/2016  Cellulitis   HPI This 70 y.o. male presents for evaluation of tender swollen area in genital area. Noticed in past five days.  S/p surgical implantation of stimulator recently.  Slow to move and did not have formal bath for several days after surgery. Denies fever/chills/sweats.  +feels tender and swollen area in genital area; no drainage.  Becoming more tender. Hopeful that DDD lumbar with chronic pain will improve after post-operative period; trial of temporary stimulator promising.  Review of Systems  Constitutional: Negative for fever, chills, diaphoresis and fatigue.  Skin: Positive for color change and wound. Negative for pallor and rash.    Past Medical History  Diagnosis Date  . Coronary artery disease   . Hypertension   . Diabetes mellitus without complication ([place])   . Chronic kidney disease     Hx of stones  . Headache(784.0)     migraines  . Spinal stenosis   . Diverticulitis   . Glaucoma   . Ventricular dilatation     Right  . Hyperlipemia   . Atherosclerotic heart disease native coronary artery w/angina pectoris ([place])   . Complication of anesthesia     difficulty intubation , anterior larynx, easy mask ventilation  . Difficult intubation   . Sleep apnea     CPAP still in use q night, & for naps thru out the day, pt. reports that last study was neg for sleep apnea but he still uses the device    . Arthritis     DDD, scoliosis, stenosis    . Cancer ([place])     skin ca, posterior R ear   Past Surgical History  Procedure Laterality Date  . Lithotripsy    . Colonoscopy N/A 06/15/2013    Procedure: COLONOSCOPY;  Surgeon: Beryle Beams, MD;  Location: [facility];  Service: Endoscopy;  Laterality: N/A;  . Cholecystectomy  1990  . Appendectomy  1991  . Bowel resection  1985  . Cardiac catheterization  2006  . Coronary angioplasty    . Tonsillectomy     . Colon surgery      bowel resection, for diverticulitis   . Spinal cord stimulator insertion N/A 03/20/2016    Procedure: LUMBAR SPINAL CORD STIMULATOR INSERTION;  Surgeon: Clydell Hakim, MD;  Location: [facility];  Service: Neurosurgery;  Laterality: N/A;   Allergies  Allergen Reactions  . Skelaxin [Metaxalone] Rash    Cherry red rash with sloughing off of skin at left upper chest/shoulder  . Amaryl Other (See Comments)    Gives pt migraines  . Hydrocodone     Does not have much affect  . Metformin And Related Diarrhea  . Other Other (See Comments)    Pistachios and lima beans cause migraines  . Levofloxacin Nausea Only  . Lisinopril Other (See Comments)    cough   Current Outpatient Prescriptions  Medication Sig Dispense Refill  . acetaminophen (TYLENOL) 650 MG CR tablet Take 650 mg by mouth 2 (two) times daily.    Marland Kitchen amLODipine (NORVASC) 10 MG tablet Take 1 tablet daily (Patient taking differently: Take 10 mg by mouth daily after supper. ) 90 tablet 3  . aspirin 81 MG tablet Take 81 mg by mouth at bedtime.     Marland Kitchen atorvastatin (LIPITOR) 80 MG tablet TAKE 1 TABLET DAILY (Patient taking differently: Take 80 mg by mouth daily at  6 PM. ) 90 tablet 3  . bimatoprost (LUMIGAN) 0.03 % ophthalmic solution Place 1 drop into both eyes at bedtime. Reported on 01/01/2016    . brimonidine-timolol (COMBIGAN) 0.2-0.5 % ophthalmic solution Place 1 drop into both eyes 2 (two) times daily.    . chlorthalidone (HYGROTON) 25 MG tablet Take 1 tablet (25 mg total) by mouth daily. 90 tablet 3  . Cholecalciferol (VITAMIN D-3 PO) Take 5,000 Units by mouth 2 (two) times a week.     . clindamycin (CLEOCIN) 150 MG capsule Take 1 capsule (150 mg total) by mouth 3 (three) times daily. 21 capsule 0  . dorzolamide (TRUSOPT) 2 % ophthalmic solution INSTILL 1 DROP INTO BOTH EYES TWICE DAILY  0  . fish oil-omega-3 fatty acids 1000 MG capsule Take 2 g by mouth daily.    Marland Kitchen gemfibrozil (LOPID) 600 MG tablet TAKE 1 TABLET  DAILY. (Patient taking differently: Take 600 mg by mouth daily. ) 90 tablet 3  . metoprolol tartrate (LOPRESSOR) 25 MG tablet Take 25 mg by mouth 2 (two) times daily.    . Multiple Vitamins-Minerals (PRESERVISION AREDS 2) CAPS Take 1 capsule by mouth 2 (two) times daily.    Marland Kitchen OVER THE COUNTER MEDICATION Apply 1 application topically 2 (two) times daily. Reported on 03/12/2016    . sitaGLIPtin (JANUVIA) 100 MG tablet Take 1 tablet (100 mg total) by mouth daily. (Patient taking differently: Take 100 mg by mouth at bedtime. ) 90 tablet 3  . SUMAtriptan (IMITREX) 50 MG tablet Take 1 tablet (50 mg total) by mouth every 2 (two) hours as needed for migraine. 9 tablet 3   No current facility-administered medications for this visit.   Social History   Social History  . Marital Status: Married    Spouse Name: N/A  . Number of Children: 0  . Years of Education: Masters +   Occupational History  . Retired    Social History Main Topics  . Smoking status: Never Smoker   . Smokeless tobacco: Never Used  . Alcohol Use: 0.0 oz/week    0 Standard drinks or equivalent per week     Comment: seldom   . Drug Use: No  . Sexual Activity: Not on file   Other Topics Concern  . Not on file   Social History Narrative   2 cups of coffee a day, soda or tea about 3 times a week.    Family History  Problem Relation Age of Onset  . Heart disease Father   . Heart attack Father   . Heart disease Paternal Grandmother        Objective:    BP 143/76 mmHg  Pulse 73  Temp(Src) 98 F (36.7 C)  Resp 16  Ht 6' (1.829 m)  Wt 214 lb (97.07 kg)  BMI 29.02 kg/m2 Physical Exam  Constitutional: He appears well-developed and well-nourished. No distress.  Cardiovascular: Normal rate and regular rhythm.   Pulmonary/Chest: Effort normal and breath sounds normal. No respiratory distress. He has no rales.  Genitourinary:     5 mm pustular lesion with +TTP and minimal fluctuance.  Skin: He is not diaphoretic.    Results for orders placed or performed in visit on 03/26/16  Wound culture  Result Value Ref Range   Gram Stain No WBC Seen    Gram Stain No Squamous Epithelial Cells Seen    Gram Stain No Organisms Seen    Organism ID, Bacteria NO GROWTH 2 DAYS    PROCEDURE: VERBAL  CONSENT OBTAINED; 20 GAUGE NEEDLE ADVANCED INTO FOLLICULAR PROMINENCE; SMALL AMOUNT OF WHITE YELLOW DRAINAGE EXPRESSED.    Assessment & Plan:   1. Folliculitis    -New. -local wound care with warm compresses, cleaning with soap and water; topical antibiotic cream bid. -send wound culture. -RTC for increasing pain, drainage, redness, or fever.   Orders Placed This Encounter  Procedures  . Wound culture    Order Specific Question:  Source    Answer:  perianal region   No orders of the defined types were placed in this encounter.    No Follow-up on file.    Kristi Elayne Guerin, M.D. [facility] [address] [place], [place]  [address] [phone] phone [phone] fax

## 2016-05-01 DIAGNOSIS — E119 Type 2 diabetes mellitus without complications: Secondary | ICD-10-CM | POA: Diagnosis not present

## 2016-05-01 DIAGNOSIS — H401133 Primary open-angle glaucoma, bilateral, severe stage: Secondary | ICD-10-CM | POA: Diagnosis not present

## 2016-05-01 DIAGNOSIS — H2513 Age-related nuclear cataract, bilateral: Secondary | ICD-10-CM | POA: Diagnosis not present

## 2016-05-01 LAB — HM DIABETES EYE EXAM

## 2016-05-05 DIAGNOSIS — M5136 Other intervertebral disc degeneration, lumbar region: Secondary | ICD-10-CM | POA: Diagnosis not present

## 2016-05-05 DIAGNOSIS — Z6829 Body mass index (BMI) 29.0-29.9, adult: Secondary | ICD-10-CM | POA: Diagnosis not present

## 2016-05-05 DIAGNOSIS — Z9689 Presence of other specified functional implants: Secondary | ICD-10-CM | POA: Diagnosis not present

## 2016-05-05 DIAGNOSIS — M546 Pain in thoracic spine: Secondary | ICD-10-CM | POA: Diagnosis not present

## 2016-05-05 DIAGNOSIS — M4806 Spinal stenosis, lumbar region: Secondary | ICD-10-CM | POA: Diagnosis not present

## 2016-05-18 ENCOUNTER — Other Ambulatory Visit: Payer: Self-pay

## 2016-05-18 ENCOUNTER — Other Ambulatory Visit: Payer: Self-pay | Admitting: Family Medicine

## 2016-05-18 DIAGNOSIS — E119 Type 2 diabetes mellitus without complications: Secondary | ICD-10-CM

## 2016-05-18 MED ORDER — SITAGLIPTIN PHOSPHATE 100 MG PO TABS: 100.0000 mg | ORAL_TABLET | Freq: Every day | ORAL | Status: DC

## 2016-05-18 NOTE — Telephone Encounter (Signed)
Pt stoppped in to request Dr Tamala Julian do a written Rx for his Januiva 100 mg,one daily x 90 dys HE WOULD LIKE TO PICK UP THIS rx    BEST PHONE [phone]

## 2016-05-21 ENCOUNTER — Other Ambulatory Visit: Payer: Self-pay | Admitting: Family Medicine

## 2016-05-21 NOTE — Telephone Encounter (Signed)
Pt last seen 3/30 for DM, A1C 6.1 here in Feb. Pended for 90 day to print and sign for pt to p/up as req'd.

## 2016-05-22 ENCOUNTER — Other Ambulatory Visit: Payer: Self-pay | Admitting: *Deleted

## 2016-05-22 DIAGNOSIS — E119 Type 2 diabetes mellitus without complications: Secondary | ICD-10-CM

## 2016-05-22 MED ORDER — SITAGLIPTIN PHOSPHATE 100 MG PO TABS: 100.0000 mg | ORAL_TABLET | Freq: Every day | ORAL | Status: DC

## 2016-05-22 NOTE — Telephone Encounter (Signed)
Rx printed and provided to patient in office on 05/18/16.

## 2016-05-27 ENCOUNTER — Encounter: Payer: Self-pay | Admitting: Family Medicine

## 2016-05-27 DIAGNOSIS — I10 Essential (primary) hypertension: Secondary | ICD-10-CM

## 2016-05-27 DIAGNOSIS — Z1159 Encounter for screening for other viral diseases: Secondary | ICD-10-CM

## 2016-05-27 DIAGNOSIS — E7800 Pure hypercholesterolemia, unspecified: Secondary | ICD-10-CM

## 2016-05-27 DIAGNOSIS — E119 Type 2 diabetes mellitus without complications: Secondary | ICD-10-CM

## 2016-06-03 ENCOUNTER — Ambulatory Visit (INDEPENDENT_AMBULATORY_CARE_PROVIDER_SITE_OTHER): Payer: Medicare Other | Admitting: Family Medicine

## 2016-06-03 ENCOUNTER — Encounter: Payer: Self-pay | Admitting: Family Medicine

## 2016-06-03 ENCOUNTER — Telehealth: Payer: Self-pay

## 2016-06-03 ENCOUNTER — Ambulatory Visit (INDEPENDENT_AMBULATORY_CARE_PROVIDER_SITE_OTHER): Payer: Medicare Other

## 2016-06-03 VITALS — BP 128/82 | HR 68 | Temp 97.8°F | Resp 17 | Ht 73.0 in | Wt 219.0 lb

## 2016-06-03 DIAGNOSIS — E785 Hyperlipidemia, unspecified: Secondary | ICD-10-CM | POA: Diagnosis not present

## 2016-06-03 DIAGNOSIS — E119 Type 2 diabetes mellitus without complications: Secondary | ICD-10-CM

## 2016-06-03 DIAGNOSIS — G43709 Chronic migraine without aura, not intractable, without status migrainosus: Secondary | ICD-10-CM

## 2016-06-03 DIAGNOSIS — M25562 Pain in left knee: Secondary | ICD-10-CM | POA: Diagnosis not present

## 2016-06-03 DIAGNOSIS — I10 Essential (primary) hypertension: Secondary | ICD-10-CM | POA: Diagnosis not present

## 2016-06-03 DIAGNOSIS — Z1159 Encounter for screening for other viral diseases: Secondary | ICD-10-CM | POA: Diagnosis not present

## 2016-06-03 DIAGNOSIS — E7800 Pure hypercholesterolemia, unspecified: Secondary | ICD-10-CM | POA: Diagnosis not present

## 2016-06-03 DIAGNOSIS — M5136 Other intervertebral disc degeneration, lumbar region: Secondary | ICD-10-CM

## 2016-06-03 DIAGNOSIS — Z23 Encounter for immunization: Secondary | ICD-10-CM | POA: Diagnosis not present

## 2016-06-03 DIAGNOSIS — N200 Calculus of kidney: Secondary | ICD-10-CM | POA: Diagnosis not present

## 2016-06-03 LAB — LIPID PANEL
Cholesterol: 128 mg/dL (ref 125–200)
HDL: 34 mg/dL — ABNORMAL LOW (ref >=40)
LDL Cholesterol: 67 mg/dL (ref <130)
TRIGLYCERIDES: 133 mg/dL (ref <150)
Total CHOL/HDL Ratio: 3.8 Ratio (ref <=5.0)
VLDL: 27 mg/dL (ref <30)

## 2016-06-03 LAB — CBC WITH DIFFERENTIAL/PLATELET
BASOS ABS: 61 {cells}/uL (ref 0–200)
Basophils Relative: 1 %
EOS PCT: 2 %
Eosinophils Absolute: 122 cells/uL (ref 15–500)
HEMATOCRIT: 44.5 % (ref 38.5–50.0)
HEMOGLOBIN: 15.4 g/dL (ref 13.2–17.1)
LYMPHS ABS: 1403 {cells}/uL (ref 850–3900)
Lymphocytes Relative: 23 %
MCH: 32 pg (ref 27.0–33.0)
MCHC: 34.6 g/dL (ref 32.0–36.0)
MCV: 92.3 fL (ref 80.0–100.0)
MONO ABS: 549 {cells}/uL (ref 200–950)
MPV: 10.4 fL (ref 7.5–12.5)
Monocytes Relative: 9 %
NEUTROS PCT: 65 %
Neutro Abs: 3965 cells/uL (ref 1500–7800)
Platelets: 222 10*3/uL (ref 140–400)
RBC: 4.82 MIL/uL (ref 4.20–5.80)
RDW: 13.6 % (ref 11.0–15.0)
WBC: 6.1 10*3/uL (ref 3.8–10.8)

## 2016-06-03 LAB — POCT URINALYSIS DIP (MANUAL ENTRY)
Bilirubin, UA: NEGATIVE
Blood, UA: NEGATIVE
Glucose, UA: NEGATIVE
Ketones, POC UA: NEGATIVE
LEUKOCYTES UA: NEGATIVE
NITRITE UA: NEGATIVE
PH UA: 8.5
PROTEIN UA: NEGATIVE
Spec Grav, UA: 1.015
UROBILINOGEN UA: 0.2

## 2016-06-03 LAB — COMPREHENSIVE METABOLIC PANEL
ALBUMIN: 4.3 g/dL (ref 3.6–5.1)
ALT: 28 U/L (ref 9–46)
AST: 21 U/L (ref 10–35)
Alkaline Phosphatase: 67 U/L (ref 40–115)
BUN: 17 mg/dL (ref 7–25)
CALCIUM: 10.2 mg/dL (ref 8.6–10.3)
CHLORIDE: 102 mmol/L (ref 98–110)
CO2: 29 mmol/L (ref 20–31)
Creat: 0.9 mg/dL (ref 0.70–1.18)
Glucose, Bld: 155 mg/dL — ABNORMAL HIGH (ref 65–99)
POTASSIUM: 3.2 mmol/L — AB (ref 3.5–5.3)
Sodium: 142 mmol/L (ref 135–146)
Total Bilirubin: 0.5 mg/dL (ref 0.2–1.2)
Total Protein: 6.6 g/dL (ref 6.1–8.1)

## 2016-06-03 LAB — HEPATITIS C ANTIBODY: HCV AB: NEGATIVE

## 2016-06-03 LAB — TSH: TSH: 1.39 m[IU]/L (ref 0.40–4.50)

## 2016-06-03 MED ORDER — DICLOFENAC SODIUM 1 % TD GEL: 4.0000 g | Freq: Four times a day (QID) | TRANSDERMAL | Status: DC

## 2016-06-03 MED ORDER — EMPAGLIFLOZIN 10 MG PO TABS: 10.0000 mg | ORAL_TABLET | Freq: Every day | ORAL | Status: DC

## 2016-06-03 NOTE — Progress Notes (Signed)
Subjective:    Patient ID: [name], male    DOB: October 30, 1946, 70 y.o.   MRN: [id number]  06/03/2016  Follow-up (diabetic check) and Knee Pain (left )   HPI This 70 y.o. male presents for follow-up:  1. DMII: fasting sugars 150-160.  Stopped Metformin due to fecal incontinence; no further fecal incontinence.  Taking Januvia 137m daily.  Previous small tablet prescribed by Dr. RDarron Doom caused migraines.  Also on Victoza in the past which prevented Imitrex from absorption.    2. Hyperlipidemia: Patient reports good compliance with medication, good tolerance to medication, and good symptom control.  Lipitor 83mdaily.  Gemfibrozil.    3.  Migraines: two migraines per month; food triggered.  Imitrex works well.  Lima beans, pistachio, cheddar cheese, red wine, chocolate.  4.  HTN: Patient reports good compliance with medication, good tolerance to medication, and good symptom control.  Amlodipine, Hygrotin, Metoprolol.  5.  Arthralgias and myalgias: having a lot of problems; decreased dose in 1/2.    6.  Nephrolithiasis: Ibuprofen PRN.  7.  DDD lumbar: stimulator placement.   8.  L knee pain: has a very sore connector on the L lateral knee.  Does not hurt most of the time; if goes to 90 degrees, has pain.  Really throbs and hurts.  Would like cortizone.  Onset two months.  Minimal swelling.  No locking or giving out.  No injury.     Review of Systems  Constitutional: Negative for fever, chills, diaphoresis, activity change, appetite change and fatigue.  Respiratory: Negative for cough and shortness of breath.   Cardiovascular: Negative for chest pain, palpitations and leg swelling.  Gastrointestinal: Negative for nausea, vomiting, abdominal pain and diarrhea.  Endocrine: Negative for cold intolerance, heat intolerance, polydipsia, polyphagia and polyuria.  Musculoskeletal: Positive for arthralgias and back pain.  Skin: Negative for color change, rash and wound.   Neurological: Negative for dizziness, tremors, seizures, syncope, facial asymmetry, speech difficulty, weakness, light-headedness, numbness and headaches.  Psychiatric/Behavioral: Negative for sleep disturbance and dysphoric mood. The patient is not nervous/anxious.     Past Medical History:  Diagnosis Date  . Arthritis    DDD, scoliosis, stenosis    . 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 Cancer (HCC)    skin ca, posterior R ear  . Chronic kidney disease    Hx of stones  . Complication of anesthesia    difficulty intubation , anterior larynx, easy mask ventilation  . Coronary artery disease   . Diabetes mellitus without complication ([facility]  . Difficult intubation   . Diverticulitis   . Glaucoma   . Headache(784.0)    migraines  . Hyperlipemia   . Hypertension   . Sleep apnea    CPAP still in use q night, & for naps thru out the day, pt. reports that last study was neg for sleep apnea but he still uses the device    . Spinal stenosis   . Ventricular dilatation    Right   Past Surgical History:  Procedure Laterality Date  . APPENDECTOMY  1991  . BOWEL RESECTION  1985  . CARDIAC CATHETERIZATION  2006  . CHOLECYSTECTOMY  1990  . COLON SURGERY     bowel resection, for diverticulitis   . COLONOSCOPY N/A 06/15/2013   Procedure: COLONOSCOPY;  Surgeon: PaBeryle BeamsMD;  Location: [facility];  Service: Endoscopy;  Laterality: N/A;  . CORONARY ANGIOPLASTY    . LITHOTRIPSY    .  SPINAL CORD STIMULATOR INSERTION N/A 03/20/2016   Procedure: LUMBAR SPINAL CORD STIMULATOR INSERTION;  Surgeon: Clydell Hakim, MD;  Location: [facility];  Service: Neurosurgery;  Laterality: N/A;  . TONSILLECTOMY     Allergies  Allergen Reactions  . Skelaxin [Metaxalone] Rash    Cherry red rash with sloughing off of skin at left upper chest/shoulder  . Amaryl Other (See Comments)    Gives pt migraines  . Hydrocodone     Does not have much affect  .  Metformin And Related Diarrhea  . Other Other (See Comments)    Pistachios and lima beans cause migraines  . Levofloxacin Nausea Only  . Lisinopril Other (See Comments)    cough   Current Outpatient Prescriptions  Medication Sig Dispense Refill  . acetaminophen (TYLENOL) 650 MG CR tablet Take 650 mg by mouth 2 (two) times daily.    Marland Kitchen amLODipine (NORVASC) 10 MG tablet Take 1 tablet daily (Patient taking differently: Take 10 mg by mouth daily after supper. ) 90 tablet 3  . aspirin 81 MG tablet Take 81 mg by mouth at bedtime.     Marland Kitchen atorvastatin (LIPITOR) 80 MG tablet TAKE 1 TABLET DAILY (Patient taking differently: Take 80 mg by mouth daily at 6 PM. ) 90 tablet 3  . bimatoprost (LUMIGAN) 0.03 % ophthalmic solution Place 1 drop into both eyes at bedtime. Reported on 01/01/2016    . brimonidine-timolol (COMBIGAN) 0.2-0.5 % ophthalmic solution Place 1 drop into both eyes 2 (two) times daily.    . chlorthalidone (HYGROTON) 25 MG tablet Take 1 tablet (25 mg total) by mouth daily. 90 tablet 3  . Cholecalciferol (VITAMIN D-3 PO) Take 5,000 Units by mouth 2 (two) times a week.     . dorzolamide (TRUSOPT) 2 % ophthalmic solution INSTILL 1 DROP INTO BOTH EYES TWICE DAILY  0  . fish oil-omega-3 fatty acids 1000 MG capsule Take 2 g by mouth daily.    Marland Kitchen gemfibrozil (LOPID) 600 MG tablet TAKE 1 TABLET DAILY. (Patient taking differently: Take 600 mg by mouth daily. ) 90 tablet 3  . glucose blood (FREESTYLE LITE) test strip Test blood sugar once daily. Dx: E11.9 100 each 3  . Lancets (FREESTYLE) lancets Test blood sugar once daily. Dx: E11.9 100 each 3  . metoprolol tartrate (LOPRESSOR) 25 MG tablet Take 25 mg by mouth 2 (two) times daily.    . Multiple Vitamins-Minerals (PRESERVISION AREDS 2) CAPS Take 1 capsule by mouth 2 (two) times daily.    Marland Kitchen OVER THE COUNTER MEDICATION Apply 1 application topically 2 (two) times daily. Reported on 03/12/2016    . sitaGLIPtin (JANUVIA) 100 MG tablet Take 1 tablet (100 mg  total) by mouth daily. 90 tablet 0  . SUMAtriptan (IMITREX) 50 MG tablet Take 1 tablet (50 mg total) by mouth every 2 (two) hours as needed for migraine. 9 tablet 3  . clindamycin (CLEOCIN) 150 MG capsule Take 1 capsule (150 mg total) by mouth 3 (three) times daily. (Patient not taking: Reported on 06/03/2016) 21 capsule 0  . diclofenac sodium (VOLTAREN) 1 % GEL Apply 4 g topically 4 (four) times daily. 100 g 3  . empagliflozin (JARDIANCE) 10 MG TABS tablet Take 10 mg by mouth daily. 90 tablet 1   No current facility-administered medications for this visit.    Social History   Social History  . Marital status: Married    Spouse name: N/A  . Number of children: 0  . Years of education: Masters +  Occupational History  . Retired    Social History Main Topics  . Smoking status: Never Smoker  . Smokeless tobacco: Never Used  . Alcohol use 0.0 oz/week     Comment: seldom   . Drug use: No  . Sexual activity: Not on file   Other Topics Concern  . Not on file   Social History Narrative   2 cups of coffee a day, soda or tea about 3 times a week.    Family History  Problem Relation Age of Onset  . Heart disease Father   . Heart attack Father   . Heart disease Paternal Grandmother        Objective:    BP 128/82   Pulse 68   Temp 97.8 F (36.6 C) (Oral)   Resp 17   Ht 6' 1"  (1.854 m)   Wt 219 lb (99.3 kg)   SpO2 94%   BMI 28.89 kg/m  Physical Exam  Constitutional: He is oriented to person, place, and time. He appears well-developed and well-nourished. No distress.  HENT:  Head: Normocephalic and atraumatic.  Right Ear: External ear normal.  Left Ear: External ear normal.  Nose: Nose normal.  Mouth/Throat: Oropharynx is clear and moist.  Eyes: Conjunctivae and EOM are normal. Pupils are equal, round, and reactive to light.  Neck: Normal range of motion. Neck supple. Carotid bruit is not present. No thyromegaly present.  Cardiovascular: Normal rate, regular rhythm,  normal heart sounds and intact distal pulses.  Exam reveals no gallop and no friction rub.   No murmur heard. Pulmonary/Chest: Effort normal and breath sounds normal. He has no wheezes. He has no rales.  Abdominal: Soft. Bowel sounds are normal. He exhibits no distension and no mass. There is no tenderness. There is no rebound and no guarding.  Lymphadenopathy:    He has no cervical adenopathy.  Neurological: He is alert and oriented to person, place, and time. No cranial nerve deficit.  Skin: Skin is warm and dry. No rash noted. He is not diaphoretic.  Psychiatric: He has a normal mood and affect. His behavior is normal.  Nursing note and vitals reviewed.  Results for orders placed or performed in visit on 06/03/16  CBC with Differential/Platelet  Result Value Ref Range   WBC 6.1 3.8 - 10.8 K/uL   RBC 4.82 4.20 - 5.80 MIL/uL   Hemoglobin 15.4 13.2 - 17.1 g/dL   HCT 44.5 38.5 - 50.0 %   MCV 92.3 80.0 - 100.0 fL   MCH 32.0 27.0 - 33.0 pg   MCHC 34.6 32.0 - 36.0 g/dL   RDW 13.6 11.0 - 15.0 %   Platelets 222 140 - 400 K/uL   MPV 10.4 7.5 - 12.5 fL   Neutro Abs 3,965 1,500 - 7,800 cells/uL   Lymphs Abs 1,403 850 - 3,900 cells/uL   Monocytes Absolute 549 200 - 950 cells/uL   Eosinophils Absolute 122 15 - 500 cells/uL   Basophils Absolute 61 0 - 200 cells/uL   Neutrophils Relative % 65 %   Lymphocytes Relative 23 %   Monocytes Relative 9 %   Eosinophils Relative 2 %   Basophils Relative 1 %   Smear Review Criteria for review not met   Comprehensive metabolic panel  Result Value Ref Range   Sodium 142 135 - 146 mmol/L   Potassium 3.2 (L) 3.5 - 5.3 mmol/L   Chloride 102 98 - 110 mmol/L   CO2 29 20 - 31 mmol/L   Glucose,  Bld 155 (H) 65 - 99 mg/dL   BUN 17 7 - 25 mg/dL   Creat 0.90 0.70 - 1.18 mg/dL   Total Bilirubin 0.5 0.2 - 1.2 mg/dL   Alkaline Phosphatase 67 40 - 115 U/L   AST 21 10 - 35 U/L   ALT 28 9 - 46 U/L   Total Protein 6.6 6.1 - 8.1 g/dL   Albumin 4.3 3.6 - 5.1 g/dL    Calcium 10.2 8.6 - 10.3 mg/dL  Lipid panel  Result Value Ref Range   Cholesterol 128 125 - 200 mg/dL   Triglycerides 133 <150 mg/dL   HDL 34 (L) >=40 mg/dL   Total CHOL/HDL Ratio 3.8 <=5.0 Ratio   VLDL 27 <30 mg/dL   LDL Cholesterol 67 <130 mg/dL  Microalbumin, urine  Result Value Ref Range   Microalb, Ur 0.5 Not estab mg/dL  TSH  Result Value Ref Range   TSH 1.39 0.40 - 4.50 mIU/L  Hepatitis C antibody  Result Value Ref Range   HCV Ab NEGATIVE NEGATIVE  POCT urinalysis dipstick  Result Value Ref Range   Color, UA yellow yellow   Clarity, UA clear clear   Glucose, UA negative negative   Bilirubin, UA negative negative   Ketones, POC UA negative negative   Spec Grav, UA 1.015    Blood, UA negative negative   pH, UA 8.5    Protein Ur, POC negative negative   Urobilinogen, UA 0.2    Nitrite, UA Negative Negative   Leukocytes, UA Negative Negative       Assessment & Plan:   1. Type 2 diabetes mellitus without complication, without long-term current use of insulin ([place])   2. Degenerative disc disease, lumbar   3. Essential hypertension   4. Dyslipidemia   5. Lateral knee pain, left   6. Pure hypercholesterolemia   7. Need for hepatitis C screening test     Orders Placed This Encounter  Procedures  . DG Knee Complete 4 Views Left    Standing Status:   Future    Number of Occurrences:   1    Standing Expiration Date:   06/03/2017    Order Specific Question:   Reason for Exam (SYMPTOM  OR DIAGNOSIS REQUIRED)    Answer:   L lateral knee pain for two months    Order Specific Question:   Preferred imaging location?    Answer:   External  . Pneumococcal polysaccharide vaccine 23-valent greater than or equal to 2yo subcutaneous/IM  . HM Diabetes Foot Exam   Meds ordered this encounter  Medications  . empagliflozin (JARDIANCE) 10 MG TABS tablet    Sig: Take 10 mg by mouth daily.    Dispense:  90 tablet    Refill:  1  . diclofenac sodium (VOLTAREN) 1 % GEL    Sig:  Apply 4 g topically 4 (four) times daily.    Dispense:  100 g    Refill:  3    Return in about 3 months (around 09/03/2016) for recheck diabetes .    Venissa Nappi Elayne Guerin, M.D. [facility] [address] [place], [place]  [phone] phone [phone] fax

## 2016-06-03 NOTE — Patient Instructions (Signed)
Lateral Collateral Knee Ligament Sprain With Phase I Rehab The lateral collateral ligament (LCL) of the knee helps hold the knee joint in proper alignment and prevents the bones from shifting out of alignment (displacing) toward the outside (laterally). Injury to the knee may cause a tear in the LCL ligament (sprain). The LCL is the least common ligament of the knee to be injured. Sprains may heal on their own, but they often result in a loose joint. Sprains are classified into three categories. Grade 1 sprains cause pain, but the tendon is not lengthened. Grade 2 sprains include a lengthened ligament, due to the ligament being stretched or partially ruptured. With grade 2 sprains there is still function, although the function may be decreased. Grade 3 sprains involve a complete tear of the tendon or muscle, and function is usually impaired. SYMPTOMS   Pain and tenderness on the outer side of the knee.  A "pop," tearing, or pulling sensation at the time of injury.  Bruising (contusion) at the site of injury within 48 hours of injury.  Knee stiffness.  Limping, often walking with the knee bent. CAUSES  An LCL sprain occurs when a force is placed on the ligament that is greater than it can handle. Common causes of injury include:  Direct hit (trauma) to the inner side of the knee, especially if the foot is planted on the ground.  Forceful pivoting of the body and leg while the foot is planted on the ground. RISK INCREASES WITH:  Contact sports (football, rugby).  Sports that require pivoting or cutting (soccer).  Poor knee strength and flexibility.  Improper equipment use. PREVENTION   Warm up and stretch properly before activity.  Maintain physical fitness:  Strength, flexibility, and endurance.  Cardiovascular fitness.  Wear properly fitted protective equipment (correct length of cleats for surface).  Functional braces may be effective in preventing injury. PROGNOSIS  If  treated properly, LCL tears usually heal on their own. Sometimes, surgery is required. RELATED COMPLICATIONS   Frequently recurring symptoms, such as knee giving way, instability, and swelling.  Injury to other structures in the knee joint.  Meniscal cartilage, resulting in locking and swelling of the knee.  Articular cartilage, resulting in knee arthritis.  Other ligaments of the knee (commonly).  Injury to nerves, causing numbness of the outer leg, foot, and ankle and weakness or paralysis, with inability to raise the ankle, big toe, or lesser toes.  Knee stiffness (loss of knee motion). TREATMENT  Treatment first involves the use of ice and medicine to reduce pain and inflammation. The use of strengthening and stretching exercises may help reduce pain with activity. These exercises may be performed at home, but referral to a therapist is often advised. You may be advised to walk with crutches until you are able to walk without a limp. Your caregiver may provide you with a hinged knee brace to help regain a full range of motion while also protecting the injured knee. For severe LCL injuries, or injuries that involve other ligaments of the knee, surgery is often advised. MEDICATION   If pain medicine is needed, nonsteroidal anti-inflammatory medicines (aspirin and ibuprofen), or other minor pain relievers (acetaminophen), are often advised.  Do not take pain medicine for 7 days before surgery.  Prescription pain relievers may be given, if your caregiver thinks they are needed. Use only as directed and only as much as you need. HEAT AND COLD  Cold treatment (icing) should be applied for 10 to 15 minutes  every 2 to 3 hours for inflammation and pain, and immediately after activity that aggravates your symptoms. Use ice packs or an ice massage.  Heat treatment may be used before performing stretching and strengthening activities prescribed by your caregiver, physical therapist, or athletic  trainer. Use a heat pack or a warm water soak. SEEK MEDICAL CARE IF:   Symptoms get worse or do not improve in 4 to 6 weeks, despite treatment.  New, unexplained symptoms develop. (Drugs used in treatment may produce side effects.) EXERCISES RANGE OF MOTION (ROM) AND STRETCHING EXERCISES - Lateral Collateral Knee Ligament Sprain Phase I These are some of the initial exercises that your physician, physical therapist or athletic trainer may have you perform to begin your rehabilitation. When you demonstrate gains in your flexibility and strength, your caregiver may progress you to Phase II exercises. As you perform these exercises, remember:   These initial exercises are intended to be gentle. They will help you restore motion without increasing any swelling.  Completing these exercises allows less painful movement and prepares you for the more aggressive strengthening exercises in Phase II.  An effective stretch should be held for at least 30 seconds.  A stretch should never be painful. You should only feel a gentle lengthening or release in the stretched tissue. RANGE OF MOTION - Knee Flexion, Active  Lie on your back with both knees straight. (If this causes back discomfort, bend your opposite knee, placing your foot flat on the floor.)  Slowly slide your heel back toward your buttocks until you feel a gentle stretch in the front of your knee or thigh.  Hold for __________ seconds. Slowly slide your heel back to the starting position. Repeat __________ times. Complete this exercise __________ times per day.  STRETCH - Knee Flexion, Supine  Lie on the floor with your right / left heel and foot lightly touching the wall. (Place both feet on the wall, if you do not use a door frame.)  Without using any effort, allow gravity to slide your foot down the wall slowly until you feel a gentle stretch in the front of your right / left knee.  Hold this stretch for __________ seconds. Then return  the leg to the starting position, using your healthy leg for help, if needed. Repeat __________ times. Complete this stretch __________ times per day.  RANGE OF MOTION - Knee Flexion and Extension, Active-Assisted  Sit on the edge of a table or chair with your thighs firmly supported. It may be helpful to place a folded towel under the end of your right / left thigh.  Flexion (bending): Place the ankle of your healthy leg on top of the other ankle. Use your healthy leg to gently bend your right / left knee until you feel a mild tension across the top of your knee.  Hold for __________ seconds.  Extension (straightening): Switch your ankles so your right / left leg is on top. Use your healthy leg to straighten your right / left knee until you feel a mild tension on the backside of your knee.  Hold for __________ seconds. Repeat __________ times. Complete this exercise __________ times per day. STRETCH - Knee Extension Sitting  Sit with your right / left leg/heel propped on another chair, coffee table, or foot stool.  Allow your leg muscles to relax, letting gravity straighten out your knee.*  You should feel a stretch behind your right / left knee. Hold this position for __________ seconds. Repeat __________ times.  Complete this stretch __________ times per day.  *Your physician, physical therapist, or athletic trainer may instruct you place a __________ weight on your thigh, just above your kneecap, to deepen the stretch.  STRENGTHENING EXERCISES Lateral Collateral Knee Ligament Sprain - Phase I These exercises may help you when beginning to rehabilitate your injury. They may resolve your symptoms with or without further involvement from your physician, physical therapist, or athletic trainer. While completing these exercises, remember:   Muscles can gain both the endurance and the strength needed for everyday activities through controlled exercises.  Complete these exercises as  instructed by your physician, physical therapist or athletic trainer. Increase the resistance and repetitions only as guided.  In order to return to more demanding activities, you will likely need to progress to more challenging exercises. Your physician, physical therapist or athletic trainer will advance your exercises when your tissues show adequate healing and your muscles demonstrate increased strength. STRENGTH - Quadriceps, Isometrics  Lie on your back with your right / left leg extended and your opposite knee bent.  Gradually tense the muscles in the front of your right / left thigh. You should see either your kneecap slide up toward your hip or increased dimpling just above the knee. This motion will push the back of the knee down toward the floor, mat, or bed on which you are lying.  Hold the muscle as tight as you can without increasing your pain for __________ seconds.  Relax the muscles slowly and completely between each repetition. Repeat __________ times. Complete this exercise __________ times per day.  STRENGTH - Quadriceps, Short Arcs   Lie on your back. Place a __________ inch towel roll under your right / left knee, so that the knee bends slightly.  Raise only your lower leg by tightening the muscles in the front of your thigh. Do not allow your thigh to rise.  Hold this position for __________ seconds. Repeat __________ times. Complete this exercise __________ times per day.  OPTIONAL ANKLE WEIGHTS: Begin with ____________________, but DO NOT exceed ____________________. Increase in 1 pound/0.5 kilogram increments. STRENGTH - Quadriceps, Straight Leg Raises  Quality counts! Watch for signs that the quadriceps muscle is working, to be sure you are strengthening the correct muscles and not "cheating" by substituting with healthier muscles.  Lie on your back with your right / left leg extended and your opposite knee bent.  Tense the muscles in the front of your right /  left thigh. You should see either your kneecap slide up or increased dimpling just above the knee. Your thigh may even shake a bit.  Tighten these muscles even more and raise your leg 4 to 6 inches off the floor. Hold for __________ seconds.  Keeping these muscles tense, lower your leg.  Relax the muscles slowly and completely in between each repetition. Repeat __________ times. Complete this exercise __________ times per day.  STRENGTH - Hamstring, Isometrics   Lie on your back, on a firm surface.  Bend your right / left knee approximately __________ degrees.  Dig your heel into the surface as if you are trying to pull it toward your buttocks. Tighten the muscles in the back of your thighs to "dig" as hard as you can, without increasing any pain.  Hold this position for __________ seconds.  Release the tension gradually and allow your muscle to completely relax for __________ seconds in between each exercise. Repeat __________ times. Complete this exercise __________ times per day.  STRENGTH - Hamstring,   Curls   Lie on your stomach with your legs extended. (If you lie on a bed, your feet may hang over the edge.)  Tighten the muscles in the back of your thigh to bend your right / left knee up to 90 degrees. Keep your hips flat on the bed.  Hold this position for __________ seconds.  Slowly lower your leg back to the starting position. Repeat __________ times. Complete this exercise __________ times per day.  OPTIONAL ANKLE WEIGHTS: Begin with ____________________, but DO NOT exceed ____________________. Increase in 1 pound/0.5 kilogram increments.   This information is not intended to replace advice given to you by your health care provider. Make sure you discuss any questions you have with your health care provider.   Document Released: 11/02/2005 Document Revised: 11/23/2014 Document Reviewed: 02/14/2009 Elsevier Interactive Patient Education [employer].

## 2016-06-03 NOTE — Telephone Encounter (Signed)
[facility] Medicare dept faxed a DM supplies form for completion. I completed it and put in Dr Thompson Caul box for signing/faxing.

## 2016-06-04 LAB — MICROALBUMIN, URINE: Microalb, Ur: 0.5 mg/dL

## 2016-06-05 DIAGNOSIS — S63657D Sprain of metacarpophalangeal joint of left little finger, subsequent encounter: Secondary | ICD-10-CM | POA: Diagnosis not present

## 2016-07-03 DIAGNOSIS — S63657D Sprain of metacarpophalangeal joint of left little finger, subsequent encounter: Secondary | ICD-10-CM | POA: Diagnosis not present

## 2016-07-07 ENCOUNTER — Ambulatory Visit: Payer: Medicare Other | Admitting: Neurology

## 2016-08-04 DIAGNOSIS — S63657D Sprain of metacarpophalangeal joint of left little finger, subsequent encounter: Secondary | ICD-10-CM | POA: Diagnosis not present

## 2016-08-20 ENCOUNTER — Encounter: Payer: Self-pay | Admitting: Family Medicine

## 2016-08-25 MED ORDER — EMPAGLIFLOZIN 25 MG PO TABS: 25.0000 mg | ORAL_TABLET | Freq: Every day | ORAL | 0 refills | Status: DC

## 2016-08-25 MED ORDER — EMPAGLIFLOZIN 10 MG PO TABS: 10.0000 mg | ORAL_TABLET | Freq: Every day | ORAL | 0 refills | Status: DC

## 2016-08-25 NOTE — Telephone Encounter (Signed)
Chelle, I reprinted because Dr Thompson Caul note below advised to increase dose to 25 mg QD instead of 10 mg he was on. Thanks!

## 2016-08-25 NOTE — Telephone Encounter (Signed)
   Rx printed at 104. Will bring to 102 after clinic.  Meds ordered this encounter  Medications  . empagliflozin (JARDIANCE) 10 MG TABS tablet    Sig: Take 10 mg by mouth daily.    Dispense:  90 tablet    Refill:  0    Order Specific Question:   Supervising Provider    Answer:   Brigitte Pulse, EVA N [[id number]]

## 2016-08-25 NOTE — Telephone Encounter (Signed)
Can you please PRINT off a prescription for Jardiance 25mg  one tablet daily (this is a higher dose; I want to increase to 25mg  from 10mg ) as I am out of town for the next two days.  Thanks!

## 2016-08-29 ENCOUNTER — Encounter: Payer: Self-pay | Admitting: Family Medicine

## 2016-08-29 ENCOUNTER — Ambulatory Visit (INDEPENDENT_AMBULATORY_CARE_PROVIDER_SITE_OTHER): Payer: Medicare Other | Admitting: Family Medicine

## 2016-08-29 VITALS — BP 110/68 | HR 60 | Temp 98.2°F | Resp 18 | Ht 73.0 in | Wt 213.0 lb

## 2016-08-29 DIAGNOSIS — E119 Type 2 diabetes mellitus without complications: Secondary | ICD-10-CM

## 2016-08-29 DIAGNOSIS — M5136 Other intervertebral disc degeneration, lumbar region: Secondary | ICD-10-CM

## 2016-08-29 DIAGNOSIS — Z23 Encounter for immunization: Secondary | ICD-10-CM | POA: Diagnosis not present

## 2016-08-29 DIAGNOSIS — I10 Essential (primary) hypertension: Secondary | ICD-10-CM | POA: Diagnosis not present

## 2016-08-29 DIAGNOSIS — M25562 Pain in left knee: Secondary | ICD-10-CM

## 2016-08-29 DIAGNOSIS — E785 Hyperlipidemia, unspecified: Secondary | ICD-10-CM | POA: Diagnosis not present

## 2016-08-29 LAB — COMPREHENSIVE METABOLIC PANEL
ALK PHOS: 51 U/L (ref 40–115)
ALT: 18 U/L (ref 9–46)
AST: 24 U/L (ref 10–35)
Albumin: 4.3 g/dL (ref 3.6–5.1)
BILIRUBIN TOTAL: 0.7 mg/dL (ref 0.2–1.2)
BUN: 18 mg/dL (ref 7–25)
CHLORIDE: 102 mmol/L (ref 98–110)
CO2: 25 mmol/L (ref 20–31)
Calcium: 10 mg/dL (ref 8.6–10.3)
Creat: 0.93 mg/dL (ref 0.70–1.18)
GLUCOSE: 127 mg/dL — AB (ref 65–99)
POTASSIUM: 3.9 mmol/L (ref 3.5–5.3)
SODIUM: 140 mmol/L (ref 135–146)
Total Protein: 6.5 g/dL (ref 6.1–8.1)

## 2016-08-29 LAB — GLUCOSE, POCT (MANUAL RESULT ENTRY): POC GLUCOSE: 126 mg/dL — AB (ref 70–99)

## 2016-08-29 LAB — POCT GLYCOSYLATED HEMOGLOBIN (HGB A1C): HEMOGLOBIN A1C: 6

## 2016-08-29 MED ORDER — EMPAGLIFLOZIN 25 MG PO TABS: 25.0000 mg | ORAL_TABLET | Freq: Every day | ORAL | 3 refills | Status: DC

## 2016-08-29 MED ORDER — ROSUVASTATIN CALCIUM 10 MG PO TABS: 10.0000 mg | ORAL_TABLET | Freq: Every day | ORAL | 5 refills | Status: DC

## 2016-08-29 NOTE — Progress Notes (Signed)
Subjective:    Patient ID: [name], male    DOB: 01/08/1946, 70 y.o.   MRN: [id number]  08/29/2016  Follow-up (3 MONTH) and Flu Vaccine   HPI This 70 y.o. male presents for three month follow-up for DMII, hypertension,dyslipidemia.  Added Jardiance 10mg  daily at last visit. Sugars running 125-140 every morning; patient requesting an increase in the dose of Jardiance; tolerating medication without side effects.  Goes to [employer] some for exercise.  Had one single episode of angina two weeks ago while sitting and reading; L sided chest pain; no radiation; no associated SOB, diaphoresis, nausea.  Has not had chest pain while exercising or with exertion. Does not have NTG at home; chest pain resolved spontaneously.  DDD lumbar: nerve stimulator not doing as well as trial device. Able to ambulate without a cane which is progress.  Taking Tylenol bid.  Has opiates at home but refuses to take them for pain control; wants to avoid this option.  L knee pain: persistent lateral knee pain at night when sleeping a certain way; also has lateral knee pain with prolonged flexion of knee; no swelling; no giving out; Diclofenac gel not very effective.  Has seen Dr. Apolonio Schneiders who has injected L fifth finger; [facility].    Desires a trial of a different statin due to leg pain and leg cramps.  Has been maintained on Gemfibrozil for ten years; only takes one daily.  Not sure of how high triglycerides were running when started it.  Currently takes Atorvastatin 80mg  1/2 daily.   Suffers with seasonal allergies; has suffered nose bleeds with nasal steroids; had to present to ED on three separate occasions for epistaxis secondary to nasal steroid; prefers to sneeze.  Review of Systems  Constitutional: Negative for activity change, appetite change, chills, diaphoresis, fatigue and fever.  HENT: Positive for sneezing.   Eyes: Negative for visual disturbance.  Respiratory: Negative for cough and  shortness of breath.   Cardiovascular: Positive for chest pain. Negative for palpitations and leg swelling.  Endocrine: Negative for cold intolerance, heat intolerance, polydipsia, polyphagia and polyuria.  Musculoskeletal: Positive for arthralgias, back pain and gait problem. Negative for joint swelling.  Neurological: Negative for dizziness, tremors, seizures, syncope, facial asymmetry, speech difficulty, weakness, light-headedness, numbness and headaches.    Past Medical History:  Diagnosis Date  . Arthritis    DDD, scoliosis, stenosis    . 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 Cancer (HCC)    skin ca, posterior R ear  . Chronic kidney disease    Hx of stones  . Complication of anesthesia    difficulty intubation , anterior larynx, easy mask ventilation  . Coronary artery disease   . Diabetes mellitus without complication ([place])   . Difficult intubation   . Diverticulitis   . Glaucoma   . Headache(784.0)    migraines  . Hyperlipemia   . Hypertension   . Sleep apnea    CPAP still in use q night, & for naps thru out the day, pt. reports that last study was neg for sleep apnea but he still uses the device    . Spinal stenosis   . Ventricular dilatation    Right   Past Surgical History:  Procedure Laterality Date  . APPENDECTOMY  1991  . BOWEL RESECTION  1985  . CARDIAC CATHETERIZATION  2006  . CHOLECYSTECTOMY  1990  . COLON SURGERY     bowel resection, for diverticulitis   .  COLONOSCOPY N/A 06/15/2013   Procedure: COLONOSCOPY;  Surgeon: Beryle Beams, MD;  Location: [facility];  Service: Endoscopy;  Laterality: N/A;  . CORONARY ANGIOPLASTY    . LITHOTRIPSY    . SPINAL CORD STIMULATOR INSERTION N/A 03/20/2016   Procedure: LUMBAR SPINAL CORD STIMULATOR INSERTION;  Surgeon: Clydell Hakim, MD;  Location: [place] [facility];  Service: Neurosurgery;  Laterality: N/A;  . TONSILLECTOMY     Allergies  Allergen Reactions  . Skelaxin  [Metaxalone] Rash    Cherry red rash with sloughing off of skin at left upper chest/shoulder  . Amaryl Other (See Comments)    Gives pt migraines  . Hydrocodone     Does not have much affect  . Metformin And Related Diarrhea  . Other Other (See Comments)    Pistachios and lima beans cause migraines  . Levofloxacin Nausea Only  . Lisinopril Other (See Comments)    cough    Social History   Social History  . Marital status: Married    Spouse name: N/A  . Number of children: 0  . Years of education: Masters +   Occupational History  . Retired    Social History Main Topics  . Smoking status: Never Smoker  . Smokeless tobacco: Never Used  . Alcohol use 0.0 oz/week     Comment: seldom   . Drug use: No  . Sexual activity: Not on file   Other Topics Concern  . Not on file   Social History Narrative   2 cups of coffee a day, soda or tea about 3 times a week.    Family History  Problem Relation Age of Onset  . Heart disease Father   . Heart attack Father   . Heart disease Paternal Grandmother        Objective:    BP 110/68 (BP Location: Right Arm, Patient Position: Sitting, Cuff Size: Small)   Pulse 60   Temp 98.2 F (36.8 C) (Oral)   Resp 18   Ht 6\' 1"  (1.854 m)   Wt 213 lb (96.6 kg)   SpO2 98%   BMI 28.10 kg/m  Physical Exam  Constitutional: He is oriented to person, place, and time. He appears well-developed and well-nourished. No distress.  HENT:  Head: Normocephalic and atraumatic.  Right Ear: External ear normal.  Left Ear: External ear normal.  Nose: Nose normal.  Mouth/Throat: Oropharynx is clear and moist.  Eyes: Conjunctivae and EOM are normal. Pupils are equal, round, and reactive to light.  Neck: Normal range of motion. Neck supple. Carotid bruit is not present. No thyromegaly present.  Cardiovascular: Normal rate, regular rhythm, normal heart sounds and intact distal pulses.  Exam reveals no gallop and no friction rub.   No murmur heard. Trace  to 1+ non-pitting edema BLE.  Pulmonary/Chest: Effort normal and breath sounds normal. He has no wheezes. He has no rales.  Musculoskeletal: He exhibits edema.       Left knee: He exhibits normal range of motion and no swelling. No tenderness found. No medial joint line and no lateral joint line tenderness noted.  Lymphadenopathy:    He has no cervical adenopathy.  Neurological: He is alert and oriented to person, place, and time. No cranial nerve deficit.  Skin: Skin is warm and dry. No rash noted. He is not diaphoretic.  Psychiatric: He has a normal mood and affect. His behavior is normal.  Nursing note and vitals reviewed.  Results for orders placed or performed in visit on  08/29/16  POCT glucose (manual entry)  Result Value Ref Range   POC Glucose 126 (A) 70 - 99 mg/dl  POCT glycosylated hemoglobin (Hb A1C)  Result Value Ref Range   Hemoglobin A1C 6.0        Assessment & Plan:   1. Type 2 diabetes mellitus without complication, without long-term current use of insulin ([place])   2. Essential hypertension   3. Dyslipidemia   4. Degenerative disc disease, lumbar   5. Lateral knee pain, left   6. Need for prophylactic vaccination and inoculation against influenza    -controlled; agree with Jardiance 25mg  daily. Rx provided. -agreeable to switching Atorvastatin to Crestor 10mg  daily; recommend HOLDING Gemfibrozil for the next three months; repeat FLP at next visit. -BP controlled. -nerve stimulator not provided significant pain control of chronic lower back pain; taking Tylenol bid for pain control.  -recommend evaluation by Dr. Janeann Forehand for ongoing L knee pain; pt to schedule appointment if pain persists.    Orders Placed This Encounter  Procedures  . Flu Vaccine QUAD 36+ mos IM  . Comprehensive metabolic panel  . POCT glucose (manual entry)  . POCT glycosylated hemoglobin (Hb A1C)   Meds ordered this encounter  Medications  . rosuvastatin (CRESTOR) 10 MG tablet     Sig: Take 1 tablet (10 mg total) by mouth daily.    Dispense:  30 tablet    Refill:  5    Return in about 3 months (around 11/29/2016) for recheck diabetes, triglycerides.   Delara Shepheard Elayne Guerin, M.D. [facility] [address] [place], [place]  [address] [phone] phone [phone] fax

## 2016-08-29 NOTE — Patient Instructions (Addendum)
  HOLD Gemfibrozil for three months. Start generic Crestor.   IF you received an x-ray today, you will receive an invoice from [facility]. Please contact [facility] at [phone] with questions or concerns regarding your invoice.   IF you received labwork today, you will receive an invoice from [facility] [employer]. Please contact [facility] at [phone] with questions or concerns regarding your invoice.   Our billing staff will not be able to assist you with questions regarding bills from these companies.  You will be contacted with the lab results as soon as they are available. The fastest way to get your results is to activate your My Chart account. Instructions are located on the last page of this paperwork. If you have not heard from [place] regarding the results in 2 weeks, please contact this office.

## 2016-09-03 ENCOUNTER — Encounter: Payer: Self-pay | Admitting: Family Medicine

## 2016-09-03 ENCOUNTER — Telehealth: Payer: Self-pay

## 2016-09-03 DIAGNOSIS — H25813 Combined forms of age-related cataract, bilateral: Secondary | ICD-10-CM | POA: Diagnosis not present

## 2016-09-03 DIAGNOSIS — H401132 Primary open-angle glaucoma, bilateral, moderate stage: Secondary | ICD-10-CM | POA: Diagnosis not present

## 2016-09-03 NOTE — Telephone Encounter (Signed)
[facility] Medicare dept faxed form for DM supplies. Completed and will put in Dr Thompson Caul box for signature.

## 2016-09-22 DIAGNOSIS — S63657D Sprain of metacarpophalangeal joint of left little finger, subsequent encounter: Secondary | ICD-10-CM | POA: Diagnosis not present

## 2016-10-14 DIAGNOSIS — H401133 Primary open-angle glaucoma, bilateral, severe stage: Secondary | ICD-10-CM | POA: Diagnosis not present

## 2016-10-14 DIAGNOSIS — E119 Type 2 diabetes mellitus without complications: Secondary | ICD-10-CM | POA: Diagnosis not present

## 2016-10-14 DIAGNOSIS — H2511 Age-related nuclear cataract, right eye: Secondary | ICD-10-CM | POA: Diagnosis not present

## 2016-10-14 DIAGNOSIS — H2512 Age-related nuclear cataract, left eye: Secondary | ICD-10-CM | POA: Diagnosis not present

## 2016-11-23 DIAGNOSIS — H2511 Age-related nuclear cataract, right eye: Secondary | ICD-10-CM | POA: Diagnosis not present

## 2016-11-23 DIAGNOSIS — H25811 Combined forms of age-related cataract, right eye: Secondary | ICD-10-CM | POA: Diagnosis not present

## 2016-11-23 DIAGNOSIS — H401113 Primary open-angle glaucoma, right eye, severe stage: Secondary | ICD-10-CM | POA: Diagnosis not present

## 2016-12-11 ENCOUNTER — Other Ambulatory Visit: Payer: Self-pay | Admitting: Ophthalmology

## 2016-12-11 DIAGNOSIS — H5340 Unspecified visual field defects: Secondary | ICD-10-CM

## 2017-01-07 DIAGNOSIS — I10 Essential (primary) hypertension: Secondary | ICD-10-CM | POA: Diagnosis not present

## 2017-01-07 DIAGNOSIS — I2510 Atherosclerotic heart disease of native coronary artery without angina pectoris: Secondary | ICD-10-CM | POA: Diagnosis not present

## 2017-01-07 DIAGNOSIS — E119 Type 2 diabetes mellitus without complications: Secondary | ICD-10-CM | POA: Diagnosis not present

## 2017-01-07 DIAGNOSIS — E782 Mixed hyperlipidemia: Secondary | ICD-10-CM | POA: Diagnosis not present

## 2017-01-20 ENCOUNTER — Other Ambulatory Visit: Payer: Self-pay | Admitting: Family Medicine

## 2017-01-22 ENCOUNTER — Encounter: Payer: Self-pay | Admitting: Family Medicine

## 2017-01-22 ENCOUNTER — Telehealth: Payer: Self-pay | Admitting: Family Medicine

## 2017-01-22 DIAGNOSIS — E119 Type 2 diabetes mellitus without complications: Secondary | ICD-10-CM

## 2017-01-22 NOTE — Telephone Encounter (Signed)
Patient is requesting to have lab orders put in prior to his appt. On 02/02/17  Please advise,  Contact phone# [phone]

## 2017-01-23 ENCOUNTER — Other Ambulatory Visit: Payer: Self-pay | Admitting: Emergency Medicine

## 2017-01-23 DIAGNOSIS — E785 Hyperlipidemia, unspecified: Secondary | ICD-10-CM

## 2017-01-23 DIAGNOSIS — E119 Type 2 diabetes mellitus without complications: Secondary | ICD-10-CM

## 2017-01-23 DIAGNOSIS — Z794 Long term (current) use of insulin: Principal | ICD-10-CM

## 2017-01-23 NOTE — Telephone Encounter (Signed)
Dr. Tamala Julian, I placed future orders for Lipid panel and Alc.

## 2017-01-24 NOTE — Telephone Encounter (Signed)
Noted and agree. 

## 2017-01-29 ENCOUNTER — Other Ambulatory Visit: Payer: Self-pay | Admitting: Family Medicine

## 2017-02-02 ENCOUNTER — Ambulatory Visit (INDEPENDENT_AMBULATORY_CARE_PROVIDER_SITE_OTHER): Payer: Medicare Other | Admitting: Family Medicine

## 2017-02-02 ENCOUNTER — Encounter: Payer: Self-pay | Admitting: Family Medicine

## 2017-02-02 VITALS — BP 118/68 | HR 62 | Temp 98.2°F | Resp 16 | Ht 72.0 in | Wt 206.4 lb

## 2017-02-02 DIAGNOSIS — M5136 Other intervertebral disc degeneration, lumbar region: Secondary | ICD-10-CM

## 2017-02-02 DIAGNOSIS — G4733 Obstructive sleep apnea (adult) (pediatric): Secondary | ICD-10-CM | POA: Diagnosis not present

## 2017-02-02 DIAGNOSIS — Z23 Encounter for immunization: Secondary | ICD-10-CM | POA: Diagnosis not present

## 2017-02-02 DIAGNOSIS — I10 Essential (primary) hypertension: Secondary | ICD-10-CM | POA: Diagnosis not present

## 2017-02-02 DIAGNOSIS — E119 Type 2 diabetes mellitus without complications: Secondary | ICD-10-CM

## 2017-02-02 DIAGNOSIS — E785 Hyperlipidemia, unspecified: Secondary | ICD-10-CM | POA: Diagnosis not present

## 2017-02-02 DIAGNOSIS — G43709 Chronic migraine without aura, not intractable, without status migrainosus: Secondary | ICD-10-CM

## 2017-02-02 LAB — POCT GLYCOSYLATED HEMOGLOBIN (HGB A1C): HEMOGLOBIN A1C: 6.3

## 2017-02-02 MED ORDER — EMPAGLIFLOZIN 25 MG PO TABS: 25.0000 mg | ORAL_TABLET | Freq: Every day | ORAL | 3 refills | Status: DC

## 2017-02-02 MED ORDER — ZOSTER VAC RECOMB ADJUVANTED 50 MCG/0.5ML IM SUSR: 50.0000 ug | Freq: Once | INTRAMUSCULAR | 1 refills | Status: AC

## 2017-02-02 MED ORDER — ROSUVASTATIN CALCIUM 10 MG PO TABS: 10.0000 mg | ORAL_TABLET | Freq: Every day | ORAL | 3 refills | Status: DC

## 2017-02-02 MED ORDER — SITAGLIPTIN PHOSPHATE 100 MG PO TABS: 100.0000 mg | ORAL_TABLET | Freq: Every day | ORAL | 3 refills | Status: DC

## 2017-02-02 NOTE — Progress Notes (Signed)
Subjective:    Patient ID: [name], male    DOB: 31-Mar-1946, 71 y.o.   MRN: [id number]  02/02/2017  Follow-up (MEDICATION)   HPI This 71 y.o. male presents for follow-up DMII, hypertension, hypercholesterolemia.  No changes to management made at last visit.   140s fasting.   Suffering with worsening pain; can still walk; cannot stand.  Implantable device corrects neurological symptoms yet not mechanical pain.  Now cannot undergo MRI of lumbar spine for worsening pain.  Taking Tylenol arthritis am and pm.  Sits a lot because less pain.  [place].  Maryjean Ka is pain management specialist.  Does not desire chronic narcotic use or surgery.   Knees have improved B.  Depends on how sleeps in bed.  If sleeps with one leg on top of another.  Dog is dying. Dog is fifteen. Not taking Voltaren gel. Stopped Lopid.   Immunization History  Administered Date(s) Administered  . Influenza, Seasonal, Injecte, Preservative Fre 02/02/2013  . Influenza,inj,Quad PF,36+ Mos 09/05/2013, 11/22/2014, 07/25/2015, 08/29/2016  . Pneumococcal Conjugate-13 11/22/2014  . Pneumococcal Polysaccharide-23 06/03/2016  . Zoster 11/22/2014   BP Readings from Last 3 Encounters:  02/02/17 118/68  08/29/16 110/68  06/03/16 128/82   Wt Readings from Last 3 Encounters:  02/02/17 206 lb 6.4 oz (93.6 kg)  08/29/16 213 lb (96.6 kg)  06/03/16 219 lb (99.3 kg)     Review of Systems  Constitutional: Negative for activity change, appetite change, chills, diaphoresis, fatigue and fever.  Respiratory: Negative for cough and shortness of breath.   Cardiovascular: Negative for chest pain, palpitations and leg swelling.  Gastrointestinal: Negative for abdominal pain, diarrhea, nausea and vomiting.  Endocrine: Negative for cold intolerance, heat intolerance, polydipsia, polyphagia and polyuria.  Genitourinary:       Nocturia x 0.    Musculoskeletal: Positive for arthralgias and back pain.  Skin: Negative for color  change, rash and wound.  Neurological: Negative for dizziness, tremors, seizures, syncope, facial asymmetry, speech difficulty, weakness, light-headedness, numbness and headaches.  Psychiatric/Behavioral: Negative for dysphoric mood and sleep disturbance. The patient is not nervous/anxious.     Past Medical History:  Diagnosis Date  . Arthritis    DDD, scoliosis, stenosis    . 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 Cancer (HCC)    skin ca, posterior R ear  . Chronic kidney disease    Hx of stones  . Complication of anesthesia    difficulty intubation , anterior larynx, easy mask ventilation  . Coronary artery disease   . Diabetes mellitus without complication ([place])   . Difficult intubation   . Diverticulitis   . Glaucoma   . Headache(784.0)    migraines  . Hyperlipemia   . Hypertension   . Sleep apnea    CPAP still in use q night, & for naps thru out the day, pt. reports that last study was neg for sleep apnea but he still uses the device    . Spinal stenosis   . Ventricular dilatation    Right   Past Surgical History:  Procedure Laterality Date  . APPENDECTOMY  1991  . BOWEL RESECTION  1985  . CARDIAC CATHETERIZATION  2006  . CATARACT EXTRACTION Right   . CHOLECYSTECTOMY  1990  . COLON SURGERY     bowel resection, for diverticulitis   . COLONOSCOPY N/A 06/15/2013   Procedure: COLONOSCOPY;  Surgeon: Beryle Beams, MD;  Location: [facility];  Service: Endoscopy;  Laterality: N/A;  .  CORONARY ANGIOPLASTY    . LITHOTRIPSY    . SPINAL CORD STIMULATOR INSERTION N/A 03/20/2016   Procedure: LUMBAR SPINAL CORD STIMULATOR INSERTION;  Surgeon: Clydell Hakim, MD;  Location: [place] [facility];  Service: Neurosurgery;  Laterality: N/A;  . TONSILLECTOMY     Allergies  Allergen Reactions  . Skelaxin [Metaxalone] Rash    Cherry red rash with sloughing off of skin at left upper chest/shoulder  . Amaryl Other (See Comments)    Gives pt migraines   . Hydrocodone     Does not have much affect  . Metformin And Related Diarrhea  . Other Other (See Comments)    Pistachios and lima beans cause migraines  . Levofloxacin Nausea Only  . Lisinopril Other (See Comments)    cough    Social History   Social History  . Marital status: Married    Spouse name: N/A  . Number of children: 0  . Years of education: Masters +   Occupational History  . Retired    Social History Main Topics  . Smoking status: Never Smoker  . Smokeless tobacco: Never Used  . Alcohol use 0.0 oz/week     Comment: seldom   . Drug use: No  . Sexual activity: Yes   Other Topics Concern  . Not on file   Social History Narrative   Marital status: married x 1969      Children: none      Lives: with wife, 1 dog      Employment: retired at age 49.  Education music      Tobacco: none      Alcohol: rare      Exercise: stationary bike in 2018; rare use in 2018 due to sick dog.      ADLs: independent with ADLs; drives      Advanced Directives:    2 cups of coffee a day, soda or tea about 3 times a week.    Family History  Problem Relation Age of Onset  . Heart disease Father   . Heart attack Father   . Heart disease Paternal Grandmother        Objective:    BP 118/68 (BP Location: Right Arm, Patient Position: Sitting, Cuff Size: Large)   Pulse 62   Temp 98.2 F (36.8 C) (Oral)   Resp 16   Ht 6' (1.829 m)   Wt 206 lb 6.4 oz (93.6 kg)   SpO2 93%   BMI 27.99 kg/m  Physical Exam  Constitutional: He is oriented to person, place, and time. He appears well-developed and well-nourished. No distress.  HENT:  Head: Normocephalic and atraumatic.  Right Ear: External ear normal.  Left Ear: External ear normal.  Nose: Nose normal.  Mouth/Throat: Oropharynx is clear and moist.  Eyes: Conjunctivae and EOM are normal. Pupils are equal, round, and reactive to light.  Neck: Normal range of motion. Neck supple. Carotid bruit is not present. No thyromegaly  present.  Cardiovascular: Normal rate, regular rhythm, normal heart sounds and intact distal pulses.  Exam reveals no gallop and no friction rub.   No murmur heard. Pulmonary/Chest: Effort normal and breath sounds normal. He has no wheezes. He has no rales.  Abdominal: Soft. Bowel sounds are normal. He exhibits no distension and no mass. There is no tenderness. There is no rebound and no guarding.  Lymphadenopathy:    He has no cervical adenopathy.  Neurological: He is alert and oriented to person, place, and time. No cranial nerve deficit.  Skin: Skin is warm and dry. No rash noted. He is not diaphoretic.  Psychiatric: He has a normal mood and affect. His behavior is normal.  Nursing note and vitals reviewed.  Results for orders placed or performed in visit on 02/02/17  POCT glycosylated hemoglobin (Hb A1C)  Result Value Ref Range   Hemoglobin A1C 6.3    Depression screen [facility] 2/9 02/02/2017 08/29/2016 06/03/2016 03/26/2016 02/13/2016  Decreased Interest 0 0 0 0 0  Down, Depressed, Hopeless 0 0 0 - 0  PHQ - 2 Score 0 0 0 0 0   Fall Risk  02/02/2017 08/29/2016 06/03/2016 03/26/2016 11/22/2014  Falls in the past year? Yes No Yes No No  Number falls in past yr: 2 or more - 1 - -  Injury with Fall? No - No - -        Assessment & Plan:   1. Type 2 diabetes mellitus without complication, without long-term current use of insulin ([place])   2. Essential hypertension   3. Chronic migraine without aura without status migrainosus, not intractable   4. Dyslipidemia   5. Obstructive sleep apnea   6. Degenerative disc disease, lumbar   7. Controlled type 2 diabetes mellitus without complication, without long-term current use of insulin ([place])    -controlled diabetes, HTN, dyslipidemia; refills provided.  Obtain labs for chronic disease management. -suffering with ongoing chronic daily pain from DDD lumbar spine; managed by pain management.  -shingrix rx provided.   Orders Placed This Encounter   Procedures  . Comprehensive metabolic panel    Order Specific Question:   Has the patient fasted?    Answer:   Yes  . Lipid panel    Order Specific Question:   Has the patient fasted?    Answer:   Yes  . POCT glycosylated hemoglobin (Hb A1C)   Meds ordered this encounter  Medications  . brinzolamide (AZOPT) 1 % ophthalmic suspension    Sig: 1 drop 2 (two) times daily.  Marland Kitchen Zoster Vac Recomb Adjuvanted (SHINGRIX) 50 MCG SUSR    Sig: Inject 50 mcg into the muscle once.    Dispense:  1 each    Refill:  1  . empagliflozin (JARDIANCE) 25 MG TABS tablet    Sig: Take 25 mg by mouth daily.    Dispense:  90 tablet    Refill:  3  . sitaGLIPtin (JANUVIA) 100 MG tablet    Sig: Take 1 tablet (100 mg total) by mouth daily.    Dispense:  90 tablet    Refill:  3  . rosuvastatin (CRESTOR) 10 MG tablet    Sig: Take 1 tablet (10 mg total) by mouth daily.    Dispense:  90 tablet    Refill:  3    Return in about 6 months (around 08/05/2017) for complete physical examiniation.   Javaya [place] Elayne Guerin, M.D. Primary Care at [facility] previously [facility] [address] [place], [place]  [address] [phone] phone [phone] fax

## 2017-02-02 NOTE — Patient Instructions (Addendum)
IF you received an x-ray today, you will receive an invoice from [facility]. Please contact [facility] at [phone] with questions or concerns regarding your invoice.   IF you received labwork today, you will receive an invoice from [facility]. Please contact [facility] at [phone] with questions or concerns regarding your invoice.   Our billing staff will not be able to assist you with questions regarding bills from these companies.  You will be contacted with the lab results as soon as they are available. The fastest way to get your results is to activate your My Chart account. Instructions are located on the last page of this paperwork. If you have not heard from [place] regarding the results in 2 weeks, please contact this office.     1 Carbohydrate Counting for Diabetes Mellitus, Adult Carbohydrate counting is a method for keeping track of how many carbohydrates you eat. Eating carbohydrates naturally increases the amount of sugar (glucose) in the blood. Counting how many carbohydrates you eat helps keep your blood glucose within normal limits, which helps you manage your diabetes (diabetes mellitus). It is important to know how many carbohydrates you can safely have in each meal. This is different for every person. A diet and nutrition specialist (registered dietitian) can help you make a meal plan and calculate how many carbohydrates you should have at each meal and snack. Carbohydrates are found in the following foods:  Grains, such as breads and cereals.  Dried beans and soy products.  Starchy vegetables, such as potatoes, peas, and corn.  Fruit and fruit juices.  Milk and yogurt.  Sweets and snack foods, such as cake, cookies, candy, chips, and soft drinks. How do I count carbohydrates? There are two ways to count carbohydrates in food. You can use either of the methods or a combination of both. Reading "Nutrition Facts" on packaged food  The  "Nutrition Facts" list is included on the labels of almost all packaged foods and beverages in the [place] It includes:  The serving size.  Information about nutrients in each serving, including the grams (g) of carbohydrate per serving. To use the "Nutrition Facts":  Decide how many servings you will have.  Multiply the number of servings by the number of carbohydrates per serving.  The resulting number is the total amount of carbohydrates that you will be having. Learning standard serving sizes of other foods  When you eat foods containing carbohydrates that are not packaged or do not include "Nutrition Facts" on the label, you need to measure the servings in order to count the amount of carbohydrates:  Measure the foods that you will eat with a food scale or measuring cup, if needed.  Decide how many standard-size servings you will eat.  Multiply the number of servings by 15. Most carbohydrate-rich foods have about 15 g of carbohydrates per serving.  For example, if you eat 8 oz (170 g) of strawberries, you will have eaten 2 servings and 30 g of carbohydrates (2 servings x 15 g = 30 g).  For foods that have more than one food mixed, such as soups and casseroles, you must count the carbohydrates in each food that is included. The following list contains standard serving sizes of common carbohydrate-rich foods. Each of these servings has about 15 g of carbohydrates:   hamburger bun or  English muffin.   oz (15 mL) syrup.   oz (14 g) jelly.  1 slice of bread.  1 six-inch tortilla.  3  oz (85 g) cooked rice or pasta.  4 oz (113 g) cooked dried beans.  4 oz (113 g) starchy vegetable, such as peas, corn, or potatoes.  4 oz (113 g) hot cereal.  4 oz (113 g) mashed potatoes or  of a large baked potato.  4 oz (113 g) canned or frozen fruit.  4 oz (120 mL) fruit juice.  4-6 crackers.  6 chicken nuggets.  6 oz (170 g) unsweetened dry cereal.  6 oz (170 g) plain  fat-free yogurt or yogurt sweetened with artificial sweeteners.  8 oz (240 mL) milk.  8 oz (170 g) fresh fruit or one small piece of fruit.  24 oz (680 g) popped popcorn. Example of carbohydrate counting Sample meal   3 oz (85 g) chicken breast.  6 oz (170 g) brown rice.  4 oz (113 g) corn.  8 oz (240 mL) milk.  8 oz (170 g) strawberries with sugar-free whipped topping. Carbohydrate calculation  1. Identify the foods that contain carbohydrates:  Rice.  Corn.  Milk.  Strawberries. 2. Calculate how many servings you have of each food:  2 servings rice.  1 serving corn.  1 serving milk.  1 serving strawberries. 3. Multiply each number of servings by 15 g:  2 servings rice x 15 g = 30 g.  1 serving corn x 15 g = 15 g.  1 serving milk x 15 g = 15 g.  1 serving strawberries x 15 g = 15 g. 4. Add together all of the amounts to find the total grams of carbohydrates eaten:  30 g + 15 g + 15 g + 15 g = 75 g of carbohydrates total. This information is not intended to replace advice given to you by your health care provider. Make sure you discuss any questions you have with your health care provider. Document Released: 11/02/2005 Document Revised: 05/22/2016 Document Reviewed: 04/15/2016 Elsevier Interactive Patient Education  2017 Elsevier Inc.  

## 2017-02-03 LAB — COMPREHENSIVE METABOLIC PANEL
A/G RATIO: 2 (ref 1.2–2.2)
ALT: 26 IU/L (ref 0–44)
AST: 25 IU/L (ref 0–40)
Albumin: 4.6 g/dL (ref 3.5–4.8)
Alkaline Phosphatase: 58 IU/L (ref 39–117)
BILIRUBIN TOTAL: 0.6 mg/dL (ref 0.0–1.2)
BUN/Creatinine Ratio: 20 (ref 10–24)
BUN: 18 mg/dL (ref 8–27)
CALCIUM: 10.8 mg/dL — AB (ref 8.6–10.2)
CO2: 31 mmol/L — ABNORMAL HIGH (ref 18–29)
Chloride: 98 mmol/L (ref 96–106)
Creatinine, Ser: 0.88 mg/dL (ref 0.76–1.27)
GFR, EST AFRICAN AMERICAN: 101 mL/min/{1.73_m2} (ref >59)
GFR, EST NON AFRICAN AMERICAN: 87 mL/min/{1.73_m2} (ref >59)
GLOBULIN, TOTAL: 2.3 g/dL (ref 1.5–4.5)
Glucose: 107 mg/dL — ABNORMAL HIGH (ref 65–99)
POTASSIUM: 3.5 mmol/L (ref 3.5–5.2)
SODIUM: 143 mmol/L (ref 134–144)
TOTAL PROTEIN: 6.9 g/dL (ref 6.0–8.5)

## 2017-02-03 LAB — LIPID PANEL
CHOL/HDL RATIO: 4.4 ratio (ref 0.0–5.0)
Cholesterol, Total: 145 mg/dL (ref 100–199)
HDL: 33 mg/dL — ABNORMAL LOW (ref >39)
LDL Calculated: 78 mg/dL (ref 0–99)
Triglycerides: 168 mg/dL — ABNORMAL HIGH (ref 0–149)
VLDL Cholesterol Cal: 34 mg/dL (ref 5–40)

## 2017-02-16 ENCOUNTER — Other Ambulatory Visit: Payer: Self-pay | Admitting: Family Medicine

## 2017-02-16 DIAGNOSIS — M546 Pain in thoracic spine: Secondary | ICD-10-CM | POA: Diagnosis not present

## 2017-02-16 DIAGNOSIS — M48062 Spinal stenosis, lumbar region with neurogenic claudication: Secondary | ICD-10-CM | POA: Diagnosis not present

## 2017-02-18 ENCOUNTER — Other Ambulatory Visit: Payer: Self-pay | Admitting: Anesthesiology

## 2017-02-18 DIAGNOSIS — M48062 Spinal stenosis, lumbar region with neurogenic claudication: Secondary | ICD-10-CM

## 2017-03-01 ENCOUNTER — Ambulatory Visit
Admission: RE | Admit: 2017-03-01 | Discharge: 2017-03-01 | Disposition: A | Discharge status: Home / Self Care | Payer: Medicare Other | Source: Ambulatory Visit | Attending: Anesthesiology | Admitting: Anesthesiology

## 2017-03-01 VITALS — BP 95/44 | HR 54

## 2017-03-01 DIAGNOSIS — M48062 Spinal stenosis, lumbar region with neurogenic claudication: Secondary | ICD-10-CM

## 2017-03-01 DIAGNOSIS — M5136 Other intervertebral disc degeneration, lumbar region: Secondary | ICD-10-CM

## 2017-03-01 DIAGNOSIS — M48061 Spinal stenosis, lumbar region without neurogenic claudication: Secondary | ICD-10-CM | POA: Diagnosis not present

## 2017-03-01 MED ORDER — DIAZEPAM 5 MG PO TABS
5.0000 mg | ORAL_TABLET | Freq: Once | ORAL | Status: AC
  Administered 2017-03-01: 5 mg via ORAL

## 2017-03-01 MED ORDER — IOPAMIDOL (ISOVUE-M 200) INJECTION 41%
15.0000 mL | Freq: Once | INTRAMUSCULAR | Status: AC
  Administered 2017-03-01: 15 mL via INTRATHECAL

## 2017-03-01 NOTE — Progress Notes (Signed)
Patient states he has been off Sumatriptan for at least the past two days.  Brita Romp, RN

## 2017-03-01 NOTE — Discharge Instructions (Signed)
Myelogram Discharge Instructions  1. Go home and rest quietly for the next 24 hours.  It is important to lie flat for the next 24 hours.  Get up only to go to the restroom.  You may lie in the bed or on a couch on your back, your stomach, your left side or your right side.  You may have one pillow under your head.  You may have pillows between your knees while you are on your side or under your knees while you are on your back.  2. DO NOT drive today.  Recline the seat as far back as it will go, while still wearing your seat belt, on the way home.  3. You may get up to go to the bathroom as needed.  You may sit up for 10 minutes to eat.  You may resume your normal diet and medications unless otherwise indicated.  Drink lots of extra fluids today and tomorrow.  4. The incidence of headache, nausea, or vomiting is about 5% (one in 20 patients).  If you develop a headache, lie flat and drink plenty of fluids until the headache goes away.  Caffeinated beverages may be helpful.  If you develop severe nausea and vomiting or a headache that does not go away with flat bed rest, call [phone].  5. You may resume normal activities after your 24 hours of bed rest is over; however, do not exert yourself strongly or do any heavy lifting tomorrow. If when you get up you have a headache when standing, go back to bed and force fluids for another 24 hours.  6. Call your physician for a follow-up appointment.  The results of your myelogram will be sent directly to your physician by the following day.  7. If you have any questions or if complications develop after you arrive home, please call [phone].  Discharge instructions have been explained to the patient.  The patient, or the person responsible for the patient, fully understands these instructions.       May resume Sumatriptan on March 02, 2017, after 1:00 pm.

## 2017-03-04 ENCOUNTER — Telehealth: Payer: Self-pay

## 2017-03-04 ENCOUNTER — Other Ambulatory Visit: Payer: Self-pay | Admitting: Family Medicine

## 2017-03-04 DIAGNOSIS — R51 Headache: Principal | ICD-10-CM

## 2017-03-04 DIAGNOSIS — R519 Headache, unspecified: Secondary | ICD-10-CM

## 2017-03-04 NOTE — Telephone Encounter (Signed)
Returned patient's call, and he asked if rectal bleeding was associated with the myelogram he had here Monday.  I told him it was not, and he said, "Thanks, I guess I'll call my gastroenterologist."  Brita Romp, RN

## 2017-03-05 DIAGNOSIS — S63657D Sprain of metacarpophalangeal joint of left little finger, subsequent encounter: Secondary | ICD-10-CM | POA: Diagnosis not present

## 2017-03-08 DIAGNOSIS — K625 Hemorrhage of anus and rectum: Secondary | ICD-10-CM | POA: Diagnosis not present

## 2017-03-08 DIAGNOSIS — K5730 Diverticulosis of large intestine without perforation or abscess without bleeding: Secondary | ICD-10-CM | POA: Diagnosis not present

## 2017-03-09 DIAGNOSIS — M48062 Spinal stenosis, lumbar region with neurogenic claudication: Secondary | ICD-10-CM | POA: Diagnosis not present

## 2017-03-15 DIAGNOSIS — M48061 Spinal stenosis, lumbar region without neurogenic claudication: Secondary | ICD-10-CM | POA: Diagnosis not present

## 2017-03-15 DIAGNOSIS — M4316 Spondylolisthesis, lumbar region: Secondary | ICD-10-CM | POA: Diagnosis not present

## 2017-03-16 ENCOUNTER — Other Ambulatory Visit: Payer: Self-pay | Admitting: Neurological Surgery

## 2017-03-18 ENCOUNTER — Ambulatory Visit (INDEPENDENT_AMBULATORY_CARE_PROVIDER_SITE_OTHER): Payer: Medicare Other | Admitting: Physician Assistant

## 2017-03-18 ENCOUNTER — Encounter: Payer: Self-pay | Admitting: Physician Assistant

## 2017-03-18 VITALS — BP 128/71 | HR 69 | Temp 98.3°F | Resp 17 | Ht 71.5 in | Wt 208.0 lb

## 2017-03-18 DIAGNOSIS — L02215 Cutaneous abscess of perineum: Secondary | ICD-10-CM

## 2017-03-18 MED ORDER — MUPIROCIN 2 % EX OINT
1.0000 | TOPICAL_OINTMENT | Freq: Two times a day (BID) | CUTANEOUS | 0 refills | Status: DC
Start: 2017-03-18 — End: 2018-03-09

## 2017-03-18 MED ORDER — DOXYCYCLINE HYCLATE 100 MG PO TABS: 100.0000 mg | ORAL_TABLET | Freq: Two times a day (BID) | ORAL | 0 refills | Status: DC

## 2017-03-18 NOTE — Patient Instructions (Addendum)
  Continue using the antibiotic ointment on the area  Take all the doxycycline.  Warm compresses   IF you received an x-ray today, you will receive an invoice from [facility]. Please contact [facility] at [phone] with questions or concerns regarding your invoice.   IF you received labwork today, you will receive an invoice from [facility]. Please contact [facility] at [phone] with questions or concerns regarding your invoice.   Our billing staff will not be able to assist you with questions regarding bills from these companies.  You will be contacted with the lab results as soon as they are available. The fastest way to get your results is to activate your My Chart account. Instructions are located on the last page of this paperwork. If you have not heard from [place] regarding the results in 2 weeks, please contact this office.

## 2017-03-18 NOTE — Progress Notes (Signed)
[name]  MRN: [id number] DOB: 07-Jan-1946  PCP: Reginia Forts, MD  Chief Complaint  Patient presents with  . cyst    Subjective:  Pt presents to clinic for likely infected cyst that he noticed yesterday afternoon.  He has had problem with sebaceous cysts formation in the past.  Last night it was the worse and then this am it seems a little better but he is concerned because he has multiple hours he has to sit in the upcoming days.  Review of Systems  Constitutional: Negative for chills and fever.    Patient Active Problem List   Diagnosis Date Noted  . Type 2 diabetes mellitus without complication, without long-term current use of insulin ([place]) 02/14/2016  . Degenerative disc disease, lumbar 02/14/2016  . HTN (hypertension) 02/22/2012  . Dyslipidemia 02/22/2012  . Obstructive sleep apnea 10/14/2010  . Migraine headache 10/14/2010  . GLAUCOMA 10/14/2010  . CAD 10/14/2010  . DIVERTICULAR DISEASE 10/14/2010    Current Outpatient Prescriptions on File Prior to Visit  Medication Sig Dispense Refill  . acetaminophen (TYLENOL) 650 MG CR tablet Take 650 mg by mouth 2 (two) times daily.    Marland Kitchen amLODipine (NORVASC) 10 MG tablet TAKE 1 TABLET BY MOUTH DAILY 90 tablet 0  . aspirin 81 MG tablet Take 81 mg by mouth at bedtime.     . bimatoprost (LUMIGAN) 0.03 % ophthalmic solution Place 1 drop into both eyes at bedtime. Reported on 01/01/2016    . brimonidine-timolol (COMBIGAN) 0.2-0.5 % ophthalmic solution Place 1 drop into both eyes 2 (two) times daily.    . brinzolamide (AZOPT) 1 % ophthalmic suspension 1 drop 2 (two) times daily.    . chlorthalidone (HYGROTON) 25 MG tablet TAKE 1 TABLET(25 MG) BY MOUTH DAILY 90 tablet 0  . Cholecalciferol (VITAMIN D-3 PO) Take 5,000 Units by mouth 2 (two) times a week.     . empagliflozin (JARDIANCE) 25 MG TABS tablet Take 25 mg by mouth daily. 90 tablet 3  . fish oil-omega-3 fatty acids 1000 MG capsule Take 2 g by mouth daily.    Marland Kitchen glucose  blood (FREESTYLE LITE) test strip Test blood sugar once daily. Dx: E11.9 100 each 3  . Lancets (FREESTYLE) lancets Test blood sugar once daily. Dx: E11.9 100 each 3  . metoprolol tartrate (LOPRESSOR) 25 MG tablet Take 25 mg by mouth 2 (two) times daily.    . Multiple Vitamins-Minerals (PRESERVISION AREDS 2) CAPS Take 1 capsule by mouth 2 (two) times daily.    Marland Kitchen OVER THE COUNTER MEDICATION Apply 1 application topically 2 (two) times daily. Reported on 03/12/2016    . rosuvastatin (CRESTOR) 10 MG tablet Take 1 tablet (10 mg total) by mouth daily. 90 tablet 3  . sitaGLIPtin (JANUVIA) 100 MG tablet Take 1 tablet (100 mg total) by mouth daily. 90 tablet 3  . SUMAtriptan (IMITREX) 50 MG tablet TAKE 1 TABLET BY MOUTH EVERY 2 HOURS AS NEEDED FOR MIGRAINE 9 tablet 3   No current facility-administered medications on file prior to visit.     Allergies  Allergen Reactions  . Amaryl Other (See Comments)    Gives pt migraines  . Other Other (See Comments)    Pistachios and lima beans cause migraines  . Skelaxin [Metaxalone] Rash    Cherry red rash with sloughing off of skin at left upper chest/shoulder  . Levofloxacin Nausea Only  . Lisinopril Other (See Comments)    cough  . Metformin And Related Diarrhea  Pt patients past, family and social history were reviewed and updated.   Objective:  BP 128/71   Pulse 69   Temp 98.3 F (36.8 C) (Oral)   Resp 17   Ht 5' 11.5" (1.816 m)   Wt 208 lb (94.3 kg)   SpO2 97%   BMI 28.61 kg/m   Physical Exam  Constitutional: He is oriented to person, place, and time and well-developed, well-nourished, and in no distress.  HENT:  Head: Normocephalic and atraumatic.  Right Ear: External ear normal.  Left Ear: External ear normal.  Eyes: Conjunctivae are normal.  Neck: Normal range of motion.  Pulmonary/Chest: Effort normal.  Neurological: He is alert and oriented to person, place, and time. Gait normal.  Skin: Skin is warm and dry.  Just superior to  the scrotum on the perineal space a 0.5cm erythematous pustule which is TTP no surrounding induration  Psychiatric: Mood, memory, affect and judgment normal.   Procedure: ETOH swab - Ethyl chloride to the area - #1 gauge used to open the wound - purulence expressed  Assessment and Plan :  Perineal abscess, superficial - Plan: mupirocin ointment (BACTROBAN) 2 %, doxycycline (VIBRA-TABS) 100 MG tablet wound care d/w pt - warm compresses and finish all abx.  Windell Hummingbird PA-C  [facility] 03/18/2017 9:40 AM

## 2017-04-01 ENCOUNTER — Encounter (HOSPITAL_COMMUNITY): Payer: Self-pay

## 2017-04-01 ENCOUNTER — Encounter (HOSPITAL_COMMUNITY)
Admission: RE | Admit: 2017-04-01 | Discharge: 2017-04-01 | Disposition: A | Discharge status: Home / Self Care | Payer: Medicare Other | Source: Ambulatory Visit | Attending: Neurological Surgery | Admitting: Neurological Surgery

## 2017-04-01 ENCOUNTER — Ambulatory Visit (HOSPITAL_COMMUNITY)
Admission: RE | Admit: 2017-04-01 | Discharge: 2017-04-01 | Disposition: A | Discharge status: Home / Self Care | Payer: Medicare Other | Source: Ambulatory Visit | Attending: Neurological Surgery | Admitting: Neurological Surgery

## 2017-04-01 DIAGNOSIS — M4316 Spondylolisthesis, lumbar region: Secondary | ICD-10-CM | POA: Insufficient documentation

## 2017-04-01 DIAGNOSIS — Z79899 Other long term (current) drug therapy: Secondary | ICD-10-CM | POA: Insufficient documentation

## 2017-04-01 DIAGNOSIS — M4310 Spondylolisthesis, site unspecified: Secondary | ICD-10-CM

## 2017-04-01 DIAGNOSIS — R001 Bradycardia, unspecified: Secondary | ICD-10-CM | POA: Diagnosis not present

## 2017-04-01 DIAGNOSIS — J449 Chronic obstructive pulmonary disease, unspecified: Secondary | ICD-10-CM | POA: Diagnosis not present

## 2017-04-01 DIAGNOSIS — Z7982 Long term (current) use of aspirin: Secondary | ICD-10-CM | POA: Insufficient documentation

## 2017-04-01 DIAGNOSIS — Z01810 Encounter for preprocedural cardiovascular examination: Secondary | ICD-10-CM | POA: Diagnosis not present

## 2017-04-01 DIAGNOSIS — J441 Chronic obstructive pulmonary disease with (acute) exacerbation: Secondary | ICD-10-CM | POA: Diagnosis not present

## 2017-04-01 DIAGNOSIS — Z01818 Encounter for other preprocedural examination: Secondary | ICD-10-CM | POA: Insufficient documentation

## 2017-04-01 HISTORY — DX: Hypercalcemia: E83.52

## 2017-04-01 HISTORY — DX: Personal history of urinary calculi: Z87.442

## 2017-04-01 LAB — TYPE AND SCREEN
ABO/RH(D): O NEG
ANTIBODY SCREEN: NEGATIVE

## 2017-04-01 LAB — CBC WITH DIFFERENTIAL/PLATELET
BASOS ABS: 0 10*3/uL (ref 0.0–0.1)
BASOS PCT: 1 %
Eosinophils Absolute: 0.1 10*3/uL (ref 0.0–0.7)
Eosinophils Relative: 1 %
HEMATOCRIT: 44.7 % (ref 39.0–52.0)
HEMOGLOBIN: 16.1 g/dL (ref 13.0–17.0)
LYMPHS PCT: 30 %
Lymphs Abs: 1.9 10*3/uL (ref 0.7–4.0)
MCH: 32.8 pg (ref 26.0–34.0)
MCHC: 36 g/dL (ref 30.0–36.0)
MCV: 91 fL (ref 78.0–100.0)
MONO ABS: 0.5 10*3/uL (ref 0.1–1.0)
Monocytes Relative: 8 %
NEUTROS ABS: 3.9 10*3/uL (ref 1.7–7.7)
NEUTROS PCT: 60 %
Platelets: 153 10*3/uL (ref 150–400)
RBC: 4.91 MIL/uL (ref 4.22–5.81)
RDW: 13.8 % (ref 11.5–15.5)
WBC: 6.4 10*3/uL (ref 4.0–10.5)

## 2017-04-01 LAB — ABO/RH: ABO/RH(D): O NEG

## 2017-04-01 LAB — PROTIME-INR
INR: 1.02
Prothrombin Time: 13.4 seconds (ref 11.4–15.2)

## 2017-04-01 LAB — BASIC METABOLIC PANEL
ANION GAP: 11 (ref 5–15)
BUN: 18 mg/dL (ref 6–20)
CALCIUM: 10.8 mg/dL — AB (ref 8.9–10.3)
CO2: 28 mmol/L (ref 22–32)
Chloride: 102 mmol/L (ref 101–111)
Creatinine, Ser: 0.89 mg/dL (ref 0.61–1.24)
Glucose, Bld: 135 mg/dL — ABNORMAL HIGH (ref 65–99)
POTASSIUM: 3.2 mmol/L — AB (ref 3.5–5.1)
SODIUM: 141 mmol/L (ref 135–145)

## 2017-04-01 LAB — SURGICAL PCR SCREEN
MRSA, PCR: NEGATIVE
STAPHYLOCOCCUS AUREUS: NEGATIVE

## 2017-04-01 LAB — GLUCOSE, CAPILLARY: Glucose-Capillary: 190 mg/dL — ABNORMAL HIGH (ref 65–99)

## 2017-04-01 NOTE — Progress Notes (Signed)
Spoke with Levada Dy who is aware of patient's anesthesia history.  Requested anesthesia records  [id number].

## 2017-04-01 NOTE — Pre-Procedure Instructions (Signed)
[name]  04/01/2017      [facility] - [place], [place] [address] Phone: [phone] Fax: [phone]  [facility] [place], [place] [address] [place] [address] Phone: [phone] Fax: [phone]  [id number] [place], [place] [address] Phone: [phone] Fax: [phone]  [facility] [place], [place] - [address] [place] [address] Phone: [phone] Fax: [phone]    Your procedure is scheduled on   Friday  04/09/17  Report to [facility] at 530 A.M.  Call this number if you have problems the morning of surgery:  [phone]   Remember:  Do not eat food or drink liquids after midnight.  Take these medicines the morning of surgery with A SIP OF WATER  TYLENOL IF NEEDED, EYE DROPS, CHLORTHALIDONE (HYGROTON), FAMOTIDINE, METOPROLOL (LOPRESSOR), IMITREX IF NEEDED     7 days prior to surgery STOP taking any Aspirin, Aleve, Naproxen, Ibuprofen, Motrin, Advil, Goody's, BC's, all herbal medications, fish oil, and all vitamins  Do not wear jewelry, make-up or nail polish.  Do not wear lotions, powders, or perfumes, or deoderant.  Do not shave 48 hours prior to surgery.  Men may shave face and neck.  Do not bring valuables to the hospital.  [facility] is not responsible for any belongings or valuables.    How to Manage Your Diabetes Before and After Surgery  Why is it important to control my blood sugar before and after surgery? . Improving blood sugar levels before and after surgery helps healing and can limit problems. . A way of improving blood sugar control is eating a healthy diet by: o  Eating less sugar and carbohydrates o  Increasing activity/exercise o  Talking with your  doctor about reaching your blood sugar goals . High blood sugars (greater than 180 mg/dL) can raise your risk of infections and slow your recovery, so you will need to focus on controlling your diabetes during the weeks before surgery. . Make sure that the doctor who takes care of your diabetes knows about your planned surgery including the date and location.  How do I manage my blood sugar before surgery? . Check your blood sugar at least 4 times a day, starting 2 days before surgery, to make sure that the level is not too high or low. o Check your blood sugar the morning of your surgery when you wake up and every 2 hours until you get to the Short Stay unit. . If your blood sugar is less than 70 mg/dL, you will need to treat for low blood sugar: o Do not take insulin. o Treat a low blood sugar (less than 70 mg/dL) with  cup of clear juice (cranberry or apple), 4 glucose tablets, OR glucose gel. o Recheck blood sugar in 15 minutes after treatment (to make sure it is greater than 70 mg/dL). If your blood sugar is not greater than 70 mg/dL on recheck, call [phone] for further instructions. . Report your blood sugar to the short stay nurse when you get to Short Stay.  . If you are admitted to the hospital after surgery: o Your blood sugar will be checked by the staff and you will probably be given insulin  after surgery (instead of oral diabetes medicines) to make sure you have good blood sugar levels. o The goal for blood sugar control after surgery is 80-180 mg/dL.              WHAT DO I DO ABOUT MY DIABETES MEDICATION?   Marland Kitchen Do not take oral diabetes medicines (pills) the morning of surgery.  .       .   .   .   Other Instructions:          Patient Signature:  Date:   Nurse Signature:  Date:   Reviewed and Endorsed by [facility] Patient Education Committee, August 2015 Contacts, dentures or bridgework may not be worn into surgery.  Leave your suitcase in the  car.  After surgery it may be brought to your room.  For patients admitted to the hospital, discharge time will be determined by your treatment team.  Patients discharged the day of surgery will not be allowed to drive home.   Name and phone number of your driver:    Special instructions:  [facility] - Preparing for Surgery  Before surgery, you can play an important role.  Because skin is not sterile, your skin needs to be as free of germs as possible.  You can reduce the number of germs on you skin by washing with CHG (chlorahexidine gluconate) soap before surgery.  CHG is an antiseptic cleaner which kills germs and bonds with the skin to continue killing germs even after washing.  Please DO NOT use if you have an allergy to CHG or antibacterial soaps.  If your skin becomes reddened/irritated stop using the CHG and inform your nurse when you arrive at Short Stay.  Do not shave (including legs and underarms) for at least 48 hours prior to the first CHG shower.  You may shave your face.  Please follow these instructions carefully:   1.  Shower with CHG Soap the night before surgery and the                                morning of Surgery.  2.  If you choose to wash your hair, wash your hair first as usual with your       normal shampoo.  3.  After you shampoo, rinse your hair and body thoroughly to remove the                      Shampoo.  4.  Use CHG as you would any other liquid soap.  You can apply chg directly       to the skin and wash gently with scrungie or a clean washcloth.  5.  Apply the CHG Soap to your body ONLY FROM THE NECK DOWN.        Do not use on open wounds or open sores.  Avoid contact with your eyes,       ears, mouth and genitals (private parts).  Wash genitals (private parts)       with your normal soap.  6.  Wash thoroughly, paying special attention to the area where your surgery        will be performed.  7.  Thoroughly rinse your body with warm water from the neck  down.  8.  DO NOT shower/wash with your normal soap after using and rinsing off       the CHG Soap.  9.  Pat yourself dry with a clean towel.            10.  Wear clean pajamas.            11.  Place clean sheets on your bed the night of your first shower and do not        sleep with pets.  Day of Surgery  Do not apply any lotions/deoderants the morning of surgery.  Please wear clean clothes to the hospital/surgery center.    Please read over the following fact sheets that you were given. Pain Booklet, MRSA Information and Surgical Site Infection Prevention

## 2017-04-02 LAB — HEMOGLOBIN A1C
Hgb A1c MFr Bld: 6.3 % — ABNORMAL HIGH (ref 4.8–5.6)
Mean Plasma Glucose: 134 mg/dL

## 2017-04-02 NOTE — Progress Notes (Signed)
Anesthesia Chart Review:  Pt is a 71 year old male scheduled for L2-3, L3-4, L4-5 laminectomy with L5-S1 posterior lateral fusion on 04/09/2017 with Sherley Bounds, M.D.  - PCP is Reginia Forts, MD - Cardiologist is Adrian Prows, MD who has cleared pt for surgery.   PMH includes: CAD (DES to RCA 2006), HTN, DM, hyperlipidemia, OSA, glaucoma. Never smoker. BMI 28. 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  Anesthesia history includes: Difficult intubation.  - Patient carries a card from surgery in [place] in 2001 that notes patient is a difficult intubation, has an anterior larynx: easy mask ventilation. Attempting to get anesthesia records.  - Anesthesia record in [place] 03/20/16 (for procedure with MAC) notes "Pt obstucts airway with small amounts of sedation. Jaw thrusts performed throughout case. Nasal airway placed by Dr. Linna Caprice. Small amount of bloody drainage noted. Trumpet not very helpful in maintaining patent airway. Precedex bolus given to help sedate patient. Sats maintained above 90% throughout case."  Medications include: Amlodipine, ASA 81 mg, chlorthalidone, empagliflozin, Pepcid, metoprolol, rosuvastatin, sitagliptin  Preoperative labs reviewed.  HbA1c 6.3, glucose 135  CXR 04/01/17: COPD.  No active disease.  EKG 04/01/17: Sinus bradycardia (57 bpm). Cannot rule out Inferior infarct, age undetermined. Appears stable when compared with EKG 01/03/16 (in correspondence in media tab 03/24/16)  Echo 12/11/13 (in correspondence in media tab 03/24/16):  1. LV cavity normal in size. Mild LVH. Mild septal notching. Normal global wall motion. Normal systolic global function. EF 58%. Grade I diastolic dysfunction 2. LA and RA moderately dilated 3. RV cavity mildly enlarged. Mild global hypokinesis 4. Mild calcification of mitral annulus. Mild MR 5. Mild TR. Mild pulmonary HTN  Exercise stress test 11/27/13 (in correspondence in media tab 03/24/16):  - No evidence of ischemia.  - RV was  prominent and suggests RVH or dilatation. Normal wall motion and endocardial thickening.  - LV EF 73%.  - Low risk study  Willeen Cass, FNP-BC [facility] Short Stay Surgical Center/Anesthesiology Phone: [phone] 04/02/2017 11:45 AM

## 2017-04-06 DIAGNOSIS — H401133 Primary open-angle glaucoma, bilateral, severe stage: Secondary | ICD-10-CM | POA: Diagnosis not present

## 2017-04-08 ENCOUNTER — Encounter: Payer: Self-pay | Admitting: Family Medicine

## 2017-04-08 ENCOUNTER — Encounter (HOSPITAL_COMMUNITY): Payer: Self-pay | Admitting: Anesthesiology

## 2017-04-08 NOTE — Progress Notes (Signed)
Followed up on requested previous anesthesia records from [facility], surgery date 11/19/1999.  Records are unavailable.  Angela with anesthesia notified.

## 2017-04-08 NOTE — Anesthesia Preprocedure Evaluation (Addendum)
Anesthesia Evaluation  Patient identified by MRN, date of birth, ID band Patient awake    Reviewed: Allergy & Precautions, NPO status , Patient's Chart, lab work & pertinent test results, reviewed documented beta blocker date and time   History of Anesthesia Complications (+) DIFFICULT AIRWAY and history of anesthetic complications  Airway Mallampati: II  TM Distance: >3 FB Neck ROM: Full    Dental no notable dental hx. (+) Teeth Intact, Loose,    Pulmonary sleep apnea and Continuous Positive Airway Pressure Ventilation ,    Pulmonary exam normal breath sounds clear to auscultation       Cardiovascular hypertension, Pt. on medications and Pt. on home beta blockers + angina with exertion + CAD  Normal cardiovascular exam Rhythm:Regular Rate:Normal     Neuro/Psych  Headaches, Spinal cord stimulator negative psych ROS   GI/Hepatic negative GI ROS, Neg liver ROS,   Endo/Other  diabetes, Well Controlled, Type 2, Oral Hypoglycemic Agentshyperlipidemia  Renal/GU Hx/o renal calculi   negative genitourinary   Musculoskeletal  (+) Arthritis , Osteoarthritis,  Hx/o spinal stenosis Spondylolisthesis lumbar spine    Abdominal   Peds  Hematology   Anesthesia Other Findings   Reproductive/Obstetrics                            Anesthesia Physical Anesthesia Plan  ASA: III  Anesthesia Plan: General   Post-op Pain Management:    Induction: Intravenous  Airway Management Planned: Oral ETT and Video Laryngoscope Planned  Additional Equipment:   Intra-op Plan:   Post-operative Plan: Extubation in OR  Informed Consent: I have reviewed the patients History and Physical, chart, labs and discussed the procedure including the risks, benefits and alternatives for the proposed anesthesia with the patient or authorized representative who has indicated his/her understanding and acceptance.   Dental  advisory given  Plan Discussed with: CRNA, Anesthesiologist and Surgeon  Anesthesia Plan Comments:        Anesthesia Quick Evaluation

## 2017-04-09 ENCOUNTER — Inpatient Hospital Stay (HOSPITAL_COMMUNITY): Payer: Medicare Other | Admitting: Vascular Surgery

## 2017-04-09 ENCOUNTER — Inpatient Hospital Stay (HOSPITAL_COMMUNITY)
Admission: RE | Admit: 2017-04-09 | Discharge: 2017-04-10 | DRG: 460 | Disposition: A | Discharge status: Home Health | Payer: Medicare Other | Source: Ambulatory Visit | Attending: Neurological Surgery | Admitting: Neurological Surgery

## 2017-04-09 ENCOUNTER — Inpatient Hospital Stay (HOSPITAL_COMMUNITY): Payer: Medicare Other

## 2017-04-09 ENCOUNTER — Encounter (HOSPITAL_COMMUNITY): Payer: Self-pay | Admitting: Neurological Surgery

## 2017-04-09 ENCOUNTER — Encounter (HOSPITAL_COMMUNITY)
Admission: RE | Disposition: A | Discharge status: Home Health | Payer: Self-pay | Source: Ambulatory Visit | Attending: Neurological Surgery

## 2017-04-09 ENCOUNTER — Inpatient Hospital Stay (HOSPITAL_COMMUNITY): Payer: Medicare Other | Admitting: Anesthesiology

## 2017-04-09 DIAGNOSIS — Z85828 Personal history of other malignant neoplasm of skin: Secondary | ICD-10-CM

## 2017-04-09 DIAGNOSIS — E785 Hyperlipidemia, unspecified: Secondary | ICD-10-CM | POA: Diagnosis present

## 2017-04-09 DIAGNOSIS — Z79899 Other long term (current) drug therapy: Secondary | ICD-10-CM

## 2017-04-09 DIAGNOSIS — I2510 Atherosclerotic heart disease of native coronary artery without angina pectoris: Secondary | ICD-10-CM | POA: Diagnosis present

## 2017-04-09 DIAGNOSIS — Z7984 Long term (current) use of oral hypoglycemic drugs: Secondary | ICD-10-CM

## 2017-04-09 DIAGNOSIS — H409 Unspecified glaucoma: Secondary | ICD-10-CM | POA: Diagnosis present

## 2017-04-09 DIAGNOSIS — G4730 Sleep apnea, unspecified: Secondary | ICD-10-CM | POA: Diagnosis present

## 2017-04-09 DIAGNOSIS — Z7982 Long term (current) use of aspirin: Secondary | ICD-10-CM

## 2017-04-09 DIAGNOSIS — E119 Type 2 diabetes mellitus without complications: Secondary | ICD-10-CM | POA: Diagnosis present

## 2017-04-09 DIAGNOSIS — Z888 Allergy status to other drugs, medicaments and biological substances status: Secondary | ICD-10-CM

## 2017-04-09 DIAGNOSIS — Z8249 Family history of ischemic heart disease and other diseases of the circulatory system: Secondary | ICD-10-CM | POA: Diagnosis not present

## 2017-04-09 DIAGNOSIS — M48061 Spinal stenosis, lumbar region without neurogenic claudication: Principal | ICD-10-CM | POA: Diagnosis present

## 2017-04-09 DIAGNOSIS — M549 Dorsalgia, unspecified: Secondary | ICD-10-CM | POA: Diagnosis not present

## 2017-04-09 DIAGNOSIS — M4317 Spondylolisthesis, lumbosacral region: Secondary | ICD-10-CM | POA: Diagnosis present

## 2017-04-09 DIAGNOSIS — Z91018 Allergy to other foods: Secondary | ICD-10-CM | POA: Diagnosis not present

## 2017-04-09 DIAGNOSIS — Z981 Arthrodesis status: Secondary | ICD-10-CM

## 2017-04-09 DIAGNOSIS — I10 Essential (primary) hypertension: Secondary | ICD-10-CM | POA: Diagnosis present

## 2017-04-09 DIAGNOSIS — Z419 Encounter for procedure for purposes other than remedying health state, unspecified: Secondary | ICD-10-CM

## 2017-04-09 DIAGNOSIS — M5126 Other intervertebral disc displacement, lumbar region: Secondary | ICD-10-CM | POA: Diagnosis not present

## 2017-04-09 HISTORY — PX: LAMINECTOMY WITH POSTERIOR LATERAL ARTHRODESIS LEVEL 1: SHX6335

## 2017-04-09 LAB — GLUCOSE, CAPILLARY
GLUCOSE-CAPILLARY: 168 mg/dL — AB (ref 65–99)
GLUCOSE-CAPILLARY: 172 mg/dL — AB (ref 65–99)
GLUCOSE-CAPILLARY: 159 mg/dL — AB (ref 65–99)
Glucose-Capillary: 134 mg/dL — ABNORMAL HIGH (ref 65–99)

## 2017-04-09 SURGERY — LAMINECTOMY WITH POSTERIOR LATERAL ARTHRODESIS LEVEL 1
Anesthesia: General | Site: Spine Lumbar

## 2017-04-09 MED ORDER — HYDROMORPHONE HCL 1 MG/ML IJ SOLN
INTRAMUSCULAR | Status: AC
  Administered 2017-04-09: 0.5 mg via INTRAVENOUS
  Filled 2017-04-09: qty 0.5

## 2017-04-09 MED ORDER — METHOCARBAMOL 500 MG PO TABS
ORAL_TABLET | ORAL | Status: AC
  Administered 2017-04-09: 500 mg via ORAL
  Filled 2017-04-09: qty 1

## 2017-04-09 MED ORDER — THROMBIN 20000 UNITS EX SOLR
CUTANEOUS | Status: DC | PRN
  Administered 2017-04-09: 20 mL via TOPICAL

## 2017-04-09 MED ORDER — MIDAZOLAM HCL 2 MG/2ML IJ SOLN
INTRAMUSCULAR | Status: AC
  Filled 2017-04-09: qty 2

## 2017-04-09 MED ORDER — THROMBIN 20000 UNITS EX SOLR
CUTANEOUS | Status: AC
  Filled 2017-04-09: qty 20000

## 2017-04-09 MED ORDER — DEXAMETHASONE SODIUM PHOSPHATE 10 MG/ML IJ SOLN
10.0000 mg | INTRAMUSCULAR | Status: AC
  Administered 2017-04-09: 10 mg via INTRAVENOUS

## 2017-04-09 MED ORDER — HYDROMORPHONE HCL 1 MG/ML IJ SOLN
0.2500 mg | INTRAMUSCULAR | Status: DC | PRN
  Administered 2017-04-09: 0.5 mg via INTRAVENOUS

## 2017-04-09 MED ORDER — DEXAMETHASONE SODIUM PHOSPHATE 10 MG/ML IJ SOLN
INTRAMUSCULAR | Status: AC
  Filled 2017-04-09: qty 1

## 2017-04-09 MED ORDER — BUPIVACAINE HCL (PF) 0.25 % IJ SOLN
INTRAMUSCULAR | Status: AC
  Filled 2017-04-09: qty 30

## 2017-04-09 MED ORDER — CHLORTHALIDONE 25 MG PO TABS
25.0000 mg | ORAL_TABLET | Freq: Every day | ORAL | Status: DC
  Filled 2017-04-09: qty 1

## 2017-04-09 MED ORDER — ONDANSETRON HCL 4 MG PO TABS: 4.0000 mg | ORAL_TABLET | Freq: Four times a day (QID) | ORAL | Status: DC | PRN

## 2017-04-09 MED ORDER — MORPHINE SULFATE (PF) 4 MG/ML IV SOLN
2.0000 mg | INTRAVENOUS | Status: DC | PRN
  Administered 2017-04-10: 2 mg via INTRAVENOUS
  Filled 2017-04-09: qty 1

## 2017-04-09 MED ORDER — PHENYLEPHRINE 40 MCG/ML (10ML) SYRINGE FOR IV PUSH (FOR BLOOD PRESSURE SUPPORT)
PREFILLED_SYRINGE | INTRAVENOUS | Status: AC
  Filled 2017-04-09: qty 10

## 2017-04-09 MED ORDER — BRINZOLAMIDE 1 % OP SUSP
1.0000 [drp] | Freq: Two times a day (BID) | OPHTHALMIC | Status: DC
  Administered 2017-04-09: 1 [drp] via OPHTHALMIC
  Filled 2017-04-09: qty 10

## 2017-04-09 MED ORDER — SODIUM CHLORIDE 0.9% FLUSH
3.0000 mL | Freq: Two times a day (BID) | INTRAVENOUS | Status: DC
  Administered 2017-04-09: 3 mL via INTRAVENOUS

## 2017-04-09 MED ORDER — ONDANSETRON HCL 4 MG/2ML IJ SOLN
INTRAMUSCULAR | Status: DC | PRN
  Administered 2017-04-09: 4 mg via INTRAVENOUS

## 2017-04-09 MED ORDER — MENTHOL 3 MG MT LOZG: 1.0000 | LOZENGE | OROMUCOSAL | Status: DC | PRN

## 2017-04-09 MED ORDER — BUPIVACAINE HCL (PF) 0.25 % IJ SOLN
INTRAMUSCULAR | Status: DC | PRN
  Administered 2017-04-09: 1 mL

## 2017-04-09 MED ORDER — TIMOLOL MALEATE 0.5 % OP SOLN
1.0000 [drp] | Freq: Two times a day (BID) | OPHTHALMIC | Status: DC
  Administered 2017-04-09: 1 [drp] via OPHTHALMIC
  Filled 2017-04-09: qty 5

## 2017-04-09 MED ORDER — LACTATED RINGERS IV SOLN
INTRAVENOUS | Status: DC | PRN
  Administered 2017-04-09 (×2): via INTRAVENOUS

## 2017-04-09 MED ORDER — SUCCINYLCHOLINE CHLORIDE 200 MG/10ML IV SOSY
PREFILLED_SYRINGE | INTRAVENOUS | Status: DC | PRN
  Administered 2017-04-09: 100 mg via INTRAVENOUS

## 2017-04-09 MED ORDER — LINAGLIPTIN 5 MG PO TABS
5.0000 mg | ORAL_TABLET | Freq: Every day | ORAL | Status: DC
  Filled 2017-04-09 (×2): qty 1

## 2017-04-09 MED ORDER — CHLORHEXIDINE GLUCONATE CLOTH 2 % EX PADS: 6.0000 | MEDICATED_PAD | Freq: Once | CUTANEOUS | Status: DC

## 2017-04-09 MED ORDER — PROMETHAZINE HCL 25 MG/ML IJ SOLN: 6.2500 mg | INTRAMUSCULAR | Status: DC | PRN

## 2017-04-09 MED ORDER — MEPERIDINE HCL 25 MG/ML IJ SOLN: 6.2500 mg | INTRAMUSCULAR | Status: DC | PRN

## 2017-04-09 MED ORDER — CEFAZOLIN SODIUM-DEXTROSE 2-4 GM/100ML-% IV SOLN
2.0000 g | Freq: Three times a day (TID) | INTRAVENOUS | Status: AC
  Administered 2017-04-09 (×2): 2 g via INTRAVENOUS
  Filled 2017-04-09 (×2): qty 100

## 2017-04-09 MED ORDER — METHOCARBAMOL 500 MG PO TABS
500.0000 mg | ORAL_TABLET | Freq: Four times a day (QID) | ORAL | Status: DC | PRN
  Administered 2017-04-09 – 2017-04-10 (×3): 500 mg via ORAL
  Filled 2017-04-09 (×3): qty 1

## 2017-04-09 MED ORDER — CEFAZOLIN SODIUM-DEXTROSE 2-4 GM/100ML-% IV SOLN
2.0000 g | INTRAVENOUS | Status: AC
  Administered 2017-04-09: 2 g via INTRAVENOUS

## 2017-04-09 MED ORDER — ASPIRIN EC 81 MG PO TBEC: 81.0000 mg | DELAYED_RELEASE_TABLET | Freq: Every evening | ORAL | Status: DC

## 2017-04-09 MED ORDER — METOPROLOL TARTRATE 25 MG PO TABS
25.0000 mg | ORAL_TABLET | Freq: Two times a day (BID) | ORAL | Status: DC
  Administered 2017-04-09: 25 mg via ORAL
  Filled 2017-04-09: qty 1

## 2017-04-09 MED ORDER — LIDOCAINE 2% (20 MG/ML) 5 ML SYRINGE
INTRAMUSCULAR | Status: AC
  Filled 2017-04-09: qty 5

## 2017-04-09 MED ORDER — OXYCODONE HCL 5 MG PO TABS
5.0000 mg | ORAL_TABLET | ORAL | Status: DC | PRN
  Administered 2017-04-09: 10 mg via ORAL

## 2017-04-09 MED ORDER — ACETAMINOPHEN 650 MG RE SUPP: 650.0000 mg | RECTAL | Status: DC | PRN

## 2017-04-09 MED ORDER — FENTANYL CITRATE (PF) 250 MCG/5ML IJ SOLN
INTRAMUSCULAR | Status: AC
  Filled 2017-04-09: qty 5

## 2017-04-09 MED ORDER — 0.9 % SODIUM CHLORIDE (POUR BTL) OPTIME
TOPICAL | Status: DC | PRN
  Administered 2017-04-09: 1000 mL

## 2017-04-09 MED ORDER — SUGAMMADEX SODIUM 200 MG/2ML IV SOLN
INTRAVENOUS | Status: DC | PRN
  Administered 2017-04-09: 200 mg via INTRAVENOUS

## 2017-04-09 MED ORDER — SUGAMMADEX SODIUM 200 MG/2ML IV SOLN
INTRAVENOUS | Status: AC
  Filled 2017-04-09: qty 2

## 2017-04-09 MED ORDER — ONDANSETRON HCL 4 MG/2ML IJ SOLN
INTRAMUSCULAR | Status: AC
  Filled 2017-04-09: qty 2

## 2017-04-09 MED ORDER — ROCURONIUM BROMIDE 10 MG/ML (PF) SYRINGE
PREFILLED_SYRINGE | INTRAVENOUS | Status: AC
  Filled 2017-04-09: qty 5

## 2017-04-09 MED ORDER — FENTANYL CITRATE (PF) 100 MCG/2ML IJ SOLN
INTRAMUSCULAR | Status: DC | PRN
  Administered 2017-04-09: 50 ug via INTRAVENOUS
  Administered 2017-04-09: 100 ug via INTRAVENOUS
  Administered 2017-04-09 (×2): 50 ug via INTRAVENOUS

## 2017-04-09 MED ORDER — PROPOFOL 10 MG/ML IV BOLUS
INTRAVENOUS | Status: AC
  Filled 2017-04-09: qty 40

## 2017-04-09 MED ORDER — THROMBIN 5000 UNITS EX SOLR
CUTANEOUS | Status: AC
  Filled 2017-04-09: qty 5000

## 2017-04-09 MED ORDER — PROPOFOL 10 MG/ML IV BOLUS
INTRAVENOUS | Status: DC | PRN
  Administered 2017-04-09: 200 mg via INTRAVENOUS

## 2017-04-09 MED ORDER — SODIUM CHLORIDE 0.9 % IR SOLN
Status: DC | PRN
  Administered 2017-04-09: 500 mL

## 2017-04-09 MED ORDER — CEFAZOLIN SODIUM-DEXTROSE 2-4 GM/100ML-% IV SOLN
INTRAVENOUS | Status: AC
  Filled 2017-04-09: qty 100

## 2017-04-09 MED ORDER — OXYCODONE-ACETAMINOPHEN 5-325 MG PO TABS
1.0000 | ORAL_TABLET | ORAL | Status: DC | PRN
  Administered 2017-04-09 – 2017-04-10 (×3): 2 via ORAL
  Administered 2017-04-10: 1 via ORAL
  Filled 2017-04-09: qty 1
  Filled 2017-04-09 (×3): qty 2

## 2017-04-09 MED ORDER — INSULIN ASPART 100 UNIT/ML ~~LOC~~ SOLN: 0.0000 [IU] | Freq: Three times a day (TID) | SUBCUTANEOUS | Status: DC

## 2017-04-09 MED ORDER — ROCURONIUM BROMIDE 100 MG/10ML IV SOLN
INTRAVENOUS | Status: DC | PRN
  Administered 2017-04-09: 50 mg via INTRAVENOUS
  Administered 2017-04-09: 30 mg via INTRAVENOUS
  Administered 2017-04-09: 10 mg via INTRAVENOUS

## 2017-04-09 MED ORDER — BRIMONIDINE TARTRATE-TIMOLOL 0.2-0.5 % OP SOLN
1.0000 [drp] | Freq: Two times a day (BID) | OPHTHALMIC | Status: DC
  Filled 2017-04-09: qty 5

## 2017-04-09 MED ORDER — POTASSIUM CHLORIDE IN NACL 20-0.9 MEQ/L-% IV SOLN
INTRAVENOUS | Status: DC
  Administered 2017-04-09: 20:00:00 via INTRAVENOUS

## 2017-04-09 MED ORDER — SENNA 8.6 MG PO TABS
1.0000 | ORAL_TABLET | Freq: Two times a day (BID) | ORAL | Status: DC
  Administered 2017-04-09: 8.6 mg via ORAL
  Filled 2017-04-09: qty 1

## 2017-04-09 MED ORDER — ARTIFICIAL TEARS OPHTHALMIC OINT
TOPICAL_OINTMENT | OPHTHALMIC | Status: AC
  Filled 2017-04-09: qty 3.5

## 2017-04-09 MED ORDER — LIDOCAINE 2% (20 MG/ML) 5 ML SYRINGE
INTRAMUSCULAR | Status: DC | PRN
Start: 2017-04-09 — End: 2017-04-09
  Administered 2017-04-09: 80 mg via INTRAVENOUS

## 2017-04-09 MED ORDER — EPHEDRINE 5 MG/ML INJ
INTRAVENOUS | Status: AC
  Filled 2017-04-09: qty 10

## 2017-04-09 MED ORDER — OXYCODONE HCL 5 MG PO TABS
ORAL_TABLET | ORAL | Status: AC
  Administered 2017-04-09: 10 mg via ORAL
  Filled 2017-04-09: qty 2

## 2017-04-09 MED ORDER — METHOCARBAMOL 1000 MG/10ML IJ SOLN
500.0000 mg | Freq: Four times a day (QID) | INTRAVENOUS | Status: DC | PRN
  Filled 2017-04-09: qty 5

## 2017-04-09 MED ORDER — LATANOPROST 0.005 % OP SOLN
1.0000 [drp] | Freq: Every day | OPHTHALMIC | Status: DC
  Administered 2017-04-09: 1 [drp] via OPHTHALMIC
  Filled 2017-04-09: qty 2.5

## 2017-04-09 MED ORDER — AMLODIPINE BESYLATE 10 MG PO TABS
10.0000 mg | ORAL_TABLET | Freq: Every evening | ORAL | Status: DC
  Filled 2017-04-09 (×2): qty 1

## 2017-04-09 MED ORDER — BRIMONIDINE TARTRATE 0.2 % OP SOLN
1.0000 [drp] | Freq: Two times a day (BID) | OPHTHALMIC | Status: DC
  Administered 2017-04-09: 1 [drp] via OPHTHALMIC
  Filled 2017-04-09: qty 5

## 2017-04-09 MED ORDER — THROMBIN 5000 UNITS EX SOLR
OROMUCOSAL | Status: DC | PRN
  Administered 2017-04-09: 5 mL via TOPICAL

## 2017-04-09 MED ORDER — CEFAZOLIN SODIUM-DEXTROSE 2-4 GM/100ML-% IV SOLN: 2.0000 g | INTRAVENOUS | Status: DC

## 2017-04-09 MED ORDER — PHENOL 1.4 % MT LIQD: 1.0000 | OROMUCOSAL | Status: DC | PRN

## 2017-04-09 MED ORDER — ONDANSETRON HCL 4 MG/2ML IJ SOLN: 4.0000 mg | Freq: Four times a day (QID) | INTRAMUSCULAR | Status: DC | PRN

## 2017-04-09 MED ORDER — SUCCINYLCHOLINE CHLORIDE 200 MG/10ML IV SOSY
PREFILLED_SYRINGE | INTRAVENOUS | Status: AC
  Filled 2017-04-09: qty 10

## 2017-04-09 MED ORDER — ACETAMINOPHEN 325 MG PO TABS: 650.0000 mg | ORAL_TABLET | ORAL | Status: DC | PRN

## 2017-04-09 MED ORDER — SODIUM CHLORIDE 0.9% FLUSH: 3.0000 mL | INTRAVENOUS | Status: DC | PRN

## 2017-04-09 MED ORDER — MIDAZOLAM HCL 2 MG/2ML IJ SOLN
INTRAMUSCULAR | Status: DC | PRN
  Administered 2017-04-09: 2 mg via INTRAVENOUS

## 2017-04-09 MED ORDER — CANAGLIFLOZIN 100 MG PO TABS
100.0000 mg | ORAL_TABLET | Freq: Every day | ORAL | Status: DC
  Filled 2017-04-09: qty 1

## 2017-04-09 SURGICAL SUPPLY — 57 items
BAG DECANTER FOR FLEXI CONT (MISCELLANEOUS) ×3 IMPLANT
BASKET BONE COLLECTION (BASKET) ×3 IMPLANT
BENZOIN TINCTURE PRP APPL 2/3 (GAUZE/BANDAGES/DRESSINGS) ×3 IMPLANT
BLADE CLIPPER SURG (BLADE) ×3 IMPLANT
BUR MATCHSTICK NEURO 3.0 LAGG (BURR) ×3 IMPLANT
CANISTER SUCT 3000ML PPV (MISCELLANEOUS) ×3 IMPLANT
CARTRIDGE OIL MAESTRO DRILL (MISCELLANEOUS) ×1 IMPLANT
CLOSURE WOUND 1/2 X4 (GAUZE/BANDAGES/DRESSINGS) ×2
CONT SPEC 4OZ CLIKSEAL STRL BL (MISCELLANEOUS) ×3 IMPLANT
COVER BACK TABLE 60X90IN (DRAPES) ×3 IMPLANT
DERMABOND ADVANCED (GAUZE/BANDAGES/DRESSINGS) ×2
DERMABOND ADVANCED .7 DNX12 (GAUZE/BANDAGES/DRESSINGS) ×1 IMPLANT
DIFFUSER DRILL AIR PNEUMATIC (MISCELLANEOUS) ×3 IMPLANT
DRAPE C-ARM 42X72 X-RAY (DRAPES) ×3 IMPLANT
DRAPE LAPAROTOMY 100X72X124 (DRAPES) ×3 IMPLANT
DRAPE POUCH INSTRU U-SHP 10X18 (DRAPES) ×3 IMPLANT
DRAPE SURG 17X23 STRL (DRAPES) ×3 IMPLANT
DRSG OPSITE POSTOP 4X6 (GAUZE/BANDAGES/DRESSINGS) ×3 IMPLANT
DURAPREP 26ML APPLICATOR (WOUND CARE) ×3 IMPLANT
ELECT REM PT RETURN 9FT ADLT (ELECTROSURGICAL) ×3
ELECTRODE REM PT RTRN 9FT ADLT (ELECTROSURGICAL) ×1 IMPLANT
EVACUATOR 1/8 PVC DRAIN (DRAIN) IMPLANT
GAUZE SPONGE 4X4 16PLY XRAY LF (GAUZE/BANDAGES/DRESSINGS) IMPLANT
GLOVE BIO SURGEON STRL SZ7 (GLOVE) IMPLANT
GLOVE BIO SURGEON STRL SZ8 (GLOVE) ×6 IMPLANT
GLOVE BIOGEL PI IND STRL 7.0 (GLOVE) ×2 IMPLANT
GLOVE BIOGEL PI IND STRL 7.5 (GLOVE) ×1 IMPLANT
GLOVE BIOGEL PI IND STRL 8 (GLOVE) ×1 IMPLANT
GLOVE BIOGEL PI INDICATOR 7.0 (GLOVE) ×4
GLOVE BIOGEL PI INDICATOR 7.5 (GLOVE) ×2
GLOVE BIOGEL PI INDICATOR 8 (GLOVE) ×2
GLOVE ECLIPSE 7.5 STRL STRAW (GLOVE) ×3 IMPLANT
GLOVE SURG SS PI 7.0 STRL IVOR (GLOVE) ×6 IMPLANT
GOWN STRL REUS W/ TWL LRG LVL3 (GOWN DISPOSABLE) ×1 IMPLANT
GOWN STRL REUS W/ TWL XL LVL3 (GOWN DISPOSABLE) ×2 IMPLANT
GOWN STRL REUS W/TWL 2XL LVL3 (GOWN DISPOSABLE) IMPLANT
GOWN STRL REUS W/TWL LRG LVL3 (GOWN DISPOSABLE) ×2
GOWN STRL REUS W/TWL XL LVL3 (GOWN DISPOSABLE) ×4
HEMOSTAT POWDER KIT SURGIFOAM (HEMOSTASIS) ×3 IMPLANT
KIT BASIN OR (CUSTOM PROCEDURE TRAY) ×3 IMPLANT
KIT ROOM TURNOVER OR (KITS) ×3 IMPLANT
NEEDLE HYPO 25X1 1.5 SAFETY (NEEDLE) ×3 IMPLANT
NS IRRIG 1000ML POUR BTL (IV SOLUTION) ×3 IMPLANT
OIL CARTRIDGE MAESTRO DRILL (MISCELLANEOUS) ×3
PACK LAMINECTOMY NEURO (CUSTOM PROCEDURE TRAY) ×3 IMPLANT
PAD ARMBOARD 7.5X6 YLW CONV (MISCELLANEOUS) ×15 IMPLANT
SPONGE LAP 4X18 X RAY DECT (DISPOSABLE) IMPLANT
SPONGE SURGIFOAM ABS GEL 100 (HEMOSTASIS) ×3 IMPLANT
STRIP CLOSURE SKIN 1/2X4 (GAUZE/BANDAGES/DRESSINGS) ×4 IMPLANT
SUT VIC AB 0 CT1 18XCR BRD8 (SUTURE) ×2 IMPLANT
SUT VIC AB 0 CT1 8-18 (SUTURE) ×4
SUT VIC AB 2-0 CP2 18 (SUTURE) ×6 IMPLANT
SUT VIC AB 3-0 SH 8-18 (SUTURE) ×6 IMPLANT
TOWEL GREEN STERILE (TOWEL DISPOSABLE) ×2 IMPLANT
TOWEL GREEN STERILE FF (TOWEL DISPOSABLE) ×2 IMPLANT
TRAY FOLEY W/METER SILVER 16FR (SET/KITS/TRAYS/PACK) ×3 IMPLANT
WATER STERILE IRR 1000ML POUR (IV SOLUTION) ×3 IMPLANT

## 2017-04-09 NOTE — Transfer of Care (Signed)
Immediate Anesthesia Transfer of Care Note  Patient: [name]  Procedure(s) Performed: Procedure(s): Lumbar TwoThree,Lumbar Three-Four,Lumbar Four-Five Laminectomy with Lumbar Five-Sacral One Posterior lateral fusion (N/A)  Patient Location: PACU  Anesthesia Type:General  Level of Consciousness: drowsy and patient cooperative  Airway & Oxygen Therapy: Patient Spontanous Breathing and Patient connected to nasal cannula oxygen  Post-op Assessment: Report given to RN, Post -op Vital signs reviewed and stable and Patient moving all extremities  Post vital signs: Reviewed and stable  Last Vitals:  Vitals:   04/09/17 0608  BP: (!) 97/51  Pulse: (!) 54  Resp: 18  Temp: 36.6 C    Last Pain:  Vitals:   04/09/17 0608  TempSrc: Oral         Complications: No apparent anesthesia complications

## 2017-04-09 NOTE — Evaluation (Signed)
Physical Therapy Evaluation Patient Details Name: [name] MRN: [id number] DOB: Jun 12, 1946 Today's Date: 04/09/2017   History of Present Illness  Patient is a 71 y/o male s/p L2-3, 3-4, 4-5 lami with L5-S1 fusion. PMH includes HTN, HLD, glaucoma, DM, CAD, cancer.  Clinical Impression  Patient presents with pain and post surgical deficits s/p above surgery. Tolerated transfers and gait training with Min A for balance/safety. Pt using walking stick for stability and wife to bring other one for tomorrow. Education re: back precautions, reviewed handout, modifications and exercise expectations. Pt has support from spouse at home. Plan for stair training tomorrow prior to d/c. Will follow acutely to maximize independence and mobility prior to return home.    Follow Up Recommendations No PT follow up;Supervision for mobility/OOB    Equipment Recommendations  None recommended by PT    Recommendations for Other Services       Precautions / Restrictions Precautions Precautions: Fall;Back Precaution Booklet Issued: Yes (comment) Precaution Comments: Reviewed handout and precautions Restrictions Weight Bearing Restrictions: No      Mobility  Bed Mobility Overal bed mobility: Needs Assistance Bed Mobility: Rolling;Sidelying to Sit;Sit to Sidelying Rolling: Supervision Sidelying to sit: Supervision     Sit to sidelying: Supervision General bed mobility comments: HOB flat, no use of rails to simulate home. Cues for log roll technique.   Transfers Overall transfer level: Needs assistance Equipment used:  (walking stick) Transfers: Sit to/from Stand Sit to Stand: Min guard         General transfer comment: Min guard for safety. Stood using walking stick.  Ambulation/Gait Ambulation/Gait assistance: Min assist Ambulation Distance (Feet): 150 Feet Assistive device:  (walking sticks) Gait Pattern/deviations: Step-through pattern;Decreased stride length Gait velocity:  decreased   General Gait Details: SLow, mildly unsteady gait using walking stick and HHA for support. Mild DOE 2/4. Sp02 92% on RA.  Stairs            Wheelchair Mobility    Modified Rankin (Stroke Patients Only)       Balance Overall balance assessment: Needs assistance Sitting-balance support: Feet supported;No upper extremity supported Sitting balance-Leahy Scale: Good     Standing balance support: During functional activity;Single extremity supported Standing balance-Leahy Scale: Poor Standing balance comment: Requires UE support using walking stick                             Pertinent Vitals/Pain Pain Assessment: 0-10 Pain Score: 6  Pain Location: back  Pain Descriptors / Indicators: Sore;Operative site guarding Pain Intervention(s): Monitored during session;Repositioned    Home Living Family/patient expects to be discharged to:: Private residence Living Arrangements: Spouse/significant other Available Help at Discharge: Family;Available 24 hours/day Type of Home: House Home Access: Stairs to enter Entrance Stairs-Rails: Right Entrance Stairs-Number of Steps: 3 Home Layout: Two level;Able to live on main level with bedroom/bathroom Home Equipment: Other (comment) Additional Comments: walking sticks    Prior Function Level of Independence: Independent with assistive device(s)         Comments: For the last few weeks, pt using walking sticks for support. Loves to sit at hobby desk and use computer.     Hand Dominance        Extremity/Trunk Assessment   Upper Extremity Assessment Upper Extremity Assessment: Defer to OT evaluation    Lower Extremity Assessment Lower Extremity Assessment: Generalized weakness    Cervical / Trunk Assessment Cervical / Trunk Assessment: Other exceptions Cervical / Trunk  Exceptions: s/p spine surgery  Communication   Communication: No difficulties  Cognition Arousal/Alertness: Awake/alert Behavior  During Therapy: WFL for tasks assessed/performed Overall Cognitive Status: Within Functional Limits for tasks assessed                                        General Comments      Exercises     Assessment/Plan    PT Assessment Patient needs continued PT services  PT Problem List Decreased mobility;Decreased knowledge of precautions;Decreased balance;Pain;Decreased activity tolerance;Decreased strength       PT Treatment Interventions Therapeutic activities;Gait training;Therapeutic exercise;Patient/family education;Balance training;Functional mobility training;Stair training    PT Goals (Current goals can be found in the Care Plan section)  Acute Rehab PT Goals Patient Stated Goal: to get better PT Goal Formulation: With patient Time For Goal Achievement: 04/23/17 Potential to Achieve Goals: Good    Frequency Min 5X/week   Barriers to discharge        Co-evaluation               AM-PAC PT "6 Clicks" Daily Activity  Outcome Measure Difficulty turning over in bed (including adjusting bedclothes, sheets and blankets)?: None Difficulty moving from lying on back to sitting on the side of the bed? : None Difficulty sitting down on and standing up from a chair with arms (e.g., wheelchair, bedside commode, etc,.)?: None Help needed moving to and from a bed to chair (including a wheelchair)?: A Little Help needed walking in hospital room?: A Little Help needed climbing 3-5 steps with a railing? : A Little 6 Click Score: 21    End of Session Equipment Utilized During Treatment: Gait belt Activity Tolerance: Patient tolerated treatment well Patient left: in bed;with call bell/phone within reach;with SCD's reapplied Nurse Communication: Mobility status PT Visit Diagnosis: Unsteadiness on feet (R26.81);Pain Pain - part of body:  (back)    Time: 3532-9924 PT Time Calculation (min) (ACUTE ONLY): 25 min   Charges:   PT Evaluation $PT Eval Low  Complexity: 1 Procedure PT Treatments $Gait Training: 8-22 mins   PT G Codes:        Wray Kearns, PT, DPT [phone]    Marguarite Arbour A Zunairah Devers 04/09/2017, 3:45 PM

## 2017-04-09 NOTE — Op Note (Signed)
04/09/2017  10:28 AM  PATIENT:  [name]  71 y.o. male  PRE-OPERATIVE DIAGNOSIS:  Lumbar spinal stenosis L2-3 L3-4 L4-5 with spondylolisthesis L5-S1  POST-OPERATIVE DIAGNOSIS:  same  PROCEDURE:  Decompressive lumbar laminectomy, medial facetectomy, and foraminotomy L2-3 L3-4 and L4-5 with posterior lumbar non-instrumented fusion L5-S1 utilizing locally harvested morcellized autologous bone graft  SURGEON:  Sherley Bounds, MD  ASSISTANTS: Dr. Sherwood Gambler  ANESTHESIA:   General  EBL: 200 ml  Total I/O In: 1600 [I.V.:1600] Out: 7616 [Urine:885; Blood:200]  BLOOD ADMINISTERED: none  DRAINS: None  SPECIMEN:  none  INDICATION FOR PROCEDURE: This patient presented with severe back pain. Imaging showed severe stenosis L2-3 L3-4 L4-5 with a grade 1 spondylolisthesis L5-S1 that appeared fairly stable in flexion-extension. The patient tried conservative measures without relief. Pain was debilitating. Recommended decompressive laminectomy L2-L4 with onlay fusion L5-S1. Patient understood the risks, benefits, and alternatives and potential outcomes and wished to proceed.  PROCEDURE DETAILS: The patient was taken to the operating room and after induction of adequate generalized endotracheal anesthesia, the patient was rolled into the prone position on the Wilson frame and all pressure points were padded. The lumbar region was cleaned and then prepped with DuraPrep and draped in the usual sterile fashion. 5 cc of local anesthesia was injected and then a dorsal midline incision was made and carried down to the lumbo sacral fascia. The fascia was opened and the paraspinous musculature was taken down in a subperiosteal fashion to expose L2-3 L3-4 L4-5 and L5-S1 bilaterally. Intraoperative fluoroscopy confirmed my level, and I removed the spinous processes of L2-L4 and then I used a combination of the high-speed drill and the Kerrison punches to perform a laminectomy, medial facetectomy, and  foraminotomy at L2-3 L3-4 L4-5 bilaterally. Bone graft was saved for later arthrodesis. The underlying yellow ligament was opened and removed in a piecemeal fashion to expose the underlying dura and exiting nerve roots. I undercut the lateral recesses and dissected down until I was medial to and distal to the pedicle at each level. The nerve root and central canal was well decompressed at each level.  I then palpated with a coronary dilator along the nerve root and into the foramen to assure adequate decompression. I felt no more compression of the nerve root. I irrigated with saline solution containing bacitracin. I then decorticated the lamina and facet process at L5-S1 and placed a mixture of morcellized autologous bone graft to perform arthrodesis L5-S1. Achieved hemostasis with bipolar cautery, lined the dura with Gelfoam, and then closed the fascia with 0 Vicryl. I closed the subcutaneous tissues with 2-0 Vicryl and the subcuticular tissues with 3-0 Vicryl. The skin was then closed with benzoin and Steri-Strips. The drapes were removed, a sterile dressing was applied. The patient was awakened from general anesthesia and transferred to the recovery room in stable condition. At the end of the procedure all sponge, needle and instrument counts were correct.    PLAN OF CARE: Admit for overnight observation  PATIENT DISPOSITION:  PACU - hemodynamically stable.   Delay start of Pharmacological VTE agent (>24hrs) due to surgical blood loss or risk of bleeding:  yes

## 2017-04-09 NOTE — Anesthesia Procedure Notes (Signed)
Procedure Name: Intubation Date/Time: 04/09/2017 7:39 AM Performed by: Trixie Deis A Pre-anesthesia Checklist: Patient identified, Emergency Drugs available, Suction available and Patient being monitored Patient Re-evaluated:Patient Re-evaluated prior to inductionOxygen Delivery Method: Circle System Utilized Preoxygenation: Pre-oxygenation with 100% oxygen Intubation Type: IV induction Ventilation: Mask ventilation without difficulty and Oral airway inserted - appropriate to patient size Laryngoscope Size: Glidescope and 4 Grade View: Grade I Tube type: Oral Tube size: 7.5 mm Number of attempts: 1 Airway Equipment and Method: Rigid stylet and Video-laryngoscopy Placement Confirmation: ETT inserted through vocal cords under direct vision,  positive ETCO2 and breath sounds checked- equal and bilateral Secured at: 23 cm Tube secured with: Tape Dental Injury: Teeth and Oropharynx as per pre-operative assessment

## 2017-04-09 NOTE — H&P (Signed)
Subjective: Patient is a 71 y.o. male admitted for back pain. Onset of symptoms was several months ago, gradually worsening since that time.  The pain is rated severe, and is located at the across the lower back and radiates to buttocks. The pain is described as aching and occurs all day. The symptoms have been progressive. Symptoms are exacerbated by exercise. MRI or CT showed stenosis L2-4 with spondylolisthesis L5--S1   Past Medical History:  Diagnosis Date  . Arthritis    DDD, scoliosis, stenosis    . 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 Calcium blood increased   . Cancer (HCC)    skin ca, posterior R ear   ([place])  . Complication of anesthesia    difficulty intubation , anterior larynx, easy mask ventilation  . Coronary artery disease   . Diabetes mellitus without complication ([place])   . Difficult intubation   . Diverticulitis   . Glaucoma   . Headache(784.0)    migraines  . History of kidney stones   . Hyperlipemia   . Hypertension   . Sleep apnea    CPAP still in use q night, & for naps thru out the day, pt. reports that last study was neg for sleep apnea but he still uses the device    . Spinal stenosis   . Ventricular dilatation    Right    Past Surgical History:  Procedure Laterality Date  . APPENDECTOMY  1991  . BOWEL RESECTION  1985  . CARDIAC CATHETERIZATION  2006  . CATARACT EXTRACTION Right   . CHOLECYSTECTOMY  1990  . COLON SURGERY     bowel resection, for diverticulitis   . COLONOSCOPY N/A 06/15/2013   Procedure: COLONOSCOPY;  Surgeon: Beryle Beams, MD;  Location: [facility];  Service: Endoscopy;  Laterality: N/A;  . CORONARY ANGIOPLASTY     STENT PLACED  . LITHOTRIPSY    . SPINAL CORD STIMULATOR INSERTION N/A 03/20/2016   Procedure: LUMBAR SPINAL CORD STIMULATOR INSERTION;  Surgeon: Clydell Hakim, MD;  Location: [facility];  Service: Neurosurgery;  Laterality: N/A;  . TONSILLECTOMY    . [place] ?    Prior to Admission medications   Medication Sig Start Date End Date Taking? Authorizing Provider  acetaminophen (TYLENOL) 650 MG CR tablet Take 650 mg by mouth 2 (two) times daily.   Yes [provider]  amLODipine (NORVASC) 10 MG tablet TAKE 1 TABLET BY MOUTH DAILY Patient taking differently: TAKE 1 TABLET BY MOUTH DAILY IN THE EVENING 01/29/17  Yes Tenna Delaine D, PA-C  aspirin 81 MG tablet Take 81 mg by mouth every evening.    Yes [provider]  bimatoprost (LUMIGAN) 0.01 % SOLN Place 1 drop into both eyes every evening.   Yes [provider]  brimonidine-timolol (COMBIGAN) 0.2-0.5 % ophthalmic solution Place 1 drop into both eyes 2 (two) times daily.   Yes [provider]  brinzolamide (AZOPT) 1 % ophthalmic suspension Place 1 drop into both eyes 2 (two) times daily.    Yes [provider]  chlorthalidone (HYGROTON) 25 MG tablet TAKE 1 TABLET(25 MG) BY MOUTH DAILY 01/20/17  Yes Harrison Mons, PA-C  Cholecalciferol (VITAMIN D3) 5000 units CAPS Take 5,000 Units by mouth every Monday, Wednesday, and Friday.   Yes [provider]  Coenzyme Q10 (COQ-10) 200 MG CAPS Take 200 mg by mouth daily.   Yes [provider]  empagliflozin (JARDIANCE)  25 MG TABS tablet Take 25 mg by mouth daily. Patient taking differently: Take 25 mg by mouth every evening.  02/02/17  Yes Wardell Honour, MD  metoprolol tartrate (LOPRESSOR) 25 MG tablet Take 25 mg by mouth 2 (two) times daily.   Yes [provider]  Multiple Vitamins-Minerals (PRESERVISION AREDS 2) CAPS Take 1 capsule by mouth 2 (two) times daily.   Yes [provider]  mupirocin ointment (BACTROBAN) 2 % Apply 1 application topically 2 (two) times daily. Patient taking differently: Apply 1 application topically daily as needed (skin infections).  03/18/17  Yes Weber, Sarah L, PA-C  Omega-3 Fatty Acids (FISH OIL) 1200 MG CAPS Take 1,200 mg by mouth 2 (two)  times daily.   Yes [provider]  OVER THE COUNTER MEDICATION Take 1,000 mg by mouth daily. microlactin otc supplement   Yes [provider]  rosuvastatin (CRESTOR) 10 MG tablet Take 1 tablet (10 mg total) by mouth daily. 02/02/17  Yes Wardell Honour, MD  sitaGLIPtin (JANUVIA) 100 MG tablet Take 1 tablet (100 mg total) by mouth daily. 02/02/17  Yes Wardell Honour, MD  sodium chloride (OCEAN) 0.65 % SOLN nasal spray Place 1 spray into both nostrils as needed (dryness).   Yes [provider]  SUMAtriptan (IMITREX) 50 MG tablet TAKE 1 TABLET BY MOUTH EVERY 2 HOURS AS NEEDED FOR MIGRAINE 03/05/17  Yes Wardell Honour, MD  SUPER B COMPLEX/C PO Take 1 tablet by mouth daily.   Yes [provider]  tolnaftate (TINACTIN) 1 % cream Apply 1 application topically daily as needed (athlete's foot).   Yes [provider]  doxycycline (VIBRA-TABS) 100 MG tablet Take 1 tablet (100 mg total) by mouth 2 (two) times daily. Patient not taking: Reported on 03/30/2017 03/18/17   Mancel Bale, PA-C  Famotidine-Ca Carb-Mag Hydrox (PEPCID COMPLETE PO) Take 1 tablet by mouth daily as needed (acid reflux).    [provider]  glucose blood (FREESTYLE LITE) test strip Test blood sugar once daily. Dx: E11.9 05/21/16   Wardell Honour, MD  Lancets (FREESTYLE) lancets Test blood sugar once daily. Dx: E11.9 05/21/16   Wardell Honour, MD   Allergies  Allergen Reactions  . Amaryl Other (See Comments)    Gives pt migraines  . Other Other (See Comments)    Pistachios, lima beans, and some types of chocolate cause migraines  Nasal corticosteroids causes nose bleeds  . Skelaxin [Metaxalone] Rash    Cherry red rash with sloughing off of skin at left upper chest/shoulder  . Hydrocodone     Ineffective   . Pistachio Nut (Diagnostic) Other (See Comments)     In  Addition :Lima beans, milk chocolate   . Levofloxacin Nausea Only  . Lisinopril Other (See Comments)    cough  .  Metformin And Related Diarrhea    Social History  Substance Use Topics  . Smoking status: Never Smoker  . Smokeless tobacco: Never Used  . Alcohol use 0.0 oz/week     Comment: seldom     Family History  Problem Relation Age of Onset  . Heart disease Father   . Heart attack Father   . Heart disease Paternal Grandmother      Review of Systems  Positive ROS: neg  All other systems have been reviewed and were otherwise negative with the exception of those mentioned in the HPI and as above.  Objective: Vital signs in last 24 hours: Temp:  [97.8 F (36.6  C)] 97.8 F (36.6 C) (05/25 0608) Pulse Rate:  [54] 54 (05/25 0608) Resp:  [18] 18 (05/25 9373) BP: (97)/(51) 97/51 (05/25 0608) SpO2:  [95 %] 95 % (05/25 0608) Weight:  [94.3 kg (208 lb)] 94.3 kg (208 lb) (05/25 4287)  General Appearance: Alert, cooperative, no distress, appears stated age Head: Normocephalic, without obvious abnormality, atraumatic Eyes: PERRL, conjunctiva/corneas clear, EOM's intact    Neck: Supple, symmetrical, trachea midline Back: Symmetric, no curvature, ROM normal, no CVA tenderness Lungs:  respirations unlabored Heart: Regular rate and rhythm Abdomen: Soft, non-tender Extremities: Extremities normal, atraumatic, no cyanosis or edema Pulses: 2+ and symmetric all extremities Skin: Skin color, texture, turgor normal, no rashes or lesions  NEUROLOGIC:   Mental status: Alert and oriented x4,  no aphasia, good attention span, fund of knowledge, and memory Motor Exam - grossly normal Sensory Exam - grossly normal Reflexes: 1+ Coordination - grossly normal Gait - grossly normal Balance - grossly normal Cranial Nerves: I: smell Not tested  II: visual acuity  OS: nl    OD: nl  II: visual fields Full to confrontation  II: pupils Equal, round, reactive to light  III,VII: ptosis None  III,IV,VI: extraocular muscles  Full ROM  V: mastication Normal  V: facial light touch sensation  Normal  V,VII:  corneal reflex  Present  VII: facial muscle function - upper  Normal  VII: facial muscle function - lower Normal  VIII: hearing Not tested  IX: soft palate elevation  Normal  IX,X: gag reflex Present  XI: trapezius strength  5/5  XI: sternocleidomastoid strength 5/5  XI: neck flexion strength  5/5  XII: tongue strength  Normal    Data Review Lab Results  Component Value Date   WBC 6.4 04/01/2017   HGB 16.1 04/01/2017   HCT 44.7 04/01/2017   MCV 91.0 04/01/2017   PLT 153 04/01/2017   Lab Results  Component Value Date   NA 141 04/01/2017   K 3.2 (L) 04/01/2017   CL 102 04/01/2017   CO2 28 04/01/2017   BUN 18 04/01/2017   CREATININE 0.89 04/01/2017   GLUCOSE 135 (H) 04/01/2017   Lab Results  Component Value Date   INR 1.02 04/01/2017    Assessment/Plan: Patient admitted for lumbar laminectomy L-4 with fusion L5--S1. Patient has failed a reasonable attempt at conservative therapy.  I explained the condition and procedure to the patient and answered any questions.  Patient wishes to proceed with procedure as planned. Understands risks/ benefits and typical outcomes of procedure.   Dhruv Christina S 04/09/2017 6:36 AM

## 2017-04-09 NOTE — Anesthesia Postprocedure Evaluation (Signed)
Anesthesia Post Note  Patient: [name]  Procedure(s) Performed: Procedure(s) (LRB): Lumbar TwoThree,Lumbar Three-Four,Lumbar Four-Five Laminectomy with Lumbar Five-Sacral One Posterior lateral fusion (N/A)  Patient location during evaluation: PACU Anesthesia Type: General Level of consciousness: awake and alert and oriented Pain management: pain level controlled Vital Signs Assessment: post-procedure vital signs reviewed and stable Respiratory status: spontaneous breathing, nonlabored ventilation, respiratory function stable and patient connected to nasal cannula oxygen Cardiovascular status: blood pressure returned to baseline and stable Postop Assessment: no signs of nausea or vomiting Anesthetic complications: no       Last Vitals:  Vitals:   04/09/17 1120 04/09/17 1133  BP: (!) 95/45   Pulse: (!) 57 (!) 56  Resp: 13 13  Temp:  36.7 C    Last Pain:  Vitals:   04/09/17 1133  TempSrc:   PainSc: Asleep    LLE Motor Response: Purposeful movement;Responds to commands (04/09/17 1133) LLE Sensation: Full sensation (04/09/17 1133) RLE Motor Response: Purposeful movement;Responds to commands (04/09/17 1133) RLE Sensation: Full sensation (04/09/17 1133)      [name] A.

## 2017-04-10 ENCOUNTER — Encounter (HOSPITAL_COMMUNITY): Payer: Self-pay | Admitting: Neurological Surgery

## 2017-04-10 DIAGNOSIS — M549 Dorsalgia, unspecified: Secondary | ICD-10-CM | POA: Diagnosis not present

## 2017-04-10 DIAGNOSIS — M4317 Spondylolisthesis, lumbosacral region: Secondary | ICD-10-CM | POA: Diagnosis not present

## 2017-04-10 DIAGNOSIS — E785 Hyperlipidemia, unspecified: Secondary | ICD-10-CM | POA: Diagnosis not present

## 2017-04-10 DIAGNOSIS — H409 Unspecified glaucoma: Secondary | ICD-10-CM | POA: Diagnosis not present

## 2017-04-10 DIAGNOSIS — E119 Type 2 diabetes mellitus without complications: Secondary | ICD-10-CM | POA: Diagnosis not present

## 2017-04-10 DIAGNOSIS — M48061 Spinal stenosis, lumbar region without neurogenic claudication: Secondary | ICD-10-CM | POA: Diagnosis not present

## 2017-04-10 DIAGNOSIS — G4730 Sleep apnea, unspecified: Secondary | ICD-10-CM | POA: Diagnosis not present

## 2017-04-10 LAB — GLUCOSE, CAPILLARY: GLUCOSE-CAPILLARY: 140 mg/dL — AB (ref 65–99)

## 2017-04-10 MED ORDER — OXYCODONE-ACETAMINOPHEN 5-325 MG PO TABS: 1.0000 | ORAL_TABLET | ORAL | 0 refills | Status: DC | PRN

## 2017-04-10 MED ORDER — METHOCARBAMOL 500 MG PO TABS: 500.0000 mg | ORAL_TABLET | Freq: Four times a day (QID) | ORAL | 0 refills | Status: DC | PRN

## 2017-04-10 NOTE — Discharge Instructions (Signed)
Wound Care Keep incision covered and dry for 3 days.  You may remove outer bandage after 3 days and shower.  Do not put any creams, lotions, or ointments on incision. Leave steri-strips on back.  They will fall off by themselves. Activity Walk each and every day, increasing distance each day. No lifting greater than 5 lbs.  Avoid bending, arching, or twisting. No driving for 2 weeks; may ride as a passenger locally. If provided with back brace, wear when out of bed.  It is not necessary to wear in bed. Diet Resume your normal diet.  Return to Work Will be discussed at you follow up appointment. Call Your Doctor If Any of These Occur Redness, drainage, or swelling at the wound.  Temperature greater than 101 degrees. Severe pain not relieved by pain medication. Incision starts to come apart. Follow Up Appt Call today for appointment in 2 weeks ([phone]) or for problems.  If you have any hardware placed in your spine, you will need an x-ray before your appointment.

## 2017-04-10 NOTE — Progress Notes (Signed)
Physical Therapy Treatment Note on 04/10/17  Clinical Impression: Pt demonstrates improvement in mobility and gait this session with RW use. Pt continues to require supervision for bed mobs and transfers and wife will be present throughout session. Pt is making steady progress toward goals and will benefit from HHPT at discharge.    04/10/17 1159  PT Visit Information  Last PT Received On 04/10/17  Assistance Needed +1  History of Present Illness Patient is a 71 y/o male s/p L2-3, 3-4, 4-5 lami with L5-S1 fusion. PMH includes HTN, HLD, glaucoma, DM, CAD, cancer.  Subjective Data  Subjective pt is in bathroom with OT, ready to move.   Patient Stated Goal to get better  Precautions  Precautions Fall;Back  Precaution Booklet Issued Yes (comment)  Precaution Comments Reviewed handout provided by PT and back precautions related to ADL. Pt able to verbalize 3/3 precautions.   Restrictions  Weight Bearing Restrictions No  Pain Assessment  Pain Assessment 0-10  Pain Score 5  Pain Location back and R LE  Pain Descriptors / Indicators Sore;Operative site guarding  Pain Intervention(s) Monitored during session;Premedicated before session;Relaxation;Repositioned  Cognition  Arousal/Alertness Awake/alert  Behavior During Therapy WFL for tasks assessed/performed  Overall Cognitive Status Within Functional Limits for tasks assessed  Bed Mobility  Overal bed mobility Needs Assistance  Bed Mobility Rolling;Sit to Sidelying  Rolling Supervision  Sit to sidelying Supervision  General bed mobility comments supervision to get back into bed. no hands on assist required  Transfers  Overall transfer level Needs assistance  Equipment used Rolling walker (2 wheeled)  Transfers Sit to/from Stand  Sit to Stand Min assist;From elevated surface  General transfer comment Min a from commode  Ambulation/Gait  Ambulation/Gait assistance Min guard  Ambulation Distance (Feet) 75 Feet  Assistive device Rolling  walker (2 wheeled)  Gait Pattern/deviations Step-through pattern;Antalgic  General Gait Details moderate antalgic gait, improved sequencing with RW.   Gait velocity decreased  Gait velocity interpretation Below normal speed for age/gender  Balance  Overall balance assessment Needs assistance  Sitting-balance support Feet supported;No upper extremity supported  Sitting balance-Leahy Scale Good  Standing balance support Bilateral upper extremity supported;During functional activity  Standing balance-Leahy Scale Poor  Standing balance comment reliant on UE's for support in standing  PT - End of Session  Equipment Utilized During Treatment Gait belt  Activity Tolerance Patient tolerated treatment well  Patient left in bed;with call bell/phone within reach;with family/visitor present  Nurse Communication Mobility status  PT - Assessment/Plan  PT Plan Current plan remains appropriate  PT Visit Diagnosis Unsteadiness on feet (R26.81);Pain  Pain - part of body (back)  PT Frequency (ACUTE ONLY) Min 5X/week  Follow Up Recommendations Home health PT  PT equipment Rolling walker with 5" wheels;3in1 (PT)  AM-PAC PT "6 Clicks" Daily Activity Outcome Measure  Difficulty turning over in bed (including adjusting bedclothes, sheets and blankets)? 4  Difficulty moving from lying on back to sitting on the side of the bed?  4  Difficulty sitting down on and standing up from a chair with arms (e.g., wheelchair, bedside commode, etc,.)? 3  Help needed moving to and from a bed to chair (including a wheelchair)? 3  Help needed walking in hospital room? 3  Help needed climbing 3-5 steps with a railing?  2  6 Click Score 19  Mobility G Code  CJ  PT Goal Progression  Progress towards PT goals Progressing toward goals  PT Time Calculation  PT Start Time (ACUTE ONLY) 0930  PT Stop Time (ACUTE ONLY) 0955  PT Time Calculation (min) (ACUTE ONLY) 25 min  PT General Charges  $$ ACUTE PT VISIT 1 Procedure  PT  Treatments  $Gait Training 8-22 mins  $Therapeutic Activity 8-22 mins   Scheryl Marten PT, DPT  [phone]

## 2017-04-10 NOTE — Discharge Summary (Signed)
Physician Discharge Summary  Patient ID: [name] MRN: [id number] DOB/AGE: 71/26/1947 71 y.o.  Admit date: 04/09/2017 Discharge date: 04/10/2017  Admission Diagnoses:  Lumbar spinal stenosis L2-3 L3-4 L4-5 with spondylolisthesis L5-S1  Discharge Diagnoses:  Lumbar spinal stenosis L2-3 L3-4 L4-5 with spondylolisthesis L5-S1 Active Problems:   S/P lumbar spinal fusion   Discharged Condition: good  Hospital Course: Patient admitted by Dr. Sherley Bounds who performed a decompressive lumbar laminectomy and arthrodesis.  Postoperatively he is up and ambulating, although he is more comfortable this morning. He is asking to be discharged to home with home health PT and OT. We have given him instructions regarding wound care and activities. He is scheduled to follow-up with Dr. Ronnald Ramp.  Discharge Exam: Blood pressure (!) 103/45, pulse 81, temperature 98.9 F (37.2 C), resp. rate 18, weight 94.3 kg (208 lb), SpO2 96 %.  Disposition: 01-Home or Self Care  Discharge Instructions    Diet - low sodium heart healthy    Complete by:  As directed    Increase activity slowly    Complete by:  As directed      Allergies as of 04/10/2017      Reactions   Amaryl Other (See Comments)   Gives pt migraines   Other Other (See Comments)   Pistachios, lima beans, and some types of chocolate cause migraines Nasal corticosteroids causes nose bleeds   Skelaxin [metaxalone] Rash   Cherry red rash with sloughing off of skin at left upper chest/shoulder   Hydrocodone    Ineffective    Pistachio Nut (diagnostic) Other (See Comments)    In  Addition :Lima beans, milk chocolate    Levofloxacin Nausea Only   Lisinopril Other (See Comments)   cough   Metformin And Related Diarrhea      Medication List    TAKE these medications   acetaminophen 650 MG CR tablet Commonly known as:  TYLENOL Take 650 mg by mouth 2 (two) times daily.   amLODipine 10 MG tablet Commonly known as:  NORVASC TAKE 1  TABLET BY MOUTH DAILY What changed:  See the new instructions.   aspirin 81 MG tablet Take 81 mg by mouth every evening.   bimatoprost 0.01 % Soln Commonly known as:  LUMIGAN Place 1 drop into both eyes every evening.   brinzolamide 1 % ophthalmic suspension Commonly known as:  AZOPT Place 1 drop into both eyes 2 (two) times daily.   chlorthalidone 25 MG tablet Commonly known as:  HYGROTON TAKE 1 TABLET(25 MG) BY MOUTH DAILY   COMBIGAN 0.2-0.5 % ophthalmic solution Generic drug:  brimonidine-timolol Place 1 drop into both eyes 2 (two) times daily.   CoQ-10 200 MG Caps Take 200 mg by mouth daily.   doxycycline 100 MG tablet Commonly known as:  VIBRA-TABS Take 1 tablet (100 mg total) by mouth 2 (two) times daily.   empagliflozin 25 MG Tabs tablet Commonly known as:  JARDIANCE Take 25 mg by mouth daily. What changed:  when to take this   Fish Oil 1200 MG Caps Take 1,200 mg by mouth 2 (two) times daily.   freestyle lancets Test blood sugar once daily. Dx: E11.9   glucose blood test strip Commonly known as:  FREESTYLE LITE Test blood sugar once daily. Dx: E11.9   methocarbamol 500 MG tablet Commonly known as:  ROBAXIN Take 1 tablet (500 mg total) by mouth every 6 (six) hours as needed for muscle spasms.   metoprolol tartrate 25 MG tablet Commonly known as:  LOPRESSOR Take 25 mg by mouth 2 (two) times daily.   mupirocin ointment 2 % Commonly known as:  BACTROBAN Apply 1 application topically 2 (two) times daily. What changed:  when to take this  reasons to take this   OVER THE COUNTER MEDICATION Take 1,000 mg by mouth daily. microlactin otc supplement   oxyCODONE-acetaminophen 5-325 MG tablet Commonly known as:  PERCOCET/ROXICET Take 1-2 tablets by mouth every 4 (four) hours as needed for moderate pain.   PEPCID COMPLETE PO Take 1 tablet by mouth daily as needed (acid reflux).   PRESERVISION AREDS 2 Caps Take 1 capsule by mouth 2 (two) times daily.    rosuvastatin 10 MG tablet Commonly known as:  CRESTOR Take 1 tablet (10 mg total) by mouth daily.   sitaGLIPtin 100 MG tablet Commonly known as:  JANUVIA Take 1 tablet (100 mg total) by mouth daily.   sodium chloride 0.65 % Soln nasal spray Commonly known as:  OCEAN Place 1 spray into both nostrils as needed (dryness).   SUMAtriptan 50 MG tablet Commonly known as:  IMITREX TAKE 1 TABLET BY MOUTH EVERY 2 HOURS AS NEEDED FOR MIGRAINE   SUPER B COMPLEX/C PO Take 1 tablet by mouth daily.   tolnaftate 1 % cream Commonly known as:  TINACTIN Apply 1 application topically daily as needed (athlete's foot).   Vitamin D3 5000 units Caps Take 5,000 Units by mouth every Monday, Wednesday, and Friday.            Durable Medical Equipment        Start     Ordered   04/09/17 1220  DME Walker rolling  Once    Question:  Patient needs a walker to treat with the following condition  Answer:  S/P lumbar fusion   04/09/17 1220   04/09/17 1220  DME 3 n 1  Once     04/09/17 1220       Signed: Jovita Gamma W 04/10/2017, 9:06 AM

## 2017-04-10 NOTE — Care Management Note (Signed)
Case Management Note  Patient Details  Name: KYM FENTER MRN: [id number] Date of Birth: December 20, 1945  Subjective/Objective:                  Lumbar spinal stenosis Action/Plan: Discharge planning Expected Discharge Date:  04/10/17               Expected Discharge Plan:  [place]  In-House Referral:     Discharge planning Services  CM Consult  Post Acute Care Choice:  Home Health Choice offered to:  Patient  DME Arranged:  3-N-1 DME Agency:  [place] Arranged:  PT, OT [facility] Agency:  [place]  Status of Service:  Completed, signed off  If discussed at [place] of Stay Meetings, dates discussed:    Additional Comments: CM spoke with spouse of pt who requests we call her to schedule [phone] [facility] services. Choice of agency was offered and family chooses [facility] to render HHPT/OT.  Referral called to [facility] rep, Jermaine.  CM notified [place] DME rep, Reggie to please deliver the 3n1 to room so pt can discharge. No other CM needs were communicated. Dellie Catholic, RN 04/10/2017, 9:15 AM

## 2017-04-10 NOTE — Evaluation (Signed)
Occupational Therapy Evaluation Patient Details Name: [name] MRN: [id number] DOB: 1946/05/27 Today's Date: 04/10/2017    History of Present Illness Patient is a 71 y/o male s/p L2-3, 3-4, 4-5 lami with L5-S1 fusion. PMH includes HTN, HLD, glaucoma, DM, CAD, cancer.   Clinical Impression   PTA, pt was independent with walking stick for ADL and functional mobility. Pt currently requires mod assist for LB dressing tasks and min assist for functional mobility. He presents with generalized weakness, R LE pain, post-surgical back pain, and decreased activity tolerance for ADL impacting ability to participate in ADL at [facility]. He additionally demonstrates poor dynamic standing balance increasing his risk of falling and decreasing his safety during ADL participation. Pt and wife educated on back precautions during ADL as well as compensatory strategies for dressing, bathing, toileting, and grooming tasks and they verbalize understanding. Feel pt would benefit from home health OT services post-acute D/C in order to maximize independence and safety with ADL post-acute D/C. OT will continue to follow while admitted.     Follow Up Recommendations  Home health OT;Supervision/Assistance - 24 hour    Equipment Recommendations  3 in 1 bedside commode;Other (comment) (toilet aide/tongs)    Recommendations for Other Services       Precautions / Restrictions Precautions Precautions: Fall;Back Precaution Booklet Issued: Yes (comment) Precaution Comments: Reviewed handout provided by PT and back precautions related to ADL. Pt able to verbalize 3/3 precautions.  Restrictions Weight Bearing Restrictions: No      Mobility Bed Mobility Overal bed mobility: Needs Assistance Bed Mobility: Rolling;Sidelying to Sit Rolling: Supervision Sidelying to sit: Min guard       General bed mobility comments: Much increased time and effort to raise trunk from bed and VC's for log roll technique.    Transfers Overall transfer level: Needs assistance Equipment used: Rolling walker (2 wheeled) Transfers: Sit to/from Stand Sit to Stand: Min assist;From elevated surface         General transfer comment: Min assist for lifting from elevated bed and from [facility] over toilet.     Balance Overall balance assessment: Needs assistance Sitting-balance support: Feet supported;No upper extremity supported Sitting balance-Leahy Scale: Good     Standing balance support: Single extremity supported;Bilateral upper extremity supported;During functional activity Standing balance-Leahy Scale: Poor Standing balance comment: Requires UE support or external assist for safety with standing ADL.                            ADL either performed or assessed with clinical judgement   ADL Overall ADL's : Needs assistance/impaired Eating/Feeding: Set up;Sitting   Grooming: Minimal assistance;Standing;Oral care   Upper Body Bathing: Min guard;Sitting   Lower Body Bathing: Moderate assistance;Sit to/from stand   Upper Body Dressing : Min guard;Sitting   Lower Body Dressing: Moderate assistance;Sit to/from stand   Toilet Transfer: Minimal assistance;Ambulation;RW Toilet Transfer Details (indicate cue type and reason): At times fluctuating between min guard and min assist. Instability and buckling of R LE impacting safety and requiring min assist. Toileting- Clothing Manipulation and Hygiene: Minimal assistance;Sit to/from stand       Functional mobility during ADLs: Minimal assistance;Rolling walker General ADL Comments: Pt with decreased stability requiring min assist during functional mobility with RW. Educated pt on compensatory strategies for dressing, bathing, toileting, and grooming tasks to adhere to back precautions. Pt reports and demonstrates understanding of these. Unable to attempt shower transfer due to decreased safety with mobility and  recommend pt progress with this task  only with home health OT post-acute D/C.     Vision Patient Visual Report: No change from baseline Vision Assessment?: No apparent visual deficits     Perception     Praxis      Pertinent Vitals/Pain Pain Assessment: Faces Faces Pain Scale: Hurts even more Pain Location: back and R LE Pain Descriptors / Indicators: Sore;Operative site guarding Pain Intervention(s): Monitored during session;Repositioned;Premedicated before session     Hand Dominance     Extremity/Trunk Assessment Upper Extremity Assessment Upper Extremity Assessment: Generalized weakness   Lower Extremity Assessment Lower Extremity Assessment: Generalized weakness   Cervical / Trunk Assessment Cervical / Trunk Assessment: Other exceptions Cervical / Trunk Exceptions: s/p spine surgery   Communication Communication Communication: No difficulties   Cognition Arousal/Alertness: Awake/alert Behavior During Therapy: WFL for tasks assessed/performed Overall Cognitive Status: Within Functional Limits for tasks assessed                                     General Comments       Exercises     Shoulder Instructions      Home Living Family/patient expects to be discharged to:: Private residence Living Arrangements: Spouse/significant other Available Help at Discharge: Family;Available 24 hours/day Type of Home: House Home Access: Stairs to enter [employer] of Steps: 3 Entrance Stairs-Rails: Right Home Layout: Two level;Able to live on main level with bedroom/bathroom Alternate Level Stairs-Number of Steps: 1 flight Alternate Level Stairs-Rails: Right Bathroom Shower/Tub: Occupational psychologist: Standard     Home Equipment: Shower seat;Hand held shower head;Grab bars - tub/shower;Walker - 2 wheels (walking sticks)          Prior Functioning/Environment Level of Independence: Independent with assistive device(s)        Comments: For the last few weeks,  pt using walking sticks for support. Loves to sit at hobby desk and use computer.        OT Problem List: Decreased strength;Decreased range of motion;Decreased activity tolerance;Impaired balance (sitting and/or standing);Decreased safety awareness;Decreased knowledge of use of DME or AE;Decreased knowledge of precautions;Pain      OT Treatment/Interventions: Self-care/ADL training;Therapeutic exercise;Energy conservation;DME and/or AE instruction;Therapeutic activities;Patient/family education;Balance training    OT Goals(Current goals can be found in the care plan section) Acute Rehab OT Goals Patient Stated Goal: to get better OT Goal Formulation: With patient/family Time For Goal Achievement: 04/24/17 Potential to Achieve Goals: Good ADL Goals Pt Will Perform Grooming: with supervision;standing Pt Will Perform Lower Body Bathing: sit to/from stand;with supervision Pt Will Perform Lower Body Dressing: with supervision;sit to/from stand Pt Will Transfer to Toilet: with supervision;bedside commode (BSC over toilet) Pt Will Perform Toileting - Clothing Manipulation and hygiene: with supervision;sit to/from stand;with adaptive equipment Additional ADL Goal #1: Pt will generalize 3/3 back precautions during daily ADL routine.   OT Frequency: Min 2X/week   Barriers to D/C:            Co-evaluation              AM-PAC PT "6 Clicks" Daily Activity     Outcome Measure Help from another person eating meals?: None Help from another person taking care of personal grooming?: A Little Help from another person toileting, which includes using toliet, bedpan, or urinal?: A Little Help from another person bathing (including washing, rinsing, drying)?: A Lot Help from another person to put  on and taking off regular upper body clothing?: A Little Help from another person to put on and taking off regular lower body clothing?: A Lot 6 Click Score: 17   End of Session Equipment Utilized  During Treatment: Gait belt Nurse Communication: Mobility status  Activity Tolerance: Patient tolerated treatment well Patient left: Other (comment) (ambulating with PT)  OT Visit Diagnosis: Unsteadiness on feet (R26.81);Other abnormalities of gait and mobility (R26.89);Muscle weakness (generalized) (M62.81);Pain Pain - Right/Left: Right Pain - part of body: Leg (back)                Time: 3735-7897 OT Time Calculation (min): 20 min Charges:  OT General Charges $OT Visit: 1 Procedure OT Evaluation $OT Eval Moderate Complexity: 1 Procedure G-Codes:     Norman Herrlich, MS OTR/L  Pager: [place] 04/10/2017, 10:09 AM

## 2017-04-10 NOTE — Progress Notes (Signed)
Patient alert and oriented, mae's well, voiding adequate amount of urine, swallowing without difficulty, c/o mild pain and medication already given. Patient discharged home with family. Script and discharged instructions given to patient. Patient and family stated understanding of instructions given. Patient has an appointment with Dr. Ronnald Ramp

## 2017-04-10 NOTE — Progress Notes (Signed)
Physical Therapy Treatment Patient Details Name: [name] MRN: [id number] DOB: 09/15/1946 Today's Date: 04/10/2017    History of Present Illness Patient is a 71 y/o male s/p L2-3, 3-4, 4-5 lami with L5-S1 fusion. PMH includes HTN, HLD, glaucoma, DM, CAD, cancer.    PT Comments    Pt presents with increased c/o pain this session and is unable to ambulate greater than 20' before having increased pain in RLE and low back and requesting to lay back down. Pt will benefit from attempt to perform gait with RW in order to improve his mobility and assist with safety when returning home. Pt will return to focus on gait with RW and increase distance before discharge.     Follow Up Recommendations  Home health PT     Equipment Recommendations  Rolling walker with 5" wheels;3in1 (PT)    Recommendations for Other Services       Precautions / Restrictions Precautions Precautions: Fall;Back Precaution Booklet Issued: Yes (comment) Precaution Comments: Reviewed handout provided by PT and back precautions related to ADL. Pt able to verbalize 3/3 precautions.  Restrictions Weight Bearing Restrictions: No    Mobility  Bed Mobility Overal bed mobility: Needs Assistance Bed Mobility: Rolling;Sidelying to Sit Rolling: Min guard Sidelying to sit: Min guard       General bed mobility comments: Increased time and effort to perform mobility including bringing trunk upright at EOB  Transfers Overall transfer level: Needs assistance Equipment used: Straight cane (2 walking sticks) Transfers: Sit to/from Stand Sit to Stand: Min assist;From elevated surface         General transfer comment: Min assist for lifting from elevated bed   Ambulation/Gait Ambulation/Gait assistance: Min assist Ambulation Distance (Feet): 20 Feet Assistive device: Straight cane;None (two walking sticks) Gait Pattern/deviations: Step-through pattern;Decreased stride length Gait velocity: decreased Gait  velocity interpretation: Below normal speed for age/gender General Gait Details: slow, unsteady gait with increased c/o pain in RLE with mobility. pt is only able to perform 20' before becoming fatigued and requesting to return to bed   Stairs            Wheelchair Mobility    Modified Rankin (Stroke Patients Only)       Balance Overall balance assessment: Needs assistance Sitting-balance support: Feet supported;No upper extremity supported Sitting balance-Leahy Scale: Good     Standing balance support: Single extremity supported;Bilateral upper extremity supported;During functional activity Standing balance-Leahy Scale: Poor Standing balance comment: Requires UE support or external assist for safety with standing ADL.                             Cognition Arousal/Alertness: Awake/alert Behavior During Therapy: WFL for tasks assessed/performed Overall Cognitive Status: Within Functional Limits for tasks assessed                                        Exercises      General Comments        Pertinent Vitals/Pain Pain Assessment: 0-10 Faces Pain Scale: Hurts whole lot Pain Location: back and R LE Pain Descriptors / Indicators: Sore;Operative site guarding Pain Intervention(s): Monitored during session;Repositioned;Patient requesting pain meds-RN notified    Home Living Family/patient expects to be discharged to:: Private residence Living Arrangements: Spouse/significant other Available Help at Discharge: Family;Available 24 hours/day Type of Home: House Home Access: Stairs to enter Entrance  Stairs-Rails: Right Home Layout: Two level;Able to live on main level with bedroom/bathroom Home Equipment: Shower seat;Hand held shower head;Grab bars - tub/shower;Walker - 2 wheels (walking sticks)      Prior Function Level of Independence: Independent with assistive device(s)      Comments: For the last few weeks, pt using walking sticks  for support. Loves to sit at hobby desk and use computer.   PT Goals (current goals can now be found in the care plan section) Acute Rehab PT Goals Patient Stated Goal: to get better Progress towards PT goals: Progressing toward goals    Frequency    Min 5X/week      PT Plan Discharge plan needs to be updated    Co-evaluation              AM-PAC PT "6 Clicks" Daily Activity  Outcome Measure  Difficulty turning over in bed (including adjusting bedclothes, sheets and blankets)?: A Little Difficulty moving from lying on back to sitting on the side of the bed? : A Little Difficulty sitting down on and standing up from a chair with arms (e.g., wheelchair, bedside commode, etc,.)?: A Lot Help needed moving to and from a bed to chair (including a wheelchair)?: A Lot Help needed walking in hospital room?: A Lot Help needed climbing 3-5 steps with a railing? : A Lot 6 Click Score: 14    End of Session Equipment Utilized During Treatment: Gait belt Activity Tolerance: Patient limited by pain Patient left: in bed;with call bell/phone within reach;with family/visitor present Nurse Communication: Mobility status;Patient requests pain meds PT Visit Diagnosis: Unsteadiness on feet (R26.81);Pain Pain - part of body:  (back)     Time: 6144-3154 PT Time Calculation (min) (ACUTE ONLY): 12 min  Charges:  $Therapeutic Activity: 8-22 mins                    G Codes:       Scheryl Marten PT, DPT  [phone]   Jacqulyn Liner Sloan Leiter 04/10/2017, 11:56 AM

## 2017-04-12 DIAGNOSIS — E119 Type 2 diabetes mellitus without complications: Secondary | ICD-10-CM | POA: Diagnosis not present

## 2017-04-12 DIAGNOSIS — Z7984 Long term (current) use of oral hypoglycemic drugs: Secondary | ICD-10-CM | POA: Diagnosis not present

## 2017-04-12 DIAGNOSIS — I2510 Atherosclerotic heart disease of native coronary artery without angina pectoris: Secondary | ICD-10-CM | POA: Diagnosis not present

## 2017-04-12 DIAGNOSIS — Z4789 Encounter for other orthopedic aftercare: Secondary | ICD-10-CM | POA: Diagnosis not present

## 2017-04-12 DIAGNOSIS — I10 Essential (primary) hypertension: Secondary | ICD-10-CM | POA: Diagnosis not present

## 2017-04-12 DIAGNOSIS — Z7982 Long term (current) use of aspirin: Secondary | ICD-10-CM | POA: Diagnosis not present

## 2017-04-12 DIAGNOSIS — E785 Hyperlipidemia, unspecified: Secondary | ICD-10-CM | POA: Diagnosis not present

## 2017-04-13 DIAGNOSIS — I10 Essential (primary) hypertension: Secondary | ICD-10-CM | POA: Diagnosis not present

## 2017-04-13 DIAGNOSIS — E119 Type 2 diabetes mellitus without complications: Secondary | ICD-10-CM | POA: Diagnosis not present

## 2017-04-13 DIAGNOSIS — Z4789 Encounter for other orthopedic aftercare: Secondary | ICD-10-CM | POA: Diagnosis not present

## 2017-04-13 DIAGNOSIS — Z7982 Long term (current) use of aspirin: Secondary | ICD-10-CM | POA: Diagnosis not present

## 2017-04-13 DIAGNOSIS — I2510 Atherosclerotic heart disease of native coronary artery without angina pectoris: Secondary | ICD-10-CM | POA: Diagnosis not present

## 2017-04-13 DIAGNOSIS — E785 Hyperlipidemia, unspecified: Secondary | ICD-10-CM | POA: Diagnosis not present

## 2017-04-14 DIAGNOSIS — Z7982 Long term (current) use of aspirin: Secondary | ICD-10-CM | POA: Diagnosis not present

## 2017-04-14 DIAGNOSIS — E119 Type 2 diabetes mellitus without complications: Secondary | ICD-10-CM | POA: Diagnosis not present

## 2017-04-14 DIAGNOSIS — Z4789 Encounter for other orthopedic aftercare: Secondary | ICD-10-CM | POA: Diagnosis not present

## 2017-04-14 DIAGNOSIS — E785 Hyperlipidemia, unspecified: Secondary | ICD-10-CM | POA: Diagnosis not present

## 2017-04-14 DIAGNOSIS — I2510 Atherosclerotic heart disease of native coronary artery without angina pectoris: Secondary | ICD-10-CM | POA: Diagnosis not present

## 2017-04-14 DIAGNOSIS — I10 Essential (primary) hypertension: Secondary | ICD-10-CM | POA: Diagnosis not present

## 2017-04-16 DIAGNOSIS — Z4789 Encounter for other orthopedic aftercare: Secondary | ICD-10-CM | POA: Diagnosis not present

## 2017-04-16 DIAGNOSIS — E785 Hyperlipidemia, unspecified: Secondary | ICD-10-CM | POA: Diagnosis not present

## 2017-04-16 DIAGNOSIS — I10 Essential (primary) hypertension: Secondary | ICD-10-CM | POA: Diagnosis not present

## 2017-04-16 DIAGNOSIS — I2510 Atherosclerotic heart disease of native coronary artery without angina pectoris: Secondary | ICD-10-CM | POA: Diagnosis not present

## 2017-04-16 DIAGNOSIS — E119 Type 2 diabetes mellitus without complications: Secondary | ICD-10-CM | POA: Diagnosis not present

## 2017-04-16 DIAGNOSIS — Z7982 Long term (current) use of aspirin: Secondary | ICD-10-CM | POA: Diagnosis not present

## 2017-04-19 DIAGNOSIS — Z7982 Long term (current) use of aspirin: Secondary | ICD-10-CM | POA: Diagnosis not present

## 2017-04-19 DIAGNOSIS — E785 Hyperlipidemia, unspecified: Secondary | ICD-10-CM | POA: Diagnosis not present

## 2017-04-19 DIAGNOSIS — E119 Type 2 diabetes mellitus without complications: Secondary | ICD-10-CM | POA: Diagnosis not present

## 2017-04-19 DIAGNOSIS — Z4789 Encounter for other orthopedic aftercare: Secondary | ICD-10-CM | POA: Diagnosis not present

## 2017-04-19 DIAGNOSIS — I2510 Atherosclerotic heart disease of native coronary artery without angina pectoris: Secondary | ICD-10-CM | POA: Diagnosis not present

## 2017-04-19 DIAGNOSIS — I10 Essential (primary) hypertension: Secondary | ICD-10-CM | POA: Diagnosis not present

## 2017-04-21 DIAGNOSIS — I10 Essential (primary) hypertension: Secondary | ICD-10-CM | POA: Diagnosis not present

## 2017-04-21 DIAGNOSIS — E119 Type 2 diabetes mellitus without complications: Secondary | ICD-10-CM | POA: Diagnosis not present

## 2017-04-21 DIAGNOSIS — Z4789 Encounter for other orthopedic aftercare: Secondary | ICD-10-CM | POA: Diagnosis not present

## 2017-04-21 DIAGNOSIS — I2510 Atherosclerotic heart disease of native coronary artery without angina pectoris: Secondary | ICD-10-CM | POA: Diagnosis not present

## 2017-04-21 DIAGNOSIS — Z7982 Long term (current) use of aspirin: Secondary | ICD-10-CM | POA: Diagnosis not present

## 2017-04-21 DIAGNOSIS — E785 Hyperlipidemia, unspecified: Secondary | ICD-10-CM | POA: Diagnosis not present

## 2017-04-25 ENCOUNTER — Other Ambulatory Visit: Payer: Self-pay | Admitting: Physician Assistant

## 2017-04-26 DIAGNOSIS — I10 Essential (primary) hypertension: Secondary | ICD-10-CM | POA: Diagnosis not present

## 2017-04-26 DIAGNOSIS — E119 Type 2 diabetes mellitus without complications: Secondary | ICD-10-CM | POA: Diagnosis not present

## 2017-04-26 DIAGNOSIS — I2510 Atherosclerotic heart disease of native coronary artery without angina pectoris: Secondary | ICD-10-CM | POA: Diagnosis not present

## 2017-04-26 DIAGNOSIS — Z4789 Encounter for other orthopedic aftercare: Secondary | ICD-10-CM | POA: Diagnosis not present

## 2017-04-26 DIAGNOSIS — Z7982 Long term (current) use of aspirin: Secondary | ICD-10-CM | POA: Diagnosis not present

## 2017-04-26 DIAGNOSIS — E785 Hyperlipidemia, unspecified: Secondary | ICD-10-CM | POA: Diagnosis not present

## 2017-04-29 ENCOUNTER — Other Ambulatory Visit: Payer: Self-pay | Admitting: Physician Assistant

## 2017-04-30 DIAGNOSIS — Z7982 Long term (current) use of aspirin: Secondary | ICD-10-CM | POA: Diagnosis not present

## 2017-04-30 DIAGNOSIS — E119 Type 2 diabetes mellitus without complications: Secondary | ICD-10-CM | POA: Diagnosis not present

## 2017-04-30 DIAGNOSIS — I2510 Atherosclerotic heart disease of native coronary artery without angina pectoris: Secondary | ICD-10-CM | POA: Diagnosis not present

## 2017-04-30 DIAGNOSIS — Z4789 Encounter for other orthopedic aftercare: Secondary | ICD-10-CM | POA: Diagnosis not present

## 2017-04-30 DIAGNOSIS — E785 Hyperlipidemia, unspecified: Secondary | ICD-10-CM | POA: Diagnosis not present

## 2017-04-30 DIAGNOSIS — I10 Essential (primary) hypertension: Secondary | ICD-10-CM | POA: Diagnosis not present

## 2017-04-30 MED ORDER — KETOCONAZOLE 2 % EX CREA: 1.0000 "application " | TOPICAL_CREAM | Freq: Two times a day (BID) | CUTANEOUS | 0 refills | Status: DC

## 2017-04-30 NOTE — Addendum Note (Signed)
Addended by: Wardell Honour on: 04/30/2017 11:39 AM   Modules accepted: Orders

## 2017-05-04 DIAGNOSIS — E119 Type 2 diabetes mellitus without complications: Secondary | ICD-10-CM | POA: Diagnosis not present

## 2017-05-04 DIAGNOSIS — I10 Essential (primary) hypertension: Secondary | ICD-10-CM | POA: Diagnosis not present

## 2017-05-04 DIAGNOSIS — I2510 Atherosclerotic heart disease of native coronary artery without angina pectoris: Secondary | ICD-10-CM | POA: Diagnosis not present

## 2017-05-04 DIAGNOSIS — Z4789 Encounter for other orthopedic aftercare: Secondary | ICD-10-CM | POA: Diagnosis not present

## 2017-05-04 DIAGNOSIS — Z7982 Long term (current) use of aspirin: Secondary | ICD-10-CM | POA: Diagnosis not present

## 2017-05-04 DIAGNOSIS — E785 Hyperlipidemia, unspecified: Secondary | ICD-10-CM | POA: Diagnosis not present

## 2017-05-06 DIAGNOSIS — I2510 Atherosclerotic heart disease of native coronary artery without angina pectoris: Secondary | ICD-10-CM | POA: Diagnosis not present

## 2017-05-06 DIAGNOSIS — E785 Hyperlipidemia, unspecified: Secondary | ICD-10-CM | POA: Diagnosis not present

## 2017-05-06 DIAGNOSIS — Z4789 Encounter for other orthopedic aftercare: Secondary | ICD-10-CM | POA: Diagnosis not present

## 2017-05-06 DIAGNOSIS — E119 Type 2 diabetes mellitus without complications: Secondary | ICD-10-CM | POA: Diagnosis not present

## 2017-05-06 DIAGNOSIS — I10 Essential (primary) hypertension: Secondary | ICD-10-CM | POA: Diagnosis not present

## 2017-05-06 DIAGNOSIS — Z7982 Long term (current) use of aspirin: Secondary | ICD-10-CM | POA: Diagnosis not present

## 2017-05-28 ENCOUNTER — Encounter: Payer: Self-pay | Admitting: Family Medicine

## 2017-06-01 ENCOUNTER — Ambulatory Visit: Payer: Medicare Other | Admitting: Family Medicine

## 2017-06-04 ENCOUNTER — Encounter: Payer: Self-pay | Admitting: Family Medicine

## 2017-06-04 ENCOUNTER — Ambulatory Visit (INDEPENDENT_AMBULATORY_CARE_PROVIDER_SITE_OTHER): Payer: Medicare Other | Admitting: Family Medicine

## 2017-06-04 VITALS — BP 118/61 | HR 64 | Temp 97.9°F | Resp 17 | Ht 72.5 in | Wt 204.0 lb

## 2017-06-04 DIAGNOSIS — K6289 Other specified diseases of anus and rectum: Secondary | ICD-10-CM | POA: Diagnosis not present

## 2017-06-04 DIAGNOSIS — L729 Follicular cyst of the skin and subcutaneous tissue, unspecified: Secondary | ICD-10-CM | POA: Diagnosis not present

## 2017-06-04 DIAGNOSIS — L089 Local infection of the skin and subcutaneous tissue, unspecified: Secondary | ICD-10-CM

## 2017-06-04 MED ORDER — DOXYCYCLINE HYCLATE 100 MG PO TABS: 100.0000 mg | ORAL_TABLET | Freq: Two times a day (BID) | ORAL | 0 refills | Status: DC

## 2017-06-04 NOTE — Progress Notes (Signed)
Subjective:    Patient ID: [name], male    DOB: June 25, 1946, 71 y.o.   MRN: [id number]  HPI [name] is a 71 y.o. male  Presents today for left sided perianal lesion. Started with soreness 3 nights ago, then more swollen past few days. Dried blood yesterday am and slight expressed blood or pus this morning but too sore to express. Some sebum, yellow material and blood. History of perineal abscess - thought to be infected sebaceous cyst on 03/18/17. Involved perineum with pustule at that time, treated with doxycycline and wound opened for purulence expression. Improved with antibiotics. 1st episode of infection around anus. Blood sugars 120-130's.   Attempted mupirocin ointment tid past 2 days, after cleaning area with wiping and flushable wipes.    Has social event tonight, and will be sitting most of the night.   Patient Active Problem List   Diagnosis Date Noted  . S/P lumbar spinal fusion 04/09/2017  . Type 2 diabetes mellitus without complication, without long-term current use of insulin ([place]) 02/14/2016  . Degenerative disc disease, lumbar 02/14/2016  . HTN (hypertension) 02/22/2012  . Dyslipidemia 02/22/2012  . Obstructive sleep apnea 10/14/2010  . Migraine headache 10/14/2010  . GLAUCOMA 10/14/2010  . CAD 10/14/2010  . DIVERTICULAR DISEASE 10/14/2010   Past Medical History:  Diagnosis Date  . Arthritis    DDD, scoliosis, stenosis    . Atherosclerotic heart disease native coronary artery w/angina pectoris ([place])   . Calcium blood increased   . Cancer (HCC)    skin ca, posterior R ear   ([place])  . Complication of anesthesia    difficulty intubation , anterior larynx, easy mask ventilation  . Coronary artery disease   . Diabetes mellitus without complication ([place])   . Difficult intubation   . Diverticulitis   . Glaucoma   . Headache(784.0)    migraines  . History of kidney stones   . Hyperlipemia   . Hypertension   . Sleep apnea    CPAP still in  use q night, & for naps thru out the day, pt. reports that last study was neg for sleep apnea but he still uses the device    . Spinal stenosis   . Ventricular dilatation    Right   Past Surgical History:  Procedure Laterality Date  . APPENDECTOMY  1991  . BOWEL RESECTION  1985  . CARDIAC CATHETERIZATION  2006  . CATARACT EXTRACTION Right   . CHOLECYSTECTOMY  1990  . COLON SURGERY     bowel resection, for diverticulitis   . COLONOSCOPY N/A 06/15/2013   Procedure: COLONOSCOPY;  Surgeon: Beryle Beams, MD;  Location: [facility];  Service: Endoscopy;  Laterality: N/A;  . CORONARY ANGIOPLASTY     STENT PLACED  . LAMINECTOMY WITH POSTERIOR LATERAL ARTHRODESIS LEVEL 1 N/A 04/09/2017   Procedure: Lumbar TwoThree,Lumbar Three-Four,Lumbar Four-Five Laminectomy with Lumbar Five-Sacral One Posterior lateral fusion;  Surgeon: Eustace Moore, MD;  Location: [facility];  Service: Neurosurgery;  Laterality: N/A;  . LITHOTRIPSY    . SPINAL CORD STIMULATOR INSERTION N/A 03/20/2016   Procedure: LUMBAR SPINAL CORD STIMULATOR INSERTION;  Surgeon: Clydell Hakim, MD;  Location: [facility];  Service: Neurosurgery;  Laterality: N/A;  . TONSILLECTOMY    . [place] ?   Allergies  Allergen Reactions  . Amaryl Other (See Comments)    Gives pt migraines  . Other Other (See Comments)    Pistachios, lima  beans, and some types of chocolate cause migraines  Nasal corticosteroids causes nose bleeds  . Skelaxin [Metaxalone] Rash    Cherry red rash with sloughing off of skin at left upper chest/shoulder  . Doc-Q-Lax [Digital Fever Thermometer] Other (See Comments)    Headache   . Hydrocodone     Ineffective   . Pistachio Nut (Diagnostic) Other (See Comments)     In  Addition :Lima beans, milk chocolate   . Levofloxacin Nausea Only  . Lisinopril Other (See Comments)    cough  . Metformin And Related Diarrhea   Prior to Admission medications   Medication Sig Start Date End Date Taking?  Authorizing Provider  acetaminophen (TYLENOL) 650 MG CR tablet Take 650 mg by mouth 2 (two) times daily.   Yes [provider]  amLODipine (NORVASC) 10 MG tablet TAKE 1 TABLET BY MOUTH DAILY 05/04/17  Yes Wardell Honour, MD  aspirin 81 MG tablet Take 81 mg by mouth every evening.    Yes [provider]  bimatoprost (LUMIGAN) 0.01 % SOLN Place 1 drop into both eyes every evening.   Yes [provider]  brimonidine-timolol (COMBIGAN) 0.2-0.5 % ophthalmic solution Place 1 drop into both eyes 2 (two) times daily.   Yes [provider]  brinzolamide (AZOPT) 1 % ophthalmic suspension Place 1 drop into both eyes 2 (two) times daily.    Yes [provider]  chlorthalidone (HYGROTON) 25 MG tablet TAKE 1 TABLET(25 MG) BY MOUTH DAILY 04/26/17  Yes Wardell Honour, MD  Cholecalciferol (VITAMIN D3) 5000 units CAPS Take 5,000 Units by mouth every Monday, Wednesday, and Friday.   Yes [provider]  Coenzyme Q10 (COQ-10) 200 MG CAPS Take 200 mg by mouth daily.   Yes [provider]  doxycycline (VIBRA-TABS) 100 MG tablet Take 1 tablet (100 mg total) by mouth 2 (two) times daily. 03/18/17  Yes Weber, Sarah L, PA-C  empagliflozin (JARDIANCE) 25 MG TABS tablet Take 25 mg by mouth daily. Patient taking differently: Take 25 mg by mouth every evening.  02/02/17  Yes Wardell Honour, MD  Famotidine-Ca Carb-Mag Hydrox [facility] COMPLETE PO) Take 1 tablet by mouth daily as needed (acid reflux).   Yes [provider]  glucose blood (FREESTYLE LITE) test strip Test blood sugar once daily. Dx: E11.9 05/21/16  Yes Wardell Honour, MD  ketoconazole (NIZORAL) 2 % cream Apply 1 application topically 2 (two) times daily. 04/30/17  Yes Wardell Honour, MD  Lancets (FREESTYLE) lancets Test blood sugar once daily. Dx: E11.9 05/21/16  Yes Wardell Honour, MD  methocarbamol (ROBAXIN) 500 MG tablet Take 1 tablet (500 mg total) by mouth every 6 (six) hours as needed for muscle  spasms. 04/10/17  Yes Jovita Gamma, MD  metoprolol tartrate (LOPRESSOR) 25 MG tablet Take 25 mg by mouth 2 (two) times daily.   Yes [provider]  Multiple Vitamins-Minerals (PRESERVISION AREDS 2) CAPS Take 1 capsule by mouth 2 (two) times daily.   Yes [provider]  mupirocin ointment (BACTROBAN) 2 % Apply 1 application topically 2 (two) times daily. Patient taking differently: Apply 1 application topically daily as needed (skin infections).  03/18/17  Yes Weber, Sarah L, PA-C  Omega-3 Fatty Acids (FISH OIL) 1200 MG CAPS Take 1,200 mg by mouth 2 (two) times daily.   Yes [provider]  OVER THE COUNTER MEDICATION Take 1,000 mg by mouth daily. microlactin otc supplement   Yes [provider]  oxyCODONE-acetaminophen (PERCOCET/ROXICET) 5-325  MG tablet Take 1-2 tablets by mouth every 4 (four) hours as needed for moderate pain. 04/10/17  Yes Jovita Gamma, MD  rosuvastatin (CRESTOR) 10 MG tablet Take 1 tablet (10 mg total) by mouth daily. 02/02/17  Yes Wardell Honour, MD  sitaGLIPtin (JANUVIA) 100 MG tablet Take 1 tablet (100 mg total) by mouth daily. 02/02/17  Yes Wardell Honour, MD  sodium chloride (OCEAN) 0.65 % SOLN nasal spray Place 1 spray into both nostrils as needed (dryness).   Yes [provider]  SUMAtriptan (IMITREX) 50 MG tablet TAKE 1 TABLET BY MOUTH EVERY 2 HOURS AS NEEDED FOR MIGRAINE 03/05/17  Yes Wardell Honour, MD  SUPER B COMPLEX/C PO Take 1 tablet by mouth daily.   Yes [provider]  tolnaftate (TINACTIN) 1 % cream Apply 1 application topically daily as needed (athlete's foot).   Yes [provider]   Social History   Social History  . Marital status: Married    Spouse name: N/A  . Number of children: 0  . Years of education: Masters +   Occupational History  . Retired    Social History Main Topics  . Smoking status: Never Smoker  . Smokeless tobacco: Never Used  . Alcohol use 0.0 oz/week      Comment: seldom   . Drug use: No  . Sexual activity: Yes   Other Topics Concern  . Not on file   Social History Narrative   Marital status: married x 1969      Children: none      Lives: with wife, 1 dog      Employment: retired at age 58.  Education music      Tobacco: none      Alcohol: rare      Exercise: stationary bike in 2018; rare use in 2018 due to sick dog.      ADLs: independent with ADLs; drives      Advanced Directives:    2 cups of coffee a day, soda or tea about 3 times a week.      Review of Systems  Constitutional: Negative for chills and fever.  Gastrointestinal: Positive for anal bleeding (in area of bump, no other recent anal bleeding o/w. ). Negative for abdominal pain, constipation, diarrhea, nausea and vomiting.       No new abdominal symptoms. Occasional constipation once per month.   Skin: Positive for wound.       Objective:   Physical Exam  Constitutional: He is oriented to person, place, and time. He appears well-developed and well-nourished.  HENT:  Head: Normocephalic.  Cardiovascular: Normal rate.   Pulmonary/Chest: Effort normal.  Abdominal: Soft. Bowel sounds are normal. He exhibits no distension. There is no tenderness.  Genitourinary: Rectal exam shows no external hemorrhoid and no fissure.     Neurological: He is alert and oriented to person, place, and time.  Skin: Skin is warm and dry.  Psychiatric: He has a normal mood and affect. His behavior is normal.   Vitals:   06/04/17 1026  BP: 118/61  Pulse: 64  Resp: 17  Temp: 97.9 F (36.6 C)  TempSrc: Oral  SpO2: 98%  Weight: 204 lb (92.5 kg)  Height: 6' 0.5" (1.842 m)        Assessment & Plan:   [name] is a 71 y.o. male Perianal cyst - Plan: doxycycline (VIBRA-TABS) 100 MG tablet, WOUND CULTURE  Infected cyst of skin - Plan: doxycycline (VIBRA-TABS) 100 MG tablet, WOUND  CULTURE  Appears to be early infected peri-anal cyst, epidermal cyst versus sebaceous  with other noninfected similar appearing lesions next to affected area. Similar lesions responded to doxycycline in the past.   -Start doxycycline. Do not feel there is a need for incision and drainage of affected area today, but would work on warm compresses/warm soaks and RTC precautions discussed.  -If recurrence of infection in this area, would recommend meeting with colorectal surgeon as excision of affected area may be needed, or consult for management options in the future. Meds ordered this encounter  Medications  . doxycycline (VIBRA-TABS) 100 MG tablet    Sig: Take 1 tablet (100 mg total) by mouth 2 (two) times daily.    Dispense:  20 tablet    Refill:  0   Patient Instructions   Area of swelling appears to be infected cyst. Does not appear to need incision at this time, but start antibiotic, warm soaks and gentle pressure to express pus 3-4 times per day, and follow up if increased swelling or worsening. If these areas get infected again, would recommend meeting with colorectal specialist to discuss possible surgical removal of affected areas.   Return to the clinic or go to the nearest emergency room if any of your symptoms worsen or new symptoms occur.    IF you received an x-ray today, you will receive an invoice from [facility]. Please contact [facility] at [phone] with questions or concerns regarding your invoice.   IF you received labwork today, you will receive an invoice from [place]. Please contact [facility] at [phone] with questions or concerns regarding your invoice.   Our billing staff will not be able to assist you with questions regarding bills from these companies.  You will be contacted with the lab results as soon as they are available. The fastest way to get your results is to activate your My Chart account. Instructions are located on the last page of this paperwork. If you have not heard from [place] regarding the results in 2 weeks,  please contact this office.       Signed,   [name], MD [facility].  06/06/17 4:36 PM

## 2017-06-04 NOTE — Patient Instructions (Addendum)
Area of swelling appears to be infected cyst. Does not appear to need incision at this time, but start antibiotic, warm soaks and gentle pressure to express pus 3-4 times per day, and follow up if increased swelling or worsening. If these areas get infected again, would recommend meeting with colorectal specialist to discuss possible surgical removal of affected areas.   Return to the clinic or go to the nearest emergency room if any of your symptoms worsen or new symptoms occur.    IF you received an x-ray today, you will receive an invoice from [facility]. Please contact [facility] at [phone] with questions or concerns regarding your invoice.   IF you received labwork today, you will receive an invoice from [place]. Please contact [facility] at [phone] with questions or concerns regarding your invoice.   Our billing staff will not be able to assist you with questions regarding bills from these companies.  You will be contacted with the lab results as soon as they are available. The fastest way to get your results is to activate your My Chart account. Instructions are located on the last page of this paperwork. If you have not heard from [place] regarding the results in 2 weeks, please contact this office.

## 2017-06-06 LAB — WOUND CULTURE: Organism ID, Bacteria: NONE SEEN

## 2017-07-05 DIAGNOSIS — M48062 Spinal stenosis, lumbar region with neurogenic claudication: Secondary | ICD-10-CM | POA: Diagnosis not present

## 2017-07-05 DIAGNOSIS — Z6828 Body mass index (BMI) 28.0-28.9, adult: Secondary | ICD-10-CM | POA: Diagnosis not present

## 2017-08-05 ENCOUNTER — Other Ambulatory Visit: Payer: Self-pay | Admitting: Family Medicine

## 2017-08-10 ENCOUNTER — Encounter: Payer: Self-pay | Admitting: Family Medicine

## 2017-08-10 ENCOUNTER — Ambulatory Visit (INDEPENDENT_AMBULATORY_CARE_PROVIDER_SITE_OTHER): Payer: Medicare Other | Admitting: Family Medicine

## 2017-08-10 VITALS — BP 120/72 | HR 82 | Temp 98.0°F | Resp 16 | Ht 72.0 in | Wt 203.0 lb

## 2017-08-10 DIAGNOSIS — Z23 Encounter for immunization: Secondary | ICD-10-CM | POA: Diagnosis not present

## 2017-08-10 DIAGNOSIS — G43709 Chronic migraine without aura, not intractable, without status migrainosus: Secondary | ICD-10-CM | POA: Diagnosis not present

## 2017-08-10 DIAGNOSIS — G4733 Obstructive sleep apnea (adult) (pediatric): Secondary | ICD-10-CM | POA: Diagnosis not present

## 2017-08-10 DIAGNOSIS — E785 Hyperlipidemia, unspecified: Secondary | ICD-10-CM

## 2017-08-10 DIAGNOSIS — I209 Angina pectoris, unspecified: Secondary | ICD-10-CM

## 2017-08-10 DIAGNOSIS — M5136 Other intervertebral disc degeneration, lumbar region: Secondary | ICD-10-CM

## 2017-08-10 DIAGNOSIS — Z0000 Encounter for general adult medical examination without abnormal findings: Secondary | ICD-10-CM | POA: Diagnosis not present

## 2017-08-10 DIAGNOSIS — I25118 Atherosclerotic heart disease of native coronary artery with other forms of angina pectoris: Secondary | ICD-10-CM | POA: Diagnosis not present

## 2017-08-10 DIAGNOSIS — I10 Essential (primary) hypertension: Secondary | ICD-10-CM | POA: Diagnosis not present

## 2017-08-10 DIAGNOSIS — Z981 Arthrodesis status: Secondary | ICD-10-CM

## 2017-08-10 DIAGNOSIS — E119 Type 2 diabetes mellitus without complications: Secondary | ICD-10-CM

## 2017-08-10 DIAGNOSIS — H6122 Impacted cerumen, left ear: Secondary | ICD-10-CM | POA: Diagnosis not present

## 2017-08-10 LAB — GLUCOSE, POCT (MANUAL RESULT ENTRY): POC GLUCOSE: 100 mg/dL — AB (ref 70–99)

## 2017-08-10 LAB — POCT URINALYSIS DIP (MANUAL ENTRY)
Bilirubin, UA: NEGATIVE
Blood, UA: NEGATIVE
Ketones, POC UA: NEGATIVE mg/dL
Leukocytes, UA: NEGATIVE
Nitrite, UA: NEGATIVE
PROTEIN UA: NEGATIVE mg/dL
SPEC GRAV UA: 1.015 (ref 1.010–1.025)
UROBILINOGEN UA: 0.2 U/dL
pH, UA: 7 (ref 5.0–8.0)

## 2017-08-10 LAB — POCT GLYCOSYLATED HEMOGLOBIN (HGB A1C): Hemoglobin A1C: 6.1

## 2017-08-10 MED ORDER — SITAGLIPTIN PHOSPHATE 100 MG PO TABS: 100.0000 mg | ORAL_TABLET | Freq: Every day | ORAL | 3 refills | Status: DC

## 2017-08-10 MED ORDER — ZOSTER VAC RECOMB ADJUVANTED 50 MCG/0.5ML IM SUSR: 0.5000 mL | Freq: Once | INTRAMUSCULAR | 1 refills | Status: AC

## 2017-08-10 MED ORDER — EMPAGLIFLOZIN 25 MG PO TABS: 25.0000 mg | ORAL_TABLET | Freq: Every day | ORAL | 3 refills | Status: DC

## 2017-08-10 NOTE — Patient Instructions (Signed)
     IF you received an x-ray today, you will receive an invoice from [facility]. Please contact [facility] at [phone] with questions or concerns regarding your invoice.   IF you received labwork today, you will receive an invoice from [facility]. Please contact [facility] at [phone] with questions or concerns regarding your invoice.   Our billing staff will not be able to assist you with questions regarding bills from these companies.  You will be contacted with the lab results as soon as they are available. The fastest way to get your results is to activate your My Chart account. Instructions are located on the last page of this paperwork. If you have not heard from us regarding the results in 2 weeks, please contact this office.     

## 2017-08-10 NOTE — Progress Notes (Signed)
Subjective:    Patient ID: [name], male    DOB: [birth year], 71 y.o.   MRN: [id number]  08/10/2017  Diabetes and Hypertension   HPI This 71 y.o. male presents for [place] and  six month follow-up for DMII, dyslipidemia, hypertension, DDD lumbar spine.  Back problems, knee problems.  S/p surgery with Dr. Ronnald Ramp of [facility].  Patient was expected to turn back on post-operatively while recovering from anesthesia.  Dr. Ronnald Ramp does not own nerve stimulator; his colleague owns the device.  When meets with technician about adjusting pain location; learns something; only controls nerve pain and not mechanical pain.  There is high turnover with technicians.  Like a fishing expedition; let's see if can find sweet spot.  Implanted device does not work as well as external device.  Pain severity on an average day;for patient question is total quality of life.  Never cries/screams with pain.  Now still able to walk down hall where PRIOR to surgery, could not walk down hall.  Now, cannot stand for more than a few minutes, has pain just above pelvis.  R lumbar spine is well controlled with nerve stimulator.  Excessive pain with nerve that innervates medial thigh and groin.  Stenosis/arthritis, collapsed disc, off-set vertebra.  Everything is wrong.  Surgery in May 2018 was for spinal stenosis.  As of last month, no improvement since surgery; Jones recommended giving it more time.  In the meantime, R knee has really started hurting horribly.  Scheduled with Dr. Carlota Raspberry this week.  Has some swelling medially; pain in superior lateral aspect of knee.    Has started drinking grapejuice with pectin; drinking cherry juice.  Gout does not keep awake.  Poor dog is taking forever to die.  Got a pedicure last month.  Sugar is 130 on average; decreased after surgery; decreased to 105.  Eating more fruit and less processed sugar.  Not eating chips.   Not checking BP at home.   Last physical: not  sure Colonoscopy: 2014   BP Readings from Last 3 Encounters:  08/12/17 110/64  08/10/17 120/72  06/04/17 118/61   Wt Readings from Last 3 Encounters:  08/12/17 203 lb 12.8 oz (92.4 kg)  08/10/17 203 lb (92.1 kg)  06/04/17 204 lb (92.5 kg)   Immunization History  Administered Date(s) Administered  . Influenza, Seasonal, Injecte, Preservative Fre 02/02/2013  . Influenza,inj,Quad PF,6+ Mos 09/05/2013, 11/22/2014, 07/25/2015, 08/29/2016, 08/10/2017  . Pneumococcal Conjugate-13 11/22/2014  . Pneumococcal Polysaccharide-23 06/03/2016  . Zoster 11/22/2014    Review of Systems  Constitutional: Negative for activity change, appetite change, chills, diaphoresis, fatigue, fever and unexpected weight change.  HENT: Positive for hearing loss. Negative for congestion, dental problem, drooling, ear discharge, ear pain, facial swelling, mouth sores, nosebleeds, postnasal drip, rhinorrhea, sinus pressure, sneezing, sore throat, tinnitus, trouble swallowing and voice change.   Eyes: Negative for photophobia, pain, discharge, redness, itching and visual disturbance.  Respiratory: Negative for apnea, cough, choking, chest tightness, shortness of breath, wheezing and stridor.   Cardiovascular: Negative for chest pain, palpitations and leg swelling.  Gastrointestinal: Negative for abdominal pain, blood in stool, constipation, diarrhea, nausea and vomiting.  Endocrine: Negative for cold intolerance, heat intolerance, polydipsia, polyphagia and polyuria.  Genitourinary: Negative for decreased urine volume, difficulty urinating, discharge, dysuria, enuresis, flank pain, frequency, genital sores, hematuria, penile pain, penile swelling, scrotal swelling, testicular pain and urgency.       Nocturia x 1.  Urine stream is weaker early morning.  Strong stream during the day.  Musculoskeletal: Positive for arthralgias and back pain. Negative for gait problem, joint swelling, myalgias, neck pain and neck stiffness.   Skin: Negative for color change, pallor, rash and wound.  Allergic/Immunologic: Negative for environmental allergies, food allergies and immunocompromised state.  Neurological: Positive for headaches. Negative for dizziness, tremors, seizures, syncope, facial asymmetry, speech difficulty, weakness, light-headedness and numbness.  Hematological: Negative for adenopathy. Does not bruise/bleed easily.  Psychiatric/Behavioral: Negative for agitation, behavioral problems, confusion, decreased concentration, dysphoric mood, hallucinations, self-injury, sleep disturbance and suicidal ideas. The patient is not nervous/anxious and is not hyperactive.        Bedtime 1030-1200; wakes up with dog 0200-0400, 0530. Wakes up at 0600-0800.    Past Medical History:  Diagnosis Date  . Arthritis    DDD, scoliosis, stenosis    . 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)   . Calcium blood increased   . Cancer (HCC)    skin ca, posterior R ear   (BENIGN)  . Complication of anesthesia    difficulty intubation , anterior larynx, easy mask ventilation  . Coronary artery disease   . Diabetes mellitus without complication ([place])   . Difficult intubation   . Diverticulitis   . Glaucoma   . Headache(784.0)    migraines  . History of kidney stones   . Hyperlipemia   . Hypertension   . Sleep apnea    CPAP still in use q night, & for naps thru out the day, pt. reports that last study was neg for sleep apnea but he still uses the device    . Spinal stenosis   . Ventricular dilatation    Right   Past Surgical History:  Procedure Laterality Date  . APPENDECTOMY  1991  . BOWEL RESECTION  1985  . CARDIAC CATHETERIZATION  2006  . CATARACT EXTRACTION Right   . CHOLECYSTECTOMY  1990  . COLON SURGERY     bowel resection, for diverticulitis   . COLONOSCOPY N/A 06/15/2013   Procedure: COLONOSCOPY;  Surgeon: Beryle Beams, MD;  Location: [facility];  Service: Endoscopy;   Laterality: N/A;  . CORONARY ANGIOPLASTY     STENT PLACED  . LAMINECTOMY WITH POSTERIOR LATERAL ARTHRODESIS LEVEL 1 N/A 04/09/2017   Procedure: Lumbar TwoThree,Lumbar Three-Four,Lumbar Four-Five Laminectomy with Lumbar Five-Sacral One Posterior lateral fusion;  Surgeon: Eustace Moore, MD;  Location: [facility];  Service: Neurosurgery;  Laterality: N/A;  . LITHOTRIPSY    . SPINAL CORD STIMULATOR INSERTION N/A 03/20/2016   Procedure: LUMBAR SPINAL CORD STIMULATOR INSERTION;  Surgeon: Clydell Hakim, MD;  Location: [facility];  Service: Neurosurgery;  Laterality: N/A;  . TONSILLECTOMY    . [place] ?   Allergies  Allergen Reactions  . Amaryl Other (See Comments)    Gives pt migraines  . Other Other (See Comments)    Pistachios, lima beans, and some types of chocolate cause migraines  Nasal corticosteroids causes nose bleeds  . Skelaxin [Metaxalone] Rash    Cherry red rash with sloughing off of skin at left upper chest/shoulder  . Doc-Q-Lax [Digital Fever Thermometer] Other (See Comments)    Headache   . Hydrocodone     Ineffective   . Pistachio Nut (Diagnostic) Other (See Comments)     In  Addition :Lima beans, milk chocolate   . Levofloxacin Nausea Only  . Lisinopril Other (See Comments)    cough  . Metformin And Related Diarrhea  Social History   Social History  . Marital status: Married    Spouse name: N/A  . Number of children: 0  . Years of education: Masters +   Occupational History  . Retired    Social History Main Topics  . Smoking status: Never Smoker  . Smokeless tobacco: Never Used  . Alcohol use 0.0 oz/week     Comment: seldom   . Drug use: No  . Sexual activity: Yes   Other Topics Concern  . Not on file   Social History Narrative   Marital status: married x 1969      Children: none      Lives: with wife, 1 dog      Employment: retired at age 11.  Education music      Tobacco: none      Alcohol: rare      Exercise: stationary  bike in 2018; rare use in 2018 due to sick dog.      ADLs: independent with ADLs; drives      Advanced Directives:    2 cups of coffee a day, soda or tea about 3 times a week.    Family History  Problem Relation Age of Onset  . Heart disease Father   . Heart attack Father   . Heart disease Paternal Grandmother        Objective:    BP 120/72   Pulse 82   Temp 98 F (36.7 C) (Oral)   Resp 16   Ht 6' (1.829 m)   Wt 203 lb (92.1 kg)   SpO2 96%   BMI 27.53 kg/m  Physical Exam  Constitutional: He is oriented to person, place, and time. He appears well-developed and well-nourished. No distress.  HENT:  Head: Normocephalic and atraumatic.  Right Ear: External ear normal.  Left Ear: External ear normal.  Nose: Nose normal.  Mouth/Throat: Oropharynx is clear and moist.  Cerumen impaction  Eyes: Pupils are equal, round, and reactive to light. Conjunctivae and EOM are normal.  Neck: Normal range of motion. Neck supple. Carotid bruit is not present. No thyromegaly present.  Cardiovascular: Normal rate, regular rhythm, normal heart sounds and intact distal pulses.  Exam reveals no gallop and no friction rub.   No murmur heard. Pulmonary/Chest: Effort normal and breath sounds normal. He has no wheezes. He has no rales.  Abdominal: Soft. Bowel sounds are normal. He exhibits no distension and no mass. There is no tenderness. There is no rebound and no guarding.  Musculoskeletal:       Right shoulder: Normal.       Left shoulder: Normal.       Cervical back: Normal.  Lymphadenopathy:    He has no cervical adenopathy.  Neurological: He is alert and oriented to person, place, and time. He has normal reflexes. No cranial nerve deficit. He exhibits normal muscle tone. Coordination normal.  Skin: Skin is warm and dry. No rash noted. He is not diaphoretic.  Psychiatric: He has a normal mood and affect. His behavior is normal. Judgment and thought content normal.    No results  found. Depression screen [facility] 2/9 08/10/2017 06/04/2017 03/18/2017 02/02/2017 08/29/2016  Decreased Interest 0 0 0 0 0  Down, Depressed, Hopeless 0 0 0 0 0  PHQ - 2 Score 0 0 0 0 0   Fall Risk  08/10/2017 06/04/2017 03/18/2017 02/02/2017 08/29/2016  Falls in the past year? Yes No Yes Yes No  Number falls in past yr: 2 or more -  2 or more 2 or more -  Injury with Fall? No - No No -  Risk for fall due to : - - History of fall(s) - -  Follow up - - Falls evaluation completed - -   Functional Status Survey: Is the patient deaf or have difficulty hearing?: No Does the patient have difficulty seeing, even when wearing glasses/contacts?: No Does the patient have difficulty concentrating, remembering, or making decisions?: No Does the patient have difficulty walking or climbing stairs?: Yes Does the patient have difficulty dressing or bathing?: No Does the patient have difficulty doing errands alone such as visiting a doctor's office or shopping?: No     Assessment & Plan:   1. Encounter for Medicare annual wellness exam   2. Type 2 diabetes mellitus without complication, without long-term current use of insulin ([place])   3. Dyslipidemia   4. Essential hypertension   5. Need for prophylactic vaccination and inoculation against influenza   6. Hearing loss due to cerumen impaction, left   7. Atherosclerosis of native coronary artery of native heart with stable angina pectoris ([place])   8. Chronic migraine without aura without status migrainosus, not intractable   9. Obstructive sleep apnea   10. Degenerative disc disease, lumbar   11. S/P lumbar spinal fusion    -anticipatory guidance provided --- exercise, weight loss, safe driving practices, aspirin 81mg  daily. -obtain age appropriate screening labs and labs for chronic disease management. -moderate fall risk due to DDD lumbar spine; no evidence of depression; no evidence of hearing loss.  Discussed advanced directives and living will; also discussed  end of life issues including code status.  -new onset cerumen impaction; s/p ear irrigation during visit of LEFT ear. -recovering from recent NS procedure; suffering with significant lower back pain despite nerve stimulator.  Negatively affecting quality of life yet no depression at this itme.   Orders Placed This Encounter  Procedures  . Flu Vaccine QUAD 36+ mos IM  . Microalbumin, urine  . CBC with Differential/Platelet  . Comprehensive metabolic panel  . Lipid panel  . Ear wax removal    left  . POCT glycosylated hemoglobin (Hb A1C)  . POCT glucose (manual entry)  . POCT urinalysis dipstick   Meds ordered this encounter  Medications  . Zoster Vac Recomb Adjuvanted [facility]) injection    Sig: Inject 0.5 mLs into the muscle once.    Dispense:  0.5 mL    Refill:  1  . ibuprofen (ADVIL,MOTRIN) 200 MG tablet    Sig: Take 200 mg by mouth 3 (three) times daily.  . sitaGLIPtin (JANUVIA) 100 MG tablet    Sig: Take 1 tablet (100 mg total) by mouth daily.    Dispense:  90 tablet    Refill:  3  . empagliflozin (JARDIANCE) 25 MG TABS tablet    Sig: Take 25 mg by mouth daily.    Dispense:  90 tablet    Refill:  3    Return in about 6 months (around 02/07/2018) for recheck diabetes, blood pressure.   Kekoa Fyock Elayne Guerin, M.D. Primary Care at [facility] previously [facility] [address] [place], [place]  [address] [phone] phone [phone] fax

## 2017-08-11 LAB — LIPID PANEL
CHOL/HDL RATIO: 4.2 ratio (ref 0.0–5.0)
Cholesterol, Total: 146 mg/dL (ref 100–199)
HDL: 35 mg/dL — ABNORMAL LOW (ref >39)
LDL CALC: 70 mg/dL (ref 0–99)
TRIGLYCERIDES: 205 mg/dL — AB (ref 0–149)
VLDL Cholesterol Cal: 41 mg/dL — ABNORMAL HIGH (ref 5–40)

## 2017-08-11 LAB — COMPREHENSIVE METABOLIC PANEL
A/G RATIO: 2.3 — AB (ref 1.2–2.2)
ALBUMIN: 4.5 g/dL (ref 3.5–4.8)
ALT: 25 IU/L (ref 0–44)
AST: 22 IU/L (ref 0–40)
Alkaline Phosphatase: 60 IU/L (ref 39–117)
BUN / CREAT RATIO: 19 (ref 10–24)
BUN: 18 mg/dL (ref 8–27)
Bilirubin Total: 0.4 mg/dL (ref 0.0–1.2)
CALCIUM: 10.5 mg/dL — AB (ref 8.6–10.2)
CO2: 29 mmol/L (ref 20–29)
Chloride: 96 mmol/L (ref 96–106)
Creatinine, Ser: 0.95 mg/dL (ref 0.76–1.27)
GFR, EST AFRICAN AMERICAN: 93 mL/min/{1.73_m2} (ref >59)
GFR, EST NON AFRICAN AMERICAN: 80 mL/min/{1.73_m2} (ref >59)
GLOBULIN, TOTAL: 2 g/dL (ref 1.5–4.5)
Glucose: 120 mg/dL — ABNORMAL HIGH (ref 65–99)
POTASSIUM: 3.6 mmol/L (ref 3.5–5.2)
SODIUM: 141 mmol/L (ref 134–144)
Total Protein: 6.5 g/dL (ref 6.0–8.5)

## 2017-08-11 LAB — CBC WITH DIFFERENTIAL/PLATELET
BASOS ABS: 0 10*3/uL (ref 0.0–0.2)
Basos: 0 %
EOS (ABSOLUTE): 0.1 10*3/uL (ref 0.0–0.4)
Eos: 2 %
HEMOGLOBIN: 16 g/dL (ref 13.0–17.7)
Hematocrit: 44.7 % (ref 37.5–51.0)
IMMATURE GRANS (ABS): 0 10*3/uL (ref 0.0–0.1)
IMMATURE GRANULOCYTES: 0 %
LYMPHS: 27 %
Lymphocytes Absolute: 1.5 10*3/uL (ref 0.7–3.1)
MCH: 32.6 pg (ref 26.6–33.0)
MCHC: 35.8 g/dL — ABNORMAL HIGH (ref 31.5–35.7)
MCV: 91 fL (ref 79–97)
MONOCYTES: 9 %
Monocytes Absolute: 0.5 10*3/uL (ref 0.1–0.9)
NEUTROS PCT: 62 %
Neutrophils Absolute: 3.4 10*3/uL (ref 1.4–7.0)
PLATELETS: 175 10*3/uL (ref 150–379)
RBC: 4.91 x10E6/uL (ref 4.14–5.80)
RDW: 13.7 % (ref 12.3–15.4)
WBC: 5.5 10*3/uL (ref 3.4–10.8)

## 2017-08-11 LAB — MICROALBUMIN, URINE: MICROALBUM., U, RANDOM: 5.5 ug/mL

## 2017-08-12 ENCOUNTER — Ambulatory Visit (INDEPENDENT_AMBULATORY_CARE_PROVIDER_SITE_OTHER): Payer: Medicare Other

## 2017-08-12 ENCOUNTER — Encounter: Payer: Self-pay | Admitting: Family Medicine

## 2017-08-12 ENCOUNTER — Ambulatory Visit (INDEPENDENT_AMBULATORY_CARE_PROVIDER_SITE_OTHER): Payer: Medicare Other | Admitting: Family Medicine

## 2017-08-12 VITALS — BP 110/64 | HR 74 | Temp 98.0°F | Resp 16 | Ht 72.0 in | Wt 203.8 lb

## 2017-08-12 DIAGNOSIS — M25561 Pain in right knee: Secondary | ICD-10-CM | POA: Diagnosis not present

## 2017-08-12 DIAGNOSIS — M179 Osteoarthritis of knee, unspecified: Secondary | ICD-10-CM | POA: Diagnosis not present

## 2017-08-12 NOTE — Progress Notes (Signed)
Subjective:  This chart was scribed for Tony Agreste, MD by Tamsen Roers, at [facility].  This patient was seen in room 10 and the patient's care was started at 11:32 AM.   Chief Complaint  Patient presents with  . Knee Pain    for about a year, started swelling last week      Patient ID: [name], male    DOB: 04/29/1946, 71 y.o.   MRN: [id number]  HPI HPI Comments: [name] is a 71 y.o. male who presents to Primary Care at [facility] for dull right knee pain (above the knee cap) and has gradually moved downwards -for the past year. He noticed intermittent swelling about 1 week ago and has been taking ibuprofen about every 6 hours for the past couple weeks as the pain has gotten "a lot worse".  Patients knee feels "best" when he is standing up and putting weight on it and worsens when he sits for a long time/ is not able to move it for long hours.   Patient was told that he had right knee arthritis by Dr. Joseph Art and received an injection July 2017.  The injection helped him about 2-3 months.  Patient would not like pain medication.  He currently has a stent and has a family history of heart disease. Patient has been to [facility] in the past.     Patient Active Problem List   Diagnosis Date Noted  . S/P lumbar spinal fusion 04/09/2017  . Type 2 diabetes mellitus without complication, without long-term current use of insulin ([place]) 02/14/2016  . Degenerative disc disease, lumbar 02/14/2016  . HTN (hypertension) 02/22/2012  . Dyslipidemia 02/22/2012  . Obstructive sleep apnea 10/14/2010  . Migraine headache 10/14/2010  . GLAUCOMA 10/14/2010  . CAD 10/14/2010  . DIVERTICULAR DISEASE 10/14/2010   Past Medical History:  Diagnosis Date  . Arthritis    DDD, scoliosis, stenosis    . Atherosclerotic heart disease native coronary artery w/angina pectoris ([place])   . Calcium blood increased   . Cancer (HCC)    skin ca, posterior R ear    ([place])  . Complication of anesthesia    difficulty intubation , anterior larynx, easy mask ventilation  . Coronary artery disease   . Diabetes mellitus without complication ([place])   . Difficult intubation   . Diverticulitis   . Glaucoma   . Headache(784.0)    migraines  . History of kidney stones   . Hyperlipemia   . Hypertension   . Sleep apnea    CPAP still in use q night, & for naps thru out the day, pt. reports that last study was neg for sleep apnea but he still uses the device    . Spinal stenosis   . Ventricular dilatation    Right   Past Surgical History:  Procedure Laterality Date  . APPENDECTOMY  1991  . BOWEL RESECTION  1985  . CARDIAC CATHETERIZATION  2006  . CATARACT EXTRACTION Right   . CHOLECYSTECTOMY  1990  . COLON SURGERY     bowel resection, for diverticulitis   . COLONOSCOPY N/A 06/15/2013   Procedure: COLONOSCOPY;  Surgeon: Beryle Beams, MD;  Location: [facility];  Service: Endoscopy;  Laterality: N/A;  . CORONARY ANGIOPLASTY     STENT PLACED  . LAMINECTOMY WITH POSTERIOR LATERAL ARTHRODESIS LEVEL 1 N/A 04/09/2017   Procedure: Lumbar TwoThree,Lumbar Three-Four,Lumbar Four-Five Laminectomy with Lumbar Five-Sacral One Posterior lateral fusion;  Surgeon: Sherley Bounds  S, MD;  Location: [facility];  Service: Neurosurgery;  Laterality: N/A;  . LITHOTRIPSY    . SPINAL CORD STIMULATOR INSERTION N/A 03/20/2016   Procedure: LUMBAR SPINAL CORD STIMULATOR INSERTION;  Surgeon: Clydell Hakim, MD;  Location: [facility];  Service: Neurosurgery;  Laterality: N/A;  . TONSILLECTOMY    . [place] ?   Allergies  Allergen Reactions  . Amaryl Other (See Comments)    Gives pt migraines  . Other Other (See Comments)    Pistachios, lima beans, and some types of chocolate cause migraines  Nasal corticosteroids causes nose bleeds  . Skelaxin [Metaxalone] Rash    Cherry red rash with sloughing off of skin at left upper chest/shoulder  . Doc-Q-Lax [Digital  Fever Thermometer] Other (See Comments)    Headache   . Hydrocodone     Ineffective   . Pistachio Nut (Diagnostic) Other (See Comments)     In  Addition :Lima beans, milk chocolate   . Levofloxacin Nausea Only  . Lisinopril Other (See Comments)    cough  . Metformin And Related Diarrhea   Prior to Admission medications   Medication Sig Start Date End Date Taking? Authorizing Provider  acetaminophen (TYLENOL) 650 MG CR tablet Take 650 mg by mouth 2 (two) times daily.    [provider]  amLODipine (NORVASC) 10 MG tablet TAKE 1 TABLET BY MOUTH DAILY Patient taking differently: TAKE 1/4 TABLET BY MOUTH DAILY 05/04/17   Wardell Honour, MD  aspirin 81 MG tablet Take 81 mg by mouth every evening.     [provider]  bimatoprost (LUMIGAN) 0.01 % SOLN Place 1 drop into both eyes every evening.    [provider]  brimonidine-timolol (COMBIGAN) 0.2-0.5 % ophthalmic solution Place 1 drop into both eyes 2 (two) times daily.    [provider]  brinzolamide (AZOPT) 1 % ophthalmic suspension Place 1 drop into both eyes 2 (two) times daily.     [provider]  chlorthalidone (HYGROTON) 25 MG tablet TAKE 1 TABLET(25 MG) BY MOUTH DAILY 04/26/17   Wardell Honour, MD  Cholecalciferol (VITAMIN D3) 5000 units CAPS Take 5,000 Units by mouth every Monday, Wednesday, and Friday.    [provider]  Coenzyme Q10 (COQ-10) 200 MG CAPS Take 200 mg by mouth daily.    [provider]  empagliflozin (JARDIANCE) 25 MG TABS tablet Take 25 mg by mouth daily. 08/10/17   Wardell Honour, MD  Famotidine-Ca Carb-Mag Hydrox (PEPCID COMPLETE PO) Take 1 tablet by mouth daily as needed (acid reflux).    [provider]  FREESTYLE LITE test strip USE TO TEST BLOOD SUGAR ONCE DAILY 08/07/17   Wardell Honour, MD  ibuprofen (ADVIL,MOTRIN) 200 MG tablet Take 200 mg by mouth 3 (three) times daily.    [provider]  ketoconazole (NIZORAL) 2 % cream  Apply 1 application topically 2 (two) times daily. 04/30/17   Wardell Honour, MD  Lancets (FREESTYLE) lancets USE TO TEST BLOOD SUGAR ONCE DAILY 08/07/17   Wardell Honour, MD  methocarbamol (ROBAXIN) 500 MG tablet Take 1 tablet (500 mg total) by mouth every 6 (six) hours as needed for muscle spasms. 04/10/17   Jovita Gamma, MD  metoprolol tartrate (LOPRESSOR) 25 MG tablet Take 25 mg by mouth 2 (two) times daily.    [provider]  Multiple Vitamins-Minerals (PRESERVISION AREDS 2) CAPS Take 1 capsule by mouth 2 (two) times daily.  [provider]  mupirocin ointment (BACTROBAN) 2 % Apply 1 application topically 2 (two) times daily. Patient taking differently: Apply 1 application topically daily as needed (skin infections).  03/18/17   Weber, Damaris Hippo, PA-C  Omega-3 Fatty Acids (FISH OIL) 1200 MG CAPS Take 1,200 mg by mouth 2 (two) times daily.    [provider]  OVER THE COUNTER MEDICATION Take 1,000 mg by mouth daily. microlactin otc supplement    [provider]  rosuvastatin (CRESTOR) 10 MG tablet Take 1 tablet (10 mg total) by mouth daily. 02/02/17   Wardell Honour, MD  sitaGLIPtin (JANUVIA) 100 MG tablet Take 1 tablet (100 mg total) by mouth daily. 08/10/17   Wardell Honour, MD  sodium chloride (OCEAN) 0.65 % SOLN nasal spray Place 1 spray into both nostrils as needed (dryness).    [provider]  SUMAtriptan (IMITREX) 50 MG tablet TAKE 1 TABLET BY MOUTH EVERY 2 HOURS AS NEEDED FOR MIGRAINE 03/05/17   Wardell Honour, MD  SUPER B COMPLEX/C PO Take 1 tablet by mouth daily.    [provider]   Social History   Social History  . Marital status: Married    Spouse name: N/A  . Number of children: 0  . Years of education: Masters +   Occupational History  . Retired    Social History Main Topics  . Smoking status: Never Smoker  . Smokeless tobacco: Never Used  . Alcohol use 0.0 oz/week     Comment: seldom   . Drug use: No  .  Sexual activity: Yes   Other Topics Concern  . Not on file   Social History Narrative   Marital status: married x 1969      Children: none      Lives: with wife, 1 dog      Employment: retired at age 76.  Education music      Tobacco: none      Alcohol: rare      Exercise: stationary bike in 2018; rare use in 2018 due to sick dog.      ADLs: independent with ADLs; drives      Advanced Directives:    2 cups of coffee a day, soda or tea about 3 times a week.       Review of Systems  Musculoskeletal:       Right knee pain       Objective:   Physical Exam  Constitutional: He appears well-developed and well-nourished. No distress.  HENT:  Head: Normocephalic and atraumatic.  Pulmonary/Chest: Effort normal. No respiratory distress.  Musculoskeletal:  Right knee: no focal bony tenderness, skin is intact, no erythema, no rash.Full extension, full flexion, an ache sensation but unchanged with mc murray. negative varus/valgus. Positive clarks compression testing.  No appreciable joint effusion. No apparent J sign.   Neurological: He is alert.  Skin: Skin is warm and dry.  Psychiatric: He has a normal mood and affect.    Vitals:   08/12/17 1100  BP: 110/64  Pulse: 74  Resp: 16  Temp: 98 F (36.7 C)  SpO2: 96%  Weight: 203 lb 12.8 oz (92.4 kg)  Height: 6' (1.829 m)   Dg Knee Complete 4 Views Right  Result Date: 08/12/2017 CLINICAL DATA:  Nine month history of right-sided patellofemoral joint pain. No known injury. EXAM: RIGHT KNEE - COMPLETE 4+ VIEW COMPARISON:  None in PACs FINDINGS: The bones are subjectively adequately mineralized. The joint spaces are well maintained. There  are spurs arising from the superior and inferior articular margins of the patella. There is no joint effusion. There is no acute fracture or dislocation. IMPRESSION: Mild degenerative spurring of the patella consistent with early osteoarthritic change. No significant joint space loss. No acute bony  abnormality. Electronically Signed   By: David  Martinique M.D.   On: 08/12/2017 12:27         Assessment & Plan:   LAVELLE AKEL is a 71 y.o. male Right knee pain, unspecified chronicity - Plan: DG Knee Complete 4 Views Right, Ambulatory referral to Orthopedic Surgery, Brace application  Pain of right patellofemoral joint - Plan: DG Knee Complete 4 Views Right, Ambulatory referral to Orthopedic Surgery  Suspected patellofemoral pain syndrome, no appreciable effusion presently  -Web reaction brace placed, handout on patellofemoral pain, refer to orthopedics as likely physical therapy plus or minus injection. Decided against additional NSAID or prolonged NSAID given cardiac risks.  -RTC precautions if effusion returns for possible aspiration and injection at that time.  No orders of the defined types were placed in this encounter.  Patient Instructions    Currently your pain appears to be due to patellofemoral pain. See information on this condition below. Physical therapy may be helpful, but I will refer you to orthopedics to decide if injections are needed or just physical therapy. For now would recommend Tylenol over-the-counter initially for pain is a be the safest, occasional ibuprofen only, or tramadol if needed for more severe pain. Try the web reaction brace as well as that may provide some relief . Let me know if you need a refill of tramadol. If the knee joint does swell again, return and I may be able to draw some fluid off and possibly inject the knee at that time.  Return to the clinic or go to the nearest emergency room if any of your symptoms worsen or new symptoms occur.   Patellofemoral Pain Syndrome Patellofemoral pain syndrome is a condition that involves a softening or breakdown of the tissue (cartilage) on the underside of your kneecap (patella). This causes pain in the front of the knee. The condition is also called runner's knee or chondromalacia patella.  Patellofemoral pain syndrome is most common in young adults who are active in sports. Your knee is the largest joint in your body. The patella covers the front of your knee and is attached to muscles above and below your knee. The underside of the patella is covered with a smooth type of cartilage (synovium). The smooth surface helps the patella glide easily when you move your knee. Patellofemoral pain syndrome causes swelling in the joint linings and bone surfaces in your knee. What are the causes? Patellofemoral pain syndrome can be caused by:  Overuse.  Poor alignment of your knee joints.  Weak leg muscles.  A direct blow to your kneecap.  What increases the risk? You may be at risk for patellofemoral pain syndrome if you:  Do a lot of activities that can wear down your kneecap. These include: ? Running. ? Squatting. ? Climbing stairs.  Start a new physical activity or exercise program.  Wear shoes that do not fit well.  Do not have good leg strength.  Are overweight.  What are the signs or symptoms? Knee pain is the most common symptom of patellofemoral pain syndrome. This may feel like a dull, aching pain underneath your patella, in the front of your knee. There may be a popping or cracking sound when you move your  knee. Pain may get worse with:  Exercise.  Climbing stairs.  Running.  Jumping.  Squatting.  Kneeling.  Sitting for a long time.  Moving or pushing on your patella.  How is this diagnosed? Your health care provider may be able to diagnose patellofemoral pain syndrome from your symptoms and medical history. You may be asked about your recent physical activities and which ones cause knee pain. Your health care provider may do a physical exam with certain tests to confirm the diagnosis. These may include:  Moving your patella back and forth.  Checking your range of knee motion.  Having you squat or jump to see if you have pain.  Checking the  strength of your leg muscles.  An MRI of the knee may also be done. How is this treated? Patellofemoral pain syndrome can usually be treated at home with rest, ice, compression, and elevation (RICE). Other treatments may include:  Nonsteroidal anti-inflammatory drugs (NSAIDs).  Physical therapy to stretch and strengthen your leg muscles.  Shoe inserts (orthotics) to take stress off your knee.  A knee brace or knee support.  Surgery to remove damaged cartilage or move the patella to a better position. The need for surgery is rare.  Follow these instructions at home:  Take medicines only as directed by your health care provider.  Rest your knee. ? When resting, keep your knee raised above the level of your heart. ? Avoid activities that cause knee pain.  Apply ice to the injured area: ? Put ice in a plastic bag. ? Place a towel between your skin and the bag. ? Leave the ice on for 20 minutes, 2-3 times a day.  Use splints, braces, knee supports, or walking aids as directed by your health care provider.  Perform stretching and strengthening exercises as directed by your health care provider or physical therapist.  Keep all follow-up visits as directed by your health care provider. This is important. Contact a health care provider if:  Your symptoms get worse.  You are not improving with home care. This information is not intended to replace advice given to you by your health care provider. Make sure you discuss any questions you have with your health care provider. Document Released: 10/21/2009 Document Revised: 04/09/2016 Document Reviewed: 01/22/2014 Elsevier Interactive Patient Education  2018 [employer].   IF you received an x-ray today, you will receive an invoice from [facility]. Please contact [facility] at [phone] with questions or concerns regarding your invoice.   IF you received labwork today, you will receive an invoice from [place].  Please contact [facility] at [phone] with questions or concerns regarding your invoice.   Our billing staff will not be able to assist you with questions regarding bills from these companies.  You will be contacted with the lab results as soon as they are available. The fastest way to get your results is to activate your My Chart account. Instructions are located on the last page of this paperwork. If you have not heard from [place] regarding the results in 2 weeks, please contact this office.       I personally performed the services described in this documentation, which was scribed in my presence. The recorded information has been reviewed and considered for accuracy and completeness, addended by me as needed, and agree with information above.  Signed,   [name], MD Primary [facility] at [facility].  08/12/17 1:32 PM

## 2017-08-12 NOTE — Patient Instructions (Addendum)
Currently your pain appears to be due to patellofemoral pain. See information on this condition below. Physical therapy may be helpful, but I will refer you to orthopedics to decide if injections are needed or just physical therapy. For now would recommend Tylenol over-the-counter initially for pain is a be the safest, occasional ibuprofen only, or tramadol if needed for more severe pain. Try the web reaction brace as well as that may provide some relief . Let me know if you need a refill of tramadol. If the knee joint does swell again, return and I may be able to draw some fluid off and possibly inject the knee at that time.  Return to the clinic or go to the nearest emergency room if any of your symptoms worsen or new symptoms occur.   Patellofemoral Pain Syndrome Patellofemoral pain syndrome is a condition that involves a softening or breakdown of the tissue (cartilage) on the underside of your kneecap (patella). This causes pain in the front of the knee. The condition is also called runner's knee or chondromalacia patella. Patellofemoral pain syndrome is most common in young adults who are active in sports. Your knee is the largest joint in your body. The patella covers the front of your knee and is attached to muscles above and below your knee. The underside of the patella is covered with a smooth type of cartilage (synovium). The smooth surface helps the patella glide easily when you move your knee. Patellofemoral pain syndrome causes swelling in the joint linings and bone surfaces in your knee. What are the causes? Patellofemoral pain syndrome can be caused by:  Overuse.  Poor alignment of your knee joints.  Weak leg muscles.  A direct blow to your kneecap.  What increases the risk? You may be at risk for patellofemoral pain syndrome if you:  Do a lot of activities that can wear down your kneecap. These include: ? Running. ? Squatting. ? Climbing stairs.  Start a new physical  activity or exercise program.  Wear shoes that do not fit well.  Do not have good leg strength.  Are overweight.  What are the signs or symptoms? Knee pain is the most common symptom of patellofemoral pain syndrome. This may feel like a dull, aching pain underneath your patella, in the front of your knee. There may be a popping or cracking sound when you move your knee. Pain may get worse with:  Exercise.  Climbing stairs.  Running.  Jumping.  Squatting.  Kneeling.  Sitting for a long time.  Moving or pushing on your patella.  How is this diagnosed? Your health care provider may be able to diagnose patellofemoral pain syndrome from your symptoms and medical history. You may be asked about your recent physical activities and which ones cause knee pain. Your health care provider may do a physical exam with certain tests to confirm the diagnosis. These may include:  Moving your patella back and forth.  Checking your range of knee motion.  Having you squat or jump to see if you have pain.  Checking the strength of your leg muscles.  An MRI of the knee may also be done. How is this treated? Patellofemoral pain syndrome can usually be treated at home with rest, ice, compression, and elevation (RICE). Other treatments may include:  Nonsteroidal anti-inflammatory drugs (NSAIDs).  Physical therapy to stretch and strengthen your leg muscles.  Shoe inserts (orthotics) to take stress off your knee.  A knee brace or knee support.  Surgery to  remove damaged cartilage or move the patella to a better position. The need for surgery is rare.  Follow these instructions at home:  Take medicines only as directed by your health care provider.  Rest your knee. ? When resting, keep your knee raised above the level of your heart. ? Avoid activities that cause knee pain.  Apply ice to the injured area: ? Put ice in a plastic bag. ? Place a towel between your skin and the  bag. ? Leave the ice on for 20 minutes, 2-3 times a day.  Use splints, braces, knee supports, or walking aids as directed by your health care provider.  Perform stretching and strengthening exercises as directed by your health care provider or physical therapist.  Keep all follow-up visits as directed by your health care provider. This is important. Contact a health care provider if:  Your symptoms get worse.  You are not improving with home care. This information is not intended to replace advice given to you by your health care provider. Make sure you discuss any questions you have with your health care provider. Document Released: 10/21/2009 Document Revised: 04/09/2016 Document Reviewed: 01/22/2014 Elsevier Interactive Patient Education  2018 [employer].   IF you received an x-ray today, you will receive an invoice from [facility]. Please contact [facility] at [phone] with questions or concerns regarding your invoice.   IF you received labwork today, you will receive an invoice from [place]. Please contact [facility] at [phone] with questions or concerns regarding your invoice.   Our billing staff will not be able to assist you with questions regarding bills from these companies.  You will be contacted with the lab results as soon as they are available. The fastest way to get your results is to activate your My Chart account. Instructions are located on the last page of this paperwork. If you have not heard from [place] regarding the results in 2 weeks, please contact this office.

## 2017-08-17 ENCOUNTER — Encounter: Payer: Self-pay | Admitting: Family Medicine

## 2017-08-18 ENCOUNTER — Other Ambulatory Visit: Payer: Self-pay | Admitting: Family Medicine

## 2017-08-18 ENCOUNTER — Telehealth: Payer: Self-pay | Admitting: Family Medicine

## 2017-08-18 DIAGNOSIS — M25561 Pain in right knee: Secondary | ICD-10-CM

## 2017-08-18 MED ORDER — TRAMADOL HCL ER 100 MG PO TB24: 100.0000 mg | ORAL_TABLET | Freq: Every day | ORAL | 0 refills | Status: DC

## 2017-08-18 NOTE — Progress Notes (Signed)
See [employer] email. Ultram Er for knee pain.

## 2017-08-18 NOTE — Telephone Encounter (Signed)
Pt. Called to inquire about the status of the prescription he asked Dr. Carlota Raspberry to fill by MyChart. He had called the [facility] [address]) and it was not available.

## 2017-08-19 NOTE — Telephone Encounter (Signed)
Rx-Tramadol faxed to [facility]

## 2017-09-01 DIAGNOSIS — M1710 Unilateral primary osteoarthritis, unspecified knee: Secondary | ICD-10-CM | POA: Diagnosis not present

## 2017-09-01 DIAGNOSIS — M170 Bilateral primary osteoarthritis of knee: Secondary | ICD-10-CM | POA: Diagnosis not present

## 2017-09-06 ENCOUNTER — Encounter: Payer: Self-pay | Admitting: Family Medicine

## 2017-09-06 ENCOUNTER — Ambulatory Visit (INDEPENDENT_AMBULATORY_CARE_PROVIDER_SITE_OTHER): Payer: Medicare Other | Admitting: Family Medicine

## 2017-09-06 ENCOUNTER — Ambulatory Visit (INDEPENDENT_AMBULATORY_CARE_PROVIDER_SITE_OTHER): Payer: Medicare Other

## 2017-09-06 VITALS — BP 110/62 | HR 67 | Temp 98.8°F | Resp 18 | Ht 72.0 in | Wt 199.8 lb

## 2017-09-06 DIAGNOSIS — M25562 Pain in left knee: Secondary | ICD-10-CM

## 2017-09-06 DIAGNOSIS — M25561 Pain in right knee: Secondary | ICD-10-CM | POA: Diagnosis not present

## 2017-09-06 DIAGNOSIS — I25118 Atherosclerotic heart disease of native coronary artery with other forms of angina pectoris: Secondary | ICD-10-CM | POA: Diagnosis not present

## 2017-09-06 DIAGNOSIS — M222X1 Patellofemoral disorders, right knee: Secondary | ICD-10-CM

## 2017-09-06 MED ORDER — TRAMADOL HCL ER 100 MG PO TB24: 100.0000 mg | ORAL_TABLET | Freq: Every day | ORAL | 0 refills | Status: DC

## 2017-09-06 NOTE — Progress Notes (Addendum)
Subjective:    Patient ID: [name], male    DOB: 05/07/1946, 71 y.o.   MRN: [id number]   [name] as a scribe for Tony Agreste, MD.,have documented all relevant documentation on the behalf of [name] R, MD,as directed by  Tony Agreste, MD while in the presence of Tony Agreste, MD.  Chief Complaint  Patient presents with  . Knee Pain    left knee  x25months     HPI  [name] is a 71 y.o. male who presents to Primary Care at [facility] complaining of Knee Pain    When last seen on 08/12/17 he was having right knee pain for approximately 1 year, but noticed swelling one week prior with worsening pain. He received an injection in 05/2016 that helped for 3 months. Xray showed spurring of the patella with early OA, otherwise no acute abnormality. Appeared to be patellofemoral pain. He was placed in a brace, tylenol over-the-counter and tramadol discussed if needed. He was also referred to orthopedics to decide on PT versus injection. See patient e-mail from 08/17/17, he was prescribed extended release tramadol.   Today he presents with left knee pain that has been on and off for the past year and has worsened the last 2-3 months. He did not seek injection treatment for his left knee like he had for his right knee. He recently saw [name]. [name] wants to use 50 mg Tramadol instead of the patient preferred 100 mg. Patient has been cutting the 50 mg in half but feels that does not help him. He hopes to return to 100 mg tramadol. The 100 mg tramadol allow him to go from 8 to 6 tylenol for his arthritis. He has a heart condition so he cannot use ibuprofen. He is willing to discuss short term anti-inflammatory with his cardiologist, Dr. Einar Bond. He has a bottle of #38 tramdol 50 mg tablets that he plans to turns into clinic when he receives his new prescription of Tramadol.  He did receive an injection in his right knee but this only helped for 1-2 nights. He  does have a PT appointment for evaluation on 09/10/17. [name] will see him back 10/20/17, patient notes [name] agreed with Dr. Vonna Bond diagnosis.     Patient Active Problem List   Diagnosis Date Noted  . S/P lumbar spinal fusion 04/09/2017  . Type 2 diabetes mellitus without complication, without long-term current use of insulin ([place]) 02/14/2016  . Degenerative disc disease, lumbar 02/14/2016  . HTN (hypertension) 02/22/2012  . Dyslipidemia 02/22/2012  . Obstructive sleep apnea 10/14/2010  . Migraine headache 10/14/2010  . GLAUCOMA 10/14/2010  . Coronary atherosclerosis 10/14/2010  . DIVERTICULAR DISEASE 10/14/2010   Past Medical History:  Diagnosis Date  . Arthritis    DDD, scoliosis, stenosis    . Atherosclerotic heart disease native coronary artery w/angina pectoris ([place])   . Calcium blood increased   . Cancer (HCC)    skin ca, posterior R ear   (BENIGN)  . Complication of anesthesia    difficulty intubation , anterior larynx, easy mask ventilation  . Coronary artery disease   . Diabetes mellitus without complication ([place])   . Difficult intubation   . Diverticulitis   . Glaucoma   . Headache(784.0)    migraines  . History of kidney stones   . Hyperlipemia   . Hypertension   . Sleep apnea    CPAP still in use q night, & for naps thru out  the day, pt. reports that last study was neg for sleep apnea but he still uses the device    . Spinal stenosis   . Ventricular dilatation    Right   Past Surgical History:  Procedure Laterality Date  . APPENDECTOMY  1991  . BOWEL RESECTION  1985  . CARDIAC CATHETERIZATION  2006  . CATARACT EXTRACTION Right   . CHOLECYSTECTOMY  1990  . COLON SURGERY     bowel resection, for diverticulitis   . COLONOSCOPY N/A 06/15/2013   Procedure: COLONOSCOPY;  Surgeon: Beryle Beams, MD;  Location: [facility];  Service: Endoscopy;  Laterality: N/A;  . CORONARY ANGIOPLASTY     STENT PLACED  . LAMINECTOMY WITH POSTERIOR LATERAL  ARTHRODESIS LEVEL 1 N/A 04/09/2017   Procedure: Lumbar TwoThree,Lumbar Three-Four,Lumbar Four-Five Laminectomy with Lumbar Five-Sacral One Posterior lateral fusion;  Surgeon: Eustace Moore, MD;  Location: [facility];  Service: Neurosurgery;  Laterality: N/A;  . LITHOTRIPSY    . SPINAL CORD STIMULATOR INSERTION N/A 03/20/2016   Procedure: LUMBAR SPINAL CORD STIMULATOR INSERTION;  Surgeon: Clydell Hakim, MD;  Location: [facility];  Service: Neurosurgery;  Laterality: N/A;  . TONSILLECTOMY    . [place] ?   Allergies  Allergen Reactions  . Amaryl Other (See Comments)    Gives pt migraines  . Other Other (See Comments)    Pistachios, lima beans, and some types of chocolate cause migraines  Nasal corticosteroids causes nose bleeds  . Skelaxin [Metaxalone] Rash    Cherry red rash with sloughing off of skin at left upper chest/shoulder  . Doc-Q-Lax [Digital Fever Thermometer] Other (See Comments)    Headache   . Hydrocodone     Ineffective   . Pistachio Nut (Diagnostic) Other (See Comments)     In  Addition :Lima beans, milk chocolate   . Levofloxacin Nausea Only  . Lisinopril Other (See Comments)    cough  . Metformin And Related Diarrhea   Prior to Admission medications   Medication Sig Start Date End Date Taking? Authorizing Provider  acetaminophen (TYLENOL) 650 MG CR tablet Take 650 mg by mouth 2 (two) times daily.   Yes [provider]  amLODipine (NORVASC) 10 MG tablet TAKE 1 TABLET BY MOUTH DAILY Patient taking differently: TAKE 1/4 TABLET BY MOUTH DAILY 05/04/17  Yes Wardell Honour, MD  aspirin 81 MG tablet Take 81 mg by mouth every evening.    Yes [provider]  bimatoprost (LUMIGAN) 0.01 % SOLN Place 1 drop into both eyes every evening.   Yes [provider]  brimonidine-timolol (COMBIGAN) 0.2-0.5 % ophthalmic solution Place 1 drop into both eyes 2 (two) times daily.   Yes [provider]  brinzolamide (AZOPT) 1 %  ophthalmic suspension Place 1 drop into both eyes 2 (two) times daily.    Yes [provider]  chlorthalidone (HYGROTON) 25 MG tablet TAKE 1 TABLET(25 MG) BY MOUTH DAILY 04/26/17  Yes Wardell Honour, MD  Cholecalciferol (VITAMIN D3) 5000 units CAPS Take 5,000 Units by mouth every Monday, Wednesday, and Friday.   Yes [provider]  Coenzyme Q10 (COQ-10) 200 MG CAPS Take 200 mg by mouth daily.   Yes [provider]  empagliflozin (JARDIANCE) 25 MG TABS tablet Take 25 mg by mouth daily. 08/10/17  Yes Wardell Honour, MD  Famotidine-Ca Carb-Mag Hydrox [facility] COMPLETE PO) Take 1 tablet by mouth daily as needed (acid reflux).   Yes [provider]  FREESTYLE LITE test strip USE TO TEST BLOOD SUGAR ONCE DAILY 08/07/17  Yes Wardell Honour, MD  ibuprofen (ADVIL,MOTRIN) 200 MG tablet Take 200 mg by mouth 3 (three) times daily.   Yes [provider]  ketoconazole (NIZORAL) 2 % cream Apply 1 application topically 2 (two) times daily. 04/30/17  Yes Wardell Honour, MD  Lancets (FREESTYLE) lancets USE TO TEST BLOOD SUGAR ONCE DAILY 08/07/17  Yes Wardell Honour, MD  methocarbamol (ROBAXIN) 500 MG tablet Take 1 tablet (500 mg total) by mouth every 6 (six) hours as needed for muscle spasms. 04/10/17  Yes Jovita Gamma, MD  metoprolol tartrate (LOPRESSOR) 25 MG tablet Take 25 mg by mouth 2 (two) times daily.   Yes [provider]  Multiple Vitamins-Minerals (PRESERVISION AREDS 2) CAPS Take 1 capsule by mouth 2 (two) times daily.   Yes [provider]  mupirocin ointment (BACTROBAN) 2 % Apply 1 application topically 2 (two) times daily. Patient taking differently: Apply 1 application topically daily as needed (skin infections).  03/18/17  Yes Weber, Sarah L, PA-C  Omega-3 Fatty Acids (FISH OIL) 1200 MG CAPS Take 1,200 mg by mouth 2 (two) times daily.   Yes [provider]  OVER THE COUNTER MEDICATION Take 1,000 mg by mouth daily. microlactin otc  supplement   Yes [provider]  rosuvastatin (CRESTOR) 10 MG tablet Take 1 tablet (10 mg total) by mouth daily. 02/02/17  Yes Wardell Honour, MD  sitaGLIPtin (JANUVIA) 100 MG tablet Take 1 tablet (100 mg total) by mouth daily. 08/10/17  Yes Wardell Honour, MD  sodium chloride (OCEAN) 0.65 % SOLN nasal spray Place 1 spray into both nostrils as needed (dryness).   Yes [provider]  SUMAtriptan (IMITREX) 50 MG tablet TAKE 1 TABLET BY MOUTH EVERY 2 HOURS AS NEEDED FOR MIGRAINE 03/05/17  Yes Wardell Honour, MD  SUPER B COMPLEX/C PO Take 1 tablet by mouth daily.   Yes [provider]  traMADol Veatrice Bourbon ER) 100 MG 24 hr tablet Take 1 tablet (100 mg total) by mouth daily. 08/18/17  Yes Tony Agreste, MD   Social History   Social History  . Marital status: Married    Spouse name: N/A  . Number of children: 0  . Years of education: Masters +   Occupational History  . Retired    Social History Main Topics  . Smoking status: Never Smoker  . Smokeless tobacco: Never Used  . Alcohol use 0.0 oz/week     Comment: seldom   . Drug use: No  . Sexual activity: Yes   Other Topics Concern  . Not on file   Social History Narrative   Marital status: married x 1969      Children: none      Lives: with wife, 1 dog      Employment: retired at age 27.  Education music      Tobacco: none      Alcohol: rare      Exercise: stationary bike in 2018; rare use in 2018 due to sick dog.      ADLs: independent with ADLs; drives      Advanced Directives:    2 cups of coffee a day, soda or tea about 3 times a week.      Review of Systems  Musculoskeletal:       Left knee pain       Objective:   Physical Exam  Musculoskeletal:  Left knee skin  intact, no effusion. Full ROM. Patella and patellar tendon, medial joint line all non tender. Located pain at lateral joint line but not tender on exam. Negative varus and valgus. Negative McMurray. Minimal tenderness along tibial  tuberosity, negative anterior and posterior drawer.     Vitals:   09/06/17 1149  BP: 110/62  Pulse: 67  Resp: 18  Temp: 98.8 F (37.1 C)  TempSrc: Oral  SpO2: 94%  Weight: 199 lb 12.8 oz (90.6 kg)  Height: 6' (1.829 m)   Dg Knee Complete 4 Views Left  Result Date: 09/06/2017 CLINICAL DATA:  Left lateral knee pain. EXAM: LEFT KNEE - COMPLETE 4+ VIEW COMPARISON:  06/03/2016 FINDINGS: Loss of joint space noted medial compartment. Trace spurring noted patellofemoral compartment. No fracture. No subluxation. No joint effusion. No worrisome lytic or sclerotic osseous abnormality. IMPRESSION: Trace spurring patellofemoral compartment.  Otherwise unremarkable. Electronically Signed   By: Misty Stanley M.D.   On: 09/06/2017 13:06       Assessment & Plan:    [name] is a 71 y.o. 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 Has Physical therapy pending. Limited on antiinflammatories at present with history of heart disease but plans on discussing short term options with cardiologist.   - continue bracing as needed on right.   - physical therapy as planned and follow up with ortho as planned.   -agreed to refill ultram ER 100mg  as only taking one per day - less total dose of tramadol. Cautioned against tylenol combo for liver.   -plans to return to dispose of 50mg  ultram.   - rtc precautions.   Addendum, 09/08/17 100 mg ER tramadol is on back order. Patient brought original prescription back to our office which I will shred. He will continue the 50 mg dose for now until 100 mg are available.  Meds ordered this encounter  Medications  . traMADol (ULTRAM ER) 100 MG 24 hr tablet    Sig: Take 1 tablet (100 mg total) by mouth daily.     Dispense:  30 tablet    Refill:  0   Patient Instructions   I refilled tramadol 100mg  extended release to use once per day. Let me know if refill needed. Keep appointment for physical therapy as planned. That may also help your left knee. It may be worth asking Dr. Einar Bond about potential short term antiinflammatory to see if that would be an option.   Return to the clinic or go to the nearest emergency room if any of your symptoms worsen or new symptoms occur.   Knee Pain, Adult Knee pain in adults is common. It can be caused by many things, including:  Arthritis.  A fluid-filled sac (cyst) or growth in your knee.  An infection in your knee.  An injury that will not heal.  Damage, swelling, or irritation of the tissues that support your knee.  Knee pain is usually not a sign of a serious problem. The pain may go away on its own with time and rest. If it does not, a health care provider may order tests to find the cause of the pain. These may include:  Imaging tests, such as an X-ray, MRI, or ultrasound.  Joint aspiration. In this test, fluid is  removed from the knee.  Arthroscopy. In this test, a lighted tube is inserted into knee and an image is projected onto a TV screen.  A biopsy. In this test, a sample of tissue is removed from the body and studied under a microscope.  Follow these instructions at home: Pay attention to any changes in your symptoms. Take these actions to relieve your pain. Activity  Rest your knee.  Do not do things that cause pain or make pain worse.  Avoid high-impact activities or exercises, such as running, jumping rope, or doing jumping jacks. General instructions  Take over-the-counter and prescription medicines only as told by your health care provider.  Raise (elevate) your knee above the level of your heart when you are sitting or lying down.  Sleep with a pillow under your knee.  If directed, apply ice to the knee: ? Put ice in a  plastic bag. ? Place a towel between your skin and the bag. ? Leave the ice on for 20 minutes, 2-3 times a day.  Ask your health care provider if you should wear an elastic knee support.  Lose weight if you are overweight. Extra weight can put pressure on your knee.  Do not use any products that contain nicotine or tobacco, such as cigarettes and e-cigarettes. Smoking may slow the healing of any bone and joint problems that you may have. If you need help quitting, ask your health care provider. Contact a health care provider if:  Your knee pain continues, changes, or gets worse.  You have a fever along with knee pain.  Your knee buckles or locks up.  Your knee swells, and the swelling becomes worse. Get help right away if:  Your knee feels warm to the touch.  You cannot move your knee.  You have severe pain in your knee.  You have chest pain.  You have trouble breathing. Summary  Knee pain in adults is common. It can be caused by many things, including, arthritis, infection, cysts, or injury.  Knee pain is usually not a sign of a serious problem, but if it does not go away, a health care provider may perform tests to know the cause of the pain.  Pay attention to any changes in your symptoms. Relieve your pain with rest, medicines, light activity, and use of ice.  Get help if your pain continues or becomes very severe, or if your knee buckles or locks up, or if you have chest pain or trouble breathing. This information is not intended to replace advice given to you by your health care provider. Make sure you discuss any questions you have with your health care provider. Document Released: 08/30/2007 Document Revised: 10/23/2016 Document Reviewed: 10/23/2016 Elsevier Interactive Patient Education  2018 [employer].    IF you received an x-ray today, you will receive an invoice from [facility]. Please contact [facility] at [phone] with questions or  concerns regarding your invoice.   IF you received labwork today, you will receive an invoice from [place]. Please contact [facility] at [phone] with questions or concerns regarding your invoice.   Our billing staff will not be able to assist you with questions regarding bills from these companies.  You will be contacted with the lab results as soon as they are available. The fastest way to get your results is to activate your My Chart account. Instructions are located on the last page of this paperwork. If you have not heard from [place] regarding the results in  2 weeks, please contact this office.      I personally performed the services described in this documentation, which was scribed in my presence. The recorded information has been reviewed and considered for accuracy and completeness, addended by me as needed, and agree with information above.  Signed,   [name], MD Primary Care at [facility].  09/06/17 2:33 PM

## 2017-09-06 NOTE — Patient Instructions (Addendum)
I refilled tramadol 100mg  extended release to use once per day. Let me know if refill needed. Keep appointment for physical therapy as planned. That may also help your left knee. It may be worth asking Dr. Einar Gip about potential short term antiinflammatory to see if that would be an option.   Return to the clinic or go to the nearest emergency room if any of your symptoms worsen or new symptoms occur.   Knee Pain, Adult Knee pain in adults is common. It can be caused by many things, including:  Arthritis.  A fluid-filled sac (cyst) or growth in your knee.  An infection in your knee.  An injury that will not heal.  Damage, swelling, or irritation of the tissues that support your knee.  Knee pain is usually not a sign of a serious problem. The pain may go away on its own with time and rest. If it does not, a health care provider may order tests to find the cause of the pain. These may include:  Imaging tests, such as an X-ray, MRI, or ultrasound.  Joint aspiration. In this test, fluid is removed from the knee.  Arthroscopy. In this test, a lighted tube is inserted into knee and an image is projected onto a TV screen.  A biopsy. In this test, a sample of tissue is removed from the body and studied under a microscope.  Follow these instructions at home: Pay attention to any changes in your symptoms. Take these actions to relieve your pain. Activity  Rest your knee.  Do not do things that cause pain or make pain worse.  Avoid high-impact activities or exercises, such as running, jumping rope, or doing jumping jacks. General instructions  Take over-the-counter and prescription medicines only as told by your health care provider.  Raise (elevate) your knee above the level of your heart when you are sitting or lying down.  Sleep with a pillow under your knee.  If directed, apply ice to the knee: ? Put ice in a plastic bag. ? Place a towel between your skin and the bag. ? Leave  the ice on for 20 minutes, 2-3 times a day.  Ask your health care provider if you should wear an elastic knee support.  Lose weight if you are overweight. Extra weight can put pressure on your knee.  Do not use any products that contain nicotine or tobacco, such as cigarettes and e-cigarettes. Smoking may slow the healing of any bone and joint problems that you may have. If you need help quitting, ask your health care provider. Contact a health care provider if:  Your knee pain continues, changes, or gets worse.  You have a fever along with knee pain.  Your knee buckles or locks up.  Your knee swells, and the swelling becomes worse. Get help right away if:  Your knee feels warm to the touch.  You cannot move your knee.  You have severe pain in your knee.  You have chest pain.  You have trouble breathing. Summary  Knee pain in adults is common. It can be caused by many things, including, arthritis, infection, cysts, or injury.  Knee pain is usually not a sign of a serious problem, but if it does not go away, a health care provider may perform tests to know the cause of the pain.  Pay attention to any changes in your symptoms. Relieve your pain with rest, medicines, light activity, and use of ice.  Get help if your pain continues  or becomes very severe, or if your knee buckles or locks up, or if you have chest pain or trouble breathing. This information is not intended to replace advice given to you by your health care provider. Make sure you discuss any questions you have with your health care provider. Document Released: 08/30/2007 Document Revised: 10/23/2016 Document Reviewed: 10/23/2016 Elsevier Interactive Patient Education  2018 [employer].    IF you received an x-ray today, you will receive an invoice from [facility]. Please contact [facility] at [phone] with questions or concerns regarding your invoice.   IF you received labwork today,  you will receive an invoice from [place]. Please contact [facility] at [phone] with questions or concerns regarding your invoice.   Our billing staff will not be able to assist you with questions regarding bills from these companies.  You will be contacted with the lab results as soon as they are available. The fastest way to get your results is to activate your My Chart account. Instructions are located on the last page of this paperwork. If you have not heard from [place] regarding the results in 2 weeks, please contact this office.

## 2017-09-07 DIAGNOSIS — M961 Postlaminectomy syndrome, not elsewhere classified: Secondary | ICD-10-CM | POA: Diagnosis not present

## 2017-09-07 NOTE — Progress Notes (Signed)
PT eval addendum- added G-codes    04-13-2017 1538  PT G-Codes **NOT FOR INPATIENT CLASS**  Functional Assessment Tool Used Clinical judgement  Functional Limitation Mobility: Walking and moving around  Mobility: Walking and Moving Around Current Status (Z6109) CJ  Mobility: Walking and Moving Around Goal Status (U0454) CI    Wray Kearns, PT, DPT [phone]

## 2017-09-08 NOTE — Progress Notes (Addendum)
Late entry for missed G-code for OT evaluation 2017/04/15.    15-Apr-2017 0900  OT G-codes **NOT FOR INPATIENT CLASS**  Functional Assessment Tool Used Clinical judgement  Functional Limitation Self care  Self Care Current Status (Z8022) CJ  Self Care Goal Status (H7981) CI   Thank you.  Norman Herrlich, MS OTR/L  Pager: [phone]

## 2017-09-09 ENCOUNTER — Telehealth: Payer: Self-pay

## 2017-09-09 NOTE — Telephone Encounter (Addendum)
I appreciate the info on other pharmacies. It may still be an issue if the med is on backorder from the pharmacy.  If another one has the Ultram 100mg  ER in stock, I can order it again. I will send him a MyCchart message with this info.

## 2017-09-09 NOTE — Telephone Encounter (Signed)
Responded to pt MYChart message RE: other pharmacies to get Tramadol. Forwarded to Dr. Carlota Raspberry to order as pt requests.

## 2017-09-10 DIAGNOSIS — S63657D Sprain of metacarpophalangeal joint of left little finger, subsequent encounter: Secondary | ICD-10-CM | POA: Diagnosis not present

## 2017-09-13 DIAGNOSIS — M1710 Unilateral primary osteoarthritis, unspecified knee: Secondary | ICD-10-CM | POA: Diagnosis not present

## 2017-09-15 DIAGNOSIS — M170 Bilateral primary osteoarthritis of knee: Secondary | ICD-10-CM | POA: Diagnosis not present

## 2017-09-20 DIAGNOSIS — M170 Bilateral primary osteoarthritis of knee: Secondary | ICD-10-CM | POA: Diagnosis not present

## 2017-09-22 DIAGNOSIS — M170 Bilateral primary osteoarthritis of knee: Secondary | ICD-10-CM | POA: Diagnosis not present

## 2017-10-20 DIAGNOSIS — M1710 Unilateral primary osteoarthritis, unspecified knee: Secondary | ICD-10-CM | POA: Diagnosis not present

## 2017-10-27 DIAGNOSIS — M47817 Spondylosis without myelopathy or radiculopathy, lumbosacral region: Secondary | ICD-10-CM | POA: Diagnosis not present

## 2017-10-27 DIAGNOSIS — M961 Postlaminectomy syndrome, not elsewhere classified: Secondary | ICD-10-CM | POA: Diagnosis not present

## 2017-11-03 DIAGNOSIS — H2512 Age-related nuclear cataract, left eye: Secondary | ICD-10-CM | POA: Diagnosis not present

## 2017-11-03 DIAGNOSIS — H401123 Primary open-angle glaucoma, left eye, severe stage: Secondary | ICD-10-CM | POA: Diagnosis not present

## 2017-11-03 DIAGNOSIS — H401112 Primary open-angle glaucoma, right eye, moderate stage: Secondary | ICD-10-CM | POA: Diagnosis not present

## 2017-11-03 DIAGNOSIS — E119 Type 2 diabetes mellitus without complications: Secondary | ICD-10-CM | POA: Diagnosis not present

## 2017-11-03 DIAGNOSIS — Z961 Presence of intraocular lens: Secondary | ICD-10-CM | POA: Diagnosis not present

## 2017-11-03 LAB — HM DIABETES EYE EXAM

## 2017-11-14 ENCOUNTER — Other Ambulatory Visit: Payer: Self-pay | Admitting: Family Medicine

## 2017-11-18 ENCOUNTER — Encounter: Payer: Self-pay | Admitting: Family Medicine

## 2017-11-18 DIAGNOSIS — H918X3 Other specified hearing loss, bilateral: Secondary | ICD-10-CM

## 2017-11-23 DIAGNOSIS — M47817 Spondylosis without myelopathy or radiculopathy, lumbosacral region: Secondary | ICD-10-CM | POA: Diagnosis not present

## 2017-11-26 ENCOUNTER — Other Ambulatory Visit: Payer: Self-pay | Admitting: Family Medicine

## 2017-11-29 ENCOUNTER — Telehealth: Payer: Self-pay | Admitting: Family Medicine

## 2017-11-29 NOTE — Telephone Encounter (Signed)
[facility] faxed back and stated that the referral needs to say "Hearing Evaluation" for insurance purposes.[name] He is scheduled to see them 12/07/2017  thanks

## 2017-11-29 NOTE — Telephone Encounter (Signed)
Referrals --- is this something I do or you can do?  Where do we put hearing evaluation?

## 2017-12-01 NOTE — Telephone Encounter (Signed)
I took care of it, I called and received clarification on what they wanted.. Thank you!

## 2017-12-05 ENCOUNTER — Other Ambulatory Visit: Payer: Self-pay | Admitting: Family Medicine

## 2017-12-07 ENCOUNTER — Encounter: Payer: Self-pay | Admitting: Family Medicine

## 2017-12-07 DIAGNOSIS — H903 Sensorineural hearing loss, bilateral: Secondary | ICD-10-CM | POA: Diagnosis not present

## 2017-12-08 DIAGNOSIS — M961 Postlaminectomy syndrome, not elsewhere classified: Secondary | ICD-10-CM | POA: Diagnosis not present

## 2017-12-08 DIAGNOSIS — M47817 Spondylosis without myelopathy or radiculopathy, lumbosacral region: Secondary | ICD-10-CM | POA: Diagnosis not present

## 2017-12-28 ENCOUNTER — Other Ambulatory Visit: Payer: Self-pay | Admitting: Family Medicine

## 2017-12-29 NOTE — Telephone Encounter (Signed)
Freestyle Lancets refill Last OV: Unable to find where OV addresses CBG monitoring Last Refill:11/15/17  #100 no refill Pharmacy:[facility] [phone]

## 2018-01-07 ENCOUNTER — Other Ambulatory Visit: Payer: Self-pay | Admitting: Anesthesiology

## 2018-01-07 DIAGNOSIS — M47817 Spondylosis without myelopathy or radiculopathy, lumbosacral region: Secondary | ICD-10-CM

## 2018-01-10 DIAGNOSIS — I517 Cardiomegaly: Secondary | ICD-10-CM | POA: Diagnosis not present

## 2018-01-10 DIAGNOSIS — I2510 Atherosclerotic heart disease of native coronary artery without angina pectoris: Secondary | ICD-10-CM | POA: Diagnosis not present

## 2018-01-10 DIAGNOSIS — E119 Type 2 diabetes mellitus without complications: Secondary | ICD-10-CM | POA: Diagnosis not present

## 2018-01-10 DIAGNOSIS — I10 Essential (primary) hypertension: Secondary | ICD-10-CM | POA: Diagnosis not present

## 2018-01-11 DIAGNOSIS — I517 Cardiomegaly: Secondary | ICD-10-CM | POA: Diagnosis not present

## 2018-01-11 DIAGNOSIS — R0602 Shortness of breath: Secondary | ICD-10-CM | POA: Diagnosis not present

## 2018-01-28 ENCOUNTER — Ambulatory Visit
Admission: RE | Admit: 2018-01-28 | Discharge: 2018-01-28 | Disposition: A | Discharge status: Home / Self Care | Payer: Medicare Other | Source: Ambulatory Visit | Attending: Anesthesiology | Admitting: Anesthesiology

## 2018-01-28 DIAGNOSIS — M4807 Spinal stenosis, lumbosacral region: Secondary | ICD-10-CM | POA: Diagnosis not present

## 2018-01-28 DIAGNOSIS — M48061 Spinal stenosis, lumbar region without neurogenic claudication: Secondary | ICD-10-CM | POA: Diagnosis not present

## 2018-01-28 DIAGNOSIS — M47817 Spondylosis without myelopathy or radiculopathy, lumbosacral region: Secondary | ICD-10-CM

## 2018-01-28 MED ORDER — IOPAMIDOL (ISOVUE-M 200) INJECTION 41%
15.0000 mL | Freq: Once | INTRAMUSCULAR | Status: AC
  Administered 2018-01-28: 15 mL via INTRATHECAL

## 2018-01-28 MED ORDER — DIAZEPAM 5 MG PO TABS
5.0000 mg | ORAL_TABLET | Freq: Once | ORAL | Status: AC
  Administered 2018-01-28: 5 mg via ORAL

## 2018-01-28 NOTE — Discharge Instructions (Signed)

## 2018-02-01 DIAGNOSIS — M961 Postlaminectomy syndrome, not elsewhere classified: Secondary | ICD-10-CM | POA: Diagnosis not present

## 2018-02-01 DIAGNOSIS — M47817 Spondylosis without myelopathy or radiculopathy, lumbosacral region: Secondary | ICD-10-CM | POA: Diagnosis not present

## 2018-02-03 ENCOUNTER — Telehealth: Payer: Self-pay | Admitting: Family Medicine

## 2018-02-03 NOTE — Telephone Encounter (Signed)
Per 08/10/2017 visit with Dr. Tamala Julian, patient is to return for recheck on diabetes, hypertension. Labs pended for provider approval.

## 2018-02-03 NOTE — Telephone Encounter (Signed)
Copied from [place]. Topic: Quick Communication - See Telephone Encounter >> Feb 03, 2018 11:54 AM Bea Graff, NT wrote: CRM for notification. See Telephone encounter for: 02/03/18. Pt would like orders for labs put in so that he can come in before his appt on Monday to have those drawn.

## 2018-02-07 ENCOUNTER — Ambulatory Visit (INDEPENDENT_AMBULATORY_CARE_PROVIDER_SITE_OTHER): Payer: Medicare Other | Admitting: Family Medicine

## 2018-02-07 ENCOUNTER — Other Ambulatory Visit: Payer: Self-pay

## 2018-02-07 ENCOUNTER — Encounter: Payer: Self-pay | Admitting: Family Medicine

## 2018-02-07 VITALS — BP 146/70 | HR 85 | Temp 97.8°F | Ht 71.26 in | Wt 203.4 lb

## 2018-02-07 DIAGNOSIS — G4733 Obstructive sleep apnea (adult) (pediatric): Secondary | ICD-10-CM | POA: Diagnosis not present

## 2018-02-07 DIAGNOSIS — E119 Type 2 diabetes mellitus without complications: Secondary | ICD-10-CM

## 2018-02-07 DIAGNOSIS — E663 Overweight: Secondary | ICD-10-CM

## 2018-02-07 DIAGNOSIS — I10 Essential (primary) hypertension: Secondary | ICD-10-CM

## 2018-02-07 DIAGNOSIS — M5136 Other intervertebral disc degeneration, lumbar region: Secondary | ICD-10-CM | POA: Diagnosis not present

## 2018-02-07 DIAGNOSIS — Z981 Arthrodesis status: Secondary | ICD-10-CM

## 2018-02-07 DIAGNOSIS — E785 Hyperlipidemia, unspecified: Secondary | ICD-10-CM | POA: Diagnosis not present

## 2018-02-07 DIAGNOSIS — I25118 Atherosclerotic heart disease of native coronary artery with other forms of angina pectoris: Secondary | ICD-10-CM | POA: Diagnosis not present

## 2018-02-07 NOTE — Patient Instructions (Signed)
     IF you received an x-ray today, you will receive an invoice from [facility]. Please contact [facility] at [phone] with questions or concerns regarding your invoice.   IF you received labwork today, you will receive an invoice from [facility]. Please contact [facility] at [phone] with questions or concerns regarding your invoice.   Our billing staff will not be able to assist you with questions regarding bills from these companies.  You will be contacted with the lab results as soon as they are available. The fastest way to get your results is to activate your My Chart account. Instructions are located on the last page of this paperwork. If you have not heard from us regarding the results in 2 weeks, please contact this office.     

## 2018-02-07 NOTE — Progress Notes (Signed)
Subjective:    Patient ID: [name], male    DOB: [birth year], 72 y.o.   MRN: [id number]  02/07/2018  Chronic Condtions (6 Month F/U)    HPI This 72 y.o. male presents for evaluation of DMII, hypercholesterolemia, hearing loss, CAD, migraine, OSA, DDD lumbar spine s/p lumbar fusion.  Management changes made to previous visit include the following  -anticipatory guidance provided --- exercise, weight loss, safe driving practices, aspirin 81mg  daily. -obtain age appropriate screening labs and labs for chronic disease management. -moderate fall risk due to DDD lumbar spine; no evidence of depression; no evidence of hearing loss.  Discussed advanced directives and living will; also discussed end of life issues including code status.  -new onset cerumen impaction; s/p ear irrigation during visit of LEFT ear. -recovering from recent NS procedure; suffering with significant lower back pain despite nerve stimulator.  Negatively affecting quality of life yet no depression at this itme  Update since last visit in September 2018: S/p DDD lumbar spine surgery last May 2018.  Physical therapy for lower back irritated osteoarthritis of knee.  Now knee pain is very disruptive.  Last time wife was here, appointment with Carlota Raspberry.  To have blood work completed; PA recommended that pt have labs completed.  Does not want knee replacement; no pain to walk on it.  Generally does not hurt to sit as long as chair has lumbar support.  Movement helps with knee pain.  When it does hurt, can apply TENS unit.  3-6am is the problem.  Dr. Carlota Raspberry would only prescribe 50mg  of Tramadol every so often which causes constipation or sedation. If takes ER Tramadol, helps get through the evening.  Will only prescribe the name brand drug.  [facility] advised that name brand drug not being manufactured for a while. Referred to [facility]; only prescribed short acting; Dr. Maryjean Ka at spine specialist; ordered time released  Tramadol and they cannot get it again.  Taking Tylenol ES extended release.    Also had oral surgery on Friday; taking Doxycycline for that.  When had had DDD lumbar spine, blood pressure started decreasing.  Taking Amldodipine 10mg  1/2 daily.  Home BP running not sure. Home sugars running really good after surgery in 120s; now in 140s.  Taking Jardiance and Januvia. Short-term memory is shot.   Got a new dog after the holidays; forgetting name. Proximal toes numb for two years.  Tart cherry juice well. No recent gout flares.  Adding pectin also.    BP Readings from Last 3 Encounters:  03/26/18 118/70  03/09/18 122/76  02/07/18 (!) 146/70   Wt Readings from Last 3 Encounters:  03/26/18 204 lb (92.5 kg)  03/09/18 202 lb 3.2 oz (91.7 kg)  02/07/18 203 lb 6.4 oz (92.3 kg)   Immunization History  Administered Date(s) Administered  . Influenza, Seasonal, Injecte, Preservative Fre 02/02/2013  . Influenza,inj,Quad PF,6+ Mos 09/05/2013, 11/22/2014, 07/25/2015, 08/29/2016, 08/10/2017  . Pneumococcal Conjugate-13 11/22/2014  . Pneumococcal Polysaccharide-23 06/03/2016  . Zoster 11/22/2014    Review of Systems  Constitutional: Negative for activity change, appetite change, chills, diaphoresis, fatigue and fever.  Respiratory: Negative for cough and shortness of breath.   Cardiovascular: Negative for chest pain, palpitations and leg swelling.  Gastrointestinal: Negative for abdominal pain, diarrhea, nausea and vomiting.  Endocrine: Negative for cold intolerance, heat intolerance, polydipsia, polyphagia and polyuria.  Musculoskeletal: Positive for arthralgias and back pain.  Skin: Negative for color change, rash and wound.  Neurological: Negative for dizziness, tremors,  seizures, syncope, facial asymmetry, speech difficulty, weakness, light-headedness, numbness and headaches.  Psychiatric/Behavioral: Negative for dysphoric mood and sleep disturbance. The patient is not nervous/anxious.      Past Medical History:  Diagnosis Date  . Arthritis    DDD, scoliosis, stenosis    . Atherosclerotic heart disease native coronary artery w/angina pectoris ([place])   . Calcium blood increased   . Cancer (HCC)    skin ca, posterior R ear   ([place])  . Complication of anesthesia    difficulty intubation , anterior larynx, easy mask ventilation  . Coronary artery disease   . Diabetes mellitus without complication ([place])   . Difficult intubation   . Diverticulitis   . Glaucoma   . Headache(784.0)    migraines  . History of kidney stones   . Hyperlipemia   . Hypertension   . Sleep apnea    CPAP still in use q night, & for naps thru out the day, pt. reports that last study was neg for sleep apnea but he still uses the device    . Spinal stenosis   . Ventricular dilatation    Right   Past Surgical History:  Procedure Laterality Date  . APPENDECTOMY  1991  . BOWEL RESECTION  1985  . CARDIAC CATHETERIZATION  2006  . CATARACT EXTRACTION Right   . CHOLECYSTECTOMY  1990  . COLON SURGERY     bowel resection, for diverticulitis   . COLONOSCOPY N/A 06/15/2013   Procedure: COLONOSCOPY;  Surgeon: Beryle Beams, MD;  Location: [facility];  Service: Endoscopy;  Laterality: N/A;  . CORONARY ANGIOPLASTY     STENT PLACED  . LAMINECTOMY WITH POSTERIOR LATERAL ARTHRODESIS LEVEL 1 N/A 04/09/2017   Procedure: Lumbar TwoThree,Lumbar Three-Four,Lumbar Four-Five Laminectomy with Lumbar Five-Sacral One Posterior lateral fusion;  Surgeon: Eustace Moore, MD;  Location: [facility];  Service: Neurosurgery;  Laterality: N/A;  . LITHOTRIPSY    . SPINAL CORD STIMULATOR INSERTION N/A 03/20/2016   Procedure: LUMBAR SPINAL CORD STIMULATOR INSERTION;  Surgeon: Clydell Hakim, MD;  Location: [facility];  Service: Neurosurgery;  Laterality: N/A;  . TONSILLECTOMY    . [place] ?   Allergies  Allergen Reactions  . Amaryl Other (See Comments)    Gives pt migraines  . Other Other (See  Comments)    Pistachios, lima beans, and some types of chocolate cause migraines  Nasal corticosteroids causes nose bleeds  . Skelaxin [Metaxalone] Dermatitis    Cherry red rash with sloughing off of skin at left upper chest/shoulder  . Stool Softener [Dss] Other (See Comments)    Causes migraines  . Pistachio Nut (Diagnostic) Other (See Comments)     In  Addition :Lima beans, milk chocolate   . Doc-Q-Lax [Digital Fever Thermometer] Other (See Comments)    Headache   . Levofloxacin Nausea Only  . Lisinopril Other (See Comments)    cough  . Metformin And Related Diarrhea   Current Outpatient Medications on File Prior to Visit  Medication Sig Dispense Refill  . acetaminophen (TYLENOL) 650 MG CR tablet Take 650 mg by mouth 2 (two) times daily.    Marland Kitchen aspirin 81 MG tablet Take 81 mg by mouth every evening.     . bimatoprost (LUMIGAN) 0.01 % SOLN Place 1 drop into both eyes every evening.    . brimonidine-timolol (COMBIGAN) 0.2-0.5 % ophthalmic solution Place 1 drop into both eyes 2 (two) times daily.    . brinzolamide (AZOPT) 1 %  ophthalmic suspension Place 1 drop into both eyes 2 (two) times daily.     . Cholecalciferol (VITAMIN D3) 5000 units CAPS Take 5,000 Units by mouth every Monday, Wednesday, and Friday.    . Coenzyme Q10 (COQ-10) 200 MG CAPS Take 200 mg by mouth daily.    . empagliflozin (JARDIANCE) 25 MG TABS tablet Take 25 mg by mouth daily. 90 tablet 3  . Famotidine-Ca Carb-Mag Hydrox (PEPCID COMPLETE PO) Take 1 tablet by mouth daily as needed (acid reflux).    . FREESTYLE LITE test strip USE TO TEST BLOOD SUGAR ONCE A DAY 100 each 0  . ketoconazole (NIZORAL) 2 % cream APPLY TOPICALLY TWICE DAILY 60 g 0  . metoprolol tartrate (LOPRESSOR) 25 MG tablet Take 25 mg by mouth 2 (two) times daily.    . Multiple Vitamins-Minerals (PRESERVISION AREDS 2) CAPS Take 1 capsule by mouth 2 (two) times daily.    . Omega-3 Fatty Acids (FISH OIL) 1200 MG CAPS Take 1,200 mg by mouth 2 (two) times  daily.    Marland Kitchen OVER THE COUNTER MEDICATION Take 1,000 mg by mouth daily. microlactin otc supplement    . sitaGLIPtin (JANUVIA) 100 MG tablet Take 1 tablet (100 mg total) by mouth daily. 90 tablet 3  . sodium chloride (OCEAN) 0.65 % SOLN nasal spray Place 1 spray into both nostrils as needed (dryness).    . SUMAtriptan (IMITREX) 50 MG tablet TAKE 1 TABLET BY MOUTH EVERY 2 HOURS AS NEEDED FOR MIGRAINE 9 tablet 3  . SUPER B COMPLEX/C PO Take 1 tablet by mouth daily.     No current facility-administered medications on file prior to visit.    Social History   Socioeconomic History  . Marital status: Married    Spouse name: Not on file  . Number of children: 0  . Years of education: Masters +  . Highest education level: Not on file  Occupational History  . Occupation: Retired  Scientific laboratory technician  . Financial resource strain: Not on file  . Food insecurity:    Worry: Not on file    Inability: Not on file  . Transportation needs:    Medical: Not on file    Non-medical: Not on file  Tobacco Use  . Smoking status: Never Smoker  . Smokeless tobacco: Never Used  Substance and Sexual Activity  . Alcohol use: Yes    Alcohol/week: 0.0 oz    Comment: seldom   . Drug use: No  . Sexual activity: Yes  Lifestyle  . Physical activity:    Days per week: Not on file    Minutes per session: Not on file  . Stress: Not on file  Relationships  . Social connections:    Talks on phone: Not on file    Gets together: Not on file    Attends religious service: Not on file    Active member of club or organization: Not on file    Attends meetings of clubs or organizations: Not on file    Relationship status: Not on file  . Intimate partner violence:    Fear of current or ex partner: Not on file    Emotionally abused: Not on file    Physically abused: Not on file    Forced sexual activity: Not on file  Other Topics Concern  . Not on file  Social History Narrative   Marital status: married x 1969       Children: none      Lives: with wife, 1  dog      Employment: retired at age 56.  Education music      Tobacco: none      Alcohol: rare      Exercise: stationary bike in 2018; rare use in 2018 due to sick dog.      ADLs: independent with ADLs; drives      Advanced Directives:    2 cups of coffee a day, soda or tea about 3 times a week.    Family History  Problem Relation Age of Onset  . Heart disease Father   . Heart attack Father   . Heart disease Paternal Grandmother        Objective:    BP (!) 146/70 (BP Location: Left Arm, Patient Position: Sitting, Cuff Size: Normal)   Pulse 85   Temp 97.8 F (36.6 C) (Oral)   Ht 5' 11.26" (1.81 m)   Wt 203 lb 6.4 oz (92.3 kg)   SpO2 94%   BMI 28.16 kg/m  Physical Exam  Constitutional: He is oriented to person, place, and time. He appears well-developed and well-nourished. No distress.  HENT:  Head: Normocephalic and atraumatic.  Right Ear: External ear normal.  Left Ear: External ear normal.  Nose: Nose normal.  Mouth/Throat: Oropharynx is clear and moist.  Eyes: Pupils are equal, round, and reactive to light. Conjunctivae and EOM are normal.  Neck: Normal range of motion. Neck supple. Carotid bruit is not present. No thyromegaly present.  Cardiovascular: Normal rate, regular rhythm, normal heart sounds and intact distal pulses. Exam reveals no gallop and no friction rub.  No murmur heard. Pulmonary/Chest: Effort normal and breath sounds normal. He has no wheezes. He has no rales.  Abdominal: Soft. Bowel sounds are normal. He exhibits no distension and no mass. There is no tenderness. There is no rebound and no guarding.  Lymphadenopathy:    He has no cervical adenopathy.  Neurological: He is alert and oriented to person, place, and time. No cranial nerve deficit.  Skin: Skin is warm and dry. No rash noted. He is not diaphoretic.  Psychiatric: He has a normal mood and affect. His behavior is normal.  Nursing note and vitals  reviewed.  No results found. Depression screen [facility] 2/9 03/26/2018 03/09/2018 02/07/2018 09/06/2017 08/10/2017  Decreased Interest 0 0 0 0 0  Down, Depressed, Hopeless 0 0 0 0 0  PHQ - 2 Score 0 0 0 0 0   Fall Risk  03/26/2018 03/09/2018 02/07/2018 09/06/2017 08/10/2017  Falls in the past year? No No No Yes Yes  Number falls in past yr: - - - 2 or more 2 or more  Injury with Fall? - - - No No  Risk for fall due to : - - - - -  Follow up - - - - -        Assessment & Plan:   1. Type 2 diabetes mellitus without complication, without long-term current use of insulin ([place])   2. Essential hypertension   3. Obstructive sleep apnea   4. Dyslipidemia   5. Atherosclerosis of native coronary artery of native heart with stable angina pectoris ([place])   6. Degenerative disc disease, lumbar   7. S/P lumbar spinal fusion   8. Overweight     Controlled diabetes mellitus, hypertension, obstructive sleep apnea, dyslipidemia.  Obtain labs for chronic disease management.  No changes to management at this time.  Degenerative disc disease of lumbar spine: Status post lumbar spinal fusion in 2018.  Continues to  suffer with significant lower back pain which is negatively impacting quality of life.  Patient resistant to opiate management of chronic pain.  Followed closely by spine specialist.  Overweight with BMI of 28: Recommend weight loss, exercise, low-fat and low calorie diet.  Recommend 1500 kcal/day with a goal of 60 g of protein daily.   Orders Placed This Encounter  Procedures  . Comprehensive metabolic panel    Order Specific Question:   Has the patient fasted?    Answer:   No  . Lipid panel    Order Specific Question:   Has the patient fasted?    Answer:   No  . CBC with Differential/Platelet  . Hemoglobin A1c  . Microalbumin, urine   No orders of the defined types were placed in this encounter.   Return in about 6 months (around 08/10/2018) for complete physical examiniation.   Vin Yonke  Elayne Guerin, M.D. Primary Care at [facility] [address] [place], [place]  [address] [phone] phone [phone] fax

## 2018-02-08 LAB — COMPREHENSIVE METABOLIC PANEL
ALK PHOS: 64 IU/L (ref 39–117)
ALT: 24 IU/L (ref 0–44)
AST: 19 IU/L (ref 0–40)
Albumin/Globulin Ratio: 2.1 (ref 1.2–2.2)
Albumin: 4.7 g/dL (ref 3.5–4.8)
BUN/Creatinine Ratio: 20 (ref 10–24)
BUN: 20 mg/dL (ref 8–27)
Bilirubin Total: 0.3 mg/dL (ref 0.0–1.2)
CHLORIDE: 98 mmol/L (ref 96–106)
CO2: 24 mmol/L (ref 20–29)
CREATININE: 0.98 mg/dL (ref 0.76–1.27)
Calcium: 11.1 mg/dL — ABNORMAL HIGH (ref 8.6–10.2)
GFR calc Af Amer: 89 mL/min/{1.73_m2} (ref >59)
GFR calc non Af Amer: 77 mL/min/{1.73_m2} (ref >59)
GLUCOSE: 124 mg/dL — AB (ref 65–99)
Globulin, Total: 2.2 g/dL (ref 1.5–4.5)
Potassium: 3.5 mmol/L (ref 3.5–5.2)
SODIUM: 142 mmol/L (ref 134–144)
Total Protein: 6.9 g/dL (ref 6.0–8.5)

## 2018-02-08 LAB — MICROALBUMIN, URINE: MICROALBUM., U, RANDOM: 13.5 ug/mL

## 2018-02-08 LAB — CBC WITH DIFFERENTIAL/PLATELET
BASOS: 0 %
Basophils Absolute: 0 10*3/uL (ref 0.0–0.2)
EOS (ABSOLUTE): 0.1 10*3/uL (ref 0.0–0.4)
Eos: 1 %
HEMOGLOBIN: 16.1 g/dL (ref 13.0–17.7)
Hematocrit: 46.4 % (ref 37.5–51.0)
IMMATURE GRANULOCYTES: 0 %
Immature Grans (Abs): 0 10*3/uL (ref 0.0–0.1)
Lymphocytes Absolute: 1.7 10*3/uL (ref 0.7–3.1)
Lymphs: 27 %
MCH: 33 pg (ref 26.6–33.0)
MCHC: 34.7 g/dL (ref 31.5–35.7)
MCV: 95 fL (ref 79–97)
MONOCYTES: 10 %
MONOS ABS: 0.6 10*3/uL (ref 0.1–0.9)
NEUTROS PCT: 62 %
Neutrophils Absolute: 3.9 10*3/uL (ref 1.4–7.0)
Platelets: 189 10*3/uL (ref 150–379)
RBC: 4.88 x10E6/uL (ref 4.14–5.80)
RDW: 13.5 % (ref 12.3–15.4)
WBC: 6.4 10*3/uL (ref 3.4–10.8)

## 2018-02-08 LAB — LIPID PANEL
CHOLESTEROL TOTAL: 171 mg/dL (ref 100–199)
Chol/HDL Ratio: 4.6 ratio (ref 0.0–5.0)
HDL: 37 mg/dL — ABNORMAL LOW (ref >39)
LDL CALC: 78 mg/dL (ref 0–99)
TRIGLYCERIDES: 281 mg/dL — AB (ref 0–149)
VLDL CHOLESTEROL CAL: 56 mg/dL — AB (ref 5–40)

## 2018-02-08 LAB — HEMOGLOBIN A1C
ESTIMATED AVERAGE GLUCOSE: 134 mg/dL
Hgb A1c MFr Bld: 6.3 % — ABNORMAL HIGH (ref 4.8–5.6)

## 2018-02-08 NOTE — Telephone Encounter (Signed)
Orders complete at visit on 02/07/18.

## 2018-02-14 ENCOUNTER — Other Ambulatory Visit: Payer: Self-pay | Admitting: Family Medicine

## 2018-02-14 DIAGNOSIS — E785 Hyperlipidemia, unspecified: Secondary | ICD-10-CM

## 2018-02-17 DIAGNOSIS — H2511 Age-related nuclear cataract, right eye: Secondary | ICD-10-CM | POA: Diagnosis not present

## 2018-02-17 DIAGNOSIS — Z961 Presence of intraocular lens: Secondary | ICD-10-CM | POA: Diagnosis not present

## 2018-02-17 DIAGNOSIS — H401133 Primary open-angle glaucoma, bilateral, severe stage: Secondary | ICD-10-CM | POA: Diagnosis not present

## 2018-03-04 ENCOUNTER — Encounter: Payer: Self-pay | Admitting: Family Medicine

## 2018-03-04 ENCOUNTER — Other Ambulatory Visit: Payer: Self-pay | Admitting: Family Medicine

## 2018-03-09 ENCOUNTER — Ambulatory Visit: Payer: Medicare Other | Admitting: Emergency Medicine

## 2018-03-09 ENCOUNTER — Other Ambulatory Visit: Payer: Self-pay

## 2018-03-09 ENCOUNTER — Ambulatory Visit (INDEPENDENT_AMBULATORY_CARE_PROVIDER_SITE_OTHER): Payer: Medicare Other | Admitting: Physician Assistant

## 2018-03-09 ENCOUNTER — Encounter: Payer: Self-pay | Admitting: Physician Assistant

## 2018-03-09 VITALS — BP 122/76 | HR 75 | Temp 99.0°F | Resp 18 | Ht 71.26 in | Wt 202.2 lb

## 2018-03-09 DIAGNOSIS — L723 Sebaceous cyst: Secondary | ICD-10-CM

## 2018-03-09 DIAGNOSIS — L089 Local infection of the skin and subcutaneous tissue, unspecified: Secondary | ICD-10-CM | POA: Diagnosis not present

## 2018-03-09 DIAGNOSIS — L02215 Cutaneous abscess of perineum: Secondary | ICD-10-CM | POA: Diagnosis not present

## 2018-03-09 DIAGNOSIS — I25118 Atherosclerotic heart disease of native coronary artery with other forms of angina pectoris: Secondary | ICD-10-CM | POA: Diagnosis not present

## 2018-03-09 MED ORDER — MUPIROCIN 2 % EX OINT: 1.0000 "application " | TOPICAL_OINTMENT | Freq: Two times a day (BID) | CUTANEOUS | 0 refills | Status: DC

## 2018-03-09 MED ORDER — CEPHALEXIN 500 MG PO CAPS: 500.0000 mg | ORAL_CAPSULE | Freq: Four times a day (QID) | ORAL | 0 refills | Status: AC

## 2018-03-09 NOTE — Progress Notes (Signed)
Patient ID: [name], male    DOB: November 20, 1945, 72 y.o.   MRN: [id number]  PCP: Wardell Honour, MD  Chief Complaint  Patient presents with  . Cyst    x2 days, pt states cyst is beside beside his anus. Pt states he has not noticed any bleeding our discharge yet.    Subjective:   Presents for evaluation of infected cyst near the anus. Reports developing these every couple of years, requiring I&D.  He noticed pain and swelling yesterday. No drainage. No trauma or injury recalled. No fever or chills.  Applied leftover mupirocin ointment last night and this morning.     Review of Systems As above.    Patient Active Problem List   Diagnosis Date Noted  . S/P lumbar spinal fusion 04/09/2017  . Type 2 diabetes mellitus without complication, without long-term current use of insulin ([place]) 02/14/2016  . Degenerative disc disease, lumbar 02/14/2016  . HTN (hypertension) 02/22/2012  . Dyslipidemia 02/22/2012  . Obstructive sleep apnea 10/14/2010  . Migraine headache 10/14/2010  . GLAUCOMA 10/14/2010  . Coronary atherosclerosis 10/14/2010  . DIVERTICULAR DISEASE 10/14/2010     Prior to Admission medications   Medication Sig Start Date End Date Taking? Authorizing Provider  acetaminophen (TYLENOL) 650 MG CR tablet Take 650 mg by mouth 2 (two) times daily.   Yes [provider]  amLODipine (NORVASC) 10 MG tablet TAKE 1 TABLET BY MOUTH DAILY Patient taking differently: TAKE 1/4 TABLET BY MOUTH DAILY 05/04/17  Yes Wardell Honour, MD  bimatoprost (LUMIGAN) 0.01 % SOLN Place 1 drop into both eyes every evening.   Yes [provider]  brimonidine-timolol (COMBIGAN) 0.2-0.5 % ophthalmic solution Place 1 drop into both eyes 2 (two) times daily.   Yes [provider]  brinzolamide (AZOPT) 1 % ophthalmic suspension Place 1 drop into both eyes 2 (two) times daily.    Yes [provider]  chlorthalidone (HYGROTON) 25 MG tablet TAKE 1  TABLET(25 MG) BY MOUTH DAILY 03/04/18  Yes Wardell Honour, MD  Cholecalciferol (VITAMIN D3) 5000 units CAPS Take 5,000 Units by mouth every Monday, Wednesday, and Friday.   Yes [provider]  Coenzyme Q10 (COQ-10) 200 MG CAPS Take 200 mg by mouth daily.   Yes [provider]  empagliflozin (JARDIANCE) 25 MG TABS tablet Take 25 mg by mouth daily. 08/10/17  Yes Wardell Honour, MD  Famotidine-Ca Carb-Mag Hydrox [facility] COMPLETE PO) Take 1 tablet by mouth daily as needed (acid reflux).   Yes [provider]  FREESTYLE LITE test strip USE TO TEST BLOOD SUGAR ONCE A DAY 11/15/17  Yes Wardell Honour, MD  ketoconazole (NIZORAL) 2 % cream APPLY TOPICALLY TWICE DAILY 11/26/17  Yes Wardell Honour, MD  Lancets (FREESTYLE) lancets USE TO TEST BLOOD SUGAR ONCE DAILY 12/29/17  Yes Wardell Honour, MD  metoprolol tartrate (LOPRESSOR) 25 MG tablet Take 25 mg by mouth 2 (two) times daily.   Yes [provider]  Multiple Vitamins-Minerals (PRESERVISION AREDS 2) CAPS Take 1 capsule by mouth 2 (two) times daily.   Yes [provider]  mupirocin ointment (BACTROBAN) 2 % Apply 1 application topically 2 (two) times daily. Patient taking differently: Apply 1 application topically daily as needed (skin infections).  03/18/17  Yes Weber, Sarah L, PA-C  Omega-3 Fatty Acids (FISH OIL) 1200 MG CAPS Take 1,200 mg by mouth 2 (two) times daily.   Yes [provider]  OVER THE COUNTER  MEDICATION Take 1,000 mg by mouth daily. microlactin otc supplement   Yes [provider]  rosuvastatin (CRESTOR) 10 MG tablet TAKE 1 TABLET BY MOUTH EVERY DAY 02/14/18  Yes Wardell Honour, MD  sitaGLIPtin (JANUVIA) 100 MG tablet Take 1 tablet (100 mg total) by mouth daily. 08/10/17  Yes Wardell Honour, MD  sodium chloride (OCEAN) 0.65 % SOLN nasal spray Place 1 spray into both nostrils as needed (dryness).   Yes [provider]  SUMAtriptan (IMITREX) 50 MG tablet TAKE 1 TABLET BY  MOUTH EVERY 2 HOURS AS NEEDED FOR MIGRAINE 03/05/17  Yes Wardell Honour, MD  SUPER B COMPLEX/C PO Take 1 tablet by mouth daily.   Yes [provider]  aspirin 81 MG tablet Take 81 mg by mouth every evening.     [provider]  doxycycline (VIBRA-TABS) 100 MG tablet  02/03/18   [provider]     Allergies  Allergen Reactions  . Amaryl Other (See Comments)    Gives pt migraines  . Other Other (See Comments)    Pistachios, lima beans, and some types of chocolate cause migraines  Nasal corticosteroids causes nose bleeds  . Skelaxin [Metaxalone] Dermatitis    Cherry red rash with sloughing off of skin at left upper chest/shoulder  . Stool Softener [Dss] Other (See Comments)    Causes migraines  . Pistachio Nut (Diagnostic) Other (See Comments)     In  Addition :Lima beans, milk chocolate   . Doc-Q-Lax [Digital Fever Thermometer] Other (See Comments)    Headache   . Levofloxacin Nausea Only  . Lisinopril Other (See Comments)    cough  . Metformin And Related Diarrhea       Objective:  Physical Exam  Constitutional: He is oriented to person, place, and time. He appears well-developed and well-nourished. He is active and cooperative. No distress.  BP 122/76 (BP Location: Left Arm, Patient Position: Sitting, Cuff Size: Large)   Pulse 75   Temp 99 F (37.2 C) (Oral)   Resp 18   Ht 5' 11.26" (1.81 m)   Wt 202 lb 3.2 oz (91.7 kg)   SpO2 97%   BMI 28.00 kg/m    Eyes: Conjunctivae are normal.  Pulmonary/Chest: Effort normal.  Genitourinary:     Neurological: He is alert and oriented to person, place, and time.  Psychiatric: He has a normal mood and affect. His speech is normal and behavior is normal.    Verbal Consent Obtained. 11 blade used to incise the lesion centrally.   Purulence and intact 2-3 mm sebaceous cyst expressed.  Cleansed and dressed.     Assessment & Plan:   1. Infected sebaceous cyst of skin 2. Perineal abscess,  superficial Definitive treatment with I&D, described above.  Keflex.  Await wound culture.  Local wound care.  Anticipatory guidance provided. - WOUND CULTURE - cephALEXin (KEFLEX) 500 MG capsule; Take 1 capsule (500 mg total) by mouth 4 (four) times daily for 5 days.  Dispense: 20 capsule; Refill: 0 - mupirocin ointment (BACTROBAN) 2 %; Apply 1 application topically 2 (two) times daily.  Dispense: 22 g; Refill: 0    Return if symptoms worsen or fail to improve.   Fara Chute, PA-C Primary Care at [place]

## 2018-03-09 NOTE — Progress Notes (Signed)
Subjective:    Patient ID: [name], male    DOB: October 08, 1946, 72 y.o.   MRN: [id number] Chief Complaint  Patient presents with  . Cyst    x2 days, pt states cyst is beside beside his anus. Pt states he has not noticed any bleeding our discharge yet.    HPI  72 yo male presents for evaluation of a cyst. He reports a similar cyst that occurs every 2 years, most occur within his GU area. He began noticing this cyst yesterday. Denies any prodromal soreness or pain. Denies any sebum or blood with bowel movements or when checking. He has bandaged it with 2x4 dressings without any drainage.   He has tried some out of date mupirocin (last night and this AM).  Denies fevers, chills  Review of Systems  As above.  Patient Active Problem List   Diagnosis Date Noted  . S/P lumbar spinal fusion 04/09/2017  . Type 2 diabetes mellitus without complication, without long-term current use of insulin ([place]) 02/14/2016  . Degenerative disc disease, lumbar 02/14/2016  . HTN (hypertension) 02/22/2012  . Dyslipidemia 02/22/2012  . Obstructive sleep apnea 10/14/2010  . Migraine headache 10/14/2010  . GLAUCOMA 10/14/2010  . Coronary atherosclerosis 10/14/2010  . DIVERTICULAR DISEASE 10/14/2010    Past Medical History:  Diagnosis Date  . Arthritis    DDD, scoliosis, stenosis    . Atherosclerotic heart disease native coronary artery w/angina pectoris ([place])   . Calcium blood increased   . Cancer (HCC)    skin ca, posterior R ear   (BENIGN)  . Complication of anesthesia    difficulty intubation , anterior larynx, easy mask ventilation  . Coronary artery disease   . Diabetes mellitus without complication ([place])   . Difficult intubation   . Diverticulitis   . Glaucoma   . Headache(784.0)    migraines  . History of kidney stones   . Hyperlipemia   . Hypertension   . Sleep apnea    CPAP still in use q night, & for naps thru out the day, pt. reports that last study was neg for sleep  apnea but he still uses the device    . Spinal stenosis   . Ventricular dilatation    Right    Prior to Admission medications   Medication Sig Start Date End Date Taking? Authorizing Provider  acetaminophen (TYLENOL) 650 MG CR tablet Take 650 mg by mouth 2 (two) times daily.   Yes [provider]  amLODipine (NORVASC) 10 MG tablet TAKE 1 TABLET BY MOUTH DAILY Patient taking differently: TAKE 1/4 TABLET BY MOUTH DAILY 05/04/17  Yes Wardell Honour, MD  bimatoprost (LUMIGAN) 0.01 % SOLN Place 1 drop into both eyes every evening.   Yes [provider]  brimonidine-timolol (COMBIGAN) 0.2-0.5 % ophthalmic solution Place 1 drop into both eyes 2 (two) times daily.   Yes [provider]  brinzolamide (AZOPT) 1 % ophthalmic suspension Place 1 drop into both eyes 2 (two) times daily.    Yes [provider]  chlorthalidone (HYGROTON) 25 MG tablet TAKE 1 TABLET(25 MG) BY MOUTH DAILY 03/04/18  Yes Wardell Honour, MD  Cholecalciferol (VITAMIN D3) 5000 units CAPS Take 5,000 Units by mouth every Monday, Wednesday, and Friday.   Yes [provider]  Coenzyme Q10 (COQ-10) 200 MG CAPS Take 200 mg by mouth daily.   Yes [provider]  empagliflozin (JARDIANCE) 25 MG TABS tablet Take 25 mg by mouth daily. 08/10/17  Yes Wardell Honour, MD  Famotidine-Ca Carb-Mag Hydrox [facility] COMPLETE PO) Take 1 tablet by mouth daily as needed (acid reflux).   Yes [provider]  FREESTYLE LITE test strip USE TO TEST BLOOD SUGAR ONCE A DAY 11/15/17  Yes Wardell Honour, MD  ketoconazole (NIZORAL) 2 % cream APPLY TOPICALLY TWICE DAILY 11/26/17  Yes Wardell Honour, MD  Lancets (FREESTYLE) lancets USE TO TEST BLOOD SUGAR ONCE DAILY 12/29/17  Yes Wardell Honour, MD  metoprolol tartrate (LOPRESSOR) 25 MG tablet Take 25 mg by mouth 2 (two) times daily.   Yes [provider]  Multiple Vitamins-Minerals (PRESERVISION AREDS 2) CAPS Take 1 capsule by mouth 2 (two)  times daily.   Yes [provider]  mupirocin ointment (BACTROBAN) 2 % Apply 1 application topically 2 (two) times daily. Patient taking differently: Apply 1 application topically daily as needed (skin infections).  03/18/17  Yes Weber, Sarah L, PA-C  Omega-3 Fatty Acids (FISH OIL) 1200 MG CAPS Take 1,200 mg by mouth 2 (two) times daily.   Yes [provider]  OVER THE COUNTER MEDICATION Take 1,000 mg by mouth daily. microlactin otc supplement   Yes [provider]  rosuvastatin (CRESTOR) 10 MG tablet TAKE 1 TABLET BY MOUTH EVERY DAY 02/14/18  Yes Wardell Honour, MD  sitaGLIPtin (JANUVIA) 100 MG tablet Take 1 tablet (100 mg total) by mouth daily. 08/10/17  Yes Wardell Honour, MD  sodium chloride (OCEAN) 0.65 % SOLN nasal spray Place 1 spray into both nostrils as needed (dryness).   Yes [provider]  SUMAtriptan (IMITREX) 50 MG tablet TAKE 1 TABLET BY MOUTH EVERY 2 HOURS AS NEEDED FOR MIGRAINE 03/05/17  Yes Wardell Honour, MD  SUPER B COMPLEX/C PO Take 1 tablet by mouth daily.   Yes [provider]  aspirin 81 MG tablet Take 81 mg by mouth every evening.     [provider]  doxycycline (VIBRA-TABS) 100 MG tablet  02/03/18   [provider]    Allergies  Allergen Reactions  . Amaryl Other (See Comments)    Gives pt migraines  . Other Other (See Comments)    Pistachios, lima beans, and some types of chocolate cause migraines  Nasal corticosteroids causes nose bleeds  . Skelaxin [Metaxalone] Dermatitis    Cherry red rash with sloughing off of skin at left upper chest/shoulder  . Stool Softener [Dss] Other (See Comments)    Causes migraines  . Pistachio Nut (Diagnostic) Other (See Comments)     In  Addition :Lima beans, milk chocolate   . Doc-Q-Lax [Digital Fever Thermometer] Other (See Comments)    Headache   . Levofloxacin Nausea Only  . Lisinopril Other (See Comments)    cough  . Metformin And Related Diarrhea        Objective:   Physical Exam  Constitutional: He is oriented to person, place, and time. He appears well-developed and well-nourished.  BP 122/76 (BP Location: Left Arm, Patient Position: Sitting, Cuff Size: Large)   Pulse 75   Temp 99 F (37.2 C) (Oral)   Resp 18   Ht 5' 11.26" (1.81 m)   Wt 202 lb 3.2 oz (91.7 kg)   SpO2 97%   BMI 28.00 kg/m    HENT:  Head: Normocephalic and atraumatic.  Neck: Normal range of motion.  Cardiovascular: Normal rate, regular rhythm, normal heart sounds and intact distal pulses. Exam reveals no gallop and no friction rub.  No murmur heard. Pulmonary/Chest:  Effort normal and breath sounds normal.  Genitourinary:     Neurological: He is alert and oriented to person, place, and time.  Skin: Skin is warm and dry.  Psychiatric: He has a normal mood and affect. His behavior is normal.     Procedure: 0.5 cm Perianal sebaceous abscess was drained via small incision with scalpel. Purulent drainage was collected and sent for culture. Wound was covered with non-adhering gauze. No complications.       Assessment & Plan:   2. Perineal abscess, superficial Await culture and adjust antibiotic as needed. Recently prescribed doxycycline for dental procedure, possibly allergic to sulfa drugs. - WOUND CULTURE - cephALEXin (KEFLEX) 500 MG capsule; Take 1 capsule (500 mg total) by mouth 4 (four) times daily for 5 days.  Dispense: 20 capsule; Refill: 0 - mupirocin ointment (BACTROBAN) 2 %; Apply 1 application topically 2 (two) times daily.  Dispense: 22 g; Refill: 0

## 2018-03-09 NOTE — Patient Instructions (Signed)
     IF you received an x-ray today, you will receive an invoice from [facility]. Please contact [facility] at [phone] with questions or concerns regarding your invoice.   IF you received labwork today, you will receive an invoice from [facility]. Please contact [facility] at [phone] with questions or concerns regarding your invoice.   Our billing staff will not be able to assist you with questions regarding bills from these companies.  You will be contacted with the lab results as soon as they are available. The fastest way to get your results is to activate your My Chart account. Instructions are located on the last page of this paperwork. If you have not heard from us regarding the results in 2 weeks, please contact this office.     

## 2018-03-11 LAB — WOUND CULTURE

## 2018-03-14 ENCOUNTER — Other Ambulatory Visit: Payer: Self-pay | Admitting: Family Medicine

## 2018-03-16 DIAGNOSIS — H35072 Retinal telangiectasis, left eye: Secondary | ICD-10-CM | POA: Diagnosis not present

## 2018-03-16 DIAGNOSIS — H353132 Nonexudative age-related macular degeneration, bilateral, intermediate dry stage: Secondary | ICD-10-CM | POA: Diagnosis not present

## 2018-03-16 DIAGNOSIS — H35071 Retinal telangiectasis, right eye: Secondary | ICD-10-CM | POA: Diagnosis not present

## 2018-03-16 DIAGNOSIS — H43812 Vitreous degeneration, left eye: Secondary | ICD-10-CM | POA: Diagnosis not present

## 2018-03-16 DIAGNOSIS — H4720 Unspecified optic atrophy: Secondary | ICD-10-CM | POA: Diagnosis not present

## 2018-03-17 DIAGNOSIS — S63657D Sprain of metacarpophalangeal joint of left little finger, subsequent encounter: Secondary | ICD-10-CM | POA: Diagnosis not present

## 2018-03-26 ENCOUNTER — Encounter: Payer: Self-pay | Admitting: Family Medicine

## 2018-03-26 ENCOUNTER — Other Ambulatory Visit: Payer: Self-pay

## 2018-03-26 ENCOUNTER — Ambulatory Visit (INDEPENDENT_AMBULATORY_CARE_PROVIDER_SITE_OTHER): Payer: Medicare Other | Admitting: Family Medicine

## 2018-03-26 VITALS — BP 118/70 | HR 65 | Temp 98.0°F | Resp 16 | Wt 204.0 lb

## 2018-03-26 DIAGNOSIS — M19042 Primary osteoarthritis, left hand: Secondary | ICD-10-CM

## 2018-03-26 DIAGNOSIS — M19041 Primary osteoarthritis, right hand: Secondary | ICD-10-CM

## 2018-03-26 DIAGNOSIS — M5136 Other intervertebral disc degeneration, lumbar region: Secondary | ICD-10-CM

## 2018-03-26 DIAGNOSIS — R519 Headache, unspecified: Secondary | ICD-10-CM

## 2018-03-26 DIAGNOSIS — M1712 Unilateral primary osteoarthritis, left knee: Secondary | ICD-10-CM

## 2018-03-26 DIAGNOSIS — I25118 Atherosclerotic heart disease of native coronary artery with other forms of angina pectoris: Secondary | ICD-10-CM | POA: Diagnosis not present

## 2018-03-26 DIAGNOSIS — R51 Headache: Secondary | ICD-10-CM | POA: Diagnosis not present

## 2018-03-26 DIAGNOSIS — G4733 Obstructive sleep apnea (adult) (pediatric): Secondary | ICD-10-CM

## 2018-03-26 DIAGNOSIS — E119 Type 2 diabetes mellitus without complications: Secondary | ICD-10-CM | POA: Diagnosis not present

## 2018-03-26 MED ORDER — DICLOFENAC SODIUM 1 % TD GEL: 4.0000 g | Freq: Four times a day (QID) | TRANSDERMAL | 5 refills | Status: DC

## 2018-03-26 NOTE — Progress Notes (Signed)
Subjective:    Patient ID: [name], male    DOB: 01/14/1946, 72 y.o.   MRN: [id number]  03/26/2018  Osteoarthritis and Headache    HPI This 72 y.o. male presents for an acute visit for evaluation of osteoarthritis knee LEFT and L lateral hand.  Using Voltaren gel for left knee arthritis and left hand arthritis.  Also will apply to lower back with severe pain upon awakening.  Works moderately well.  Marginally better than lidocaine.    R side of head sensitivity: R periauricular region.  Only hurts with pressing.  Mother died of CVA; concerned of CVA.  No trauma.  Worried about blood vessel trauma.  Hypercalcemia: Patient reports he chronically has been suffering with elevated calcium level.  Taking Vitamin D 5,000 IU three days per week. Taking Chlorathalidone.  Patient does not recall undergoing a work-up for elevated calcium level.    OSA: referred to [facility] for repeat CPAP by Darron Doom.  Last study in 06/2015 by [facility].  Still has been using CPAP machine for several years.  Cannot get supplies for it.   BP Readings from Last 3 Encounters:  03/26/18 118/70  03/09/18 122/76  02/07/18 (!) 146/70   Wt Readings from Last 3 Encounters:  03/26/18 204 lb (92.5 kg)  03/09/18 202 lb 3.2 oz (91.7 kg)  02/07/18 203 lb 6.4 oz (92.3 kg)   Immunization History  Administered Date(s) Administered  . Influenza, Seasonal, Injecte, Preservative Fre 02/02/2013  . Influenza,inj,Quad PF,6+ Mos 09/05/2013, 11/22/2014, 07/25/2015, 08/29/2016, 08/10/2017  . Pneumococcal Conjugate-13 11/22/2014  . Pneumococcal Polysaccharide-23 06/03/2016  . Zoster 11/22/2014    Review of Systems  Constitutional: Negative for activity change, appetite change, chills, diaphoresis, fatigue and fever.  Respiratory: Negative for cough and shortness of breath.   Cardiovascular: Negative for chest pain, palpitations and leg swelling.  Gastrointestinal: Negative for abdominal pain, diarrhea, nausea and vomiting.   Endocrine: Negative for cold intolerance, heat intolerance, polydipsia, polyphagia and polyuria.  Musculoskeletal: Positive for arthralgias, back pain, gait problem, joint swelling and myalgias.  Skin: Negative for color change, rash and wound.  Neurological: Positive for headaches. Negative for dizziness, tremors, seizures, syncope, facial asymmetry, speech difficulty, weakness, light-headedness and numbness.  Psychiatric/Behavioral: Negative for dysphoric mood and sleep disturbance. The patient is not nervous/anxious.     Past Medical History:  Diagnosis Date  . Arthritis    DDD, scoliosis, stenosis    . Atherosclerotic heart disease native coronary artery w/angina pectoris ([place])   . Calcium blood increased   . Cancer (HCC)    skin ca, posterior R ear   (BENIGN)  . Complication of anesthesia    difficulty intubation , anterior larynx, easy mask ventilation  . Coronary artery disease   . Diabetes mellitus without complication ([place])   . Difficult intubation   . Diverticulitis   . Glaucoma   . Headache(784.0)    migraines  . History of kidney stones   . Hyperlipemia   . Hypertension   . Sleep apnea    CPAP still in use q night, & for naps thru out the day, pt. reports that last study was neg for sleep apnea but he still uses the device    . Spinal stenosis   . Ventricular dilatation    Right   Past Surgical History:  Procedure Laterality Date  . APPENDECTOMY  1991  . BOWEL RESECTION  1985  . CARDIAC CATHETERIZATION  2006  . CATARACT EXTRACTION Right   . CHOLECYSTECTOMY  1990  . COLON SURGERY     bowel resection, for diverticulitis   . COLONOSCOPY N/A 06/15/2013   Procedure: COLONOSCOPY;  Surgeon: Beryle Beams, MD;  Location: [facility];  Service: Endoscopy;  Laterality: N/A;  . CORONARY ANGIOPLASTY     STENT PLACED  . LAMINECTOMY WITH POSTERIOR LATERAL ARTHRODESIS LEVEL 1 N/A 04/09/2017   Procedure: Lumbar TwoThree,Lumbar Three-Four,Lumbar Four-Five Laminectomy with  Lumbar Five-Sacral One Posterior lateral fusion;  Surgeon: Eustace Moore, MD;  Location: [facility];  Service: Neurosurgery;  Laterality: N/A;  . LITHOTRIPSY    . SPINAL CORD STIMULATOR INSERTION N/A 03/20/2016   Procedure: LUMBAR SPINAL CORD STIMULATOR INSERTION;  Surgeon: Clydell Hakim, MD;  Location: [facility];  Service: Neurosurgery;  Laterality: N/A;  . TONSILLECTOMY    . [place] ?   Allergies  Allergen Reactions  . Amaryl Other (See Comments)    Gives pt migraines  . Other Other (See Comments)    Pistachios, lima beans, and some types of chocolate cause migraines  Nasal corticosteroids causes nose bleeds  . Skelaxin [Metaxalone] Dermatitis    Cherry red rash with sloughing off of skin at left upper chest/shoulder  . Stool Softener [Dss] Other (See Comments)    Causes migraines  . Pistachio Nut (Diagnostic) Other (See Comments)     In  Addition :Lima beans, milk chocolate   . Doc-Q-Lax [Digital Fever Thermometer] Other (See Comments)    Headache   . Levofloxacin Nausea Only  . Lisinopril Other (See Comments)    cough  . Metformin And Related Diarrhea   Current Outpatient Medications on File Prior to Visit  Medication Sig Dispense Refill  . acetaminophen (TYLENOL) 650 MG CR tablet Take 650 mg by mouth 2 (two) times daily.    Marland Kitchen amLODipine (NORVASC) 10 MG tablet Take 0.5 tablets (5 mg total) by mouth daily. 90 tablet 0  . aspirin 81 MG tablet Take 81 mg by mouth every evening.     . bimatoprost (LUMIGAN) 0.01 % SOLN Place 1 drop into both eyes every evening.    . brimonidine-timolol (COMBIGAN) 0.2-0.5 % ophthalmic solution Place 1 drop into both eyes 2 (two) times daily.    . brinzolamide (AZOPT) 1 % ophthalmic suspension Place 1 drop into both eyes 2 (two) times daily.     . Cholecalciferol (VITAMIN D3) 5000 units CAPS Take 5,000 Units by mouth every Monday, Wednesday, and Friday.    . Coenzyme Q10 (COQ-10) 200 MG CAPS Take 200 mg by mouth daily.    .  empagliflozin (JARDIANCE) 25 MG TABS tablet Take 25 mg by mouth daily. 90 tablet 3  . Famotidine-Ca Carb-Mag Hydrox (PEPCID COMPLETE PO) Take 1 tablet by mouth daily as needed (acid reflux).    . FREESTYLE LITE test strip USE TO TEST BLOOD SUGAR ONCE A DAY 100 each 0  . ketoconazole (NIZORAL) 2 % cream APPLY TOPICALLY TWICE DAILY 60 g 0  . methocarbamol (ROBAXIN) 500 MG tablet methocarbamol 500 mg tablet  TK 1 T PO Q 6 H PRF MSP    . metoprolol tartrate (LOPRESSOR) 25 MG tablet Take 25 mg by mouth 2 (two) times daily.    . Multiple Vitamins-Minerals (PRESERVISION AREDS 2) CAPS Take 1 capsule by mouth 2 (two) times daily.    . mupirocin ointment (BACTROBAN) 2 % Apply 1 application topically 2 (two) times daily. 22 g 0  . Omega-3 Fatty Acids (FISH OIL) 1200 MG CAPS Take 1,200 mg  by mouth 2 (two) times daily.    Marland Kitchen OVER THE COUNTER MEDICATION Take 1,000 mg by mouth daily. microlactin otc supplement    . sitaGLIPtin (JANUVIA) 100 MG tablet Take 1 tablet (100 mg total) by mouth daily. 90 tablet 3  . sodium chloride (OCEAN) 0.65 % SOLN nasal spray Place 1 spray into both nostrils as needed (dryness).    . SUMAtriptan (IMITREX) 50 MG tablet TAKE 1 TABLET BY MOUTH EVERY 2 HOURS AS NEEDED FOR MIGRAINE 9 tablet 3  . SUPER B COMPLEX/C PO Take 1 tablet by mouth daily.    . TraMADol HCl 100 MG CP24 TK 1 C PO QD  1  . triazolam (HALCION) 0.125 MG tablet triazolam 0.125 mg tablet  TK 1 T PO QD HS PRF SLP     No current facility-administered medications on file prior to visit.    Social History   Socioeconomic History  . Marital status: Married    Spouse name: Not on file  . Number of children: 0  . Years of education: Masters +  . Highest education level: Not on file  Occupational History  . Occupation: Retired  Scientific laboratory technician  . Financial resource strain: Not on file  . Food insecurity:    Worry: Not on file    Inability: Not on file  . Transportation needs:    Medical: Not on file     Non-medical: Not on file  Tobacco Use  . Smoking status: Never Smoker  . Smokeless tobacco: Never Used  Substance and Sexual Activity  . Alcohol use: Yes    Alcohol/week: 0.0 oz    Comment: seldom   . Drug use: No  . Sexual activity: Yes  Lifestyle  . Physical activity:    Days per week: Not on file    Minutes per session: Not on file  . Stress: Not on file  Relationships  . Social connections:    Talks on phone: Not on file    Gets together: Not on file    Attends religious service: Not on file    Active member of club or organization: Not on file    Attends meetings of clubs or organizations: Not on file    Relationship status: Not on file  . Intimate partner violence:    Fear of current or ex partner: Not on file    Emotionally abused: Not on file    Physically abused: Not on file    Forced sexual activity: Not on file  Other Topics Concern  . Not on file  Social History Narrative   Marital status: married x 1969      Children: none      Lives: with wife, 1 dog      Employment: retired at age 71.  Education music      Tobacco: none      Alcohol: rare      Exercise: stationary bike in 2018; rare use in 2018 due to sick dog.      ADLs: independent with ADLs; drives      Advanced Directives:    2 cups of coffee a day, soda or tea about 3 times a week.    Family History  Problem Relation Age of Onset  . Heart disease Father   . Heart attack Father   . Heart disease Paternal Grandmother        Objective:    BP 118/70   Pulse 65   Temp 98 F (36.7 C) (Oral)  Resp 16   Wt 204 lb (92.5 kg)   SpO2 95%   BMI 28.24 kg/m  Physical Exam  Constitutional: He is oriented to person, place, and time. He appears well-developed and well-nourished. No distress.  HENT:  Head: Normocephalic and atraumatic.  Right Ear: External ear normal.  Left Ear: External ear normal.  Nose: Nose normal.  Mouth/Throat: Oropharynx is clear and moist. No oropharyngeal exudate.  No  temple artery prominence/cord or tenderness or erythema noted bilaterally.  Eyes: Pupils are equal, round, and reactive to light. Conjunctivae and EOM are normal.  Neck: Normal range of motion. Neck supple. No JVD present. Carotid bruit is not present. No thyromegaly present.  Cardiovascular: Normal rate, regular rhythm, normal heart sounds and intact distal pulses. Exam reveals no gallop and no friction rub.  No murmur heard. Pulmonary/Chest: Effort normal and breath sounds normal. He has no wheezes. He has no rales.  Abdominal: Soft. Bowel sounds are normal. He exhibits no distension and no mass. There is no tenderness. There is no rebound and no guarding.  Musculoskeletal:       Right shoulder: Normal.       Left shoulder: Normal.       Cervical back: Normal. He exhibits normal range of motion, no tenderness, no bony tenderness, no pain, no spasm and normal pulse.  Lymphadenopathy:    He has no cervical adenopathy.  Neurological: He is alert and oriented to person, place, and time. He has normal reflexes. No cranial nerve deficit. He exhibits normal muscle tone. Coordination normal.  Skin: Skin is warm and dry. No rash noted. He is not diaphoretic. No erythema.  Psychiatric: He has a normal mood and affect. His behavior is normal. Judgment and thought content normal.   No results found. Depression screen [facility] 2/9 03/26/2018 03/09/2018 02/07/2018 09/06/2017 08/10/2017  Decreased Interest 0 0 0 0 0  Down, Depressed, Hopeless 0 0 0 0 0  PHQ - 2 Score 0 0 0 0 0   Fall Risk  03/26/2018 03/09/2018 02/07/2018 09/06/2017 08/10/2017  Falls in the past year? No No No Yes Yes  Number falls in past yr: - - - 2 or more 2 or more  Injury with Fall? - - - No No  Risk for fall due to : - - - - -  Follow up - - - - -        Assessment & Plan:   1. Type 2 diabetes mellitus without complication, without long-term current use of insulin ([place])   2. Hypercalcemia   3. Acute nonintractable headache,  unspecified headache type   4. Primary osteoarthritis of left knee   5. Primary osteoarthritis of both hands     Osteoarthritis of bilateral hands and left knee: Moderately controlled.  Refill diclofenac gel 1% provided.  Hypercalcemia: Chronic.  Recommend patient hold his vitamin D supplementation.  If no improvement with holding vitamin D supplementation, will need to discontinue his chlorthalidone therapy.  Obtain parathyroid hormone intact.  Headache: New onset.  Actually only tenderness to palpation along preauricular area.  Obtain sedimentation rate.  Normal neurological exam.  Reassurance provided.  No suggestion of a risk factor from current symptoms for CVA.  Diabetes mellitus type 2: Stable at this time.  Orders Placed This Encounter  Procedures  . Basic metabolic panel  . Parathyroid hormone, intact (no Ca)  . Sedimentation rate  . Sedimentation rate   Meds ordered this encounter  Medications  . diclofenac sodium (VOLTAREN) 1 %  GEL    Sig: Apply 4 g topically 4 (four) times daily.    Dispense:  100 g    Refill:  5    No follow-ups on file.   Yanixan Mellinger Elayne Guerin, M.D. Primary Care at [facility] [address] [place], [place]  [address] [phone] phone [phone] fax

## 2018-03-26 NOTE — Patient Instructions (Addendum)
  Stop Vitamin D.  New physicians who I recommend:   IRMA Jessy Oto, MD Delia Chimes, MD Agustina Caroli, MD   IF you received an x-ray today, you will receive an invoice from [facility]. Please contact [facility] at [phone] with questions or concerns regarding your invoice.   IF you received labwork today, you will receive an invoice from [facility]. Please contact [facility] at [phone] with questions or concerns regarding your invoice.   Our billing staff will not be able to assist you with questions regarding bills from these companies.  You will be contacted with the lab results as soon as they are available. The fastest way to get your results is to activate your My Chart account. Instructions are located on the last page of this paperwork. If you have not heard from [place] regarding the results in 2 weeks, please contact this office.

## 2018-03-27 LAB — BASIC METABOLIC PANEL
BUN / CREAT RATIO: 24 (ref 10–24)
BUN: 19 mg/dL (ref 8–27)
CALCIUM: 11.2 mg/dL — AB (ref 8.6–10.2)
CHLORIDE: 101 mmol/L (ref 96–106)
CO2: 26 mmol/L (ref 20–29)
Creatinine, Ser: 0.78 mg/dL (ref 0.76–1.27)
GFR calc Af Amer: 104 mL/min/{1.73_m2} (ref >59)
GFR calc non Af Amer: 90 mL/min/{1.73_m2} (ref >59)
GLUCOSE: 168 mg/dL — AB (ref 65–99)
POTASSIUM: 3.3 mmol/L — AB (ref 3.5–5.2)
Sodium: 143 mmol/L (ref 134–144)

## 2018-03-27 LAB — SEDIMENTATION RATE: Sed Rate: 4 mm/hr (ref 0–30)

## 2018-03-27 LAB — PARATHYROID HORMONE, INTACT (NO CA): PTH: 47 pg/mL (ref 15–65)

## 2018-03-30 ENCOUNTER — Telehealth: Payer: Self-pay | Admitting: Family Medicine

## 2018-03-30 NOTE — Telephone Encounter (Signed)
Incoming fax from [employer] on [employer] requesting Prior authorization for Diclofenac 1% gel. Initiated through [facility]: Key: [id number] - PA Case ID: [id number]   Prior Auth approved through 11/15/2018.   Pharmacy notified. Phone call to patient. Patient notified of the above, he is appreciative of call.

## 2018-04-02 ENCOUNTER — Other Ambulatory Visit: Payer: Self-pay | Admitting: Family Medicine

## 2018-04-06 DIAGNOSIS — M961 Postlaminectomy syndrome, not elsewhere classified: Secondary | ICD-10-CM | POA: Diagnosis not present

## 2018-04-06 DIAGNOSIS — M47817 Spondylosis without myelopathy or radiculopathy, lumbosacral region: Secondary | ICD-10-CM | POA: Diagnosis not present

## 2018-04-13 ENCOUNTER — Encounter: Payer: Self-pay | Admitting: Family Medicine

## 2018-04-26 DIAGNOSIS — M961 Postlaminectomy syndrome, not elsewhere classified: Secondary | ICD-10-CM | POA: Diagnosis not present

## 2018-04-26 DIAGNOSIS — Z6827 Body mass index (BMI) 27.0-27.9, adult: Secondary | ICD-10-CM | POA: Diagnosis not present

## 2018-05-12 ENCOUNTER — Other Ambulatory Visit: Payer: Self-pay | Admitting: Family Medicine

## 2018-05-12 DIAGNOSIS — E785 Hyperlipidemia, unspecified: Secondary | ICD-10-CM

## 2018-05-29 ENCOUNTER — Other Ambulatory Visit: Payer: Self-pay | Admitting: Family Medicine

## 2018-05-29 DIAGNOSIS — E785 Hyperlipidemia, unspecified: Secondary | ICD-10-CM

## 2018-06-01 ENCOUNTER — Other Ambulatory Visit: Payer: Self-pay | Admitting: Gastroenterology

## 2018-06-01 DIAGNOSIS — Z86010 Personal history of colonic polyps: Secondary | ICD-10-CM | POA: Diagnosis not present

## 2018-06-01 DIAGNOSIS — R197 Diarrhea, unspecified: Secondary | ICD-10-CM | POA: Diagnosis not present

## 2018-06-01 DIAGNOSIS — K5730 Diverticulosis of large intestine without perforation or abscess without bleeding: Secondary | ICD-10-CM | POA: Diagnosis not present

## 2018-06-07 ENCOUNTER — Other Ambulatory Visit: Payer: Self-pay | Admitting: Neurological Surgery

## 2018-06-07 DIAGNOSIS — M4316 Spondylolisthesis, lumbar region: Secondary | ICD-10-CM | POA: Diagnosis not present

## 2018-06-21 DIAGNOSIS — H401133 Primary open-angle glaucoma, bilateral, severe stage: Secondary | ICD-10-CM | POA: Diagnosis not present

## 2018-06-26 ENCOUNTER — Encounter: Payer: Self-pay | Admitting: Family Medicine

## 2018-06-28 ENCOUNTER — Other Ambulatory Visit (HOSPITAL_COMMUNITY): Payer: Medicare Other

## 2018-07-01 ENCOUNTER — Ambulatory Visit (HOSPITAL_COMMUNITY)
Admission: RE | Admit: 2018-07-01 | Discharge: 2018-07-01 | Disposition: A | Discharge status: Home / Self Care | Payer: Medicare Other | Source: Ambulatory Visit | Attending: Gastroenterology | Admitting: Gastroenterology

## 2018-07-01 ENCOUNTER — Ambulatory Visit (HOSPITAL_COMMUNITY): Payer: Medicare Other | Admitting: Registered Nurse

## 2018-07-01 ENCOUNTER — Other Ambulatory Visit: Payer: Self-pay

## 2018-07-01 ENCOUNTER — Encounter (HOSPITAL_COMMUNITY)
Admission: RE | Disposition: A | Discharge status: Home / Self Care | Payer: Self-pay | Source: Ambulatory Visit | Attending: Gastroenterology

## 2018-07-01 ENCOUNTER — Encounter (HOSPITAL_COMMUNITY): Payer: Self-pay | Admitting: *Deleted

## 2018-07-01 DIAGNOSIS — D122 Benign neoplasm of ascending colon: Secondary | ICD-10-CM | POA: Insufficient documentation

## 2018-07-01 DIAGNOSIS — E119 Type 2 diabetes mellitus without complications: Secondary | ICD-10-CM | POA: Insufficient documentation

## 2018-07-01 DIAGNOSIS — Z1211 Encounter for screening for malignant neoplasm of colon: Secondary | ICD-10-CM | POA: Insufficient documentation

## 2018-07-01 DIAGNOSIS — E785 Hyperlipidemia, unspecified: Secondary | ICD-10-CM | POA: Diagnosis not present

## 2018-07-01 DIAGNOSIS — Z86010 Personal history of colonic polyps: Secondary | ICD-10-CM | POA: Insufficient documentation

## 2018-07-01 DIAGNOSIS — I2510 Atherosclerotic heart disease of native coronary artery without angina pectoris: Secondary | ICD-10-CM | POA: Diagnosis not present

## 2018-07-01 DIAGNOSIS — Z881 Allergy status to other antibiotic agents status: Secondary | ICD-10-CM | POA: Diagnosis not present

## 2018-07-01 DIAGNOSIS — D124 Benign neoplasm of descending colon: Secondary | ICD-10-CM | POA: Insufficient documentation

## 2018-07-01 DIAGNOSIS — Z885 Allergy status to narcotic agent status: Secondary | ICD-10-CM | POA: Insufficient documentation

## 2018-07-01 DIAGNOSIS — Z888 Allergy status to other drugs, medicaments and biological substances status: Secondary | ICD-10-CM | POA: Diagnosis not present

## 2018-07-01 DIAGNOSIS — Z955 Presence of coronary angioplasty implant and graft: Secondary | ICD-10-CM | POA: Insufficient documentation

## 2018-07-01 DIAGNOSIS — G4730 Sleep apnea, unspecified: Secondary | ICD-10-CM | POA: Diagnosis not present

## 2018-07-01 DIAGNOSIS — Z7984 Long term (current) use of oral hypoglycemic drugs: Secondary | ICD-10-CM | POA: Diagnosis not present

## 2018-07-01 DIAGNOSIS — I119 Hypertensive heart disease without heart failure: Secondary | ICD-10-CM | POA: Insufficient documentation

## 2018-07-01 DIAGNOSIS — D123 Benign neoplasm of transverse colon: Secondary | ICD-10-CM | POA: Diagnosis not present

## 2018-07-01 DIAGNOSIS — Z791 Long term (current) use of non-steroidal anti-inflammatories (NSAID): Secondary | ICD-10-CM | POA: Diagnosis not present

## 2018-07-01 DIAGNOSIS — Z79899 Other long term (current) drug therapy: Secondary | ICD-10-CM | POA: Diagnosis not present

## 2018-07-01 HISTORY — PX: POLYPECTOMY: SHX5525

## 2018-07-01 HISTORY — PX: COLONOSCOPY WITH PROPOFOL: SHX5780

## 2018-07-01 LAB — GLUCOSE, CAPILLARY: GLUCOSE-CAPILLARY: 135 mg/dL — AB (ref 70–99)

## 2018-07-01 SURGERY — COLONOSCOPY WITH PROPOFOL
Anesthesia: Monitor Anesthesia Care

## 2018-07-01 MED ORDER — PROPOFOL 10 MG/ML IV BOLUS
INTRAVENOUS | Status: AC
  Filled 2018-07-01: qty 40

## 2018-07-01 MED ORDER — PROPOFOL 10 MG/ML IV BOLUS
INTRAVENOUS | Status: DC | PRN
  Administered 2018-07-01: 20 mg via INTRAVENOUS

## 2018-07-01 MED ORDER — EPHEDRINE SULFATE-NACL 50-0.9 MG/10ML-% IV SOSY
PREFILLED_SYRINGE | INTRAVENOUS | Status: DC | PRN
  Administered 2018-07-01 (×3): 5 mg via INTRAVENOUS

## 2018-07-01 MED ORDER — PROPOFOL 500 MG/50ML IV EMUL
INTRAVENOUS | Status: DC | PRN
  Administered 2018-07-01: 140 ug/kg/min via INTRAVENOUS

## 2018-07-01 MED ORDER — LACTATED RINGERS IV SOLN
INTRAVENOUS | Status: DC
  Administered 2018-07-01: 08:00:00 via INTRAVENOUS

## 2018-07-01 MED ORDER — PROPOFOL 10 MG/ML IV BOLUS
INTRAVENOUS | Status: AC
  Filled 2018-07-01: qty 20

## 2018-07-01 MED ORDER — SODIUM CHLORIDE 0.9 % IV SOLN: INTRAVENOUS | Status: DC

## 2018-07-01 MED ORDER — GLYCOPYRROLATE 0.2 MG/ML IJ SOLN
INTRAMUSCULAR | Status: DC | PRN
  Administered 2018-07-01: 0.2 mg via INTRAVENOUS

## 2018-07-01 SURGICAL SUPPLY — 21 items

## 2018-07-01 NOTE — Anesthesia Preprocedure Evaluation (Addendum)
Anesthesia Evaluation  Patient identified by MRN, date of birth, ID band Patient awake    History of Anesthesia Complications (+) DIFFICULT AIRWAY  Airway Mallampati: II  TM Distance: >3 FB   Mouth opening: Limited Mouth Opening  Dental   Pulmonary sleep apnea ,    breath sounds clear to auscultation       Cardiovascular hypertension, + angina + CAD   Rhythm:Regular Rate:Normal     Neuro/Psych    GI/Hepatic Neg liver ROS, History noted. CG   Endo/Other  diabetes  Renal/GU negative Renal ROS     Musculoskeletal   Abdominal   Peds  Hematology   Anesthesia Other Findings   Reproductive/Obstetrics                             Anesthesia Physical Anesthesia Plan  ASA: III  Anesthesia Plan: MAC   Post-op Pain Management:    Induction: Intravenous  PONV Risk Score and Plan: Treatment may vary due to age or medical condition  Airway Management Planned: Simple Face Mask and Nasal Cannula  Additional Equipment:   Intra-op Plan:   Post-operative Plan:   Informed Consent: I have reviewed the patients History and Physical, chart, labs and discussed the procedure including the risks, benefits and alternatives for the proposed anesthesia with the patient or authorized representative who has indicated his/her understanding and acceptance.   Dental advisory given  Plan Discussed with: CRNA and Anesthesiologist  Anesthesia Plan Comments:         Anesthesia Quick Evaluation

## 2018-07-01 NOTE — Transfer of Care (Signed)
Immediate Anesthesia Transfer of Care Note  Patient: [name]  Procedure(s) Performed: COLONOSCOPY WITH PROPOFOL (N/A )  Patient Location: PACU and Endoscopy Unit  Anesthesia Type:MAC  Level of Consciousness: awake, alert , oriented and patient cooperative  Airway & Oxygen Therapy: Patient Spontanous Breathing and Patient connected to face mask oxygen  Post-op Assessment: Report given to RN, Post -op Vital signs reviewed and stable and Patient moving all extremities  Post vital signs: Reviewed and stable  Last Vitals:  Vitals Value Taken Time  BP 106/55 07/01/2018 10:06 AM  Temp    Pulse 42 07/01/2018 10:07 AM  Resp 12 07/01/2018 10:07 AM  SpO2 100 % 07/01/2018 10:07 AM  Vitals shown include unvalidated device data.  Last Pain:  Vitals:   07/01/18 0810  TempSrc: Oral  PainSc: 0-No pain         Complications: No apparent anesthesia complications

## 2018-07-01 NOTE — H&P (Signed)
[name] HPI: The patient has a personal history of polyps.  He is here for a follow up colonoscopy.  Past Medical History:  Diagnosis Date  . Arthritis    DDD, scoliosis, stenosis    . 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 Calcium blood increased   . Cancer (HCC)    skin ca, posterior R ear   (BENIGN)  . Complication of anesthesia    difficulty intubation , anterior larynx, easy mask ventilation  . Coronary artery disease   . Diabetes mellitus without complication ([place])   . Difficult intubation   . Diverticulitis   . Glaucoma   . Headache(784.0)    migraines  . History of kidney stones   . Hyperlipemia   . Hypertension   . Sleep apnea    CPAP still in use q night, & for naps thru out the day, pt. reports that last study was neg for sleep apnea but he still uses the device    . Spinal stenosis   . Ventricular dilatation    Right    Past Surgical History:  Procedure Laterality Date  . APPENDECTOMY  1991  . BOWEL RESECTION  1985  . CARDIAC CATHETERIZATION  2006  . CATARACT EXTRACTION Right   . CHOLECYSTECTOMY  1990  . COLON SURGERY     bowel resection, for diverticulitis   . COLONOSCOPY N/A 06/15/2013   Procedure: COLONOSCOPY;  Surgeon: Beryle Beams, MD;  Location: [facility];  Service: Endoscopy;  Laterality: N/A;  . CORONARY ANGIOPLASTY     STENT PLACED  . LAMINECTOMY WITH POSTERIOR LATERAL ARTHRODESIS LEVEL 1 N/A 04/09/2017   Procedure: Lumbar TwoThree,Lumbar Three-Four,Lumbar Four-Five Laminectomy with Lumbar Five-Sacral One Posterior lateral fusion;  Surgeon: Eustace Moore, MD;  Location: [facility];  Service: Neurosurgery;  Laterality: N/A;  . LITHOTRIPSY    . SPINAL CORD STIMULATOR INSERTION N/A 03/20/2016   Procedure: LUMBAR SPINAL CORD STIMULATOR INSERTION;  Surgeon: Clydell Hakim, MD;  Location: [facility];  Service: Neurosurgery;  Laterality: N/A;  . TONSILLECTOMY    . [place] ?     Family History  Problem Relation Age of Onset  . Heart disease Father   . Heart attack Father   . Heart disease Paternal Grandmother     Social History:  reports that he has never smoked. He has never used smokeless tobacco. He reports that he drinks alcohol. He reports that he does not use drugs.  Allergies:  Allergies  Allergen Reactions  . Amaryl Other (See Comments)    Gives pt migraines  . Other Other (See Comments)    Pistachios, lima beans, and some types of chocolate cause migraines  Nasal corticosteroids causes nose bleeds  . Skelaxin [Metaxalone] Dermatitis and Other (See Comments)    Cherry red rash with sloughing off of skin at left upper chest/shoulder  . Stool Softener [Dss] Other (See Comments)    Causes migraines  . Norco [Hydrocodone-Acetaminophen] Other (See Comments)    Ineffective  . Pistachio Nut (Diagnostic) Other (See Comments)     In  Addition :Lima beans, milk chocolate   . Doc-Q-Lax [Digital Fever Thermometer] Other (See Comments)    Headache   . Levofloxacin Nausea Only  . Lisinopril Cough  . Metformin And Related Diarrhea    Medications:  Scheduled:  Continuous: . lactated ringers 20 mL/hr at 07/01/18 0815    Results for orders placed or performed during the  hospital encounter of 07/01/18 (from the past 24 hour(s))  Glucose, capillary     Status: Abnormal   Collection Time: 07/01/18  8:13 AM  Result Value Ref Range   Glucose-Capillary 135 (H) 70 - 99 mg/dL     No results found.  ROS:  As stated above in the HPI otherwise negative.  Blood pressure 130/76, pulse (!) 53, temperature 98.4 F (36.9 C), temperature source Oral, resp. rate 14, height 5' 11.25" (1.81 m), weight 96.2 kg, SpO2 99 %.    PE: Gen: NAD, Alert and Oriented HEENT:  [place]/AT, EOMI Neck: Supple, no LAD Lungs: CTA Bilaterally CV: RRR without M/G/R ABM: Soft, NTND, +BS Ext: No C/C/E  Assessment/Plan: 1) Personal history of polyps -  colonoscopy.  [name] D 07/01/2018, 9:31 AM

## 2018-07-01 NOTE — Anesthesia Postprocedure Evaluation (Signed)
Anesthesia Post Note  Patient: [name]  Procedure(s) Performed: COLONOSCOPY WITH PROPOFOL (N/A )     Patient location during evaluation: PACU Anesthesia Type: MAC Level of consciousness: awake Pain management: pain level controlled Vital Signs Assessment: post-procedure vital signs reviewed and stable Respiratory status: spontaneous breathing Cardiovascular status: stable Anesthetic complications: no    Last Vitals:  Vitals:   07/01/18 1020 07/01/18 1030  BP: 103/60 115/61  Pulse: (!) 52 (!) 47  Resp: 16 13  Temp:    SpO2: 94% 93%    Last Pain:  Vitals:   07/01/18 0810  TempSrc: Oral  PainSc: 0-No pain                 [name]

## 2018-07-01 NOTE — Discharge Instructions (Signed)

## 2018-07-01 NOTE — Op Note (Signed)
[facility] Patient Name: [name] Procedure Date: 07/01/2018 MRN: [id number] Attending MD: Carol Ada , MD Date of Birth: 1945-12-20 CSN: [id number] Age: 72 Admit Type: Outpatient Procedure:                Colonoscopy Indications:              High risk colon cancer surveillance: Personal                            history of colonic polyps Providers:                Carol Ada, MD, Burtis Junes, RN, William Dalton,                            Technician Referring MD:              Medicines:                Propofol per Anesthesia Complications:            No immediate complications. Estimated Blood Loss:     Estimated blood loss was minimal. Procedure:                Pre-Anesthesia Assessment:                           - Prior to the procedure, a History and Physical                            was performed, and patient medications and                            allergies were reviewed. The patient's tolerance of                            previous anesthesia was also reviewed. The risks                            and benefits of the procedure and the sedation                            options and risks were discussed with the patient.                            All questions were answered, and informed consent                            was obtained. Prior Anticoagulants: The patient has                            taken no previous anticoagulant or antiplatelet                            agents. ASA Grade Assessment: III - A patient with                            severe  systemic disease. After reviewing the risks                            and benefits, the patient was deemed in                            satisfactory condition to undergo the procedure.                           - Sedation was administered by an anesthesia                            professional. Deep sedation was attained.                           After obtaining informed consent, the  colonoscope                            was passed under direct vision. Throughout the                            procedure, the patient's blood pressure, pulse, and                            oxygen saturations were monitored continuously. The                            CF-HQ190L ([id number]) Olympus adult colonoscope was                            introduced through the anus and advanced to the the                            cecum, identified by appendiceal orifice and                            ileocecal valve. The colonoscopy was performed                            without difficulty. The patient tolerated the                            procedure well. The quality of the bowel                            preparation was good. The ileocecal valve,                            appendiceal orifice, and rectum were photographed. Scope In: 9:37:10 AM Scope Out: 10:00:56 AM Scope Withdrawal Time: 0 hours 18 minutes 10 seconds  Total Procedure Duration: 0 hours 23 minutes 46 seconds  Findings:      Six sessile polyps were found in the descending colon, transverse colon       and ascending colon. The polyps were 2 to 3  mm in size. These polyps       were removed with a cold snare. Resection was complete, but the polyp       tissue was only partially retrieved. Impression:               - Six 2 to 3 mm polyps in the descending colon, in                            the transverse colon and in the ascending colon,                            removed with a cold snare. Complete resection.                            Partial retrieval. Moderate Sedation:      N/A- Per Anesthesia Care Recommendation:           - Patient has a contact number available for                            emergencies. The signs and symptoms of potential                            delayed complications were discussed with the                            patient. Return to normal activities tomorrow.                             Written discharge instructions were provided to the                            patient.                           - Resume previous diet.                           - Continue present medications.                           - Await pathology results.                           - Repeat colonoscopy in 3 years for surveillance. Procedure Code(s):        --- Professional ---                           [phone], Colonoscopy, flexible; with removal of                            tumor(s), polyp(s), or other lesion(s) by snare                            technique Diagnosis Code(s):        --- Professional ---  D12.4, Benign neoplasm of descending colon                           Z86.010, Personal history of colonic polyps                           D12.3, Benign neoplasm of transverse colon (hepatic                            flexure or splenic flexure)                           D12.2, Benign neoplasm of ascending colon CPT copyright 2017 American Medical Association. All rights reserved. The codes documented in this report are preliminary and upon coder review may  be revised to meet current compliance requirements. Carol Ada, MD Carol Ada, MD 07/01/2018 10:06:25 AM This report has been signed electronically. Number of Addenda: 0

## 2018-07-04 ENCOUNTER — Encounter (HOSPITAL_COMMUNITY): Payer: Self-pay | Admitting: Gastroenterology

## 2018-07-07 ENCOUNTER — Inpatient Hospital Stay: Admit: 2018-07-07 | Payer: Medicare Other | Admitting: Neurological Surgery

## 2018-07-07 SURGERY — POSTERIOR LUMBAR FUSION 1 LEVEL
Anesthesia: General | Site: Back

## 2018-07-11 ENCOUNTER — Other Ambulatory Visit: Payer: Self-pay | Admitting: Family Medicine

## 2018-07-11 NOTE — Telephone Encounter (Signed)
Request refill on free style test strips. Not on current Med list.  PCP Previous pt of K Smith. Appointment scheduled with Dr Pamella Pert 08/16/2018.  Last office visit 03/26/18 Pharmacy [facility], [address] [place]  This was [place] on 07/01/18 at discharge.  Unable to order new RX.

## 2018-08-15 ENCOUNTER — Encounter: Payer: Medicare Other | Admitting: Family Medicine

## 2018-08-16 ENCOUNTER — Encounter: Payer: Medicare Other | Admitting: Family Medicine

## 2018-08-18 ENCOUNTER — Other Ambulatory Visit: Payer: Self-pay | Admitting: Family Medicine

## 2018-08-18 ENCOUNTER — Other Ambulatory Visit: Payer: Self-pay | Admitting: *Deleted

## 2018-08-18 DIAGNOSIS — E785 Hyperlipidemia, unspecified: Secondary | ICD-10-CM

## 2018-08-18 MED ORDER — ROSUVASTATIN CALCIUM 10 MG PO TABS: 10.0000 mg | ORAL_TABLET | Freq: Every day | ORAL | 0 refills | Status: DC

## 2018-08-18 MED ORDER — KETOCONAZOLE 2 % EX CREA: TOPICAL_CREAM | Freq: Two times a day (BID) | CUTANEOUS | 0 refills | Status: DC

## 2018-08-18 MED ORDER — AMLODIPINE BESYLATE 10 MG PO TABS: 2.5000 mg | ORAL_TABLET | Freq: Every day | ORAL | 0 refills | Status: DC

## 2018-08-18 NOTE — Progress Notes (Signed)
Requested Prescriptions  Pending Prescriptions Disp Refills  . amLODipine (NORVASC) 10 MG tablet 90 tablet 0    Sig: Take 0.5 tablets (5 mg total) by mouth daily.     There is no refill protocol information for this order    . ketoconazole (NIZORAL) 2 % cream 60 g 0    Sig: Apply topically 2 (two) times daily.     There is no refill protocol information for this order    . rosuvastatin (CRESTOR) 10 MG tablet 90 tablet 0    Sig: Take 1 tablet (10 mg total) by mouth daily.     There is no refill protocol information for this order

## 2018-08-23 ENCOUNTER — Ambulatory Visit (INDEPENDENT_AMBULATORY_CARE_PROVIDER_SITE_OTHER): Payer: Medicare Other | Admitting: Family Medicine

## 2018-08-23 ENCOUNTER — Encounter: Payer: Self-pay | Admitting: Family Medicine

## 2018-08-23 ENCOUNTER — Other Ambulatory Visit: Payer: Self-pay

## 2018-08-23 VITALS — BP 140/74 | HR 69 | Temp 97.9°F | Ht 71.25 in | Wt 210.6 lb

## 2018-08-23 DIAGNOSIS — E785 Hyperlipidemia, unspecified: Secondary | ICD-10-CM

## 2018-08-23 DIAGNOSIS — E119 Type 2 diabetes mellitus without complications: Secondary | ICD-10-CM

## 2018-08-23 DIAGNOSIS — Z23 Encounter for immunization: Secondary | ICD-10-CM | POA: Diagnosis not present

## 2018-08-23 DIAGNOSIS — I10 Essential (primary) hypertension: Secondary | ICD-10-CM

## 2018-08-23 DIAGNOSIS — Z0000 Encounter for general adult medical examination without abnormal findings: Secondary | ICD-10-CM | POA: Diagnosis not present

## 2018-08-23 DIAGNOSIS — I25118 Atherosclerotic heart disease of native coronary artery with other forms of angina pectoris: Secondary | ICD-10-CM

## 2018-08-23 MED ORDER — SITAGLIPTIN PHOSPHATE 100 MG PO TABS: 100.0000 mg | ORAL_TABLET | Freq: Every day | ORAL | 3 refills | Status: DC

## 2018-08-23 MED ORDER — EMPAGLIFLOZIN 25 MG PO TABS: 25.0000 mg | ORAL_TABLET | Freq: Every day | ORAL | 3 refills | Status: DC

## 2018-08-23 NOTE — Progress Notes (Signed)
10/8/20192:17 PM  [name] 1946/08/13, 72 y.o. male [id number]  Chief Complaint  Patient presents with  . Medicare Wellness    HPI:   Patient is a 72 y.o. male with past medical history significant for HTN, CAD, OSA, migraine, DDD s/p lumbar fusion, HLP , DM2, glaucoma who presents today for Annual Wellness Visit  Last AWV 08/10/18  Colorectal Cancer Screening: + polyps, 06/2018, repeat in 5 years, Dr Benson [place] Seasonal Influenza Vaccination: will get today Td/Tdap Vaccination: not covered by insurance Pneumococcal Vaccination: 2017 Zoster Vaccination: thinks he got it last year Frequency of Dental evaluation: Q6 months Frequency of Eye evaluation: sees Dr Katy Fitch regularly, wears eyeglasses  Functional Status Survey: Is the patient deaf or have difficulty hearing?: Yes(has tinnitus, been evaluated by audiology) Does the patient have difficulty seeing, even when wearing glasses/contacts?: Yes(right eye vision problems) Does the patient have difficulty concentrating, remembering, or making decisions?: Yes(has trouble recalling words, occaional problems with directions, long term memory is ok) Does the patient have difficulty walking or climbing stairs?: Yes(due to OA of knees and chronic back pain) Does the patient have difficulty dressing or bathing?: No(now hsa a sitting shower, has standing for long periods of time difficult due to MSK issues) Does the patient have difficulty doing errands alone such as visiting a doctor's office or shopping?: Yes(has decresaed outings due to back pain)  Patient Care Team: Rutherford Guys, MD as PCP - General (Family Medicine) Robyn Haber, MD (Family Medicine) Robyn Haber, MD (Family Medicine) Adrian Prows, MD as Consulting Physician (Cardiology)  6CIT Screen 08/23/2018  What Year? 0 points  What month? 0 points  What time? 0 points  Count back from 20 0 points  Months in reverse 0 points  Repeat phrase 2 points  Total Score  2   Advance directives in place He reports code status DNR Wife is assigned HCPOA  Lab Results  Component Value Date   HGBA1C 6.3 (H) 02/07/2018   HGBA1C 6.1 08/10/2017   HGBA1C 6.3 (H) 04/01/2017   Lab Results  Component Value Date   MICROALBUR 0.5 06/03/2016   [place] 78 02/07/2018   CREATININE 0.78 03/26/2018    Fall Risk  08/23/2018 08/23/2018 03/26/2018 03/09/2018 02/07/2018  Falls in the past year? _0   Number falls in past yr: - - - - -  Injury with Fall? - - - - -  Risk for fall due to : - - - - -  Follow up - - - - -     Depression screen [facility] 2/9 08/23/2018 08/23/2018 03/26/2018  Decreased Interest 0 0 0  Down, Depressed, Hopeless 0 0 0  PHQ - 2 Score 0 0 0    Allergies  Allergen Reactions  . Amaryl Other (See Comments)    Gives pt migraines  . Other Other (See Comments)    Pistachios, lima beans, and some types of chocolate cause migraines  Nasal corticosteroids causes nose bleeds  . Skelaxin [Metaxalone] Dermatitis and Other (See Comments)    Cherry red rash with sloughing off of skin at left upper chest/shoulder  . Stool Softener [Dss] Other (See Comments)    Causes migraines  . Norco [Hydrocodone-Acetaminophen] Other (See Comments)    Ineffective  . Pistachio Nut (Diagnostic) Other (See Comments)     In  Addition :Lima beans, milk chocolate   . Doc-Q-Lax [Digital Fever Thermometer] Other (See Comments)    Headache   . Levofloxacin Nausea Only  . Lisinopril  Cough  . Metformin And Related Diarrhea    Prior to Admission medications   Medication Sig Start Date End Date Taking? Authorizing Provider  amLODipine (NORVASC) 10 MG tablet Take 0.5 tablets (5 mg total) by mouth daily. 08/18/18  Yes Rutherford Guys, MD  aspirin 81 MG tablet Take 81 mg by mouth every evening.    Yes [provider]  bimatoprost (LUMIGAN) 0.01 % SOLN Place 1 drop into both eyes every evening.   Yes [provider]  brimonidine-timolol (COMBIGAN) 0.2-0.5  % ophthalmic solution Place 1 drop into both eyes 2 (two) times daily.   Yes [provider]  brinzolamide (AZOPT) 1 % ophthalmic suspension Place 1 drop into both eyes 2 (two) times daily.    Yes [provider]  chlorthalidone (HYGROTON) 25 MG tablet TAKE 1 TABLET(25 MG) BY MOUTH DAILY Patient taking differently: Take 25 mg by mouth daily.  05/13/18  Yes Wardell Honour, MD  Coenzyme Q10 (COQ-10) 200 MG CAPS Take 200 mg by mouth daily.   Yes [provider]  diclofenac sodium (VOLTAREN) 1 % GEL Apply 4 g topically 4 (four) times daily. Patient taking differently: Apply 4 g topically daily.  03/26/18  Yes Wardell Honour, MD  empagliflozin (JARDIANCE) 25 MG TABS tablet Take 25 mg by mouth daily. 08/10/17  Yes Wardell Honour, MD  FREESTYLE LITE test strip USE TO TEST BLOOD SUGAR ONCE DAILY 07/20/18  Yes Delia Chimes A, MD  ketoconazole (NIZORAL) 2 % cream Apply topically 2 (two) times daily. 08/18/18  Yes Rutherford Guys, MD  metoprolol tartrate (LOPRESSOR) 25 MG tablet Take 25 mg by mouth 2 (two) times daily.   Yes [provider]  Multiple Vitamins-Minerals (PRESERVISION AREDS 2) CAPS Take 1 capsule by mouth 2 (two) times daily.   Yes [provider]  naproxen sodium (ALEVE) 220 MG tablet Take 220 mg by mouth at bedtime.   Yes [provider]  Omega-3 Fatty Acids (FISH OIL) 1200 MG CAPS Take 1,200 mg by mouth 2 (two) times daily.   Yes [provider]  OVER THE COUNTER MEDICATION Take 1,000 mg by mouth daily. Microlactin 1000 mg Supplement   Yes [provider]  OVER THE COUNTER MEDICATION Take 2-3 drops by mouth See admin instructions. CBD Hemp Oil - Place 3 drops in mouth in the morning, place 2 drops in mouth at lunch and dinner, and place 3 drops in mouth at bedtime.   Yes [provider]  rosuvastatin (CRESTOR) 10 MG tablet Take 1 tablet (10 mg total) by mouth daily. 08/18/18  Yes Rutherford Guys, MD  sitaGLIPtin  (JANUVIA) 100 MG tablet Take 1 tablet (100 mg total) by mouth daily. 08/10/17  Yes Wardell Honour, MD  SUMAtriptan (IMITREX) 50 MG tablet TAKE 1 TABLET BY MOUTH EVERY 2 HOURS AS NEEDED FOR MIGRAINE Patient taking differently: Take 50 mg by mouth every 2 (two) hours as needed for migraine.  03/05/17  Yes Wardell Honour, MD  acetaminophen (TYLENOL) 650 MG CR tablet Take 650 mg by mouth 5 (five) times daily.     [provider]  Alum Hydroxide-Mag Trisilicate (GAVISCON) 52-84.1 MG CHEW Chew 1 tablet by mouth daily as needed (for acid reflux).    [provider]  OVER THE COUNTER MEDICATION Take 1 tablet by mouth 2 (two) times daily. Turmeric-Ginger Supplement    [provider]  traMADol (ULTRAM-ER) 100 MG 24 hr tablet Take 100 mg by mouth daily  as needed for pain.    [provider]    Past Medical History:  Diagnosis Date  . Arthritis    DDD, scoliosis, stenosis    . 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 Calcium blood increased   . Cancer (HCC)    skin ca, posterior R ear   ([place])  . Complication of anesthesia    difficulty intubation , anterior larynx, easy mask ventilation  . Coronary artery disease   . Diabetes mellitus without complication ([place])   . Difficult intubation   . Diverticulitis   . Glaucoma   . Headache(784.0)    migraines  . History of kidney stones   . Hyperlipemia   . Hypertension   . Sleep apnea    CPAP still in use q night, & for naps thru out the day, pt. reports that last study was neg for sleep apnea but he still uses the device    . Spinal stenosis   . Ventricular dilatation    Right    Past Surgical History:  Procedure Laterality Date  . APPENDECTOMY  1991  . BOWEL RESECTION  1985  . CARDIAC CATHETERIZATION  2006  . CATARACT EXTRACTION Right   . CHOLECYSTECTOMY  1990  . COLON SURGERY     bowel resection, for diverticulitis   . COLONOSCOPY N/A 06/15/2013   Procedure:  COLONOSCOPY;  Surgeon: Beryle Beams, MD;  [facility];  Service: Endoscopy;  Laterality: N/A;  . COLONOSCOPY WITH PROPOFOL N/A 07/01/2018   Procedure: COLONOSCOPY WITH PROPOFOL;  Surgeon: Carol Ada, MD;  Location[facility];  Service: Endoscopy;  Laterality: N/A;  . CORONARY ANGIOPLASTY     STENT PLACED  . LAMINECTOMY WITH POSTERIOR LATERAL ARTHRODESIS LEVEL 1 N/A 04/09/2017   Procedure: Lumbar TwoThree,Lumbar Three-Four,Lumbar Four-Five Laminectomy with Lumbar Five-Sacral One Posterior lateral fusion;  Surgeon: Eustace Moore, MD;  Location: [facility];  Service: Neurosurgery;  Laterality: N/A;  . LITHOTRIPSY    . POLYPECTOMY  07/01/2018   Procedure: POLYPECTOMY;  Surgeon: Carol Ada, MD;  Location: [facility];  Service: Endoscopy;;  . SPINAL CORD STIMULATOR INSERTION N/A 03/20/2016   Procedure: LUMBAR SPINAL CORD STIMULATOR INSERTION;  Surgeon: Clydell Hakim, MD;  Location: [facility];  Service: Neurosurgery;  Laterality: N/A;  . TONSILLECTOMY    . [place] ?    Social History   Tobacco Use  . Smoking status: Never Smoker  . Smokeless tobacco: Never Used  Substance Use Topics  . Alcohol use: Yes    Alcohol/week: 0.0 standard drinks    Comment: seldom     Family History  Problem Relation Age of Onset  . Heart disease Father   . Heart attack Father   . Heart disease Paternal Grandmother     Review of Systems  Constitutional: Negative for chills and fever.  HENT: Positive for hearing loss and tinnitus.   Eyes:       Per hpi  Respiratory: Negative for cough and shortness of breath.   Cardiovascular: Positive for chest pain (intermittent very short lived angina, cards aware) and leg swelling. Negative for palpitations.  Gastrointestinal: Positive for constipation. Negative for abdominal pain, nausea and vomiting.  Genitourinary: Negative.   Musculoskeletal: Positive for back pain and joint pain.  Skin:       Several sebaceous cysts and  blackhead that get inflamed occasionally  Neurological: Positive for tingling (numbness of great toes).  Psychiatric/Behavioral: Positive for  memory loss.  All other systems reviewed and are negative.    OBJECTIVE:  Blood pressure 140/74, pulse 69, temperature 97.9 F (36.6 C), temperature source Oral, height 5' 11.25" (1.81 m), weight 210 lb 9.6 oz (95.5 kg), SpO2 91 %. Body mass index is 29.17 kg/m.   Physical Exam  Constitutional: He is oriented to person, place, and time. He appears well-developed and well-nourished.  HENT:  Head: Normocephalic and atraumatic.  Right Ear: Hearing, tympanic membrane, external ear and ear canal normal.  Left Ear: Hearing, tympanic membrane, external ear and ear canal normal.  Mouth/Throat: Oropharynx is clear and moist. No oropharyngeal exudate.  Eyes: Pupils are equal, round, and reactive to light. Conjunctivae and EOM are normal.  Neck: Neck supple. No thyromegaly present.  Cardiovascular: Normal rate, regular rhythm, normal heart sounds and intact distal pulses. Exam reveals no gallop and no friction rub.  No murmur heard. Pulmonary/Chest: Effort normal and breath sounds normal. He has no wheezes. He has no rhonchi. He has no rales.  Abdominal: Soft. Bowel sounds are normal. He exhibits no distension and no mass. There is no tenderness.  Musculoskeletal: Normal range of motion. He exhibits no edema.  Lymphadenopathy:    He has no cervical adenopathy.  Neurological: He is alert and oriented to person, place, and time. He has normal strength and normal reflexes. No cranial nerve deficit. Coordination and gait normal.  Skin: Skin is warm and dry.  Psychiatric: He has a normal mood and affect.  Nursing note and vitals reviewed.   ASSESSMENT and PLAN 1. Encounter for Medicare annual wellness exam Routine HCM labs ordered. HCM reviewed/discussed. Anticipatory guidance regarding healthy weight, lifestyle and choices given.   - Lipid panel -  TSH - CMP14+EGFR  2. Need for prophylactic vaccination and inoculation against influenza - Flu vaccine HIGH DOSE PF (Fluzone High dose)  3. Type 2 diabetes mellitus without complication, without long-term current use of insulin (HCC) - Hemoglobin A1c - sitaGLIPtin (JANUVIA) 100 MG tablet; Take 1 tablet (100 mg total) by mouth daily. - empagliflozin (JARDIANCE) 25 MG TABS tablet; Take 25 mg by mouth daily.  4. Essential hypertension - CBC  5. Dyslipidemia  6. Atherosclerosis of native coronary artery of native heart with stable angina pectoris ([place])    Return in about 6 months (around 02/22/2019).    Rutherford Guys, MD Primary [facility] at [facility] [place], [place] [address] Ph.  [phone] Fax [phone]

## 2018-08-23 NOTE — Patient Instructions (Addendum)
   If you have lab work done today you will be contacted with your lab results within the next 2 weeks.  If you have not heard from us then please contact us. The fastest way to get your results is to register for My Chart.   IF you received an x-ray today, you will receive an invoice from [facility]. Please contact [facility] at [phone] with questions or concerns regarding your invoice.   IF you received labwork today, you will receive an invoice from [facility]. Please contact [facility] at [phone] with questions or concerns regarding your invoice.   Our billing staff will not be able to assist you with questions regarding bills from these companies.  You will be contacted with the lab results as soon as they are available. The fastest way to get your results is to activate your My Chart account. Instructions are located on the last page of this paperwork. If you have not heard from us regarding the results in 2 weeks, please contact this office.     Preventive Care 65 Years and Older, Male Preventive care refers to lifestyle choices and visits with your health care provider that can promote health and wellness. What does preventive care include?  A yearly physical exam. This is also called an annual well check.  Dental exams once or twice a year.  Routine eye exams. Ask your health care provider how often you should have your eyes checked.  Personal lifestyle choices, including: ? Daily care of your teeth and gums. ? Regular physical activity. ? Eating a healthy diet. ? Avoiding tobacco and drug use. ? Limiting alcohol use. ? Practicing safe sex. ? Taking low doses of aspirin every day. ? Taking vitamin and mineral supplements as recommended by your health care provider. What happens during an annual well check? The services and screenings done by your health care provider during your annual well check will depend on your age, overall health,  lifestyle risk factors, and family history of disease. Counseling Your health care provider may ask you questions about your:  Alcohol use.  Tobacco use.  Drug use.  Emotional well-being.  Home and relationship well-being.  Sexual activity.  Eating habits.  History of falls.  Memory and ability to understand (cognition).  Work and work environment.  Screening You may have the following tests or measurements:  Height, weight, and BMI.  Blood pressure.  Lipid and cholesterol levels. These may be checked every 5 years, or more frequently if you are over 50 years old.  Skin check.  Lung cancer screening. You may have this screening every year starting at age 55 if you have a 30-pack-year history of smoking and currently smoke or have quit within the past 15 years.  Fecal occult blood test (FOBT) of the stool. You may have this test every year starting at age 50.  Flexible sigmoidoscopy or colonoscopy. You may have a sigmoidoscopy every 5 years or a colonoscopy every 10 years starting at age 50.  Prostate cancer screening. Recommendations will vary depending on your family history and other risks.  Hepatitis C blood test.  Hepatitis B blood test.  Sexually transmitted disease (STD) testing.  Diabetes screening. This is done by checking your blood sugar (glucose) after you have not eaten for a while (fasting). You may have this done every 1-3 years.  Abdominal aortic aneurysm (AAA) screening. You may need this if you are a current or former smoker.  Osteoporosis. You may be screened   starting at age 87 if you are at high risk.  Talk with your health care provider about your test results, treatment options, and if necessary, the need for more tests. Vaccines Your health care provider may recommend certain vaccines, such as:  Influenza vaccine. This is recommended every year.  Tetanus, diphtheria, and acellular pertussis (Tdap, Td) vaccine. You may need a Td  booster every 10 years.  Varicella vaccine. You may need this if you have not been vaccinated.  Zoster vaccine. You may need this after age 29.  Measles, mumps, and rubella (MMR) vaccine. You may need at least one dose of MMR if you were born in 1957 or later. You may also need a second dose.  Pneumococcal 13-valent conjugate (PCV13) vaccine. One dose is recommended after age 6.  Pneumococcal polysaccharide (PPSV23) vaccine. One dose is recommended after age 51.  Meningococcal vaccine. You may need this if you have certain conditions.  Hepatitis A vaccine. You may need this if you have certain conditions or if you travel or work in places where you may be exposed to hepatitis A.  Hepatitis B vaccine. You may need this if you have certain conditions or if you travel or work in places where you may be exposed to hepatitis B.  Haemophilus influenzae type b (Hib) vaccine. You may need this if you have certain risk factors.  Talk to your health care provider about which screenings and vaccines you need and how often you need them. This information is not intended to replace advice given to you by your health care provider. Make sure you discuss any questions you have with your health care provider. Document Released: 11/29/2015 Document Revised: 07/22/2016 Document Reviewed: 09/03/2015 Elsevier Interactive Patient Education  [employer].

## 2018-08-24 LAB — CMP14+EGFR
ALT: 22 [IU]/L (ref 0–44)
AST: 14 [IU]/L (ref 0–40)
Albumin/Globulin Ratio: 1.9 (ref 1.2–2.2)
Albumin: 4.2 g/dL (ref 3.5–4.8)
Alkaline Phosphatase: 53 [IU]/L (ref 39–117)
BUN/Creatinine Ratio: 27 — ABNORMAL HIGH (ref 10–24)
BUN: 24 mg/dL (ref 8–27)
Bilirubin Total: 0.3 mg/dL (ref 0.0–1.2)
CO2: 24 mmol/L (ref 20–29)
Calcium: 10.8 mg/dL — ABNORMAL HIGH (ref 8.6–10.2)
Chloride: 102 mmol/L (ref 96–106)
Creatinine, Ser: 0.9 mg/dL (ref 0.76–1.27)
GFR calc Af Amer: 98 mL/min/{1.73_m2}
GFR calc non Af Amer: 85 mL/min/{1.73_m2}
Globulin, Total: 2.2 g/dL (ref 1.5–4.5)
Glucose: 146 mg/dL — ABNORMAL HIGH (ref 65–99)
Potassium: 3.3 mmol/L — ABNORMAL LOW (ref 3.5–5.2)
Sodium: 143 mmol/L (ref 134–144)
Total Protein: 6.4 g/dL (ref 6.0–8.5)

## 2018-08-24 LAB — CBC
Hematocrit: 46.7 % (ref 37.5–51.0)
Hemoglobin: 16.1 g/dL (ref 13.0–17.7)
MCH: 32.7 pg (ref 26.6–33.0)
MCHC: 34.5 g/dL (ref 31.5–35.7)
MCV: 95 fL (ref 79–97)
Platelets: 164 10*3/uL (ref 150–450)
RBC: 4.92 x10E6/uL (ref 4.14–5.80)
RDW: 13 % (ref 12.3–15.4)
WBC: 6.1 10*3/uL (ref 3.4–10.8)

## 2018-08-24 LAB — LIPID PANEL
Chol/HDL Ratio: 4.9 ratio (ref 0.0–5.0)
Cholesterol, Total: 162 mg/dL (ref 100–199)
HDL: 33 mg/dL — ABNORMAL LOW (ref >39)
LDL Calculated: 79 mg/dL (ref 0–99)
Triglycerides: 248 mg/dL — ABNORMAL HIGH (ref 0–149)
VLDL Cholesterol Cal: 50 mg/dL — ABNORMAL HIGH (ref 5–40)

## 2018-08-24 LAB — HEMOGLOBIN A1C
Est. average glucose Bld gHb Est-mCnc: 134 mg/dL
Hgb A1c MFr Bld: 6.3 % — ABNORMAL HIGH (ref 4.8–5.6)

## 2018-08-24 LAB — TSH: TSH: 1.34 u[IU]/mL (ref 0.450–4.500)

## 2018-08-27 ENCOUNTER — Other Ambulatory Visit: Payer: Self-pay | Admitting: Family Medicine

## 2018-08-27 DIAGNOSIS — E785 Hyperlipidemia, unspecified: Secondary | ICD-10-CM

## 2018-08-28 ENCOUNTER — Encounter: Payer: Self-pay | Admitting: Family Medicine

## 2018-08-28 MED ORDER — POTASSIUM CHLORIDE ER 10 MEQ PO TBCR: 10.0000 meq | EXTENDED_RELEASE_TABLET | Freq: Two times a day (BID) | ORAL | 0 refills | Status: DC

## 2018-08-28 NOTE — Addendum Note (Signed)
Addended by: Rutherford Guys on: 08/28/2018 11:27 AM   Modules accepted: Orders

## 2018-09-11 ENCOUNTER — Other Ambulatory Visit: Payer: Self-pay | Admitting: Family Medicine

## 2018-09-12 NOTE — Telephone Encounter (Signed)
Requested medication (s) are due for refill today: yes  Requested medication (s) are on the active medication list: yes    Last refill: 05/13/18  #90  0 refills  Future visit scheduled yes 02/27/18  Notes to clinic:  Ca, ionized pending.  Requested Prescriptions  Pending Prescriptions Disp Refills   chlorthalidone (HYGROTON) 25 MG tablet [Pharmacy Med Name: CHLORTHALIDONE 25MG  TABLETS] 90 tablet 0    Sig: TAKE 1 TABLET(25 MG) BY MOUTH DAILY     Cardiovascular: Diuretics - Thiazide Failed - 09/11/2018  1:53 PM      Failed - Ca in normal range and within 360 days    Calcium  Date Value Ref Range Status  08/23/2018 10.8 (H) 8.6 - 10.2 mg/dL Final   Calcium, Total (PTH)  Date Value Ref Range Status  06/15/2012 11.2 (H) 8.4 - 10.5 mg/dL Final         Failed - K in normal range and within 360 days    Potassium  Date Value Ref Range Status  08/23/2018 3.3 (L) 3.5 - 5.2 mmol/L Final         Failed - Last BP in normal range    BP Readings from Last 1 Encounters:  08/23/18 140/74         Passed - Cr in normal range and within 360 days    Creat  Date Value Ref Range Status  08/29/2016 0.93 0.70 - 1.18 mg/dL Final    Comment:      For patients > or = 72 years of age: The upper reference limit for Creatinine is approximately 13% higher for people identified as African-American.      Creatinine, Ser  Date Value Ref Range Status  08/23/2018 0.90 0.76 - 1.27 mg/dL Final         Passed - Na in normal range and within 360 days    Sodium  Date Value Ref Range Status  08/23/2018 143 134 - 144 mmol/L Final         Passed - Valid encounter within last 6 months    Recent Outpatient Visits          2 weeks ago Encounter for Medicare annual wellness exam   Primary Care at [place], Lilia Argue, MD   5 months ago Type 2 diabetes mellitus without complication, without long-term current use of insulin [facility])   Primary Care at [facility], Renette Butters, MD   6 months ago Infected  sebaceous cyst of skin   Primary Care at [facility], [place], [place]   7 months ago Type 2 diabetes mellitus without complication, without long-term current use of insulin [facility])   Primary Care at [facility], Renette Butters, MD   1 year ago Right patellofemoral syndrome   [facility] at [facility], Ranell Patrick, MD      Future Appointments            In 5 months Rutherford Guys, MD Primary Care at [place], [facility]

## 2018-11-02 DIAGNOSIS — H401133 Primary open-angle glaucoma, bilateral, severe stage: Secondary | ICD-10-CM | POA: Diagnosis not present

## 2018-11-02 DIAGNOSIS — H5034 Intermittent alternating exotropia: Secondary | ICD-10-CM | POA: Diagnosis not present

## 2018-11-21 ENCOUNTER — Other Ambulatory Visit: Payer: Self-pay | Admitting: Family Medicine

## 2018-11-21 DIAGNOSIS — E785 Hyperlipidemia, unspecified: Secondary | ICD-10-CM

## 2018-11-22 NOTE — Telephone Encounter (Signed)
Requested Prescriptions  Pending Prescriptions Disp Refills  . rosuvastatin (CRESTOR) 10 MG tablet [Pharmacy Med Name: ROSUVASTATIN 10MG  TABLETS] 90 tablet 0    Sig: TAKE 1 TABLET BY MOUTH EVERY DAY     Cardiovascular:  Antilipid - Statins Failed - 11/21/2018  5:33 PM      Failed - HDL in normal range and within 360 days    HDL  Date Value Ref Range Status  08/23/2018 33 (L) >39 mg/dL Final         Failed - Triglycerides in normal range and within 360 days    Triglycerides  Date Value Ref Range Status  08/23/2018 248 (H) 0 - 149 mg/dL Final         Passed - Total Cholesterol in normal range and within 360 days    Cholesterol, Total  Date Value Ref Range Status  08/23/2018 162 100 - 199 mg/dL Final         Passed - LDL in normal range and within 360 days    LDL Calculated  Date Value Ref Range Status  08/23/2018 79 0 - 99 mg/dL Final         Passed - Patient is not pregnant      Passed - Valid encounter within last 12 months    Recent Outpatient Visits          3 months ago Encounter for [employer] annual wellness exam   Primary Care at [place], Lilia Argue, MD   8 months ago Type 2 diabetes mellitus without complication, without long-term current use of insulin [facility])   Primary Care at [facility], Renette Butters, MD   8 months ago Infected sebaceous cyst of skin   Primary Care at [facility], [place], [place]   9 months ago Type 2 diabetes mellitus without complication, without long-term current use of insulin [facility])   Primary Care at [facility], Renette Butters, MD   1 year ago Right patellofemoral syndrome   [facility] at [facility], Ranell Patrick, MD      Future Appointments            In 3 months Rutherford Guys, MD Primary Care at [place], [facility]

## 2018-11-27 ENCOUNTER — Other Ambulatory Visit: Payer: Self-pay | Admitting: Family Medicine

## 2018-11-28 NOTE — Telephone Encounter (Signed)
Requested medication (s) are due for refill today: yes  Requested medication (s) are on the active medication list: yes  Last refill:  09/12/18 for 90 tabs   Future visit scheduled: yes  Notes to clinic:  Cardiovascular: Diuretic - thiazide failed   Requested Prescriptions  Pending Prescriptions Disp Refills   chlorthalidone (HYGROTON) 25 MG tablet [Pharmacy Med Name: CHLORTHALIDONE 25MG  TABLETS] 90 tablet 0    Sig: TAKE 1 TABLET(25 MG) BY MOUTH DAILY     Cardiovascular: Diuretics - Thiazide Failed - 11/27/2018  6:47 AM      Failed - Ca in normal range and within 360 days    Calcium  Date Value Ref Range Status  08/23/2018 10.8 (H) 8.6 - 10.2 mg/dL Final   Calcium, Total (PTH)  Date Value Ref Range Status  06/15/2012 11.2 (H) 8.4 - 10.5 mg/dL Final         Failed - K in normal range and within 360 days    Potassium  Date Value Ref Range Status  08/23/2018 3.3 (L) 3.5 - 5.2 mmol/L Final         Failed - Last BP in normal range    BP Readings from Last 1 Encounters:  08/23/18 140/74         Passed - Cr in normal range and within 360 days    Creat  Date Value Ref Range Status  08/29/2016 0.93 0.70 - 1.18 mg/dL Final    Comment:      For patients > or = 73 years of age: The upper reference limit for Creatinine is approximately 13% higher for people identified as African-American.      Creatinine, Ser  Date Value Ref Range Status  08/23/2018 0.90 0.76 - 1.27 mg/dL Final         Passed - Na in normal range and within 360 days    Sodium  Date Value Ref Range Status  08/23/2018 143 134 - 144 mmol/L Final         Passed - Valid encounter within last 6 months    Recent Outpatient Visits          3 months ago Encounter for Medicare annual wellness exam   Primary Care at [place], Lilia Argue, MD   8 months ago Type 2 diabetes mellitus without complication, without long-term current use of insulin [facility])   Primary Care at [facility], Renette Butters, MD   8 months  ago Infected sebaceous cyst of skin   Primary Care at [facility], [place], [place]   9 months ago Type 2 diabetes mellitus without complication, without long-term current use of insulin [facility])   Primary Care at [facility], Renette Butters, MD   1 year ago Right patellofemoral syndrome   Primary Care at [facility], Ranell Patrick, MD      Future Appointments            In 3 months Rutherford Guys, MD Primary Care at [place], [facility]

## 2018-12-15 ENCOUNTER — Telehealth: Payer: Self-pay | Admitting: Family Medicine

## 2018-12-15 NOTE — Telephone Encounter (Signed)
Called and spoke with pt. Due to Dr. Pamella Pert being out of the office on 02/28/19, pt was needing to be rescheduled. Pt did not have calendar in front of him so he will cll bak and schedule. When he calls back, plase schedule his cancelled appt from 02/28/19 at the first available. Thank you!

## 2019-01-03 ENCOUNTER — Encounter: Payer: Self-pay | Admitting: Family Medicine

## 2019-01-03 ENCOUNTER — Ambulatory Visit (INDEPENDENT_AMBULATORY_CARE_PROVIDER_SITE_OTHER): Payer: Medicare Other | Admitting: Family Medicine

## 2019-01-03 VITALS — BP 124/70 | HR 63 | Temp 98.0°F | Resp 17 | Ht 71.0 in | Wt 208.0 lb

## 2019-01-03 DIAGNOSIS — R232 Flushing: Secondary | ICD-10-CM | POA: Diagnosis not present

## 2019-01-03 MED ORDER — DICLOFENAC SODIUM 1 % TD GEL: 4.0000 g | Freq: Four times a day (QID) | TRANSDERMAL | 5 refills | Status: DC

## 2019-01-03 NOTE — Patient Instructions (Signed)
° ° ° °  If you have lab work done today you will be contacted with your lab results within the next 2 weeks.  If you have not heard from us then please contact us. The fastest way to get your results is to register for My Chart. ° ° °IF you received an x-ray today, you will receive an invoice from [facility]. Please contact [facility] at [phone] with questions or concerns regarding your invoice.  ° °IF you received labwork today, you will receive an invoice from [facility]. Please contact [facility] at [phone] with questions or concerns regarding your invoice.  ° °Our billing staff will not be able to assist you with questions regarding bills from these companies. ° °You will be contacted with the lab results as soon as they are available. The fastest way to get your results is to activate your My Chart account. Instructions are located on the last page of this paperwork. If you have not heard from us regarding the results in 2 weeks, please contact this office. °  ° ° ° °

## 2019-01-03 NOTE — Progress Notes (Signed)
2/18/20203:16 PM  [name] 04-23-1946, 73 y.o. male [id number]  Chief Complaint  Patient presents with  . face flushing    HPI:   Patient is a 73 y.o. male with past medical history significant for DM2, migraines, CAD, HTN, OSA, HLP, DDD lumbar spine  who presents today for redness of face  Started about 2.5 weeks ago when he had a sudden spike in his BP Facial redness starts in the afternoon and goes on until he goes to bed Wakes up clear No recent changes in diet Face feels hot, tingles No new medications, supplements No recent antibiotics Morning meds: ARDS, fish oil, januvia, metoprolol, chlorthalidone, rouvastatin He has chronic alternating diarrhea and constipation, no worsening diarrhea  occ has morning headaches depending on BP reading Denies any recent diarrhea, no diaphoresis No palpitations Other than recent spike in BP no significant swings  Fall Risk  01/03/2019 08/23/2018 08/23/2018 03/26/2018 03/09/2018  Falls in the past year? 1 No No No No  Number falls in past yr: 0 - - - -  Injury with Fall? 0 - - - -  Risk for fall due to : - - - - -  Follow up - - - - -     Depression screen [facility] 2/9 01/03/2019 08/23/2018 08/23/2018  Decreased Interest 0 0 0  Down, Depressed, Hopeless 0 0 0  PHQ - 2 Score 0 0 0  Altered sleeping 0 - -  Tired, decreased energy 0 - -  Change in appetite 0 - -  Feeling bad or failure about yourself  0 - -  Trouble concentrating 0 - -  Moving slowly or fidgety/restless 0 - -  Suicidal thoughts 0 - -  PHQ-9 Score 0 - -  Difficult doing work/chores Not difficult at all - -    Allergies  Allergen Reactions  . Amaryl Other (See Comments)    Gives pt migraines  . Other Other (See Comments)    Pistachios, lima beans, and some types of chocolate cause migraines  Nasal corticosteroids causes nose bleeds  . Skelaxin [Metaxalone] Dermatitis and Other (See Comments)    Cherry red rash with sloughing off of skin at left upper  chest/shoulder  . Stool Softener [Dss] Other (See Comments)    Causes migraines  . Norco [Hydrocodone-Acetaminophen] Other (See Comments)    Ineffective  . Pistachio Nut (Diagnostic) Other (See Comments)     In  Addition :Lima beans, milk chocolate   . Doc-Q-Lax [Digital Fever Thermometer] Other (See Comments)    Headache   . Levofloxacin Nausea Only  . Lisinopril Cough  . Metformin And Related Diarrhea    Prior to Admission medications   Medication Sig Start Date End Date Taking? Authorizing Provider  acetaminophen (TYLENOL) 650 MG CR tablet Take 650 mg by mouth 5 (five) times daily.    Yes [provider]  Alum Hydroxide-Mag Trisilicate (GAVISCON) 01-65.5 MG CHEW Chew 1 tablet by mouth daily as needed (for acid reflux).   Yes [provider]  amLODipine (NORVASC) 10 MG tablet Take 0.5 tablets (5 mg total) by mouth daily. 08/18/18  Yes Rutherford Guys, MD  aspirin 81 MG tablet Take 81 mg by mouth every evening.    Yes [provider]  bimatoprost (LUMIGAN) 0.01 % SOLN Place 1 drop into both eyes every evening.   Yes [provider]  brimonidine-timolol (COMBIGAN) 0.2-0.5 % ophthalmic solution Place 1 drop into both eyes 2 (two) times daily.   Yes [provider]  brinzolamide (AZOPT) 1 % ophthalmic suspension Place 1 drop into both eyes 2 (two) times daily.    Yes [provider]  chlorthalidone (HYGROTON) 25 MG tablet TAKE 1 TABLET(25 MG) BY MOUTH DAILY 12/01/18  Yes Rutherford Guys, MD  Coenzyme Q10 (COQ-10) 200 MG CAPS Take 200 mg by mouth daily.   Yes [provider]  diclofenac sodium (VOLTAREN) 1 % GEL Apply 4 g topically 4 (four) times daily. Patient taking differently: Apply 4 g topically daily.  03/26/18  Yes Wardell Honour, MD  empagliflozin (JARDIANCE) 25 MG TABS tablet Take 25 mg by mouth daily. 08/23/18  Yes Rutherford Guys, MD  FREESTYLE LITE test strip USE TO TEST BLOOD SUGAR ONCE DAILY 07/20/18  Yes Delia Chimes A, MD  ketoconazole (NIZORAL) 2 % cream Apply topically 2 (two) times daily. 08/18/18  Yes Rutherford Guys, MD  metoprolol tartrate (LOPRESSOR) 25 MG tablet Take 25 mg by mouth 2 (two) times daily.   Yes [provider]  Multiple Vitamins-Minerals (PRESERVISION AREDS 2) CAPS Take 1 capsule by mouth 2 (two) times daily.   Yes [provider]  naproxen sodium (ALEVE) 220 MG tablet Take 220 mg by mouth at bedtime.   Yes [provider]  Omega-3 Fatty Acids (FISH OIL) 1200 MG CAPS Take 1,200 mg by mouth 2 (two) times daily.   Yes [provider]  OVER THE COUNTER MEDICATION Take 1,000 mg by mouth daily. Microlactin 1000 mg Supplement   Yes [provider]  OVER THE COUNTER MEDICATION Take 1 tablet by mouth 2 (two) times daily. Turmeric-Ginger Supplement   Yes [provider]  OVER THE COUNTER MEDICATION Take 2-3 drops by mouth See admin instructions. CBD Hemp Oil - Place 3 drops in mouth in the morning, place 2 drops in mouth at lunch and dinner, and place 3 drops in mouth at bedtime.   Yes [provider]  potassium chloride (K-DUR) 10 MEQ tablet TAKE 1 TABLET(10 MEQ) BY MOUTH TWICE DAILY 11/28/18  Yes Rutherford Guys, MD  rosuvastatin (CRESTOR) 10 MG tablet TAKE 1 TABLET BY MOUTH EVERY DAY 11/22/18  Yes Rutherford Guys, MD  sitaGLIPtin (JANUVIA) 100 MG tablet Take 1 tablet (100 mg total) by mouth daily. 08/23/18  Yes Rutherford Guys, MD  SUMAtriptan (IMITREX) 50 MG tablet TAKE 1 TABLET BY MOUTH EVERY 2 HOURS AS NEEDED FOR MIGRAINE Patient taking differently: Take 50 mg by mouth every 2 (two) hours as needed for migraine.  03/05/17  Yes Wardell Honour, MD  traMADol (ULTRAM-ER) 100 MG 24 hr tablet Take 100 mg by mouth daily as needed for pain.   Yes [provider]    Past Medical History:  Diagnosis Date  . Arthritis    DDD, scoliosis, stenosis    . 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 Calcium blood increased   . Cancer (HCC)    skin ca, posterior R ear   (BENIGN)  . Complication of anesthesia    difficulty intubation , anterior larynx, easy mask ventilation  . Coronary artery disease   . Diabetes mellitus without complication ([place])   . Difficult intubation   . Diverticulitis   . Glaucoma   . Headache(784.0)    migraines  . History of kidney stones   . Hyperlipemia   . Hypertension   . Sleep apnea    CPAP still in use q night, & for naps thru  out the day, pt. reports that last study was neg for sleep apnea but he still uses the device    . Spinal stenosis   . Ventricular dilatation    Right    Past Surgical History:  Procedure Laterality Date  . APPENDECTOMY  1991  . BOWEL RESECTION  1985  . CARDIAC CATHETERIZATION  2006  . CATARACT EXTRACTION Right   . CHOLECYSTECTOMY  1990  . COLON SURGERY     bowel resection, for diverticulitis   . COLONOSCOPY N/A 06/15/2013   Procedure: COLONOSCOPY;  Surgeon: Beryle Beams, MD;  Location: [facility];  Service: Endoscopy;  Laterality: N/A;  . COLONOSCOPY WITH PROPOFOL N/A 07/01/2018   Procedure: COLONOSCOPY WITH PROPOFOL;  Surgeon: Carol Ada, MD;  Location: [facility];  Service: Endoscopy;  Laterality: N/A;  . CORONARY ANGIOPLASTY     STENT PLACED  . LAMINECTOMY WITH POSTERIOR LATERAL ARTHRODESIS LEVEL 1 N/A 04/09/2017   Procedure: Lumbar TwoThree,Lumbar Three-Four,Lumbar Four-Five Laminectomy with Lumbar Five-Sacral One Posterior lateral fusion;  Surgeon: Eustace Moore, MD;  Location: [name];  Service: Neurosurgery;  Laterality: N/A;  . LITHOTRIPSY    . POLYPECTOMY  07/01/2018   Procedure: POLYPECTOMY;  Surgeon: Carol Ada, MD;  Location: [facility];  Service: Endoscopy;;  . SPINAL CORD STIMULATOR INSERTION N/A 03/20/2016   Procedure: LUMBAR SPINAL CORD STIMULATOR INSERTION;  Surgeon: Clydell Hakim, MD;  Location: [facility];  Service: Neurosurgery;  Laterality: N/A;  . TONSILLECTOMY    . [place] ?    Social History   Tobacco Use  . Smoking status: Never Smoker  . Smokeless tobacco: Never Used  Substance Use Topics  . Alcohol use: Yes    Alcohol/week: 0.0 standard drinks    Comment: seldom     Family History  Problem Relation Age of Onset  . Heart disease Father   . Heart attack Father   . Heart disease Paternal Grandmother     Review of Systems  Constitutional: Negative for chills, diaphoresis, fever, malaise/fatigue and weight loss.  Respiratory: Negative for cough and shortness of breath.   Cardiovascular: Negative for chest pain, palpitations and leg swelling.  Gastrointestinal: Positive for constipation and diarrhea. Negative for abdominal pain, nausea and vomiting.  Genitourinary: Negative for dysuria and hematuria.  Skin: Positive for rash.  Neurological: Positive for tingling and headaches. Negative for tremors.     OBJECTIVE:  Blood pressure 124/70, pulse 63, temperature 98 F (36.7 C), temperature source Oral, resp. rate 17, height 5\' 11"  (1.803 m), weight 208 lb (94.3 kg), SpO2 98 %. Body mass index is 29.01 kg/m.   Physical Exam Vitals signs and nursing note reviewed.  Constitutional:      Appearance: He is well-developed.  HENT:     Head: Normocephalic and atraumatic.  Eyes:     Conjunctiva/sclera: Conjunctivae normal.     Pupils: Pupils are equal, round, and reactive to light.  Neck:     Musculoskeletal: Neck supple.  Cardiovascular:     Rate and Rhythm: Normal rate and regular rhythm.     Heart sounds: No murmur. No friction rub. No gallop.   Pulmonary:     Effort: Pulmonary effort is normal.     Breath sounds: Normal breath sounds. No wheezing or rales.  Abdominal:     General: Bowel sounds are normal. There is no distension.     Palpations: Abdomen is soft. There is no mass.     Tenderness: There is  no abdominal tenderness.  Skin:    General: Skin is warm and dry.     Findings: Erythema (facial erythema on  forehead and cheecks, cheecks with telenagectasia, no sign involvement of nose, nasal folds or chin) present.  Neurological:     Mental Status: He is alert and oriented to person, place, and time.     ASSESSMENT and PLAN  1. Facial flushing Reviewed medications will patient, given recent spike in BP a/w flushing will check urine cathecholamines/metanephrines to eval for pheo - CBC with Differential/Platelet - Comprehensive metabolic panel - Metanephrines, urine, 24 hour; Future - Catecholamines, fractionated, urine, 24 hour; Future  Other orders - diclofenac sodium (VOLTAREN) 1 % GEL; Apply 4 g topically 4 (four) times daily.  Return for as scheduled.    Rutherford Guys, MD Primary Care at [place],  [address] Ph.  [phone] Fax [phone]

## 2019-01-04 LAB — CBC WITH DIFFERENTIAL/PLATELET
Basophils Absolute: 0 10*3/uL (ref 0.0–0.2)
Basos: 1 %
EOS (ABSOLUTE): 0.1 10*3/uL (ref 0.0–0.4)
Eos: 1 %
Hematocrit: 47.8 % (ref 37.5–51.0)
Hemoglobin: 16.2 g/dL (ref 13.0–17.7)
Immature Grans (Abs): 0 10*3/uL (ref 0.0–0.1)
Immature Granulocytes: 0 %
Lymphocytes Absolute: 1.5 10*3/uL (ref 0.7–3.1)
Lymphs: 25 %
MCH: 32.1 pg (ref 26.6–33.0)
MCHC: 33.9 g/dL (ref 31.5–35.7)
MCV: 95 fL (ref 79–97)
Monocytes Absolute: 0.6 10*3/uL (ref 0.1–0.9)
Monocytes: 10 %
Neutrophils Absolute: 3.6 10*3/uL (ref 1.4–7.0)
Neutrophils: 63 %
Platelets: 194 10*3/uL (ref 150–450)
RBC: 5.05 x10E6/uL (ref 4.14–5.80)
RDW: 12.9 % (ref 11.6–15.4)
WBC: 5.8 10*3/uL (ref 3.4–10.8)

## 2019-01-04 LAB — COMPREHENSIVE METABOLIC PANEL
ALT: 18 IU/L (ref 0–44)
AST: 14 IU/L (ref 0–40)
Albumin/Globulin Ratio: 2.1 (ref 1.2–2.2)
Albumin: 4.6 g/dL (ref 3.7–4.7)
Alkaline Phosphatase: 65 IU/L (ref 39–117)
BUN/Creatinine Ratio: 19 (ref 10–24)
BUN: 19 mg/dL (ref 8–27)
Bilirubin Total: 0.3 mg/dL (ref 0.0–1.2)
CO2: 26 mmol/L (ref 20–29)
Calcium: 11.3 mg/dL — ABNORMAL HIGH (ref 8.6–10.2)
Chloride: 103 mmol/L (ref 96–106)
Creatinine, Ser: 0.99 mg/dL (ref 0.76–1.27)
GFR calc Af Amer: 88 mL/min/{1.73_m2} (ref >59)
GFR calc non Af Amer: 76 mL/min/{1.73_m2} (ref >59)
Globulin, Total: 2.2 g/dL (ref 1.5–4.5)
Glucose: 141 mg/dL — ABNORMAL HIGH (ref 65–99)
Potassium: 3.6 mmol/L (ref 3.5–5.2)
Sodium: 147 mmol/L — ABNORMAL HIGH (ref 134–144)
Total Protein: 6.8 g/dL (ref 6.0–8.5)

## 2019-01-09 ENCOUNTER — Encounter: Payer: Self-pay | Admitting: Cardiology

## 2019-01-09 ENCOUNTER — Ambulatory Visit (INDEPENDENT_AMBULATORY_CARE_PROVIDER_SITE_OTHER): Payer: Medicare Other | Admitting: Cardiology

## 2019-01-09 VITALS — BP 112/67 | HR 62 | Ht 72.0 in | Wt 207.1 lb

## 2019-01-09 DIAGNOSIS — I517 Cardiomegaly: Secondary | ICD-10-CM

## 2019-01-09 DIAGNOSIS — G4733 Obstructive sleep apnea (adult) (pediatric): Secondary | ICD-10-CM

## 2019-01-09 DIAGNOSIS — Z0189 Encounter for other specified special examinations: Secondary | ICD-10-CM

## 2019-01-09 DIAGNOSIS — E782 Mixed hyperlipidemia: Secondary | ICD-10-CM

## 2019-01-09 DIAGNOSIS — E119 Type 2 diabetes mellitus without complications: Secondary | ICD-10-CM

## 2019-01-09 DIAGNOSIS — I10 Essential (primary) hypertension: Secondary | ICD-10-CM

## 2019-01-09 DIAGNOSIS — I25118 Atherosclerotic heart disease of native coronary artery with other forms of angina pectoris: Secondary | ICD-10-CM | POA: Diagnosis not present

## 2019-01-09 DIAGNOSIS — R232 Flushing: Secondary | ICD-10-CM | POA: Diagnosis not present

## 2019-01-09 HISTORY — DX: Cardiomegaly: I51.7

## 2019-01-09 MED ORDER — NITROGLYCERIN 0.4 MG SL SUBL: 0.4000 mg | SUBLINGUAL_TABLET | SUBLINGUAL | 3 refills | Status: DC | PRN

## 2019-01-09 MED ORDER — OMEGA-3-ACID ETHYL ESTERS 1 G PO CAPS: 2.0000 g | ORAL_CAPSULE | Freq: Two times a day (BID) | ORAL | 3 refills | Status: DC

## 2019-01-09 NOTE — Addendum Note (Signed)
Addended by: Ileana Roup on: 01/09/2019 04:38 PM   Modules accepted: Orders

## 2019-01-09 NOTE — Progress Notes (Signed)
Subjective:  Primary Physician:  Rutherford Guys, MD  Patient ID: [name], male    DOB: May 16, 1946, 73 y.o.   MRN: [id number]  Chief Complaint  Patient presents with  . Coronary Artery Disease  . Follow-up    HPI: [name]  is a 73 y.o. male  with coronary artery disease and has undergone PCI to the RCA on 07/23/2005 with implantation of a 2.5 x 18 Cypher drug-eluting stent.  He has moderate disease in his proximal LAD.  He also has history of hypertension, hyperlipidemia and diabetes mellitus and obstructive sleep apnea on CPAP and compliant and follows Dr. Rexene Alberts.    He has history of spinal stenosis S/P Laminectomy 03/20/2016 that has not completely resolve his back pain. Hs rare  CP lasting 2-3 minutes and did not a have have to use NTG. No dyspnea, orthopnea or palpitations.    He is diabetic and diabetes is well controlled.  No ulcerations in his feet, no bluish discoloration of his feet.  No TIA, palpitations, dizziness or syncope. C/O Back pain and knee pain due to arthritis.  He is presently doing well, still continues to have occasional episodes of exertional chest discomfort but states that it is very mild.  He also has chronic mild dyspnea.  No PND or orthopnea.  No leg edema.  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  Past Medical History:  Diagnosis Date  . Arthritis    DDD, scoliosis, stenosis    . Atherosclerotic heart disease native coronary artery w/angina pectoris ([place])   . Calcium blood increased   . Cancer (HCC)    skin ca, posterior R ear   (BENIGN)  . Complication of anesthesia    difficulty intubation , anterior larynx, easy mask ventilation  . Coronary artery disease   . Diabetes mellitus without complication ([place])   . Difficult intubation   . Diverticulitis   . Glaucoma   . Headache(784.0)    migraines  . History of kidney stones   . Hyperlipemia   . Hypertension   . Right ventricular dilation,  secondary 01/09/2019  . Sleep apnea    CPAP still in use q night, & for naps thru out the day, pt. reports that last study was neg for sleep apnea but he still uses the device    . Spinal stenosis   . Ventricular dilatation    Right    Past Surgical History:  Procedure Laterality Date  . APPENDECTOMY  1991  . BOWEL RESECTION  1985  . CARDIAC CATHETERIZATION  2006  . CATARACT EXTRACTION Right   . CHOLECYSTECTOMY  1990  . COLON SURGERY     bowel resection, for diverticulitis   . COLONOSCOPY N/A 06/15/2013   Procedure: COLONOSCOPY;  Surgeon: Beryle Beams, MD;  Location: [facility];  Service: Endoscopy;  Laterality: N/A;  . COLONOSCOPY WITH PROPOFOL N/A 07/01/2018   Procedure: COLONOSCOPY WITH PROPOFOL;  Surgeon: Carol Ada, MD;  Location: [facility];  Service: Endoscopy;  Laterality: N/A;  . CORONARY ANGIOPLASTY     STENT PLACED  . LAMINECTOMY WITH POSTERIOR LATERAL ARTHRODESIS LEVEL 1 N/A 04/09/2017   Procedure: Lumbar TwoThree,Lumbar Three-Four,Lumbar Four-Five Laminectomy with Lumbar Five-Sacral One Posterior lateral fusion;  Surgeon: Eustace Moore, MD;  Location: [facility];  Service: Neurosurgery;  Laterality: N/A;  . LITHOTRIPSY    . POLYPECTOMY  07/01/2018   Procedure: POLYPECTOMY;  Surgeon: Carol Ada, MD;  Location:  [facility];  Service: Endoscopy;;  . SPINAL CORD STIMULATOR INSERTION N/A 03/20/2016   Procedure: LUMBAR SPINAL CORD STIMULATOR INSERTION;  Surgeon: Clydell Hakim, MD;  Location: [facility];  Service: Neurosurgery;  Laterality: N/A;  . TONSILLECTOMY    . [place] ?    Social History   Socioeconomic History  . Marital status: Married    Spouse name: Not on file  . Number of children: 0  . Years of education: Masters +  . Highest education level: Not on file  Occupational History  . Occupation: Retired  Scientific laboratory technician  . Financial resource strain: Not on file  . Food insecurity:    Worry: Not on file    Inability: Not on file  .  Transportation needs:    Medical: Not on file    Non-medical: Not on file  Tobacco Use  . Smoking status: Never Smoker  . Smokeless tobacco: Never Used  Substance and Sexual Activity  . Alcohol use: Yes    Alcohol/week: 0.0 standard drinks    Comment: seldom   . Drug use: No  . Sexual activity: Yes  Lifestyle  . Physical activity:    Days per week: Not on file    Minutes per session: Not on file  . Stress: Not on file  Relationships  . Social connections:    Talks on phone: Not on file    Gets together: Not on file    Attends religious service: Not on file    Active member of club or organization: Not on file    Attends meetings of clubs or organizations: Not on file    Relationship status: Not on file  . Intimate partner violence:    Fear of current or ex partner: Not on file    Emotionally abused: Not on file    Physically abused: Not on file    Forced sexual activity: Not on file  Other Topics Concern  . Not on file  Social History Narrative   Marital status: married x 1969      Children: none      Lives: with wife, 1 dog      Employment: retired at age 74.  Education music      Tobacco: none      Alcohol: rare      Exercise: stationary bike in 2018; rare use in 2018 due to sick dog.      ADLs: independent with ADLs; drives      Advanced Directives:    2 cups of coffee a day, soda or tea about 3 times a week.     Current Outpatient Medications on File Prior to Visit  Medication Sig Dispense Refill  . acetaminophen (TYLENOL) 650 MG CR tablet Take 650 mg by mouth 5 (five) times daily.     Marland Kitchen amLODipine (NORVASC) 10 MG tablet Take 0.5 tablets (5 mg total) by mouth daily. (Patient taking differently: Take 10 mg by mouth daily. ) 90 tablet 0  . aspirin 81 MG tablet Take 81 mg by mouth every evening.     . bimatoprost (LUMIGAN) 0.01 % SOLN Place 1 drop into both eyes every evening.    . brimonidine-timolol (COMBIGAN) 0.2-0.5 % ophthalmic solution Place 1 drop into both  eyes 2 (two) times daily.    . brinzolamide (AZOPT) 1 % ophthalmic suspension Place 1 drop into both eyes 2 (two) times daily.     . chlorthalidone (HYGROTON) 25 MG tablet TAKE  1 TABLET(25 MG) BY MOUTH DAILY 90 tablet 0  . diclofenac sodium (VOLTAREN) 1 % GEL Apply 4 g topically 4 (four) times daily. 100 g 5  . empagliflozin (JARDIANCE) 25 MG TABS tablet Take 25 mg by mouth daily. 90 tablet 3  . FREESTYLE LITE test strip USE TO TEST BLOOD SUGAR ONCE DAILY 100 each 11  . ketoconazole (NIZORAL) 2 % cream Apply topically 2 (two) times daily. (Patient taking differently: Apply topically as needed. ) 60 g 0  . Magnesium 125 MG CAPS Take by mouth. Every 2-3 days    . metoprolol tartrate (LOPRESSOR) 25 MG tablet Take 25 mg by mouth 2 (two) times daily.    . naproxen sodium (ALEVE) 220 MG tablet Take 220 mg by mouth at bedtime.    . Omega-3 Fatty Acids (FISH OIL) 1200 MG CAPS Take 1,200 mg by mouth 2 (two) times daily.    Marland Kitchen OVER THE COUNTER MEDICATION Take 1,000 mg by mouth daily. Microlactin 1000 mg Supplement    . OVER THE COUNTER MEDICATION Take 2-3 drops by mouth See admin instructions. CBD Hemp Oil - Place 3 drops in mouth in the morning, place 2 drops in mouth at lunch and dinner, and place 3 drops in mouth at bedtime.    . potassium chloride (K-DUR) 10 MEQ tablet TAKE 1 TABLET(10 MEQ) BY MOUTH TWICE DAILY 180 tablet 0  . rosuvastatin (CRESTOR) 10 MG tablet TAKE 1 TABLET BY MOUTH EVERY DAY 90 tablet 0  . sitaGLIPtin (JANUVIA) 100 MG tablet Take 1 tablet (100 mg total) by mouth daily. 90 tablet 3  . SUMAtriptan (IMITREX) 50 MG tablet TAKE 1 TABLET BY MOUTH EVERY 2 HOURS AS NEEDED FOR MIGRAINE (Patient taking differently: Take 50 mg by mouth every 2 (two) hours as needed for migraine. ) 9 tablet 3  . Alum Hydroxide-Mag Trisilicate (GAVISCON) 72-53.6 MG CHEW Chew 1 tablet by mouth daily as needed (for acid reflux).    . Multiple Vitamins-Minerals (PRESERVISION AREDS 2) CAPS Take 1 capsule by mouth 2  (two) times daily.    . traMADol (ULTRAM-ER) 100 MG 24 hr tablet Take 100 mg by mouth daily as needed for pain.     No current facility-administered medications on file prior to visit.      Review of Systems  Constitutional: Negative for malaise/fatigue and weight loss.  Respiratory: Positive for shortness of breath. Negative for cough and hemoptysis.   Cardiovascular: Positive for chest pain. Negative for palpitations, claudication and leg swelling.  Gastrointestinal: Negative for abdominal pain, blood in stool, constipation, heartburn and vomiting.  Genitourinary: Negative for dysuria.  Musculoskeletal: Positive for back pain and joint pain. Negative for myalgias.  Neurological: Negative for dizziness, focal weakness and headaches.  Endo/Heme/Allergies: Does not bruise/bleed easily.  Psychiatric/Behavioral: Negative for depression. The patient is not nervous/anxious.   All other systems reviewed and are negative.      Objective:  Blood pressure 112/67, pulse 62, height 6' (1.829 m), weight 207 lb 1.6 oz (93.9 kg), SpO2 97 %. Body mass index is 28.09 kg/m.  Physical Exam  Constitutional: No distress.  Eyes: Conjunctivae are normal.  Neck: Neck supple. No JVD present.  Cardiovascular: Regular rhythm, normal heart sounds, intact distal pulses and normal pulses. Exam reveals no gallop.  No murmur heard. Pulmonary/Chest: Effort normal and breath sounds normal. He exhibits no tenderness.  Abdominal: Soft. Bowel sounds are normal.  Musculoskeletal: Normal range of motion.  Neurological: He is alert and oriented to person, place, and  time.  Skin: Skin is warm.    CARDIAC STUDIES:   Abdominal Aortic Duplex [08/13/2015]: : Diffuse plaque noted in the proximal aorta. Focal plaque noted in the mid and distal aorta. mild ectasia noted in the abdominal aorta. No AAA observed. No significant change from 01/24/15, previously noted AAA is probably ectasia.  Echocardiogram 01/11/2018: Left  ventricle cavity is normal in size. Mild concentric hypertrophy of the left ventricle.  Normal global wall motion. Doppler evidence of grade I (impaired) diastolic dysfunction, normal LAP. Calculated EF 58%. Left atrial cavity is mildly dilated. Mild (Grade I) mitral regurgitation. Mild chordal SAM. Mild to moderate tricuspid regurgitation. Estimated pulmonary artery systolic pressure 29  mmHg. Mild pulmonic regurgitation. Compared to prior study in 2015, RV function has improved, PASP has decreased.   Exercise Myoview stress test 11/27/2013: 1. Resting EKG showed normal sinus rhythm, poor R wave progression, non-specific ST-depression in inferior leads, cannot R/O ischemia. Stress EKG was equivocal for ischemia. There was 1.5 mm additional up-sloping ST depression noted at peak exercise, which resolved at > 2 minutes into recovery. Patient exercised on BRUCE PROTOCOL for 7 minutes 1 seconds. The maximum work level achieved was 7.6 MET's. The test was terminated due to achievement of the target heart rate. 2. Perfusion imaging study demonstrated no evidence of ischemia. The right ventricle was prominent and suggests RV dilatation/hypertrophy. Dynamic gated images reveal normal wall motion and endocardial thickening. Left ventricular ejection fraction was estimated to be 73%. This is a low risk study.  Assessment & Recommendations:   1. Atherosclerosis of native coronary artery of native heart with stable angina pectoris ([place]) 07/23/2005 PTCA/Stent Mid RCA 2.5 x 18 Cypher drug-eluting stent. Moderate disease in proximal LAD.  EKG 01/09/2019: Normal sinus rhythm at the rate of 60 bpm, left atrial abnormality, normal axis, poor R-wave progression, cannot exclude anteroseptal infarct old.  ST depression with T wave inversion in the inferior leads, cannot exclude ischemia.  Incomplete right bundle branch block.   - EKG 12-Lead  2. Essential hypertension  3. Obstructive sleep apnea  4. Type 2  diabetes mellitus without complication, without long-term current use of insulin ([place])  5. Mixed hyperlipidemia  6. Laboratory examination Lipid Panel     Component Value Date/Time   CHOL 162 08/23/2018 1728   TRIG 248 (H) 08/23/2018 1728   HDL 33 (L) 08/23/2018 1728   CHOLHDL 4.9 08/23/2018 1728   CHOLHDL 3.8 06/03/2016 1201   VLDL 27 06/03/2016 1201   LDLCALC 79 08/23/2018 1728   LDLDIRECT 68.5 09/27/2008 1008    Recommendation:  Patient is here for annual visit and follow-up of coronary artery disease and hyperlipidemia, I have reviewed the recently performed labs, CMP, CBC normal limits except for elevated serum glucose however his diabetes is not well controlled.  Due to elevated triglycerides I discontinued over the counter fish oil capsules and switched him to Lovaza.  I would like to obtain lipid profile testing in 2 months.   S/L NTG was prescribed and explained how to and when to use it and to notify us if there is change in frequency of use. Interaction with cialis-like agents (if applicable was discussed).   Blood pressure is well controlled, dyspnea has remained stable.  He does have an abnormal EKG however there is no change compared to prior EKG.  Adrian Prows, MD, [facility] 01/09/2019, [facility]. [place] Pager: [phone] Office: [phone] If no answer Cell [phone]

## 2019-01-10 ENCOUNTER — Telehealth: Payer: Self-pay

## 2019-01-10 NOTE — Telephone Encounter (Signed)
Pt called c/o the fish oil you sent in was too expensive about 300$ if you can recommend one for OTC that he can take that may not be as expensive

## 2019-01-10 NOTE — Telephone Encounter (Signed)
Can we talk to the insurance people. Otherwise we can try Vascepa brand name and see if we get prior authorization. Indication mixed hyperlipidemia

## 2019-01-11 NOTE — Addendum Note (Signed)
Addended by: Rutherford Guys on: 01/11/2019 01:36 PM   Modules accepted: Orders

## 2019-01-12 ENCOUNTER — Encounter: Payer: Self-pay | Admitting: Family Medicine

## 2019-01-12 LAB — METANEPHRINES, URINE, 24 HOUR
Metaneph Total, Ur: 72 ug/L
Metanephrines, 24H Ur: 158 ug/24 hr (ref 45–290)
Normetanephrine, 24H Ur: 249 ug/24 hr (ref 82–500)
Normetanephrine, Ur: 113 ug/L

## 2019-01-13 LAB — CATECHOLAMINES, FRACTIONATED, URINE, 24 HOUR
Dopamine , 24H Ur: 255 ug/24 hr (ref 0–510)
Dopamine, Rand Ur: 88 ug/L
Epinephrine, 24H Ur: 3 ug/24 hr (ref 0–20)
Epinephrine, Rand Ur: 1 ug/L
Norepinephrine, 24H Ur: 46 ug/24 hr (ref 0–135)
Norepinephrine, Rand Ur: 16 ug/L

## 2019-01-18 ENCOUNTER — Other Ambulatory Visit: Payer: Self-pay | Admitting: Family Medicine

## 2019-01-18 NOTE — Telephone Encounter (Signed)
Requested Prescriptions  Pending Prescriptions Disp Refills  . amLODipine (NORVASC) 10 MG tablet [Pharmacy Med Name: AMLODIPINE BESYLATE 10MG  TABLETS] 90 tablet 0    Sig: TAKE 1/2 TABLET BY MOUTH DAILY     Cardiovascular:  Calcium Channel Blockers Passed - 01/18/2019 10:24 AM      Passed - Last BP in normal range    BP Readings from Last 1 Encounters:  01/09/19 112/67         Passed - Valid encounter within last 6 months    Recent Outpatient Visits          2 weeks ago Facial flushing   [facility], Lilia Argue, MD   4 months ago Encounter for [employer] annual wellness exam   [facility], Lilia Argue, MD   9 months ago Type 2 diabetes mellitus without complication, without long-term current use of insulin [facility])   [facility] at [facility], Renette Butters, MD   10 months ago Infected sebaceous cyst of skin   Primary Care at [facility], [place], [place]   11 months ago Type 2 diabetes mellitus without complication, without long-term current use of insulin [facility])   [facility] at [facility], Renette Butters, MD      Future Appointments            In 1 month Rutherford Guys, MD [facility] at [place], [facility]   In 11 months Adrian Prows, MD [facility]

## 2019-01-23 ENCOUNTER — Ambulatory Visit: Payer: Medicare Other | Admitting: Family Medicine

## 2019-01-23 ENCOUNTER — Telehealth: Payer: Self-pay | Admitting: Family Medicine

## 2019-01-23 ENCOUNTER — Other Ambulatory Visit: Payer: Self-pay | Admitting: Cardiology

## 2019-01-23 ENCOUNTER — Telehealth: Payer: Self-pay

## 2019-01-23 DIAGNOSIS — E782 Mixed hyperlipidemia: Secondary | ICD-10-CM | POA: Diagnosis not present

## 2019-01-23 NOTE — Telephone Encounter (Signed)
Spoke with patient He will come in today for labs and BP check Reports BP this morning off HCTZ 105/72 Informed patient to hold off HCTZ at this point

## 2019-01-23 NOTE — Telephone Encounter (Signed)
Copied from [place] [id number]. Topic: Quick Communication - See Telephone Encounter >> Jan 23, 2019 10:07 AM Reyne Dumas L wrote: CRM for notification. See Telephone encounter for: 01/23/19.  Pt states that Dr. Pamella Pert was aware that he was having bloodwork done by Dr. Sherrie Mustache.  Told him to stop taking chlorthalidone (HYGROTON) 25 MG tablet because she wanted to see if that was having an effect on his calcium blood levels.  Apparently Dr. Sloan Leiter didn't let Dr. Sherrie Mustache know because they only did a lipid panel which doesn't include calcium.  Pt wants to know if he needs to come in today or tomorrow to have blood tests done at [facility] because he has been off of this medication for the 4 days prior to lab work like requested.  Pt needs to know does he  1.  need to come in to have blood work done? 2.  resume medication? Pt can be reached at [phone]

## 2019-01-23 NOTE — Telephone Encounter (Signed)
Pt to office for lab work.  Wanted to know if labs will be covered by insurance.  [facility] ran order and dx through their system.  No ABN generated.  Advised pt that test isn't being flagged as NOT covered but that it is not a guarantee that it will be 100% covered.  Pt requested appt with Dr. Pamella Pert today or tomorrow stating that he never has a problem with labs being covered when he has an appt.  No appts available on either day.  Pt states someone in our office is in touch with Medicare every day and that it is our problem to deal with.  Offered [facility] Billing and dx code on order.  Pt states he will not be getting lab work done at this time.

## 2019-01-24 LAB — LIPID PANEL WITH LDL/HDL RATIO
CHOLESTEROL TOTAL: 143 mg/dL (ref 100–199)
HDL: 35 mg/dL — ABNORMAL LOW (ref >39)
LDL CALC: 77 mg/dL (ref 0–99)
LDl/HDL Ratio: 2.2 ratio (ref 0.0–3.6)
Triglycerides: 156 mg/dL — ABNORMAL HIGH (ref 0–149)
VLDL CHOLESTEROL CAL: 31 mg/dL (ref 5–40)

## 2019-01-30 ENCOUNTER — Encounter: Payer: Self-pay | Admitting: Family Medicine

## 2019-02-09 ENCOUNTER — Other Ambulatory Visit: Payer: Self-pay | Admitting: Family Medicine

## 2019-02-09 DIAGNOSIS — E785 Hyperlipidemia, unspecified: Secondary | ICD-10-CM

## 2019-02-25 ENCOUNTER — Other Ambulatory Visit: Payer: Self-pay | Admitting: Family Medicine

## 2019-02-25 DIAGNOSIS — E785 Hyperlipidemia, unspecified: Secondary | ICD-10-CM

## 2019-02-28 ENCOUNTER — Ambulatory Visit: Payer: Medicare Other | Admitting: Family Medicine

## 2019-03-09 ENCOUNTER — Other Ambulatory Visit: Payer: Self-pay

## 2019-03-09 ENCOUNTER — Telehealth (INDEPENDENT_AMBULATORY_CARE_PROVIDER_SITE_OTHER): Payer: Medicare Other | Admitting: Family Medicine

## 2019-03-09 DIAGNOSIS — E119 Type 2 diabetes mellitus without complications: Secondary | ICD-10-CM

## 2019-03-09 DIAGNOSIS — E785 Hyperlipidemia, unspecified: Secondary | ICD-10-CM

## 2019-03-09 DIAGNOSIS — I10 Essential (primary) hypertension: Secondary | ICD-10-CM

## 2019-03-09 DIAGNOSIS — R232 Flushing: Secondary | ICD-10-CM

## 2019-03-09 NOTE — Progress Notes (Signed)
Virtual Visit via telephone Note  I connected with patient on 03/09/19 at 1016am by video and verified that I am speaking with the correct person using two identifiers. [name] is currently located at home and patient is currently with her during visit. The provider, Rutherford Guys, MD is located in their office at time of visit.  I discussed the limitations, risks, security and privacy concerns of performing an evaluation and management service by telephone and the availability of in person appointments. I also discussed with the patient that there may be a patient responsible charge related to this service. The patient expressed understanding and agreed to proceed.   Telephone visit today for routine follouwup  HPI ? Patient is a 73 y.o. male with past medical history significant for DM2, migraines, CAD, HTN, OSA, HLP, DDD lumbar spine  who presents today for routine followup  Last OV feb 2020 - having facial flushing with spikes in BP Normal cathecolamines and metanephrines Calcium of 11.3, patient has not had further labs done  He also saw cards for yearly visit in feb 2020, Dr Einar Gip Started on lovaza, however patient has not started due to cost Patient just increased fish oil capsule from 2 to 3 a day  Arthritis and chronic back pain a bit worse with all these storms  Blood pressure spikes have resolved Facial flushing continues in the afternoon Patient reports long standing history of elevated calcium and h/o kidney stones, has seen endo in the past and patient does not remember diagnosis, etiology Was advised to follow a low calcium diet  Chart reviewed done, unable to find any documentation from endocrinology for hypercalcemia  Lab Results  Component Value Date   HGBA1C 6.3 (H) 08/23/2018   HGBA1C 6.3 (H) 02/07/2018   HGBA1C 6.1 08/10/2017   Lab Results  Component Value Date   MICROALBUR 0.5 06/03/2016   LDLCALC 77 01/23/2019   CREATININE 0.99 01/03/2019    BP Readings from Last 3 Encounters:  01/09/19 112/67  01/03/19 124/70  08/23/18 140/74    Fall Risk  03/09/2019 01/03/2019 08/23/2018 08/23/2018 03/26/2018  Falls in the past year? 0 1 No No No  Number falls in past yr: 0 0 - - -  Injury with Fall? 0 0 - - -  Risk for fall due to : - - - - -  Follow up - - - - -     Depression screen [facility] 2/9 03/09/2019 01/03/2019 08/23/2018  Decreased Interest 0 0 0  Down, Depressed, Hopeless 0 0 0  PHQ - 2 Score 0 0 0  Altered sleeping - 0 -  Tired, decreased energy - 0 -  Change in appetite - 0 -  Feeling bad or failure about yourself  - 0 -  Trouble concentrating - 0 -  Moving slowly or fidgety/restless - 0 -  Suicidal thoughts - 0 -  PHQ-9 Score - 0 -  Difficult doing work/chores - Not difficult at all -    Allergies  Allergen Reactions  . Amaryl Other (See Comments)    Gives pt migraines  . Other Other (See Comments)    Pistachios, lima beans, and some types of chocolate cause migraines  Nasal corticosteroids causes nose bleeds  . Skelaxin [Metaxalone] Dermatitis and Other (See Comments)    Cherry red rash with sloughing off of skin at left upper chest/shoulder  . Stool Softener [Dss] Other (See Comments)    Causes migraines  . Norco [Hydrocodone-Acetaminophen] Other (See Comments)  Ineffective  . Pistachio Nut (Diagnostic) Other (See Comments)     In  Addition :Lima beans, milk chocolate   . Doc-Q-Lax [Digital Fever Thermometer] Other (See Comments)    Headache   . Levofloxacin Nausea Only  . Lisinopril Cough  . Metformin And Related Diarrhea    Prior to Admission medications   Medication Sig Start Date End Date Taking? Authorizing Provider  acetaminophen (TYLENOL) 650 MG CR tablet Take 650 mg by mouth 5 (five) times daily.     [provider]  Alum Hydroxide-Mag Trisilicate (GAVISCON) 85-27.7 MG CHEW Chew 1 tablet by mouth daily as needed (for acid reflux).    [provider]  amLODipine (NORVASC) 10 MG  tablet TAKE 1/2 TABLET BY MOUTH DAILY 01/18/19   Rutherford Guys, MD  aspirin 81 MG tablet Take 81 mg by mouth every evening.     [provider]  bimatoprost (LUMIGAN) 0.01 % SOLN Place 1 drop into both eyes every evening.    [provider]  brimonidine-timolol (COMBIGAN) 0.2-0.5 % ophthalmic solution Place 1 drop into both eyes 2 (two) times daily.    [provider]  brinzolamide (AZOPT) 1 % ophthalmic suspension Place 1 drop into both eyes 2 (two) times daily.     [provider]  chlorthalidone (HYGROTON) 25 MG tablet TAKE 1 TABLET(25 MG) BY MOUTH DAILY 02/25/19   Rutherford Guys, MD  diclofenac sodium (VOLTAREN) 1 % GEL Apply 4 g topically 4 (four) times daily. 01/03/19   Rutherford Guys, MD  empagliflozin (JARDIANCE) 25 MG TABS tablet Take 25 mg by mouth daily. 08/23/18   Rutherford Guys, MD  FREESTYLE LITE test strip USE TO TEST BLOOD SUGAR ONCE DAILY 07/20/18   Forrest Moron, MD  ketoconazole (NIZORAL) 2 % cream Apply topically 2 (two) times daily. Patient taking differently: Apply topically as needed.  08/18/18   Rutherford Guys, MD  Magnesium 125 MG CAPS Take by mouth. Every 2-3 days    [provider]  metoprolol tartrate (LOPRESSOR) 25 MG tablet Take 25 mg by mouth 2 (two) times daily.    [provider]  Multiple Vitamins-Minerals (PRESERVISION AREDS 2) CAPS Take 1 capsule by mouth 2 (two) times daily.    [provider]  naproxen sodium (ALEVE) 220 MG tablet Take 220 mg by mouth at bedtime.    [provider]  nitroGLYCERIN (NITROSTAT) 0.4 MG SL tablet Place 1 tablet (0.4 mg total) under the tongue every 5 (five) minutes as needed for up to 25 days for chest pain. 01/09/19 02/03/19  Adrian Prows, MD  omega-3 acid ethyl esters (LOVAZA) 1 g capsule Take 2 capsules (2 g total) by mouth 2 (two) times daily. 01/09/19   Adrian Prows, MD  OVER THE COUNTER MEDICATION Take 1,000 mg by mouth daily. Microlactin 1000 mg Supplement     [provider]  OVER THE COUNTER MEDICATION Take 2-3 drops by mouth See admin instructions. CBD Hemp Oil - Place 3 drops in mouth in the morning, place 2 drops in mouth at lunch and dinner, and place 3 drops in mouth at bedtime.    [provider]  potassium chloride (K-DUR) 10 MEQ tablet TAKE 1 TABLET(10 MEQ) BY MOUTH TWICE DAILY 11/28/18   Rutherford Guys, MD  rosuvastatin (CRESTOR) 10 MG tablet TAKE 1 TABLET BY MOUTH DAILY 02/25/19   Rutherford Guys, MD  sitaGLIPtin (JANUVIA) 100 MG tablet Take 1 tablet (100 mg total) by mouth daily.  08/23/18   Rutherford Guys, MD  SUMAtriptan (IMITREX) 50 MG tablet TAKE 1 TABLET BY MOUTH EVERY 2 HOURS AS NEEDED FOR MIGRAINE Patient taking differently: Take 50 mg by mouth every 2 (two) hours as needed for migraine.  03/05/17   Wardell Honour, MD  traMADol (ULTRAM-ER) 100 MG 24 hr tablet Take 100 mg by mouth daily as needed for pain.    [provider]    Past Medical History:  Diagnosis Date  . Arthritis    DDD, scoliosis, stenosis    . Atherosclerotic heart disease native coronary artery w/angina pectoris ([place])   . Calcium blood increased   . Cancer (HCC)    skin ca, posterior R ear   (BENIGN)  . Complication of anesthesia    difficulty intubation , anterior larynx, easy mask ventilation  . Coronary artery disease   . Diabetes mellitus without complication ([place])   . Difficult intubation   . Diverticulitis   . Glaucoma   . Headache(784.0)    migraines  . History of kidney stones   . Hyperlipemia   . Hypertension   . 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 Sleep apnea    CPAP still in use q night, & for naps thru out the day, pt. reports that last study was neg for sleep apnea but he still uses the device    . Spinal stenosis   . Ventricular dilatation    Right    Past Surgical History:  Procedure Laterality Date  . APPENDECTOMY  1991  . BOWEL RESECTION  1985  . CARDIAC CATHETERIZATION  2006  .  CATARACT EXTRACTION Right   . CHOLECYSTECTOMY  1990  . COLON SURGERY     bowel resection, for diverticulitis   . COLONOSCOPY N/A 06/15/2013   Procedure: COLONOSCOPY;  Surgeon: Beryle Beams, MD;  Location: [facility];  Service: Endoscopy;  Laterality: N/A;  . COLONOSCOPY WITH PROPOFOL N/A 07/01/2018   Procedure: COLONOSCOPY WITH PROPOFOL;  Surgeon: Carol Ada, MD;  [facility];  Service: Endoscopy;  Laterality: N/A;  . CORONARY ANGIOPLASTY     STENT PLACED  . LAMINECTOMY WITH POSTERIOR LATERAL ARTHRODESIS LEVEL 1 N/A 04/09/2017   Procedure: Lumbar TwoThree,Lumbar Three-Four,Lumbar Four-Five Laminectomy with Lumbar Five-Sacral One Posterior lateral fusion;  Surgeon: Eustace Moore, MD;  Location: [place];  Service: Neurosurgery;  Laterality: N/A;  . LITHOTRIPSY    . POLYPECTOMY  07/01/2018   Procedure: POLYPECTOMY;  Surgeon: Carol Ada, MD;  Location: [facility];  Service: Endoscopy;;  . SPINAL CORD STIMULATOR INSERTION N/A 03/20/2016   Procedure: LUMBAR SPINAL CORD STIMULATOR INSERTION;  Surgeon: Clydell Hakim, MD;  Location: [place] [facility];  Service: Neurosurgery;  Laterality: N/A;  . TONSILLECTOMY    . [place] ?    Social History   Tobacco Use  . Smoking status: Never Smoker  . Smokeless tobacco: Never Used  Substance Use Topics  . Alcohol use: Yes    Alcohol/week: 0.0 standard drinks    Comment: seldom     Family History  Problem Relation Age of Onset  . Heart disease Father   . Heart attack Father   . Heart disease Paternal Grandmother     ROS  Denies any fever or chills, cough, SOB, CP, edema, abd pain, n/v Endorses dry skin, knee and back pain Per hpi  Objective  Vitals as reported by the patient:  117/70, 64, blood glucose 146, this morning  There were no  vitals filed for this visit.  ASSESSMENT and PLAN  1. Type 2 diabetes mellitus without complication, without long-term current use of insulin ([place]) Per last a1c  controlled. Checking labs, medications will be adjusted as needed.  - Hemoglobin A1c; Future - Microalbumin/Creatinine Ratio, Urine; Future  2. Essential hypertension Controlled. Continue current regime.   3. Dyslipidemia Unable to afford lovaza, has increased fish oil. Checking labs.  - Lipid panel; Future  4. Hypercalcemia No records on file, ROI from endocrinology will be obtained  5. Facial flushing Unclear etiology.  FOLLOW-UP: 6 months   The above assessment and management plan was discussed with the patient. The patient verbalized understanding of and has agreed to the management plan. Patient is aware to call the clinic if symptoms persist or worsen. Patient is aware when to return to the clinic for a follow-up visit. Patient educated on when it is appropriate to go to the emergency department.    I provided 25 minutes of non-face-to-face time during this encounter.  Rutherford Guys, MD Primary Care at [place] [place], [place] [address] Ph.  [phone] Fax [phone]

## 2019-03-09 NOTE — Progress Notes (Signed)
Pt discussing chronic conditions. No refills needed at this time. Just received a 90 day supply.

## 2019-03-13 ENCOUNTER — Other Ambulatory Visit: Payer: Self-pay

## 2019-03-13 ENCOUNTER — Ambulatory Visit (INDEPENDENT_AMBULATORY_CARE_PROVIDER_SITE_OTHER): Payer: Medicare Other | Admitting: Family Medicine

## 2019-03-13 DIAGNOSIS — E785 Hyperlipidemia, unspecified: Secondary | ICD-10-CM | POA: Diagnosis not present

## 2019-03-13 DIAGNOSIS — E119 Type 2 diabetes mellitus without complications: Secondary | ICD-10-CM | POA: Diagnosis not present

## 2019-03-14 LAB — HEMOGLOBIN A1C
Est. average glucose Bld gHb Est-mCnc: 137 mg/dL
Hgb A1c MFr Bld: 6.4 % — ABNORMAL HIGH (ref 4.8–5.6)

## 2019-03-14 LAB — MICROALBUMIN / CREATININE URINE RATIO
Creatinine, Urine: 64.6 mg/dL
Microalb/Creat Ratio: 17 mg/g creat (ref 0–29)
Microalbumin, Urine: 10.8 ug/mL

## 2019-03-14 LAB — LIPID PANEL
Chol/HDL Ratio: 4.4 ratio (ref 0.0–5.0)
Cholesterol, Total: 153 mg/dL (ref 100–199)
HDL: 35 mg/dL — ABNORMAL LOW (ref >39)
LDL Calculated: 63 mg/dL (ref 0–99)
Triglycerides: 276 mg/dL — ABNORMAL HIGH (ref 0–149)
VLDL Cholesterol Cal: 55 mg/dL — ABNORMAL HIGH (ref 5–40)

## 2019-04-14 ENCOUNTER — Other Ambulatory Visit: Payer: Self-pay | Admitting: Family Medicine

## 2019-05-26 ENCOUNTER — Other Ambulatory Visit: Payer: Self-pay | Admitting: Family Medicine

## 2019-05-26 DIAGNOSIS — E785 Hyperlipidemia, unspecified: Secondary | ICD-10-CM

## 2019-06-10 ENCOUNTER — Other Ambulatory Visit: Payer: Self-pay | Admitting: Family Medicine

## 2019-06-10 NOTE — Telephone Encounter (Signed)
Requested Prescriptions  Pending Prescriptions Disp Refills  . potassium chloride (K-DUR) 10 MEQ tablet [Pharmacy Med Name: POTASSIUM CL 10MEQ ER TABLETS] 180 tablet 0    Sig: TAKE 1 TABLET(10 MEQ) BY MOUTH TWICE DAILY     Endocrinology:  Minerals - Potassium Supplementation Passed - 06/10/2019  9:41 AM      Passed - K in normal range and within 360 days    Potassium  Date Value Ref Range Status  01/03/2019 3.6 3.5 - 5.2 mmol/L Final         Passed - Cr in normal range and within 360 days    Creat  Date Value Ref Range Status  08/29/2016 0.93 0.70 - 1.18 mg/dL Final    Comment:      For patients > or = 73 years of age: The upper reference limit for Creatinine is approximately 13% higher for people identified as African-American.      Creatinine, Ser  Date Value Ref Range Status  01/03/2019 0.99 0.76 - 1.27 mg/dL Final         Passed - Valid encounter within last 12 months    Recent Outpatient Visits          2 months ago Dyslipidemia   [facility], Lilia Argue, MD   4 months ago Hypercalcemia   [facility], Ranell Patrick, MD   5 months ago Facial flushing   [facility], Lilia Argue, MD   9 months ago Encounter for [employer] annual wellness exam   [facility], Lilia Argue, MD   1 year ago Type 2 diabetes mellitus without complication, without long-term current use of insulin [facility])   Primary Care at [facility], Renette Butters, MD      Future Appointments            In 2 months Rutherford Guys, MD Primary Care at [place], [facility]   In 7 months Adrian Prows, MD [facility]

## 2019-06-15 DIAGNOSIS — Z961 Presence of intraocular lens: Secondary | ICD-10-CM | POA: Diagnosis not present

## 2019-06-15 DIAGNOSIS — H2512 Age-related nuclear cataract, left eye: Secondary | ICD-10-CM | POA: Diagnosis not present

## 2019-06-15 DIAGNOSIS — H401133 Primary open-angle glaucoma, bilateral, severe stage: Secondary | ICD-10-CM | POA: Diagnosis not present

## 2019-06-15 DIAGNOSIS — H50332 Intermittent monocular exotropia, left eye: Secondary | ICD-10-CM | POA: Diagnosis not present

## 2019-07-03 ENCOUNTER — Telehealth: Payer: Self-pay | Admitting: Family Medicine

## 2019-07-03 ENCOUNTER — Other Ambulatory Visit: Payer: Self-pay

## 2019-07-03 DIAGNOSIS — E119 Type 2 diabetes mellitus without complications: Secondary | ICD-10-CM

## 2019-07-03 MED ORDER — SITAGLIPTIN PHOSPHATE 100 MG PO TABS: 100.0000 mg | ORAL_TABLET | Freq: Every day | ORAL | 3 refills | Status: DC

## 2019-07-03 MED ORDER — EMPAGLIFLOZIN 25 MG PO TABS: 25.0000 mg | ORAL_TABLET | Freq: Every day | ORAL | 3 refills | Status: DC

## 2019-07-03 NOTE — Telephone Encounter (Signed)
Medication: empagliflo, zin (JARDIANCE) 25 MG TABS tablet [[id number]] , sitaGLIPtin (JANUVIA) 100 MG tablet [[id number]]   Has the patient contacted their pharmacy? Yes  (Agent: If no, request that the patient contact the pharmacy for the refill.) (Agent: If yes, when and what did the pharmacy advise?)  Preferred Pharmacy (with phone number or street name): [email or web address] [phone] Phone-[phone]  Agent: Please be advised that RX refills may take up to 3 business days. We ask that you follow-up with your pharmacy.

## 2019-07-10 ENCOUNTER — Other Ambulatory Visit: Payer: Self-pay

## 2019-07-10 ENCOUNTER — Encounter: Payer: Self-pay | Admitting: Family Medicine

## 2019-07-10 ENCOUNTER — Ambulatory Visit (INDEPENDENT_AMBULATORY_CARE_PROVIDER_SITE_OTHER): Payer: Medicare Other | Admitting: Family Medicine

## 2019-07-10 ENCOUNTER — Encounter

## 2019-07-10 VITALS — BP 117/71 | HR 67 | Temp 98.4°F | Ht 72.0 in | Wt 205.0 lb

## 2019-07-10 DIAGNOSIS — J3489 Other specified disorders of nose and nasal sinuses: Secondary | ICD-10-CM

## 2019-07-10 DIAGNOSIS — Z23 Encounter for immunization: Secondary | ICD-10-CM | POA: Diagnosis not present

## 2019-07-10 NOTE — Patient Instructions (Signed)
° ° ° °  If you have lab work done today you will be contacted with your lab results within the next 2 weeks.  If you have not heard from us then please contact us. The fastest way to get your results is to register for My Chart. ° ° °IF you received an x-ray today, you will receive an invoice from [facility]. Please contact [facility] at [phone] with questions or concerns regarding your invoice.  ° °IF you received labwork today, you will receive an invoice from [facility]. Please contact [facility] at [phone] with questions or concerns regarding your invoice.  ° °Our billing staff will not be able to assist you with questions regarding bills from these companies. ° °You will be contacted with the lab results as soon as they are available. The fastest way to get your results is to activate your My Chart account. Instructions are located on the last page of this paperwork. If you have not heard from us regarding the results in 2 weeks, please contact this office. °  ° ° ° °

## 2019-07-10 NOTE — Progress Notes (Signed)
8/24/202010:19 AM  [name] Oct 08, 1946, 73 y.o., male [id number]  Chief Complaint  Patient presents with  . Nose Problem    says he has a growth on the inside of the nostril, noticed a few months go. Tried flushing is out, feels clogged    HPI:   Patient is a 74 y.o. 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 by surgeon in [place] Couple of months ago he started noticing a growth inside his left nostril He reports that it feels it is causing airflow restriction  Has been having recurring teeth infections Under dental care Usually needs to be treated with clindamycin  Depression screen [facility] 2/9 07/10/2019 03/09/2019 01/03/2019  Decreased Interest 0 0 0  Down, Depressed, Hopeless 0 0 0  PHQ - 2 Score 0 0 0  Altered sleeping - - 0  Tired, decreased energy - - 0  Change in appetite - - 0  Feeling bad or failure about yourself  - - 0  Trouble concentrating - - 0  Moving slowly or fidgety/restless - - 0  Suicidal thoughts - - 0  PHQ-9 Score - - 0  Difficult doing work/chores - - Not difficult at all    Fall Risk  07/10/2019 03/09/2019 01/03/2019 08/23/2018 08/23/2018  Falls in the past year? 0 0 1 No No  Number falls in past yr: 0 0 0 - -  Injury with Fall? 0 0 0 - -  Risk for fall due to : - - - - -  Follow up - - - - -     Allergies  Allergen Reactions  . Amaryl Other (See Comments)    Gives pt migraines  . Other Other (See Comments)    Pistachios, lima beans, and some types of chocolate cause migraines  Nasal corticosteroids causes nose bleeds  . Skelaxin [Metaxalone] Dermatitis and Other (See Comments)    Cherry red rash with sloughing off of skin at left upper chest/shoulder  . Stool Softener [Dss] Other (See Comments)    Causes migraines  . Norco  [Hydrocodone-Acetaminophen] Other (See Comments)    Ineffective  . Pistachio Nut (Diagnostic) Other (See Comments)     In  Addition :Lima beans, milk chocolate   . Doc-Q-Lax [Digital Fever Thermometer] Other (See Comments)    Headache   . Levofloxacin Nausea Only  . Lisinopril Cough  . Metformin And Related Diarrhea    Prior to Admission medications   Medication Sig Start Date End Date Taking? Authorizing Provider  acetaminophen (TYLENOL) 650 MG CR tablet Take 650 mg by mouth 5 (five) times daily.    Yes [provider]  Alum Hydroxide-Mag Trisilicate (GAVISCON) [id number] MG CHEW Chew 1 tablet by mouth daily as needed (for acid reflux).   Yes [provider]  amLODipine (NORVASC) 10 MG tablet TAKE 1/2 TABLET BY MOUTH EVERY DAY 04/14/19  Yes Rutherford Guys, MD  aspirin 81 MG tablet Take 81 mg by mouth every evening.    Yes [provider]  bimatoprost (LUMIGAN) 0.01 % SOLN Place 1 drop into both eyes every evening.   Yes [provider]  brimonidine-timolol (COMBIGAN) 0.2-0.5 % ophthalmic solution Place 1 drop into both eyes 2 (two) times daily.   Yes [provider]  brinzolamide (AZOPT) 1 %  ophthalmic suspension Place 1 drop into both eyes 2 (two) times daily.    Yes [provider]  chlorthalidone (HYGROTON) 25 MG tablet TAKE 1 TABLET(25 MG) BY MOUTH DAILY 05/26/19  Yes Rutherford Guys, MD  diclofenac sodium (VOLTAREN) 1 % GEL Apply 4 g topically 4 (four) times daily. 01/03/19  Yes Rutherford Guys, MD  empagliflozin (JARDIANCE) 25 MG TABS tablet Take 25 mg by mouth daily. 07/03/19  Yes Rutherford Guys, MD  FREESTYLE LITE test strip USE TO TEST BLOOD SUGAR ONCE DAILY 07/20/18  Yes Forrest Moron, MD  Magnesium 125 MG CAPS Take by mouth. Every 2-3 days   Yes [provider]  metoprolol tartrate (LOPRESSOR) 25 MG tablet Take 25 mg by mouth 2 (two) times daily.   Yes [provider]  Multiple Vitamins-Minerals  (PRESERVISION AREDS 2) CAPS Take 1 capsule by mouth 2 (two) times daily.   Yes [provider]  naproxen sodium (ALEVE) 220 MG tablet Take 220 mg by mouth at bedtime.   Yes [provider]  omega-3 acid ethyl esters (LOVAZA) 1 g capsule Take 2 capsules (2 g total) by mouth 2 (two) times daily. 01/09/19  Yes Adrian Prows, MD  OVER THE COUNTER MEDICATION Take 1,000 mg by mouth daily. Microlactin 1000 mg Supplement   Yes [provider]  OVER THE COUNTER MEDICATION Take 2-3 drops by mouth See admin instructions. CBD Hemp Oil - Place 3 drops in mouth in the morning, place 2 drops in mouth at lunch and dinner, and place 3 drops in mouth at bedtime.   Yes [provider]  potassium chloride (K-DUR) 10 MEQ tablet TAKE 1 TABLET(10 MEQ) BY MOUTH TWICE DAILY 06/10/19  Yes Rutherford Guys, MD  rosuvastatin (CRESTOR) 10 MG tablet TAKE 1 TABLET BY MOUTH DAILY 05/26/19  Yes Rutherford Guys, MD  sitaGLIPtin (JANUVIA) 100 MG tablet Take 1 tablet (100 mg total) by mouth daily. 07/03/19  Yes Rutherford Guys, MD  SUMAtriptan (IMITREX) 50 MG tablet TAKE 1 TABLET BY MOUTH EVERY 2 HOURS AS NEEDED FOR MIGRAINE Patient taking differently: Take 50 mg by mouth every 2 (two) hours as needed for migraine.  03/05/17  Yes Wardell Honour, MD  traMADol (ULTRAM-ER) 100 MG 24 hr tablet Take 100 mg by mouth daily as needed for pain.   Yes [provider]  nitroGLYCERIN (NITROSTAT) 0.4 MG SL tablet Place 1 tablet (0.4 mg total) under the tongue every 5 (five) minutes as needed for up to 25 days for chest pain. 01/09/19 02/03/19  Adrian Prows, MD    Past Medical History:  Diagnosis Date  . Arthritis    DDD, scoliosis, stenosis    . Atherosclerotic heart disease native coronary artery w/angina pectoris ([place])   . Calcium blood increased   . Cancer (HCC)    skin ca, posterior R ear   ([place]EN[place])  . Complication of anesthesia    difficulty intubation , anterior larynx, easy mask ventilation  .  Coronary artery disease   . Diabetes mellitus without complication ([place])   . Difficult intubation   . Diverticulitis   . Glaucoma   . Headache(784.0)    migraines  . History of kidney stones   . Hyperlipemia   . Hypertension   . 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 Sleep apnea    CPAP still in use q night, & for naps thru out the day, pt. reports that last study was neg for sleep apnea but  he still uses the device    . Spinal stenosis   . Ventricular dilatation    Right    Past Surgical History:  Procedure Laterality Date  . APPENDECTOMY  1991  . BOWEL RESECTION  1985  . CARDIAC CATHETERIZATION  2006  . CATARACT EXTRACTION Right   . CHOLECYSTECTOMY  1990  . COLON SURGERY     bowel resection, for diverticulitis   . COLONOSCOPY N/A 06/15/2013   Procedure: COLONOSCOPY;  Surgeon: Beryle Beams, MD;  [facility];  Service: Endoscopy;  Laterality: N/A;  . COLONOSCOPY WITH PROPOFOL N/A 07/01/2018   Procedure: COLONOSCOPY WITH PROPOFOL;  Surgeon: Carol Ada, MD;  [facility];  Service: Endoscopy;  Laterality: N/A;  . CORONARY ANGIOPLASTY     STENT PLACED  . LAMINECTOMY WITH POSTERIOR LATERAL ARTHRODESIS LEVEL 1 N/A 04/09/2017   Procedure: Lumbar TwoThree,Lumbar Three-Four,Lumbar Four-Five Laminectomy with Lumbar Five-Sacral One Posterior lateral fusion;  Surgeon: Eustace Moore, MD;  Location: [place];  Service: Neurosurgery;  Laterality: N/A;  . LITHOTRIPSY    . POLYPECTOMY  07/01/2018   Procedure: POLYPECTOMY;  Surgeon: Carol Ada, MD;  Location: [facility];  Service: Endoscopy;;  . SPINAL CORD STIMULATOR INSERTION N/A 03/20/2016   Procedure: LUMBAR SPINAL CORD STIMULATOR INSERTION;  Surgeon: Clydell Hakim, MD;  Location: [place] [facility];  Service: Neurosurgery;  Laterality: N/A;  . TONSILLECTOMY    . [place] ?    Social History   Tobacco Use  . Smoking status: Never Smoker  . Smokeless tobacco: Never Used  Substance  Use Topics  . Alcohol use: Yes    Alcohol/week: 0.0 standard drinks    Comment: seldom     Family History  Problem Relation Age of Onset  . Heart disease Father   . Heart attack Father   . Heart disease Paternal Grandmother     ROS Per hpi  OBJECTIVE:  Today's Vitals   07/10/19 1010  BP: 117/71  Pulse: 67  Temp: 98.4 F (36.9 C)  TempSrc: Oral  SpO2: 94%  Weight: 205 lb (93 kg)  Height: 6' (1.829 m)   Body mass index is 27.8 kg/m.   Physical Exam Vitals signs and nursing note reviewed.  Constitutional:      Appearance: He is well-developed.  HENT:     Head: Normocephalic and atraumatic.     Nose: Septal deviation present. No mucosal edema.     Left Nostril: No occlusion.     Comments: No obvious masses or lesions noted Eyes:     Conjunctiva/sclera: Conjunctivae normal.     Pupils: Pupils are equal, round, and reactive to light.  Neck:     Musculoskeletal: Neck supple.  Pulmonary:     Effort: Pulmonary effort is normal.  Skin:    General: Skin is warm and dry.  Neurological:     Mental Status: He is alert and oriented to person, place, and time.     No results found for this or any previous visit (from the past 24 hour(s)).  No results found.   ASSESSMENT and PLAN  1. Nasal obstruction - Ambulatory referral to ENT  2. Need for vaccination - Flu Vaccine QUAD High Dose(Fluad)  Other orders - bisacodyl (BISACODYL) 5 MG EC tablet; Take 5 mg by mouth daily as needed for moderate constipation.  Return in about 3 months (around 10/10/2019) for DM2, HTN, HLP.    Rutherford Guys, MD Primary Care at [place]  [place], [place] [address] Ph.  [phone] Fax [phone]

## 2019-07-11 ENCOUNTER — Encounter: Payer: Self-pay | Admitting: Family Medicine

## 2019-07-11 DIAGNOSIS — E119 Type 2 diabetes mellitus without complications: Secondary | ICD-10-CM

## 2019-07-17 ENCOUNTER — Other Ambulatory Visit: Payer: Self-pay

## 2019-07-17 DIAGNOSIS — E119 Type 2 diabetes mellitus without complications: Secondary | ICD-10-CM

## 2019-07-17 DIAGNOSIS — J310 Chronic rhinitis: Secondary | ICD-10-CM | POA: Diagnosis not present

## 2019-07-17 DIAGNOSIS — J342 Deviated nasal septum: Secondary | ICD-10-CM | POA: Diagnosis not present

## 2019-07-17 MED ORDER — SITAGLIPTIN PHOSPHATE 100 MG PO TABS: 100.0000 mg | ORAL_TABLET | Freq: Every day | ORAL | 3 refills | Status: DC

## 2019-07-17 MED ORDER — EMPAGLIFLOZIN 25 MG PO TABS: 25.0000 mg | ORAL_TABLET | Freq: Every day | ORAL | 3 refills | Status: DC

## 2019-07-17 NOTE — Telephone Encounter (Signed)
Pt Came in to PCP and picked up these scripts.

## 2019-07-18 ENCOUNTER — Telehealth: Payer: Self-pay | Admitting: Family Medicine

## 2019-07-18 NOTE — Telephone Encounter (Signed)
[name], from pharmacy, called regarding junuvia medication that was sent over for pt yesterday. She states it was missing the providers signature. Please advise. [phone]

## 2019-07-19 NOTE — Telephone Encounter (Signed)
Pt came into the office and picked up Rx so he was suppose to take to pharmacy.

## 2019-07-21 ENCOUNTER — Other Ambulatory Visit: Payer: Self-pay | Admitting: Family Medicine

## 2019-07-21 NOTE — Telephone Encounter (Signed)
Requested medication (s) are due for refill today: yes   Requested medication (s) are on the active medication list: yes  Last refill:  05/06/2019  Future visit scheduled: yes  Notes to clinic:  Review for refill pcp and ordering provider different    Requested Prescriptions  Pending Prescriptions Disp Refills   FREESTYLE LITE test strip [Pharmacy Med Name: FREESTYLE LITE BLOOD GLUCOSE STRIPS] 100 strip     Sig: USE TO TEST BLOOD SUGAR ONCE DAILY     There is no refill protocol information for this order

## 2019-07-25 ENCOUNTER — Encounter: Payer: Self-pay | Admitting: Family Medicine

## 2019-07-26 ENCOUNTER — Other Ambulatory Visit: Payer: Self-pay | Admitting: *Deleted

## 2019-07-26 DIAGNOSIS — E119 Type 2 diabetes mellitus without complications: Secondary | ICD-10-CM

## 2019-07-26 MED ORDER — FREESTYLE LANCETS MISC: 12 refills | Status: AC

## 2019-07-28 ENCOUNTER — Encounter: Payer: Self-pay | Admitting: Family Medicine

## 2019-07-28 DIAGNOSIS — E119 Type 2 diabetes mellitus without complications: Secondary | ICD-10-CM

## 2019-07-31 MED ORDER — EMPAGLIFLOZIN 25 MG PO TABS: 25.0000 mg | ORAL_TABLET | Freq: Every day | ORAL | 3 refills | Status: DC

## 2019-07-31 MED ORDER — SITAGLIPTIN PHOSPHATE 100 MG PO TABS: 100.0000 mg | ORAL_TABLET | Freq: Every day | ORAL | 3 refills | Status: AC

## 2019-07-31 NOTE — Telephone Encounter (Signed)
Called patient, discussed his concerns [place] and jadiance refaxed to [email or web address]

## 2019-08-04 ENCOUNTER — Other Ambulatory Visit: Payer: Self-pay | Admitting: Family Medicine

## 2019-08-04 NOTE — Telephone Encounter (Signed)
Requested Prescriptions  Pending Prescriptions Disp Refills  . amLODipine (NORVASC) 10 MG tablet [Pharmacy Med Name: AMLODIPINE BESYLATE 10MG  TABLETS] 90 tablet 0    Sig: TAKE 1/2 TABLET BY MOUTH EVERYDAY     Cardiovascular:  Calcium Channel Blockers Passed - 08/04/2019  4:57 PM      Passed - Last BP in normal range    BP Readings from Last 1 Encounters:  07/10/19 117/71         Passed - Valid encounter within last 6 months    Recent Outpatient Visits          3 weeks ago Nasal obstruction   [facility], Lilia Argue, MD   4 months ago Dyslipidemia   [facility], Lilia Argue, MD   4 months ago Type 2 diabetes mellitus without complication, without long-term current use of insulin [facility])   [facility], Lilia Argue, MD   6 months ago Hypercalcemia   [facility], Ranell Patrick, MD   7 months ago Facial flushing   [facility], Lilia Argue, MD      Future Appointments            In 2 months Rutherford Guys, MD [facility] at [place], [facility]   In 5 months Adrian Prows, MD [facility]

## 2019-08-04 NOTE — Telephone Encounter (Signed)
Requested Prescriptions  Pending Prescriptions Disp Refills  . amLODipine (NORVASC) 10 MG tablet [Pharmacy Med Name: AMLODIPINE BESYLATE 10MG  TABLETS] 90 tablet 0    Sig: TAKE 1/2 TABLET BY MOUTH EVERYDAY     Cardiovascular:  Calcium Channel Blockers Passed - 08/04/2019  4:57 PM      Passed - Last BP in normal range    BP Readings from Last 1 Encounters:  07/10/19 117/71         Passed - Valid encounter within last 6 months    Recent Outpatient Visits          3 weeks ago Nasal obstruction   [facility], Lilia Argue, MD   4 months ago Dyslipidemia   [facility], Lilia Argue, MD   4 months ago Type 2 diabetes mellitus without complication, without long-term current use of insulin [facility])   [facility], Lilia Argue, MD   6 months ago Hypercalcemia   [facility], Ranell Patrick, MD   7 months ago Facial flushing   [facility], Lilia Argue, MD      Future Appointments            In 2 months Rutherford Guys, MD [facility] at [place], [facility]   In 5 months Adrian Prows, MD [facility]

## 2019-08-10 ENCOUNTER — Telehealth: Payer: Self-pay

## 2019-08-10 NOTE — Telephone Encounter (Signed)
Received cmn form late for lancets, form was put in the wrong providers box. Form dated for the 9/8. Form was faxed today with conf at 444pm

## 2019-08-24 ENCOUNTER — Other Ambulatory Visit: Payer: Self-pay | Admitting: Family Medicine

## 2019-08-24 DIAGNOSIS — E785 Hyperlipidemia, unspecified: Secondary | ICD-10-CM

## 2019-09-04 ENCOUNTER — Other Ambulatory Visit: Payer: Self-pay | Admitting: Family Medicine

## 2019-09-04 ENCOUNTER — Ambulatory Visit: Payer: Medicare Other | Admitting: Family Medicine

## 2019-10-10 ENCOUNTER — Other Ambulatory Visit: Payer: Self-pay

## 2019-10-10 ENCOUNTER — Encounter: Payer: Self-pay | Admitting: Family Medicine

## 2019-10-10 ENCOUNTER — Ambulatory Visit (INDEPENDENT_AMBULATORY_CARE_PROVIDER_SITE_OTHER): Payer: Medicare Other | Admitting: Family Medicine

## 2019-10-10 VITALS — BP 110/74 | HR 65 | Temp 98.6°F | Ht 72.0 in | Wt 205.0 lb

## 2019-10-10 DIAGNOSIS — K623 Rectal prolapse: Secondary | ICD-10-CM

## 2019-10-10 DIAGNOSIS — E785 Hyperlipidemia, unspecified: Secondary | ICD-10-CM

## 2019-10-10 DIAGNOSIS — R232 Flushing: Secondary | ICD-10-CM | POA: Diagnosis not present

## 2019-10-10 DIAGNOSIS — I10 Essential (primary) hypertension: Secondary | ICD-10-CM

## 2019-10-10 DIAGNOSIS — E119 Type 2 diabetes mellitus without complications: Secondary | ICD-10-CM | POA: Diagnosis not present

## 2019-10-10 NOTE — Progress Notes (Signed)
11/24/202010:05 AM  [name] December 06, 1945, 73 y.o., male [id number]  Chief Complaint  Patient presents with  . Follow-up    htn, hlp, dm2. Having facial flushing after meal time. Would like to revisit this conversation    HPI:   Patient is a 73 y.o. male with past medical history significant for  DM2, HLP, HTN, OSA, DDD of lumbar spine s/p fusion, who presents today for routine followup  Last OV aug 2020  He has been overall doing well Taking all meds as prescribed  Increased body aches after being more active caring for his wife after knee injury  Facial flushing lasting remainder of the day once it starts, mostly after lunch and dinner, he is worried about food allergies - wonders about gluten and green peas, only nut peanut butter No nausea, vomiting, abd pain, diarrhea, swelling of lips Has known issues with pistachios - migraines Rarely elevated blood pressure - normal metanephrines and catecholamines on feb 2020  Concerned about recurring rectal prolapse, small, keeps clean and pushes back in, no problems with constipation, Dr Benson Norway is his GI  Lab Results  Component Value Date   HGBA1C 6.4 (H) 03/13/2019   HGBA1C 6.3 (H) 08/23/2018   HGBA1C 6.3 (H) 02/07/2018   Lab Results  Component Value Date   MICROALBUR 0.5 06/03/2016   [place] 63 03/13/2019   CREATININE 0.99 01/03/2019  urine micro done April 2020  Diabetes Health Maintenance Due  Topic Date Due  . FOOT EXAM  08/10/2018  . HEMOGLOBIN A1C  09/12/2019  . URINE MICROALBUMIN  03/12/2020  . OPHTHALMOLOGY EXAM  05/16/2020    Depression screen [facility] 2/9 10/10/2019 07/10/2019 03/09/2019  Decreased Interest 0 0 0  Down, Depressed, Hopeless 0 0 0  PHQ - 2 Score 0 0 0  Altered sleeping - - -  Tired, decreased energy - - -  Change in appetite - - -  Feeling bad or failure about yourself  - - -  Trouble concentrating - - -  Moving slowly or fidgety/restless - - -  Suicidal thoughts - - -  PHQ-9 Score - -  -  Difficult doing work/chores - - -    Fall Risk  10/10/2019 07/10/2019 03/09/2019 01/03/2019 08/23/2018  Falls in the past year? 1 0 0 1 No  Number falls in past yr: 1 0 0 0 -  Injury with Fall? 1 0 0 0 -  Risk for fall due to : - - - - -  Follow up - - - - -     Allergies  Allergen Reactions  . Amaryl Other (See Comments)    Gives pt migraines  . Other Other (See Comments)    Pistachios, lima beans, and some types of chocolate cause migraines  Nasal corticosteroids causes nose bleeds  . Skelaxin [Metaxalone] Dermatitis and Other (See Comments)    Cherry red rash with sloughing off of skin at left upper chest/shoulder  . Stool Softener [Dss] Other (See Comments)    Causes migraines  . Norco [Hydrocodone-Acetaminophen] Other (See Comments)    Ineffective  . Pistachio Nut (Diagnostic) Other (See Comments)     In  Addition :Lima beans, milk chocolate   . Doc-Q-Lax [Digital Fever Thermometer] Other (See Comments)    Headache   . Levofloxacin Nausea Only  . Lisinopril Cough  . Metformin And Related Diarrhea    Prior to Admission medications   Medication Sig Start Date End Date Taking? Authorizing Provider  acetaminophen (TYLENOL) 650 MG  CR tablet Take 650 mg by mouth 5 (five) times daily.    Yes [provider]  amLODipine (NORVASC) 10 MG tablet TAKE 1/2 TABLET BY MOUTH EVERYDAY 08/04/19  Yes Rutherford Guys, MD  aspirin 81 MG tablet Take 81 mg by mouth every evening.    Yes [provider]  bimatoprost (LUMIGAN) 0.01 % SOLN Place 1 drop into both eyes every evening.   Yes [provider]  brimonidine-timolol (COMBIGAN) 0.2-0.5 % ophthalmic solution Place 1 drop into both eyes 2 (two) times daily.   Yes [provider]  brinzolamide (AZOPT) 1 % ophthalmic suspension Place 1 drop into both eyes 2 (two) times daily.    Yes [provider]  chlorthalidone (HYGROTON) 25 MG tablet TAKE 1 TABLET(25 MG) BY MOUTH DAILY 08/24/19  Yes Rutherford Guys, MD  diclofenac sodium (VOLTAREN) 1 % GEL Apply 4 g topically 4 (four) times daily. 01/03/19  Yes Rutherford Guys, MD  empagliflozin (JARDIANCE) 25 MG TABS tablet Take 25 mg by mouth daily. 07/31/19  Yes Rutherford Guys, MD  FREESTYLE LITE test strip USE TO TEST BLOOD SUGAR ONCE DAILY 07/21/19  Yes Delia Chimes A, MD  Lancets (FREESTYLE) lancets Use to test blood sugar once daily 07/26/19  Yes Rutherford Guys, MD  Magnesium 125 MG CAPS Take by mouth. Every 2-3 days   Yes [provider]  metoprolol tartrate (LOPRESSOR) 25 MG tablet Take 25 mg by mouth 2 (two) times daily.   Yes [provider]  Multiple Vitamins-Minerals (PRESERVISION AREDS 2) CAPS Take 1 capsule by mouth 2 (two) times daily.   Yes [provider]  naproxen sodium (ALEVE) 220 MG tablet Take 220 mg by mouth at bedtime.   Yes [provider]  OVER THE COUNTER MEDICATION Take 1,000 mg by mouth daily. Microlactin 1000 mg Supplement   Yes [provider]  OVER THE COUNTER MEDICATION Take 2-3 drops by mouth See admin instructions. CBD Hemp Oil - Place 3 drops in mouth in the morning, place 2 drops in mouth at lunch and dinner, and place 3 drops in mouth at bedtime.   Yes [provider]  potassium chloride (KLOR-CON) 10 MEQ tablet TAKE 1 TABLET(10 MEQ) BY MOUTH TWICE DAILY 09/04/19  Yes Rutherford Guys, MD  rosuvastatin (CRESTOR) 10 MG tablet TAKE 1 TABLET BY MOUTH DAILY 08/24/19  Yes Rutherford Guys, MD  sitaGLIPtin (JANUVIA) 100 MG tablet Take 1 tablet (100 mg total) by mouth daily. 07/31/19  Yes Rutherford Guys, MD  SUMAtriptan (IMITREX) 50 MG tablet TAKE 1 TABLET BY MOUTH EVERY 2 HOURS AS NEEDED FOR MIGRAINE Patient taking differently: Take 50 mg by mouth every 2 (two) hours as needed for migraine.  03/05/17  Yes Wardell Honour, MD  bisacodyl (BISACODYL) 5 MG EC tablet Take 5 mg by mouth daily as needed for moderate constipation.    [provider]  nitroGLYCERIN  (NITROSTAT) 0.4 MG SL tablet Place 1 tablet (0.4 mg total) under the tongue every 5 (five) minutes as needed for up to 25 days for chest pain. 01/09/19 02/03/19  Adrian Prows, MD    Past Medical History:  Diagnosis Date  . Arthritis    DDD, scoliosis, stenosis    . 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 Calcium blood increased   . Cancer (HCC)    skin ca, posterior R ear   (BENIGN)  . Complication of anesthesia  difficulty intubation , anterior larynx, easy mask ventilation  . Coronary artery disease   . Diabetes mellitus without complication ([place])   . Difficult intubation   . Diverticulitis   . Glaucoma   . Headache(784.0)    migraines  . History of kidney stones   . Hyperlipemia   . Hypertension   . 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 Sleep apnea    CPAP still in use q night, & for naps thru out the day, pt. reports that last study was neg for sleep apnea but he still uses the device    . Spinal stenosis   . Ventricular dilatation    Right    Past Surgical History:  Procedure Laterality Date  . APPENDECTOMY  1991  . BOWEL RESECTION  1985  . CARDIAC CATHETERIZATION  2006  . CATARACT EXTRACTION Right   . CHOLECYSTECTOMY  1990  . COLON SURGERY     bowel resection, for diverticulitis   . COLONOSCOPY N/A 06/15/2013   Procedure: COLONOSCOPY;  Surgeon: Beryle Beams, MD;  Location: [facility];  Service: Endoscopy;  Laterality: N/A;  . COLONOSCOPY WITH PROPOFOL N/A 07/01/2018   Procedure: COLONOSCOPY WITH PROPOFOL;  Surgeon: Carol Ada, MD;  Location: [facility];  Service: Endoscopy;  Laterality: N/A;  . CORONARY ANGIOPLASTY     STENT PLACED  . LAMINECTOMY WITH POSTERIOR LATERAL ARTHRODESIS LEVEL 1 N/A 04/09/2017   Procedure: Lumbar TwoThree,Lumbar Three-Four,Lumbar Four-Five Laminectomy with Lumbar Five-Sacral One Posterior lateral fusion;  Surgeon: Eustace Moore, MD;  Location: [facility];  Service: Neurosurgery;   Laterality: N/A;  . LITHOTRIPSY    . POLYPECTOMY  07/01/2018   Procedure: POLYPECTOMY;  Surgeon: Carol Ada, MD;  Location: [facility];  Service: Endoscopy;;  . SPINAL CORD STIMULATOR INSERTION N/A 03/20/2016   Procedure: LUMBAR SPINAL CORD STIMULATOR INSERTION;  Surgeon: Clydell Hakim, MD;  Location: [facility];  Service: Neurosurgery;  Laterality: N/A;  . TONSILLECTOMY    . [place] ?    Social History   Tobacco Use  . Smoking status: Never Smoker  . Smokeless tobacco: Never Used  Substance Use Topics  . Alcohol use: Yes    Alcohol/week: 0.0 standard drinks    Comment: seldom     Family History  Problem Relation Age of Onset  . Heart disease Father   . Heart attack Father   . Heart disease Paternal Grandmother     Review of Systems  Constitutional: Negative for chills and fever.  Respiratory: Negative for cough and shortness of breath.   Cardiovascular: Positive for leg swelling (minimal, intermittent, ankle). Negative for chest pain and palpitations.  Gastrointestinal: Negative for abdominal pain, blood in stool, melena, nausea and vomiting.  Neurological: Positive for tingling.  per hpi   OBJECTIVE:  Today's Vitals   10/10/19 1003  BP: 110/74  Pulse: 65  Temp: 98.6 F (37 C)  SpO2: 93%  Weight: 205 lb (93 kg)  Height: 6' (1.829 m)   Body mass index is 27.8 kg/m.   Physical Exam Vitals signs and nursing note reviewed.  Constitutional:      Appearance: He is well-developed.  HENT:     Head: Normocephalic and atraumatic.  Eyes:     Conjunctiva/sclera: Conjunctivae normal.     Pupils: Pupils are equal, round, and reactive to light.  Neck:     Musculoskeletal: Neck supple.  Cardiovascular:     Rate and Rhythm: Normal rate and regular rhythm.  Heart sounds: No murmur. No friction rub. No gallop.   Pulmonary:     Effort: Pulmonary effort is normal.     Breath sounds: Normal breath sounds. No wheezing or rales.   Musculoskeletal:     Right lower leg: No edema.     Left lower leg: No edema.  Skin:    General: Skin is warm and dry.  Neurological:     Mental Status: He is alert and oriented to person, place, and time.     Diabetic Foot Form - Detailed   Diabetic Foot Exam - detailed Can the patient see the bottom of their feet?: Yes Are the shoes appropriate in style and fit?: Yes Is there swelling or and abnormal foot shape?: No Is there a claw toe deformity?: No Is there elevated skin temparature?: No Is there foot or ankle muscle weakness?: Yes Normal Range of Motion: Yes Semmes-Weinstein Monofilament Test R Site 1-Great Toe: Neg L Site 1-Great Toe: Neg    Comments: Having tingling in the toes and numbness     No results found for this or any previous visit (from the past 24 hour(s)).  No results found.   ASSESSMENT and PLAN  1. Type 2 diabetes mellitus without complication, without long-term current use of insulin ([place]) Checking labs today, medications will be adjusted as needed.  - HM DIABETES FOOT EXAM - Lipid panel - CMET with GFR - Hemoglobin A1c  2. Essential hypertension Controlled. Continue current regime.  - Lipid panel - CMET with GFR  3. Dyslipidemia Checking labs today, medications will be adjusted as needed.   4. Facial flushing Previous workups reassuring, discussed benign nature of flushing associated with eating. R/o food allergies. Patient educational handout given.  - Food Allergy Profile - Allergen Profile, Vegetable II  5. Rectal prolapse Advised patient see GI  Other orders - Omega-3 Fatty Acids (FISH OIL) 1000 MG CAPS; Take by mouth. - Potassium (POTASSIMIN PO); Take by mouth. - Milk Thistle 250 MG CAPS; Take by mouth.  Return in about 6 months (around 04/08/2020).    Rutherford Guys, MD Primary Care at [place], [place] [address] Ph.  [phone] Fax [phone]

## 2019-10-10 NOTE — Patient Instructions (Signed)
° ° ° °  If you have lab work done today you will be contacted with your lab results within the next 2 weeks.  If you have not heard from us then please contact us. The fastest way to get your results is to register for My Chart. ° ° °IF you received an x-ray today, you will receive an invoice from [facility]. Please contact [facility] at [phone] with questions or concerns regarding your invoice.  ° °IF you received labwork today, you will receive an invoice from [facility]. Please contact [facility] at [phone] with questions or concerns regarding your invoice.  ° °Our billing staff will not be able to assist you with questions regarding bills from these companies. ° °You will be contacted with the lab results as soon as they are available. The fastest way to get your results is to activate your My Chart account. Instructions are located on the last page of this paperwork. If you have not heard from us regarding the results in 2 weeks, please contact this office. °  ° ° ° °

## 2019-10-10 NOTE — Addendum Note (Signed)
Addended by: Amalia Hailey on: 10/10/2019 02:33 PM   Modules accepted: Orders

## 2019-10-11 LAB — CMP14+EGFR
ALT: 27 IU/L (ref 0–44)
AST: 21 IU/L (ref 0–40)
Albumin/Globulin Ratio: 2.2 (ref 1.2–2.2)
Albumin: 4.6 g/dL (ref 3.7–4.7)
Alkaline Phosphatase: 68 IU/L (ref 39–117)
BUN/Creatinine Ratio: 23 (ref 10–24)
BUN: 18 mg/dL (ref 8–27)
Bilirubin Total: 0.5 mg/dL (ref 0.0–1.2)
CO2: 24 mmol/L (ref 20–29)
Calcium: 10.9 mg/dL — ABNORMAL HIGH (ref 8.6–10.2)
Chloride: 99 mmol/L (ref 96–106)
Creatinine, Ser: 0.8 mg/dL (ref 0.76–1.27)
GFR calc Af Amer: 102 mL/min/{1.73_m2} (ref >59)
GFR calc non Af Amer: 89 mL/min/{1.73_m2} (ref >59)
Globulin, Total: 2.1 g/dL (ref 1.5–4.5)
Glucose: 148 mg/dL — ABNORMAL HIGH (ref 65–99)
Potassium: 3.6 mmol/L (ref 3.5–5.2)
Sodium: 141 mmol/L (ref 134–144)
Total Protein: 6.7 g/dL (ref 6.0–8.5)

## 2019-10-11 LAB — HEMOGLOBIN A1C
Est. average glucose Bld gHb Est-mCnc: 140 mg/dL
Hgb A1c MFr Bld: 6.5 % — ABNORMAL HIGH (ref 4.8–5.6)

## 2019-10-11 LAB — LIPID PANEL
Chol/HDL Ratio: 4.5 ratio (ref 0.0–5.0)
Cholesterol, Total: 163 mg/dL (ref 100–199)
HDL: 36 mg/dL — ABNORMAL LOW (ref >39)
LDL Chol Calc (NIH): 75 mg/dL (ref 0–99)
Triglycerides: 323 mg/dL — ABNORMAL HIGH (ref 0–149)
VLDL Cholesterol Cal: 52 mg/dL — ABNORMAL HIGH (ref 5–40)

## 2019-10-12 LAB — FOOD ALLERGY PROFILE
Allergen Corn, IgE: <0.1 kU/L
Clam IgE: <0.1 kU/L
Codfish IgE: <0.1 kU/L
Egg White IgE: <0.1 kU/L
Milk IgE: <0.1 kU/L
Peanut IgE: <0.1 kU/L
Scallop IgE: <0.1 kU/L
Sesame Seed IgE: <0.1 kU/L
Shrimp IgE: <0.1 kU/L
Soybean IgE: <0.1 kU/L
Walnut IgE: <0.1 kU/L
Wheat IgE: <0.1 kU/L

## 2019-10-12 LAB — ALLERGEN PROFILE, VEGETABLE II
Allergen Carrot IgE: <0.1 kU/L
Allergen Green Bean IgE: <0.1 kU/L
Allergen Green Pea IgE: <0.1 kU/L
Allergen Onion IgE: <0.1 kU/L
Allergen Potato, White IgE: <0.1 kU/L
Allergen Tomato, IgE: <0.1 kU/L
Kidney Bean IgE: <0.1 kU/L
Pumpkin IgE: <0.1 kU/L

## 2019-10-26 DIAGNOSIS — H401133 Primary open-angle glaucoma, bilateral, severe stage: Secondary | ICD-10-CM | POA: Diagnosis not present

## 2019-11-20 ENCOUNTER — Other Ambulatory Visit: Payer: Self-pay | Admitting: Family Medicine

## 2019-11-20 DIAGNOSIS — E785 Hyperlipidemia, unspecified: Secondary | ICD-10-CM

## 2020-01-04 ENCOUNTER — Other Ambulatory Visit: Payer: Self-pay | Admitting: Cardiology

## 2020-01-10 ENCOUNTER — Encounter: Payer: Self-pay | Admitting: Cardiology

## 2020-01-10 ENCOUNTER — Other Ambulatory Visit: Payer: Self-pay

## 2020-01-10 ENCOUNTER — Ambulatory Visit: Payer: Medicare Other | Admitting: Cardiology

## 2020-01-10 VITALS — BP 130/71 | HR 67 | Temp 97.3°F | Ht 72.0 in | Wt 206.6 lb

## 2020-01-10 DIAGNOSIS — E782 Mixed hyperlipidemia: Secondary | ICD-10-CM | POA: Diagnosis not present

## 2020-01-10 DIAGNOSIS — E119 Type 2 diabetes mellitus without complications: Secondary | ICD-10-CM | POA: Diagnosis not present

## 2020-01-10 DIAGNOSIS — I10 Essential (primary) hypertension: Secondary | ICD-10-CM | POA: Diagnosis not present

## 2020-01-10 DIAGNOSIS — I25118 Atherosclerotic heart disease of native coronary artery with other forms of angina pectoris: Secondary | ICD-10-CM

## 2020-01-10 NOTE — Progress Notes (Signed)
Primary Physician/Referring:  Rutherford Guys, MD  Patient ID: [name], male    DOB: 07/05/1946, 74 y.o.   MRN: [id number]  Chief Complaint  Patient presents with  . Coronary Artery Disease  . Follow-up    1 yr   HPI:    [name]  is a 74 y.o. Caucasian male patient with coronary artery disease and has undergone PCI to the RCA on 07/23/2005 with implantation of a 2.5 x 18 Cypher drug-eluting stent.  He has moderate disease in his proximal LAD.  He also has history of hypertension, hyperlipidemia and diabetes mellitus and obstructive sleep apnea on CPAP and compliant and follows Dr. Rexene Alberts.  He has history of spinal stenosis S/P Laminectomy 03/20/2016 and chronic back pain. He now presents for annual visit.  He has no specific complaints today, continues to have back pain.  Also admits to having gained weight and reduced activity.  He still has exertional angina but has not used sublingual nitroglycerin in the recent past.  Past Medical History:  Diagnosis Date  . Arthritis    DDD, scoliosis, stenosis    . Atherosclerotic heart disease native coronary artery w/angina pectoris ([place])   . Calcium blood increased   . Cancer (HCC)    skin ca, posterior R ear   ([place]EN[place])  . Complication of anesthesia    difficulty intubation , anterior larynx, easy mask ventilation  . Coronary artery disease   . Diabetes mellitus without complication ([place])   . Difficult intubation   . Diverticulitis   . Glaucoma   . Headache(784.0)    migraines  . History of kidney stones   . Hyperlipemia   . Hypertension   . 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 Sleep apnea    CPAP still in use q night, & for naps thru out the day, pt. reports that last study was neg for sleep apnea but he still uses the device    . Spinal stenosis   . Ventricular dilatation    Right   Past Surgical History:  Procedure Laterality Date  . APPENDECTOMY  1991  . BOWEL RESECTION  1985  . CARDIAC  CATHETERIZATION  2006  . CATARACT EXTRACTION Right   . CHOLECYSTECTOMY  1990  . COLON SURGERY     bowel resection, for diverticulitis   . COLONOSCOPY N/A 06/15/2013   Procedure: COLONOSCOPY;  Surgeon: Beryle Beams, MD;  Location: [facility];  Service: Endoscopy;  Laterality: N/A;  . COLONOSCOPY WITH PROPOFOL N/A 07/01/2018   Procedure: COLONOSCOPY WITH PROPOFOL;  Surgeon: Carol Ada, MD;  Location: [facility];  Service: Endoscopy;  Laterality: N/A;  . CORONARY ANGIOPLASTY     STENT PLACED  . LAMINECTOMY WITH POSTERIOR LATERAL ARTHRODESIS LEVEL 1 N/A 04/09/2017   Procedure: Lumbar TwoThree,Lumbar Three-Four,Lumbar Four-Five Laminectomy with Lumbar Five-Sacral One Posterior lateral fusion;  Surgeon: Eustace Moore, MD;  Location: [facility];  Service: Neurosurgery;  Laterality: N/A;  . LITHOTRIPSY    . POLYPECTOMY  07/01/2018   Procedure: POLYPECTOMY;  Surgeon: Carol Ada, MD;  Location: [facility];  Service: Endoscopy;;  . SPINAL CORD STIMULATOR INSERTION N/A 03/20/2016   Procedure: LUMBAR SPINAL CORD STIMULATOR INSERTION;  Surgeon: Clydell Hakim, MD;  Location: [facility];  Service: Neurosurgery;  Laterality: N/A;  . TONSILLECTOMY    . [place] ?   Family History  Problem Relation Age of Onset  . Heart disease Father   . Heart attack  Father   . Heart disease Paternal Grandmother     Social History   Tobacco Use  . Smoking status: Never Smoker  . Smokeless tobacco: Never Used  Substance Use Topics  . Alcohol use: Yes    Alcohol/week: 0.0 standard drinks    Comment: seldom    ROS  Review of Systems  Cardiovascular: Negative for chest pain, dyspnea on exertion and leg swelling.  Musculoskeletal: Positive for arthritis (gout) and back pain (chronic).  Gastrointestinal: Positive for heartburn. Negative for melena.   Objective  Blood pressure 130/71, pulse 67, temperature (!) 97.3 F (36.3 C), height 6' (1.829 m), weight 206 lb 9.6 oz (93.7 kg), SpO2  95 %.  Vitals with BMI 01/10/2020 10/10/2019 07/10/2019  Height 6' 0"  6' 0"  6' 0"   Weight 206 lbs 10 oz 205 lbs 205 lbs  BMI 28.01 96.2 83.6  Systolic 629 476 546  Diastolic 71 74 71  Pulse 67 65 67     Physical Exam  Cardiovascular: Normal rate, regular rhythm, normal heart sounds and intact distal pulses. Exam reveals no gallop.  No murmur heard. Pulses:      Posterior tibial pulses are 1+ on the right side.  No leg edema, no JVD. Except right DP, all pulses 2 plus and normal.   Pulmonary/Chest: Effort normal and breath sounds normal.  Abdominal: Soft. Bowel sounds are normal.   Laboratory examination:   Recent Labs    10/10/19 1106  NA 141  K 3.6  CL 99  CO2 24  GLUCOSE 148*  BUN 18  CREATININE 0.80  CALCIUM 10.9*  GFRNONAA 89  GFRAA 102   CrCl cannot be calculated (Patient's most recent lab result is older than the maximum 21 days allowed.).  CMP Latest Ref Rng & Units 10/10/2019 01/03/2019 08/23/2018  Glucose 65 - 99 mg/dL 148(H) 141(H) 146(H)  BUN 8 - 27 mg/dL 18 19 24   Creatinine 0.76 - 1.27 mg/dL 0.80 0.99 0.90  Sodium 134 - 144 mmol/L 141 147(H) 143  Potassium 3.5 - 5.2 mmol/L 3.6 3.6 3.3(L)  Chloride 96 - 106 mmol/L 99 103 102  CO2 20 - 29 mmol/L 24 26 24   Calcium 8.6 - 10.2 mg/dL 10.9(H) 11.3(H) 10.8(H)  Total Protein 6.0 - 8.5 g/dL 6.7 6.8 6.4  Total Bilirubin 0.0 - 1.2 mg/dL 0.5 0.3 0.3  Alkaline Phos 39 - 117 IU/L 68 65 53  AST 0 - 40 IU/L 21 14 14   ALT 0 - 44 IU/L 27 18 22    CBC Latest Ref Rng & Units 01/03/2019 08/23/2018 02/07/2018  WBC 3.4 - 10.8 x10E3/uL 5.8 6.1 6.4  Hemoglobin 13.0 - 17.7 g/dL 16.2 16.1 16.1  Hematocrit 37.5 - 51.0 % 47.8 46.7 46.4  Platelets 150 - 450 x10E3/uL 194 164 189   Lipid Panel     Component Value Date/Time   CHOL 163 10/10/2019 1106   TRIG 323 (H) 10/10/2019 1106   HDL 36 (L) 10/10/2019 1106   CHOLHDL 4.5 10/10/2019 1106   CHOLHDL 3.8 06/03/2016 1201   VLDL 27 06/03/2016 1201   LDLCALC 75 10/10/2019 1106    LDLDIRECT 68.5 09/27/2008 1008   HEMOGLOBIN A1C Lab Results  Component Value Date   HGBA1C 6.5 (H) 10/10/2019   MPG 134 04/01/2017   TSH No results for input(s): TSH in the last 8760 hours.  Medications and allergies   Allergies  Allergen Reactions  . Amaryl Other (See Comments)    Gives pt migraines  . Other Other (See Comments)  Pistachios, lima beans, and some types of chocolate cause migraines  Nasal corticosteroids causes nose bleeds  . Skelaxin [Metaxalone] Dermatitis and Other (See Comments)    Cherry red rash with sloughing off of skin at left upper chest/shoulder  . Stool Softener [Dss] Other (See Comments)    Causes migraines  . Norco [Hydrocodone-Acetaminophen] Other (See Comments)    Ineffective  . Pistachio Nut (Diagnostic) Other (See Comments)     In  Addition :Lima beans, milk chocolate   . Doc-Q-Lax [Digital Fever Thermometer] Other (See Comments)    Headache   . Levofloxacin Nausea Only  . Lisinopril Cough  . Metformin And Related Diarrhea  Current Outpatient Medications  Medication Instructions  . acetaminophen (TYLENOL) 650 mg, Oral, 5 times daily  . amLODipine (NORVASC) 10 MG tablet TAKE 1/2 TABLET BY MOUTH EVERYDAY  . aspirin 81 mg, Oral, Every evening  . bimatoprost (LUMIGAN) 0.01 % SOLN 1 drop, Both Eyes, Every evening  . brimonidine-timolol (COMBIGAN) 0.2-0.5 % ophthalmic solution 1 drop, 2 times daily  . brinzolamide (AZOPT) 1 % ophthalmic suspension 1 drop, Both Eyes,  2 times daily  . chlorthalidone (HYGROTON) 25 MG tablet TAKE 1 TABLET(25 MG) BY MOUTH DAILY  . diclofenac sodium (VOLTAREN) 4 g, Topical, 4 times daily  . empagliflozin (JARDIANCE) 25 mg, Oral, Daily  . FREESTYLE LITE test strip USE TO TEST BLOOD SUGAR ONCE DAILY  . Lancets (FREESTYLE) lancets Use to test blood sugar once daily  . Magnesium 125 MG CAPS Oral, Every 2-3 days   . metoprolol tartrate (LOPRESSOR) 25 MG tablet TAKE 1 TABLET BY MOUTH TWICE DAILY  . Milk Thistle 250 MG CAPS Oral  . Multiple Vitamins-Minerals (PRESERVISION AREDS 2) CAPS 1 capsule, Oral, 2 times daily  . nitroGLYCERIN (NITROSTAT) 0.4 mg, Sublingual, Every 5 min PRN  . Omega-3 Fatty Acids (FISH OIL) 1000 MG CAPS Oral  . OVER THE COUNTER MEDICATION 1,000 mg, Oral, Daily, Microlactin 1000 mg Supplement  . OVER THE COUNTER MEDICATION 2-3 drops, Oral, See admin instructions, CBD Hemp Oil - Place 3 drops in mouth in the morning, place 2 drops in mouth at lunch and dinner, and place 3 drops in mouth at bedtime.  . Potassium (POTASSIMIN PO) Oral  . potassium chloride (KLOR-CON) 10 MEQ tablet TAKE 1 TABLET(10 MEQ) BY MOUTH TWICE DAILY  . rosuvastatin (CRESTOR) 10 MG tablet TAKE 1 TABLET BY MOUTH DAILY  . sitaGLIPtin (JANUVIA) 100 mg, Oral, Daily  . SUMAtriptan (IMITREX) 50 MG tablet TAKE 1 TABLET BY MOUTH EVERY 2 HOURS AS NEEDED FOR MIGRAINE   Radiology:   No results found.  Cardiac Studies:   Coronary angiogram 07/23/2005 PTCA/Stent Mid RCA 2.5 x 18 Cypher drug-eluting stent. Moderate disease in proximal LAD.  Exercise Myoview stress test 11/27/2013: 1. Resting EKG showed normal sinus rhythm, poor R wave progression, non-specific ST-depression in inferior leads, cannot R/O ischemia. Stress EKG was equivocal for ischemia. There was 1.5 mm additional up-sloping ST depression noted at peak exercise, which resolved at > 2 minutes into recovery. Patient exercised on BRUCE PROTOCOL for 7 minutes 1 seconds. The maximum work level  achieved was 7.6 MET's. The test was terminated due to achievement of the target heart rate. 2. Perfusion imaging study demonstrated no evidence of ischemia. The right ventricle was prominent and suggests RV dilatation/hypertrophy. Dynamic gated images reveal normal wall motion and endocardial thickening. Left ventricular ejection fraction was estimated to be 73%. This is a low risk study.  Abdominal Aortic Duplex [08/13/2015]: Diffuse plaque noted in the proximal aorta. Focal plaque noted in the mid and distal aorta. mild ectasia noted in the abdominal aorta. No AAA observed. No significant change from 01/24/15, previously noted AAA is probably ectasia.  Echocardiogram 01/11/2018: Left ventricle cavity is normal in size. Mild concentric hypertrophy of the left ventricle.  Normal global wall motion. Doppler evidence of grade I (impaired) diastolic dysfunction, normal LAP. Calculated EF 58%. Left atrial cavity is mildly dilated. Mild (Grade I) mitral regurgitation. Mild chordal SAM. Mild to moderate tricuspid regurgitation. Estimated pulmonary artery systolic pressure 29  mmHg. Mild pulmonic regurgitation. Compared to prior study in 2015, RV function has improved, PASP has decreased.  EKG 01/10/2020: Normal sinus rhythm with rate of 64 bpm, leftward enlargement, normal axis.  Poor R wave progression, cannot exclude anteroseptal infarct old.  Nonspecific ST-T abnormality, cannot exclude inferior ischemia.   No significant change from  EKG 01/09/2019 last year   Assessment     ICD-10-CM   1. Atherosclerosis of native coronary artery of native heart with stable angina pectoris ([place])  I25.118 EKG 12-Lead  2. Essential hypertension  I10   3. Type 2 diabetes mellitus without complication, without long-term current use of insulin (HCC)  E11.9   4. Mixed hyperlipidemia  E78.2       No orders of the defined types were placed in this encounter.   Medications Discontinued During This Encounter   Medication Reason  . naproxen sodium (ALEVE) 220 MG tablet Error     Recommendations:   [name]  is a 74 y.o. Caucasian male patient with coronary artery disease and has undergone PCI to the RCA on 07/23/2005 with implantation of a 2.5 x 18 Cypher drug-eluting stent.  He has moderate disease in his proximal LAD.  He also has history of hypertension, hyperlipidemia and diabetes mellitus and obstructive sleep apnea on CPAP and compliant and follows Dr. Rexene Alberts.  He has history of spinal stenosis S/P Laminectomy 03/20/2016 and chronic back pain.  Angina pectoris remained stable.  He continues to have uncontrolled hypertriglyceridemia/mixed hyperlipidemia, mostly related to weight gain and excessive calorie intake.  He is already on maximum dose of over-the-counter fish oil capsules, but this is causing him to have GERD and heartburn.  Discussed regarding weight loss and avoidance of excess calories.  This should help with improvement in triglycerides otherwise we may have to consider addition of Vascepa. DM is controlled.   Blood pressure is well controlled, vascular examination is normal, I will see him back in a year or sooner if problems.  Adrian Prows, MD, [facility] 01/10/2020, 11:56 AM [facility] [place] Office: [phone]

## 2020-01-31 ENCOUNTER — Ambulatory Visit (INDEPENDENT_AMBULATORY_CARE_PROVIDER_SITE_OTHER): Payer: Medicare Other | Admitting: Emergency Medicine

## 2020-01-31 VITALS — BP 120/70 | Ht 72.0 in | Wt 207.0 lb

## 2020-01-31 DIAGNOSIS — Z0000 Encounter for general adult medical examination without abnormal findings: Secondary | ICD-10-CM | POA: Diagnosis not present

## 2020-01-31 NOTE — Progress Notes (Signed)
Presents today for [employer] Visit   Date of last exam: 10/10/2019  Interpreter used for this visit?no I connected with  [name] on 01/31/20 by a telephone enabled telemedicine application and verified that I am speaking with the correct person using two identifiers.   I discussed the limitations of evaluation and management by telemedicine. The patient expressed understanding and agreed to proceed.    Patient Care Team: Tony Guys, MD as PCP - General (Family Medicine) Tony Haber, MD (Family Medicine) Tony Haber, MD (Family Medicine) Tony Prows, MD as Consulting Physician (Cardiology)   Other items to address today: Last Dental last week crown work Last Eye 12/2019 covid vaccines 2 pfizer    Other Screening: Last screening for diabetes: 10/11/19 Last lipid screening: 10/11/19  ADVANCE DIRECTIVES: Discussed: yes On File: no Materials Provided: no  Immunization status:  Immunization History  Administered Date(s) Administered  . Fluad Quad(high Dose 65+) 07/10/2019  . Influenza, High Dose Seasonal PF 08/23/2018  . Influenza, Seasonal, Injecte, Preservative Fre 02/02/2013  . Influenza,inj,Quad PF,6+ Mos 09/05/2013, 11/22/2014, 07/25/2015, 08/29/2016, 08/10/2017  . PFIZER SARS-COV-2 Vaccination 01/03/2020, 01/18/2020  . Pneumococcal Conjugate-13 11/22/2014  . Pneumococcal Polysaccharide-23 06/03/2016  . Zoster 11/22/2014     Health Maintenance Due  Topic Date Due  . URINE MICROALBUMIN  03/12/2020     Functional Status Survey: Is the patient deaf or have difficulty hearing?: No Does the patient have difficulty seeing, even when wearing glasses/contacts?: No(acute glaucoma) Does the patient have difficulty concentrating, remembering, or making decisions?: Yes(concentration decline per pt) Does the patient have difficulty walking or climbing stairs?: No Does the patient have difficulty dressing or bathing?:  No Does the patient have difficulty doing errands alone such as visiting a doctor's office or shopping?: No   6CIT Screen 01/31/2020 08/23/2018  What Year? 0 points 0 points  What month? 0 points 0 points  What time? 0 points 0 points  Count back from 20 0 points 0 points  Months in reverse 0 points 0 points  Repeat phrase 0 points 2 points  Total Score 0 2        Clinical Support from 01/31/2020 in [name]  AUDIT-C Score  0       Home Environment: 2 story home lives with wife and puppy  grap bars yes Lighting yes  Clutter yes Scatter rugs yes      Patient Active Problem List   Diagnosis Date Noted  . Laboratory examination 01/09/2019  . 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 S/P lumbar spinal fusion 04/09/2017  . Type 2 diabetes mellitus without complication, without long-term current use of insulin ([name]) 02/14/2016  . Degenerative disc disease, lumbar 02/14/2016  . HTN (hypertension) 02/22/2012  . Obstructive sleep apnea 10/14/2010  . Migraine headache 10/14/2010  . GLAUCOMA 10/14/2010  . Coronary atherosclerosis 10/14/2010  . DIVERTICULAR DISEASE 10/14/2010     Past Medical History:  Diagnosis Date  . Arthritis    DDD, scoliosis, stenosis    . Atherosclerotic heart disease native coronary artery w/angina pectoris ([place])   . Calcium blood increased   . Cancer (HCC)    skin ca, posterior R ear   ([place])  . Complication of anesthesia    difficulty intubation , anterior larynx, easy mask ventilation  . Coronary artery disease   . Diabetes mellitus without complication ([place])   . Difficult intubation   . Diverticulitis   . Glaucoma   . Headache(784.0)  migraines  . History of kidney stones   . Hyperlipemia   . Hypertension   . 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 Sleep apnea    CPAP still in use q night, & for naps thru out the day, pt. reports that last study was neg for sleep apnea but he still uses the device     . Spinal stenosis   . Ventricular dilatation    Right     Past Surgical History:  Procedure Laterality Date  . APPENDECTOMY  1991  . BOWEL RESECTION  1985  . CARDIAC CATHETERIZATION  2006  . CATARACT EXTRACTION Right   . CHOLECYSTECTOMY  1990  . COLON SURGERY     bowel resection, for diverticulitis   . COLONOSCOPY N/A 06/15/2013   Procedure: COLONOSCOPY;  Surgeon: Beryle Beams, MD;  Location: [facility];  Service: Endoscopy;  Laterality: N/A;  . COLONOSCOPY WITH PROPOFOL N/A 07/01/2018   Procedure: COLONOSCOPY WITH PROPOFOL;  Surgeon: Carol Ada, MD;  Location: [facility];  Service: Endoscopy;  Laterality: N/A;  . CORONARY ANGIOPLASTY     STENT PLACED  . LAMINECTOMY WITH POSTERIOR LATERAL ARTHRODESIS LEVEL 1 N/A 04/09/2017   Procedure: Lumbar TwoThree,Lumbar Three-Four,Lumbar Four-Five Laminectomy with Lumbar Five-Sacral One Posterior lateral fusion;  Surgeon: Eustace Moore, MD;  Location: [facility];  Service: Neurosurgery;  Laterality: N/A;  . LITHOTRIPSY    . POLYPECTOMY  07/01/2018   Procedure: POLYPECTOMY;  Surgeon: Carol Ada, MD;  Location: [facility];  Service: Endoscopy;;  . SPINAL CORD STIMULATOR INSERTION N/A 03/20/2016   Procedure: LUMBAR SPINAL CORD STIMULATOR INSERTION;  Surgeon: Clydell Hakim, MD;  Location: [facility];  Service: Neurosurgery;  Laterality: N/A;  . TONSILLECTOMY    . [place] ?     Family History  Problem Relation Age of Onset  . Heart disease Father   . Heart attack Father   . Heart disease Paternal Grandmother      Social History   Socioeconomic History  . Marital status: Married    Spouse name: Not on file  . Number of children: 0  . Years of education: Masters +  . Highest education level: Not on file  Occupational History  . Occupation: Retired  Tobacco Use  . Smoking status: Never Smoker  . Smokeless tobacco: Never Used  Substance and Sexual Activity  . Alcohol use: Yes    Alcohol/week: 0.0  standard drinks    Comment: seldom   . Drug use: No  . Sexual activity: Yes  Other Topics Concern  . Not on file  Social History Narrative   Marital status: married x 1969      Children: none      Lives: with wife, 1 dog      Employment: retired at age 65.  Education music      Tobacco: none      Alcohol: rare      Exercise: stationary bike in 2018; rare use in 2018 due to sick dog.      ADLs: independent with ADLs; drives      Advanced Directives:    2 cups of coffee a day, soda or tea about 3 times a week.    Social Determinants of Health   Financial Resource Strain:   . Difficulty of Paying Living Expenses:   Food Insecurity:   . Worried About Charity fundraiser in the Last Year:   . [place] in the Last  Year:   Transportation Needs:   . Film/video editor (Medical):   Marland Kitchen Lack of Transportation (Non-Medical):   Physical Activity:   . Days of Exercise per Week:   . Minutes of Exercise per Session:   Stress:   . Feeling of Stress :   Social Connections:   . Frequency of Communication with Friends and Family:   . Frequency of Social Gatherings with Friends and Family:   . Attends Religious Services:   . Active Member of Clubs or Organizations:   . Attends Archivist Meetings:   Marland Kitchen Marital Status:   Intimate Partner Violence:   . Fear of Current or Ex-Partner:   . Emotionally Abused:   Marland Kitchen Physically Abused:   . Sexually Abused:      Allergies  Allergen Reactions  . Amaryl Other (See Comments)    Gives pt migraines  . Other Other (See Comments)    Pistachios, lima beans, and some types of chocolate cause migraines  Nasal corticosteroids causes nose bleeds  . Skelaxin [Metaxalone] Dermatitis and Other (See Comments)    Cherry red rash with sloughing off of skin at left upper chest/shoulder  . Stool Softener [Dss] Other (See Comments)    Causes migraines  . Norco [Hydrocodone-Acetaminophen] Other (See Comments)    Ineffective  . Pistachio  Nut (Diagnostic) Other (See Comments)     In  Addition :Lima beans, milk chocolate   . Doc-Q-Lax [Digital Fever Thermometer] Other (See Comments)    Headache   . Levofloxacin Nausea Only  . Lisinopril Cough  . Metformin And Related Diarrhea     Prior to Admission medications   Medication Sig Start Date End Date Taking? Authorizing Provider  acetaminophen (TYLENOL) 650 MG CR tablet Take 650 mg by mouth 5 (five) times daily.    Yes [provider]  amLODipine (NORVASC) 10 MG tablet TAKE 1/2 TABLET BY MOUTH EVERYDAY 08/04/19  Yes Tony Guys, MD  aspirin 81 MG tablet Take 81 mg by mouth every evening.    Yes [provider]  bimatoprost (LUMIGAN) 0.01 % SOLN Place 1 drop into both eyes every evening.   Yes [provider]  brimonidine-timolol (COMBIGAN) 0.2-0.5 % ophthalmic solution Place 1 drop into both eyes 2 (two) times daily.   Yes [provider]  brinzolamide (AZOPT) 1 % ophthalmic suspension Place 1 drop into both eyes 2 (two) times daily.    Yes [provider]  chlorthalidone (HYGROTON) 25 MG tablet TAKE 1 TABLET(25 MG) BY MOUTH DAILY 11/20/19  Yes Tony Guys, MD  empagliflozin (JARDIANCE) 25 MG TABS tablet Take 25 mg by mouth daily. 07/31/19  Yes Tony Guys, MD  FREESTYLE LITE test strip USE TO TEST BLOOD SUGAR ONCE DAILY 07/21/19  Yes Delia Chimes A, MD  Lancets (FREESTYLE) lancets Use to test blood sugar once daily 07/26/19  Yes Tony Guys, MD  Magnesium 125 MG CAPS Take by mouth. Every 2-3 days   Yes [provider]  metoprolol tartrate (LOPRESSOR) 25 MG tablet TAKE 1 TABLET BY MOUTH TWICE DAILY 01/05/20  Yes Tony Prows, MD  Milk Thistle 250 MG CAPS Take by mouth.   Yes [provider]  Multiple Vitamins-Minerals (PRESERVISION AREDS 2) CAPS Take 1 capsule by mouth 2 (two) times daily.   Yes [provider]  Omega-3 Fatty Acids (FISH OIL) 1000 MG CAPS Take by mouth.   Yes [provider]  OVER THE COUNTER MEDICATION Take 1,000 mg by  mouth daily. Microlactin 1000 mg Supplement   Yes [provider]  OVER THE COUNTER MEDICATION Take 2-3 drops by mouth See admin instructions. CBD Hemp Oil - Place 3 drops in mouth in the morning, place 2 drops in mouth at lunch and dinner, and place 3 drops in mouth at bedtime.   Yes [provider]  potassium chloride (KLOR-CON) 10 MEQ tablet TAKE 1 TABLET(10 MEQ) BY MOUTH TWICE DAILY 11/20/19  Yes Tony Guys, MD  rosuvastatin (CRESTOR) 10 MG tablet TAKE 1 TABLET BY MOUTH DAILY 11/20/19  Yes Tony Guys, MD  sitaGLIPtin (JANUVIA) 100 MG tablet Take 1 tablet (100 mg total) by mouth daily. 07/31/19  Yes Tony Guys, MD  diclofenac sodium (VOLTAREN) 1 % GEL Apply 4 g topically 4 (four) times daily. Patient not taking: Reported on 01/31/2020 01/03/19   Tony Guys, MD  nitroGLYCERIN (NITROSTAT) 0.4 MG SL tablet Place 1 tablet (0.4 mg total) under the tongue every 5 (five) minutes as needed for up to 25 days for chest pain. Patient not taking: Reported on 01/31/2020 01/09/19 01/10/20  Tony Prows, MD  Potassium (POTASSIMIN PO) Take by mouth.    [provider]  SUMAtriptan (IMITREX) 50 MG tablet TAKE 1 TABLET BY MOUTH EVERY 2 HOURS AS NEEDED FOR MIGRAINE Patient not taking: No sig reported 03/05/17   Wardell Honour, MD     Depression screen [facility] 2/9 01/31/2020 10/10/2019 07/10/2019 03/09/2019 01/03/2019  Decreased Interest 0 0 0 0 0  Down, Depressed, Hopeless 0 0 0 0 0  PHQ - 2 Score 0 0 0 0 0  Altered sleeping - - - - 0  Tired, decreased energy - - - - 0  Change in appetite - - - - 0  Feeling bad or failure about yourself  - - - - 0  Trouble concentrating - - - - 0  Moving slowly or fidgety/restless - - - - 0  Suicidal thoughts - - - - 0  PHQ-9 Score - - - - 0  Difficult doing work/chores - - - - Not difficult at all     Fall Risk  01/31/2020 10/10/2019 07/10/2019 03/09/2019 01/03/2019  Falls in the past year?  0 1 0 0 1  Number falls in past yr: 0 1 0 0 0  Injury with Fall? 0 1 0 0 0  Risk for fall due to : - - - - -  Follow up Falls evaluation completed;Education provided - - - -      PHYSICAL EXAM: BP 120/70 Comment: per patient  Ht 6' (1.829 m)   Wt 207 lb (93.9 kg) Comment: per pt  BMI 28.07 kg/m    Wt Readings from Last 3 Encounters:  01/31/20 207 lb (93.9 kg)  01/10/20 206 lb 9.6 oz (93.7 kg)  10/10/19 205 lb (93 kg)     No exam data present      Education/Counseling provided regarding diet and exercise, prevention of chronic diseases, smoking/tobacco cessation, if applicable, and reviewed "Covered Medicare Preventive Services."   ASSESSMENT/PLAN: There are no diagnoses linked to this encounter.

## 2020-01-31 NOTE — Patient Instructions (Signed)
Thank you for taking time to come for your Medicare Wellness Visit. I appreciate your ongoing commitment to your health goals. Please review the following plan we discussed and let me know if I can assist you in the future.  Leroy Kennedy LPN  Preventive Care 64 Years and Older, Male Preventive care refers to lifestyle choices and visits with your health care provider that can promote health and wellness. This includes:  A yearly physical exam. This is also called an annual well check.  Regular dental and eye exams.  Immunizations.  Screening for certain conditions.  Healthy lifestyle choices, such as diet and exercise. What can I expect for my preventive care visit? Physical exam Your health care provider will check:  Height and weight. These may be used to calculate body mass index (BMI), which is a measurement that tells if you are at a healthy weight.  Heart rate and blood pressure.  Your skin for abnormal spots. Counseling Your health care provider may ask you questions about:  Alcohol, tobacco, and drug use.  Emotional well-being.  Home and relationship well-being.  Sexual activity.  Eating habits.  History of falls.  Memory and ability to understand (cognition).  Work and work Statistician. What immunizations do I need?  Influenza (flu) vaccine  This is recommended every year. Tetanus, diphtheria, and pertussis (Tdap) vaccine  You may need a Td booster every 10 years. Varicella (chickenpox) vaccine  You may need this vaccine if you have not already been vaccinated. Zoster (shingles) vaccine  You may need this after age 66. Pneumococcal conjugate (PCV13) vaccine  One dose is recommended after age 84. Pneumococcal polysaccharide (PPSV23) vaccine  One dose is recommended after age 88. Measles, mumps, and rubella (MMR) vaccine  You may need at least one dose of MMR if you were born in 1957 or later. You may also need a second dose. Meningococcal  conjugate (MenACWY) vaccine  You may need this if you have certain conditions. Hepatitis A vaccine  You may need this if you have certain conditions or if you travel or work in places where you may be exposed to hepatitis A. Hepatitis B vaccine  You may need this if you have certain conditions or if you travel or work in places where you may be exposed to hepatitis B. Haemophilus influenzae type b (Hib) vaccine  You may need this if you have certain conditions. You may receive vaccines as individual doses or as more than one vaccine together in one shot (combination vaccines). Talk with your health care provider about the risks and benefits of combination vaccines. What tests do I need? Blood tests  Lipid and cholesterol levels. These may be checked every 5 years, or more frequently depending on your overall health.  Hepatitis C test.  Hepatitis B test. Screening  Lung cancer screening. You may have this screening every year starting at age 24 if you have a 30-pack-year history of smoking and currently smoke or have quit within the past 15 years.  Colorectal cancer screening. All adults should have this screening starting at age 52 and continuing until age 61. Your health care provider may recommend screening at age 57 if you are at increased risk. You will have tests every 1-10 years, depending on your results and the type of screening test.  Prostate cancer screening. Recommendations will vary depending on your family history and other risks.  Diabetes screening. This is done by checking your blood sugar (glucose) after you have not eaten for  a while (fasting). You may have this done every 1-3 years.  Abdominal aortic aneurysm (AAA) screening. You may need this if you are a current or former smoker.  Sexually transmitted disease (STD) testing. Follow these instructions at home: Eating and drinking  Eat a diet that includes fresh fruits and vegetables, whole grains, lean  protein, and low-fat dairy products. Limit your intake of foods with high amounts of sugar, saturated fats, and salt.  Take vitamin and mineral supplements as recommended by your health care provider.  Do not drink alcohol if your health care provider tells you not to drink.  If you drink alcohol: ? Limit how much you have to 0-2 drinks a day. ? Be aware of how much alcohol is in your drink. In the [place] one drink equals one 12 oz bottle of beer (355 mL), one 5 oz glass of wine (148 mL), or one 1 oz glass of hard liquor (44 mL). Lifestyle  Take daily care of your teeth and gums.  Stay active. Exercise for at least 30 minutes on 5 or more days each week.  Do not use any products that contain nicotine or tobacco, such as cigarettes, e-cigarettes, and chewing tobacco. If you need help quitting, ask your health care provider.  If you are sexually active, practice safe sex. Use a condom or other form of protection to prevent STIs (sexually transmitted infections).  Talk with your health care provider about taking a low-dose aspirin or statin. What's next?  Visit your health care provider once a year for a well check visit.  Ask your health care provider how often you should have your eyes and teeth checked.  Stay up to date on all vaccines. This information is not intended to replace advice given to you by your health care provider. Make sure you discuss any questions you have with your health care provider. Document Revised: 10/27/2018 Document Reviewed: 10/27/2018 Elsevier Patient Education  2020 [employer]  

## 2020-02-18 ENCOUNTER — Other Ambulatory Visit: Payer: Self-pay | Admitting: Family Medicine

## 2020-02-18 DIAGNOSIS — E785 Hyperlipidemia, unspecified: Secondary | ICD-10-CM

## 2020-02-18 NOTE — Telephone Encounter (Signed)
Requested Prescriptions  Pending Prescriptions Disp Refills  . amLODipine (NORVASC) 10 MG tablet [Pharmacy Med Name: AMLODIPINE BESYLATE 10MG  TABLETS] 90 tablet 0    Sig: TAKE 1/2 TABLET BY MOUTH EVERY DAY     Cardiovascular:  Calcium Channel Blockers Passed - 02/18/2020  7:57 AM      Passed - Last BP in normal range    BP Readings from Last 1 Encounters:  01/31/20 120/70         Passed - Valid encounter within last 6 months    Recent Outpatient Visits          2 weeks ago Encounter for [employer] annual wellness exam   Primary Care at [facility], [place], [place]   4 months ago Type 2 diabetes mellitus without complication, without long-term current use of insulin [facility])   [facility] at [facility], Lilia Argue, MD   7 months ago Nasal obstruction   Primary Care at [facility], Lilia Argue, MD   11 months ago Dyslipidemia   Primary Care at [facility], Lilia Argue, MD   11 months ago Type 2 diabetes mellitus without complication, without long-term current use of insulin [facility])   Primary Care at [facility], Lilia Argue, MD      Future Appointments            In 1 month Rutherford Guys, MD Primary Care at [place], [facility]   In 10 months Adrian Prows, MD [facility]           . potassium chloride (KLOR-CON) 10 MEQ tablet [Pharmacy Med Name: POTASSIUM CL 10MEQ ER TABLETS] 180 tablet 0    Sig: TAKE 1 TABLET(10 MEQ) BY MOUTH TWICE DAILY     Endocrinology:  Minerals - Potassium Supplementation Passed - 02/18/2020  7:57 AM      Passed - K in normal range and within 360 days    Potassium  Date Value Ref Range Status  10/10/2019 3.6 3.5 - 5.2 mmol/L Final         Passed - Cr in normal range and within 360 days    Creat  Date Value Ref Range Status  08/29/2016 0.93 0.70 - 1.18 mg/dL Final    Comment:      For patients > or = 74 years of age: The upper reference limit for Creatinine is approximately 13% higher for people identified as African-American.       Creatinine, Ser  Date Value Ref Range Status  10/10/2019 0.80 0.76 - 1.27 mg/dL Final         Passed - Valid encounter within last 12 months    Recent Outpatient Visits          2 weeks ago Encounter for Medicare annual wellness exam   Primary Care at [facility], [place], [place]   4 months ago Type 2 diabetes mellitus without complication, without long-term current use of insulin [facility])   Primary Care at [facility], Lilia Argue, MD   7 months ago Nasal obstruction   Primary Care at [facility], Lilia Argue, MD   11 months ago Dyslipidemia   Primary Care at [facility], Lilia Argue, MD   11 months ago Type 2 diabetes mellitus without complication, without long-term current use of insulin [facility])   Primary Care at [facility], Lilia Argue, MD      Future Appointments            In 1 month Rutherford Guys, MD Primary Care  at [place], [place]   In 10 months Adrian Prows, MD [facility]           Marland Kitchen chlorthalidone (HYGROTON) 25 MG tablet [Pharmacy Med Name: CHLORTHALIDONE 25MG  TABLETS] 90 tablet 0    Sig: TAKE 1 TABLET(25 MG) BY MOUTH DAILY     Cardiovascular: Diuretics - Thiazide Failed - 02/18/2020  7:57 AM      Failed - Ca in normal range and within 360 days    Calcium  Date Value Ref Range Status  10/10/2019 10.9 (H) 8.6 - 10.2 mg/dL Final   Calcium, Total (PTH)  Date Value Ref Range Status  06/15/2012 11.2 (H) 8.4 - 10.5 mg/dL Final   Calcium, Ion  Date Value Ref Range Status  12/05/2014 1.25 1.13 - 1.30 mmol/L Final         Passed - Cr in normal range and within 360 days    Creat  Date Value Ref Range Status  08/29/2016 0.93 0.70 - 1.18 mg/dL Final    Comment:      For patients > or = 74 years of age: The upper reference limit for Creatinine is approximately 13% higher for people identified as African-American.      Creatinine, Ser  Date Value Ref Range Status  10/10/2019 0.80 0.76 - 1.27 mg/dL Final         Passed - K in normal range and  within 360 days    Potassium  Date Value Ref Range Status  10/10/2019 3.6 3.5 - 5.2 mmol/L Final         Passed - Na in normal range and within 360 days    Sodium  Date Value Ref Range Status  10/10/2019 141 134 - 144 mmol/L Final         Passed - Last BP in normal range    BP Readings from Last 1 Encounters:  01/31/20 120/70         Passed - Valid encounter within last 6 months    Recent Outpatient Visits          2 weeks ago Encounter for [employer] annual wellness exam   Primary Care at [place], [place], [place]   4 months ago Type 2 diabetes mellitus without complication, without long-term current use of insulin [facility])   Primary Care at [facility], Lilia Argue, MD   7 months ago Nasal obstruction   Primary Care at [facility], Lilia Argue, MD   11 months ago Dyslipidemia   Primary Care at [facility], Lilia Argue, MD   11 months ago Type 2 diabetes mellitus without complication, without long-term current use of insulin [facility])   Primary Care at [facility], Lilia Argue, MD      Future Appointments            In 1 month Rutherford Guys, MD Primary Care at [place], [facility]   In 10 months Adrian Prows, MD [facility]           . rosuvastatin (CRESTOR) 10 MG tablet [Pharmacy Med Name: ROSUVASTATIN 10MG  TABLETS] 90 tablet 0    Sig: TAKE 1 TABLET BY MOUTH DAILY     Cardiovascular:  Antilipid - Statins Failed - 02/18/2020  7:57 AM      Failed - HDL in normal range and within 360 days    HDL  Date Value Ref Range Status  10/10/2019 36 (L) >39 mg/dL Final         Failed - Triglycerides in  normal range and within 360 days    Triglycerides  Date Value Ref Range Status  10/10/2019 323 (H) 0 - 149 mg/dL Final         Passed - Total Cholesterol in normal range and within 360 days    Cholesterol, Total  Date Value Ref Range Status  10/10/2019 163 100 - 199 mg/dL Final         Passed - LDL in normal range and within 360 days    LDL Chol Calc (NIH)  Date  Value Ref Range Status  10/10/2019 75 0 - 99 mg/dL Final   Direct LDL  Date Value Ref Range Status  09/27/2008 68.5 mg/dL Final    Comment:    See lab report for associated comment(s)         Passed - Patient is not pregnant      Passed - Valid encounter within last 12 months    Recent Outpatient Visits          2 weeks ago Encounter for [employer] annual wellness exam   Primary Care at [facility], [place], MD   4 months ago Type 2 diabetes mellitus without complication, without long-term current use of insulin [facility])   Primary Care at [facility], Lilia Argue, MD   7 months ago Nasal obstruction   Primary Care at [facility], Lilia Argue, MD   11 months ago Dyslipidemia   Primary Care at [facility], Lilia Argue, MD   11 months ago Type 2 diabetes mellitus without complication, without long-term current use of insulin [facility])   Primary Care at [facility], Lilia Argue, MD      Future Appointments            In 1 month Rutherford Guys, MD Primary Care at [place], [facility]   In 10 months Adrian Prows, MD [facility]

## 2020-03-18 NOTE — Telephone Encounter (Signed)
No action required.

## 2020-04-05 ENCOUNTER — Telehealth: Payer: Self-pay | Admitting: Family Medicine

## 2020-04-05 ENCOUNTER — Other Ambulatory Visit: Payer: Self-pay

## 2020-04-05 DIAGNOSIS — E119 Type 2 diabetes mellitus without complications: Secondary | ICD-10-CM

## 2020-04-05 DIAGNOSIS — Z125 Encounter for screening for malignant neoplasm of prostate: Secondary | ICD-10-CM

## 2020-04-05 DIAGNOSIS — E785 Hyperlipidemia, unspecified: Secondary | ICD-10-CM

## 2020-04-05 NOTE — Telephone Encounter (Signed)
I have called pt back and got clarification on what he needed. Pt wanted to have a have a nurse visit early morning the day before. I have rescheduled the pt for the f.u visit with Dr. Pamella Pert and made the nurse visit early am for pt.   Lab Orders in for Future.   He stated understanding.

## 2020-04-05 NOTE — Progress Notes (Signed)
I have called pt back and got clarification on what he needed. Pt wanted to have a have a nurse visit early morning the day before. I have rescheduled the pt for the f.u visit with Dr. Pamella Pert and made the nurse visit early am for pt.   He stated understanding.

## 2020-04-05 NOTE — Telephone Encounter (Signed)
Pt called his state his need a blood test his need to know went can his have a blood test as well a app His cancer his app For [facility] 24/21 in till his know if can have a blood test hung up Before I can answer his Questions .please Advice

## 2020-04-08 ENCOUNTER — Ambulatory Visit: Payer: Medicare Other

## 2020-04-08 ENCOUNTER — Other Ambulatory Visit: Payer: Self-pay

## 2020-04-08 DIAGNOSIS — E785 Hyperlipidemia, unspecified: Secondary | ICD-10-CM | POA: Diagnosis not present

## 2020-04-08 DIAGNOSIS — E119 Type 2 diabetes mellitus without complications: Secondary | ICD-10-CM

## 2020-04-09 ENCOUNTER — Ambulatory Visit: Payer: Medicare Other | Admitting: Family Medicine

## 2020-04-09 ENCOUNTER — Other Ambulatory Visit: Payer: Self-pay | Admitting: Registered Nurse

## 2020-04-09 ENCOUNTER — Ambulatory Visit (INDEPENDENT_AMBULATORY_CARE_PROVIDER_SITE_OTHER): Payer: Medicare Other | Admitting: Family Medicine

## 2020-04-09 ENCOUNTER — Encounter: Payer: Self-pay | Admitting: Family Medicine

## 2020-04-09 VITALS — BP 117/67 | HR 60 | Temp 97.7°F | Ht 72.0 in | Wt 205.0 lb

## 2020-04-09 DIAGNOSIS — E785 Hyperlipidemia, unspecified: Secondary | ICD-10-CM

## 2020-04-09 DIAGNOSIS — L989 Disorder of the skin and subcutaneous tissue, unspecified: Secondary | ICD-10-CM | POA: Diagnosis not present

## 2020-04-09 DIAGNOSIS — E119 Type 2 diabetes mellitus without complications: Secondary | ICD-10-CM | POA: Diagnosis not present

## 2020-04-09 DIAGNOSIS — I10 Essential (primary) hypertension: Secondary | ICD-10-CM

## 2020-04-09 LAB — CMP14+EGFR
ALT: 18 IU/L (ref 0–44)
AST: 15 IU/L (ref 0–40)
Albumin/Globulin Ratio: 2.4 — ABNORMAL HIGH (ref 1.2–2.2)
Albumin: 4.5 g/dL (ref 3.7–4.7)
Alkaline Phosphatase: 56 IU/L (ref 48–121)
BUN/Creatinine Ratio: 22 (ref 10–24)
BUN: 20 mg/dL (ref 8–27)
Bilirubin Total: 0.6 mg/dL (ref 0.0–1.2)
CO2: 24 mmol/L (ref 20–29)
Calcium: 10.7 mg/dL — ABNORMAL HIGH (ref 8.6–10.2)
Chloride: 100 mmol/L (ref 96–106)
Creatinine, Ser: 0.89 mg/dL (ref 0.76–1.27)
GFR calc Af Amer: 97 mL/min/{1.73_m2} (ref >59)
GFR calc non Af Amer: 84 mL/min/{1.73_m2} (ref >59)
Globulin, Total: 1.9 g/dL (ref 1.5–4.5)
Glucose: 143 mg/dL — ABNORMAL HIGH (ref 65–99)
Potassium: 3.5 mmol/L (ref 3.5–5.2)
Sodium: 143 mmol/L (ref 134–144)
Total Protein: 6.4 g/dL (ref 6.0–8.5)

## 2020-04-09 LAB — LIPID PANEL
Chol/HDL Ratio: 4.5 ratio (ref 0.0–5.0)
Cholesterol, Total: 162 mg/dL (ref 100–199)
HDL: 36 mg/dL — ABNORMAL LOW (ref >39)
LDL Chol Calc (NIH): 88 mg/dL (ref 0–99)
Triglycerides: 223 mg/dL — ABNORMAL HIGH (ref 0–149)
VLDL Cholesterol Cal: 38 mg/dL (ref 5–40)

## 2020-04-09 LAB — MICROALBUMIN / CREATININE URINE RATIO
Creatinine, Urine: 73.7 mg/dL
Microalb/Creat Ratio: 14 mg/g creat (ref 0–29)
Microalbumin, Urine: 10.4 ug/mL

## 2020-04-09 LAB — HEMOGLOBIN A1C
Est. average glucose Bld gHb Est-mCnc: 146 mg/dL
Hgb A1c MFr Bld: 6.7 % — ABNORMAL HIGH (ref 4.8–5.6)

## 2020-04-09 MED ORDER — ROSUVASTATIN CALCIUM 20 MG PO TABS: 20.0000 mg | ORAL_TABLET | Freq: Every day | ORAL | 3 refills | Status: AC

## 2020-04-09 NOTE — Progress Notes (Signed)
5/25/202111:20 AM  [name] [birth year], 74 y.o., male [id number]  Chief Complaint  Patient presents with  . Diabetes  . Hyperlipidemia  . left ear scab    red spot on red cheek not healing x 1 yr    HPI:   Patient is a 74 y.o. male with past medical history significant for DM2, HLP, HTN, OSA, DDD of lumbar spine s/p fusion,who presents today for routine followup  Last oV nov 2020 - no changes Saw ophtho dec 2020 - for glaucoma Saw cards feb 2021 - for CAD- no changes  H/o kidney stones - limited dietary intake of dark greens Exercise limited by chronic back pain  Left ear and left check 2 skin lesions that have not healed in over a year Has tried sporadic application of 5-fluoroucil wo resolution Last derm was too long ago, he does not remember practice  Lab Results  Component Value Date   HGBA1C 6.7 (H) 04/08/2020   HGBA1C 6.5 (H) 10/10/2019   HGBA1C 6.4 (H) 03/13/2019   Lab Results  Component Value Date   MICROALBUR 0.5 06/03/2016   LDLCALC 88 04/08/2020   CREATININE 0.89 04/08/2020     Depression screen PHQ 2/9 04/09/2020 01/31/2020 10/10/2019  Decreased Interest 0 0 0  Down, Depressed, Hopeless 0 0 0  PHQ - 2 Score 0 0 0  Altered sleeping - - -  Tired, decreased energy - - -  Change in appetite - - -  Feeling bad or failure about yourself  - - -  Trouble concentrating - - -  Moving slowly or fidgety/restless - - -  Suicidal thoughts - - -  PHQ-9 Score - - -  Difficult doing work/chores - - -    Fall Risk  04/09/2020 01/31/2020 10/10/2019 07/10/2019 03/09/2019  Falls in the past year? 0 0 1 0 0  Number falls in past yr: 0 0 1 0 0  Injury with Fall? 0 0 1 0 0  Risk for fall due to : - - - - -  Follow up Falls evaluation completed Falls evaluation completed;Education provided - - -     Allergies  Allergen Reactions  . Amaryl Other (See Comments)    Gives pt migraines  . Other Other (See Comments)    Pistachios, lima beans, and some types  of chocolate cause migraines  Nasal corticosteroids causes nose bleeds  . Skelaxin [Metaxalone] Dermatitis and Other (See Comments)    Cherry red rash with sloughing off of skin at left upper chest/shoulder  . Stool Softener [Dss] Other (See Comments)    Causes migraines  . Norco [Hydrocodone-Acetaminophen] Other (See Comments)    Ineffective  . Pistachio Nut (Diagnostic) Other (See Comments)     In  Addition :Lima beans, milk chocolate   . Doc-Q-Lax [Digital Fever Thermometer] Other (See Comments)    Headache   . Levofloxacin Nausea Only  . Lisinopril Cough  . Metformin And Related Diarrhea    Prior to Admission medications   Medication Sig Start Date End Date Taking? Authorizing Provider  acetaminophen (TYLENOL) 650 MG CR tablet Take 650 mg by mouth 5 (five) times daily.    Yes [provider]  amLODipine (NORVASC) 10 MG tablet TAKE 1/2 TABLET BY MOUTH EVERY DAY 02/18/20  Yes Rutherford Guys, MD  aspirin 81 MG tablet Take 81 mg by mouth every evening.    Yes [provider]  bimatoprost (LUMIGAN) 0.01 % SOLN Place 1 drop into both eyes every  evening.   Yes [provider]  brimonidine-timolol (COMBIGAN) 0.2-0.5 % ophthalmic solution Place 1 drop into both eyes 2 (two) times daily.   Yes [provider]  brinzolamide (AZOPT) 1 % ophthalmic suspension Place 1 drop into both eyes 2 (two) times daily.    Yes [provider]  chlorthalidone (HYGROTON) 25 MG tablet TAKE 1 TABLET(25 MG) BY MOUTH DAILY 02/18/20  Yes Rutherford Guys, MD  diclofenac sodium (VOLTAREN) 1 % GEL Apply 4 g topically 4 (four) times daily. 01/03/19  Yes Rutherford Guys, MD  empagliflozin (JARDIANCE) 25 MG TABS tablet Take 25 mg by mouth daily. 07/31/19  Yes Rutherford Guys, MD  FREESTYLE LITE test strip USE TO TEST BLOOD SUGAR ONCE DAILY 07/21/19  Yes Delia Chimes A, MD  Lancets (FREESTYLE) lancets Use to test blood sugar once daily 07/26/19  Yes Rutherford Guys, MD   Magnesium 125 MG CAPS Take by mouth. Every 2-3 days   Yes [provider]  metoprolol tartrate (LOPRESSOR) 25 MG tablet TAKE 1 TABLET BY MOUTH TWICE DAILY 01/05/20  Yes Adrian Prows, MD  Milk Thistle 250 MG CAPS Take by mouth.   Yes [provider]  Multiple Vitamins-Minerals (PRESERVISION AREDS 2) CAPS Take 1 capsule by mouth 2 (two) times daily.   Yes [provider]  Omega-3 Fatty Acids (FISH OIL) 1000 MG CAPS Take by mouth.   Yes [provider]  OVER THE COUNTER MEDICATION Take 1,000 mg by mouth daily. Microlactin 1000 mg Supplement   Yes [provider]  OVER THE COUNTER MEDICATION Take 2-3 drops by mouth See admin instructions. CBD Hemp Oil - Place 3 drops in mouth in the morning, place 2 drops in mouth at lunch and dinner, and place 3 drops in mouth at bedtime.   Yes [provider]  Potassium (POTASSIMIN PO) Take by mouth.   Yes [provider]  potassium chloride (KLOR-CON) 10 MEQ tablet TAKE 1 TABLET(10 MEQ) BY MOUTH TWICE DAILY 02/18/20  Yes Rutherford Guys, MD  rosuvastatin (CRESTOR) 10 MG tablet TAKE 1 TABLET BY MOUTH DAILY 02/18/20  Yes Rutherford Guys, MD  sitaGLIPtin (JANUVIA) 100 MG tablet Take 1 tablet (100 mg total) by mouth daily. 07/31/19  Yes Rutherford Guys, MD  SUMAtriptan (IMITREX) 50 MG tablet TAKE 1 TABLET BY MOUTH EVERY 2 HOURS AS NEEDED FOR MIGRAINE 03/05/17  Yes Wardell Honour, MD  nitroGLYCERIN (NITROSTAT) 0.4 MG SL tablet Place 1 tablet (0.4 mg total) under the tongue every 5 (five) minutes as needed for up to 25 days for chest pain. Patient not taking: Reported on 01/31/2020 01/09/19 01/10/20  Adrian Prows, MD    Past Medical History:  Diagnosis Date  . Arthritis    DDD, scoliosis, stenosis    . 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 Calcium blood increased   . Cancer (HCC)    skin ca, posterior R ear   (BENIGN)  . Complication of anesthesia    difficulty  intubation , anterior larynx, easy mask ventilation  . Coronary artery disease   . Diabetes mellitus without complication ([place])   . Difficult intubation   . Diverticulitis   . Glaucoma   . Headache(784.0)    migraines  . History of kidney stones   . Hyperlipemia   . Hypertension   . 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 Sleep apnea    CPAP still in use q night, & for naps  thru out the day, pt. reports that last study was neg for sleep apnea but he still uses the device    . Spinal stenosis   . Ventricular dilatation    Right    Past Surgical History:  Procedure Laterality Date  . APPENDECTOMY  1991  . BOWEL RESECTION  1985  . CARDIAC CATHETERIZATION  2006  . CATARACT EXTRACTION Right   . CHOLECYSTECTOMY  1990  . COLON SURGERY     bowel resection, for diverticulitis   . COLONOSCOPY N/A 06/15/2013   Procedure: COLONOSCOPY;  Surgeon: Beryle Beams, MD;  Location: [facility];  Service: Endoscopy;  Laterality: N/A;  . COLONOSCOPY WITH PROPOFOL N/A 07/01/2018   Procedure: COLONOSCOPY WITH PROPOFOL;  Surgeon: Carol Ada, MD;  Location: [facility];  Service: Endoscopy;  Laterality: N/A;  . CORONARY ANGIOPLASTY     STENT PLACED  . LAMINECTOMY WITH POSTERIOR LATERAL ARTHRODESIS LEVEL 1 N/A 04/09/2017   Procedure: Lumbar TwoThree,Lumbar Three-Four,Lumbar Four-Five Laminectomy with Lumbar Five-Sacral One Posterior lateral fusion;  Surgeon: Eustace Moore, MD;  Location: [facility];  Service: Neurosurgery;  Laterality: N/A;  . LITHOTRIPSY    . POLYPECTOMY  07/01/2018   Procedure: POLYPECTOMY;  Surgeon: Carol Ada, MD;  Location: [facility];  Service: Endoscopy;;  . SPINAL CORD STIMULATOR INSERTION N/A 03/20/2016   Procedure: LUMBAR SPINAL CORD STIMULATOR INSERTION;  Surgeon: Clydell Hakim, MD;  Location: [facility];  Service: Neurosurgery;  Laterality: N/A;  . TONSILLECTOMY    . [place] ?    Social History   Tobacco Use  . Smoking status:  Never Smoker  . Smokeless tobacco: Never Used  Substance Use Topics  . Alcohol use: Yes    Alcohol/week: 0.0 standard drinks    Comment: seldom     Family History  Problem Relation Age of Onset  . Heart disease Father   . Heart attack Father   . Heart disease Paternal Grandmother     Review of Systems  Constitutional: Negative for chills and fever.  Respiratory: Negative for cough and shortness of breath.   Cardiovascular: Negative for chest pain, palpitations and leg swelling.  Gastrointestinal: Negative for abdominal pain, nausea and vomiting.  per hpi   OBJECTIVE:  Today's Vitals   04/09/20 1116  BP: 117/67  Pulse: 60  Temp: 97.7 F (36.5 C)  SpO2: 100%  Weight: 205 lb (93 kg)  Height: 6' (1.829 m)   Body mass index is 27.8 kg/m.   Physical Exam Vitals and nursing note reviewed.  Constitutional:      Appearance: He is well-developed.  HENT:     Head: Normocephalic and atraumatic.  Eyes:     Conjunctiva/sclera: Conjunctivae normal.     Pupils: Pupils are equal, round, and reactive to light.  Cardiovascular:     Rate and Rhythm: Normal rate and regular rhythm.     Heart sounds: No murmur. No friction rub. No gallop.   Pulmonary:     Effort: Pulmonary effort is normal.     Breath sounds: Normal breath sounds. No wheezing or rales.  Musculoskeletal:     Cervical back: Neck supple.  Skin:    General: Skin is warm and dry.     Comments: Left ear helix, a 31mm erythematous scaly ill defined macule  Left check a 59mm erythematous macule  Neurological:     Mental Status: He is alert and oriented to person, place, and time.     No results found  for this or any previous visit (from the past 24 hour(s)).  No results found.   ASSESSMENT and PLAN  1. Hyperlipidemia LDL goal <70 Not at goal for CAD. Increase crestor to 20mg . Continue working on LFM - rosuvastatin (CRESTOR) 20 MG tablet; Take 1 tablet (20 mg total) by mouth daily.  2. Type 2 diabetes  mellitus without complication, without long-term current use of insulin ([place]) At goal, continue working on LFM  3. Essential hypertension Controlled. Continue current regime.   4. Hypercalcemia - PTH, Intact and Calcium - Vitamin D, 25-hydroxy  5. Skin lesion - Ambulatory referral to Dermatology  Return in about 6 months (around 10/10/2020).    Rutherford Guys, MD Primary Care at [place], [place] [address] Ph.  [phone] Fax [phone]

## 2020-04-09 NOTE — Patient Instructions (Signed)
° ° ° °  If you have lab work done today you will be contacted with your lab results within the next 2 weeks.  If you have not heard from us then please contact us. The fastest way to get your results is to register for My Chart. ° ° °IF you received an x-ray today, you will receive an invoice from [facility]. Please contact [facility] at [phone] with questions or concerns regarding your invoice.  ° °IF you received labwork today, you will receive an invoice from [facility]. Please contact [facility] at [phone] with questions or concerns regarding your invoice.  ° °Our billing staff will not be able to assist you with questions regarding bills from these companies. ° °You will be contacted with the lab results as soon as they are available. The fastest way to get your results is to activate your My Chart account. Instructions are located on the last page of this paperwork. If you have not heard from us regarding the results in 2 weeks, please contact this office. °  ° ° ° °

## 2020-04-10 LAB — VITAMIN D 25 HYDROXY (VIT D DEFICIENCY, FRACTURES): Vit D, 25-Hydroxy: 21.9 ng/mL — ABNORMAL LOW (ref 30.0–100.0)

## 2020-04-10 LAB — PTH, INTACT AND CALCIUM
Calcium: 11.1 mg/dL — ABNORMAL HIGH (ref 8.6–10.2)
PTH: 90 pg/mL — ABNORMAL HIGH (ref 15–65)

## 2020-04-10 NOTE — Progress Notes (Signed)
FYI - these got routed to me.

## 2020-04-11 ENCOUNTER — Other Ambulatory Visit: Payer: Self-pay | Admitting: Family Medicine

## 2020-04-11 ENCOUNTER — Other Ambulatory Visit: Payer: Self-pay | Admitting: Cardiology

## 2020-04-11 DIAGNOSIS — E213 Hyperparathyroidism, unspecified: Secondary | ICD-10-CM

## 2020-04-23 DIAGNOSIS — Z961 Presence of intraocular lens: Secondary | ICD-10-CM | POA: Diagnosis not present

## 2020-04-23 DIAGNOSIS — H50332 Intermittent monocular exotropia, left eye: Secondary | ICD-10-CM | POA: Diagnosis not present

## 2020-04-23 DIAGNOSIS — H401133 Primary open-angle glaucoma, bilateral, severe stage: Secondary | ICD-10-CM | POA: Diagnosis not present

## 2020-04-23 DIAGNOSIS — H2512 Age-related nuclear cataract, left eye: Secondary | ICD-10-CM | POA: Diagnosis not present

## 2020-05-18 ENCOUNTER — Other Ambulatory Visit: Payer: Self-pay | Admitting: Family Medicine

## 2020-05-18 DIAGNOSIS — E785 Hyperlipidemia, unspecified: Secondary | ICD-10-CM

## 2020-05-18 NOTE — Telephone Encounter (Signed)
Requested Prescriptions  Pending Prescriptions Disp Refills   rosuvastatin (CRESTOR) 10 MG tablet [Pharmacy Med Name: ROSUVASTATIN 10MG  TABLETS] 90 tablet 0    Sig: TAKE 1 TABLET BY MOUTH DAILY     Cardiovascular:  Antilipid - Statins Failed - 05/18/2020  7:05 AM      Failed - LDL in normal range and within 360 days    LDL Chol Calc (NIH)  Date Value Ref Range Status  04/08/2020 88 0 - 99 mg/dL Final   Direct LDL  Date Value Ref Range Status  09/27/2008 68.5 mg/dL Final    Comment:    See lab report for associated comment(s)         Failed - HDL in normal range and within 360 days    HDL  Date Value Ref Range Status  04/08/2020 36 (L) >39 mg/dL Final         Failed - Triglycerides in normal range and within 360 days    Triglycerides  Date Value Ref Range Status  04/08/2020 223 (H) 0 - 149 mg/dL Final         Passed - Total Cholesterol in normal range and within 360 days    Cholesterol, Total  Date Value Ref Range Status  04/08/2020 162 100 - 199 mg/dL Final         Passed - Patient is not pregnant      Passed - Valid encounter within last 12 months    Recent Outpatient Visits          1 month ago Hyperlipidemia LDL goal <70   [facility] at [facility], Lilia Argue, MD   3 months ago Encounter for [employer] annual wellness exam   Primary Care at [place], [place], MD   7 months ago Type 2 diabetes mellitus without complication, without long-term current use of insulin ([place])   Primary Care at [facility], Lilia Argue, MD   10 months ago Nasal obstruction   Primary Care at [facility], Lilia Argue, MD   1 year ago Dyslipidemia   Primary Care at [facility], Lilia Argue, MD      Future Appointments            In 2 months [place], [place], [place] [facility], [place]   In 4 months Rutherford Guys, MD Primary Care at [place], [facility]   In 7 months Adrian Prows, MD [facility]            chlorthalidone (HYGROTON) 25 MG  tablet [Pharmacy Med Name: CHLORTHALIDONE 25MG  TABLETS] 90 tablet 0    Sig: TAKE 1 TABLET(25 MG) BY MOUTH DAILY     Cardiovascular: Diuretics - Thiazide Failed - 05/18/2020  7:05 AM      Failed - Ca in normal range and within 360 days    Calcium  Date Value Ref Range Status  04/09/2020 11.1 (H) 8.6 - 10.2 mg/dL Final   Calcium, Total (PTH)  Date Value Ref Range Status  06/15/2012 11.2 (H) 8.4 - 10.5 mg/dL Final   Calcium, Ion  Date Value Ref Range Status  12/05/2014 1.25 1.13 - 1.30 mmol/L Final         Passed - Cr in normal range and within 360 days    Creat  Date Value Ref Range Status  08/29/2016 0.93 0.70 - 1.18 mg/dL Final    Comment:      For patients > or = 74 years of age: The upper reference limit for Creatinine  is approximately 13% higher for people identified as African-American.      Creatinine, Ser  Date Value Ref Range Status  04/08/2020 0.89 0.76 - 1.27 mg/dL Final         Passed - K in normal range and within 360 days    Potassium  Date Value Ref Range Status  04/08/2020 3.5 3.5 - 5.2 mmol/L Final         Passed - Na in normal range and within 360 days    Sodium  Date Value Ref Range Status  04/08/2020 143 134 - 144 mmol/L Final         Passed - Last BP in normal range    BP Readings from Last 1 Encounters:  04/09/20 117/67         Passed - Valid encounter within last 6 months    Recent Outpatient Visits          1 month ago Hyperlipidemia LDL goal <70   Primary Care at [facility], Lilia Argue, MD   3 months ago Encounter for [employer] annual wellness exam   Primary Care at [facility], [place], MD   7 months ago Type 2 diabetes mellitus without complication, without long-term current use of insulin [facility])   Primary Care at [facility], Lilia Argue, MD   10 months ago Nasal obstruction   Primary Care at [facility], Lilia Argue, MD   1 year ago Dyslipidemia   Primary Care at [facility], Lilia Argue, MD      Future Appointments             In 2 months [place], [facility] [place], [place]   In 4 months Rutherford Guys, MD Primary Care at [place], [facility]   In 7 months Adrian Prows, MD [facility]

## 2020-05-27 ENCOUNTER — Encounter: Payer: Self-pay | Admitting: Endocrinology

## 2020-05-27 ENCOUNTER — Ambulatory Visit (INDEPENDENT_AMBULATORY_CARE_PROVIDER_SITE_OTHER): Payer: Medicare Other | Admitting: Endocrinology

## 2020-05-27 ENCOUNTER — Other Ambulatory Visit: Payer: Self-pay

## 2020-05-27 DIAGNOSIS — I25118 Atherosclerotic heart disease of native coronary artery with other forms of angina pectoris: Secondary | ICD-10-CM

## 2020-05-27 MED ORDER — CHLORTHALIDONE 25 MG PO TABS: 12.5000 mg | ORAL_TABLET | Freq: Every day | ORAL | 1 refills | Status: DC

## 2020-05-27 NOTE — Progress Notes (Signed)
Subjective:    Patient ID: [name], male    DOB: October 18, 1946, 74 y.o.   MRN: [id number]  HPI Pt is referred by Dr Pamella Pert, for hypercalcemia.  Pt was noted to have hypercalcemia in 2011. He has never had osteoporosis, thyroid probs, sarcoidosis, cancer, PUD, pancreatitis, recent severe depression.  only bony fracture was right hand.  He does not take A supplement.  Pt denies taking antacids, Li++.   He has h/o urolithiasis.  he takes vit-D, 4000 units/d (since recent labs).  He has chronic arthralgias and slight numbness of the toes. Past Medical History:  Diagnosis Date  . Arthritis    DDD, scoliosis, stenosis    . 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 Calcium blood increased   . Cancer (HCC)    skin ca, posterior R ear   (BENIGN)  . Complication of anesthesia    difficulty intubation , anterior larynx, easy mask ventilation  . Coronary artery disease   . Diabetes mellitus without complication ([place])   . Difficult intubation   . Diverticulitis   . Glaucoma   . Headache(784.0)    migraines  . History of kidney stones   . Hyperlipemia   . Hypertension   . 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 Sleep apnea    CPAP still in use q night, & for naps thru out the day, pt. reports that last study was neg for sleep apnea but he still uses the device    . Spinal stenosis   . Ventricular dilatation    Right    Past Surgical History:  Procedure Laterality Date  . APPENDECTOMY  1991  . BOWEL RESECTION  1985  . CARDIAC CATHETERIZATION  2006  . CATARACT EXTRACTION Right   . CHOLECYSTECTOMY  1990  . COLON SURGERY     bowel resection, for diverticulitis   . COLONOSCOPY N/A 06/15/2013   Procedure: COLONOSCOPY;  Surgeon: Beryle Beams, MD;  Location: [facility];  Service: Endoscopy;  Laterality: N/A;  . COLONOSCOPY WITH PROPOFOL N/A 07/01/2018   Procedure: COLONOSCOPY WITH PROPOFOL;  Surgeon: Carol Ada, MD;  Location:  [facility];  Service: Endoscopy;  Laterality: N/A;  . CORONARY ANGIOPLASTY     STENT PLACED  . LAMINECTOMY WITH POSTERIOR LATERAL ARTHRODESIS LEVEL 1 N/A 04/09/2017   Procedure: Lumbar TwoThree,Lumbar Three-Four,Lumbar Four-Five Laminectomy with Lumbar Five-Sacral One Posterior lateral fusion;  Surgeon: Eustace Moore, MD;  Location: [place];  Service: Neurosurgery;  Laterality: N/A;  . LITHOTRIPSY    . POLYPECTOMY  07/01/2018   Procedure: POLYPECTOMY;  Surgeon: Carol Ada, MD;  Location: [facility];  Service: Endoscopy;;  . SPINAL CORD STIMULATOR INSERTION N/A 03/20/2016   Procedure: LUMBAR SPINAL CORD STIMULATOR INSERTION;  Surgeon: Clydell Hakim, MD;  Location: [facility];  Service: Neurosurgery;  Laterality: N/A;  . TONSILLECTOMY    . [place] ?    Social History   Socioeconomic History  . Marital status: Married    Spouse name: Not on file  . Number of children: 0  . Years of education: Masters +  . Highest education level: Not on file  Occupational History  . Occupation: Retired  Tobacco Use  . Smoking status: Never Smoker  . Smokeless tobacco: Never Used  Vaping Use  . Vaping Use: Never used  Substance and Sexual Activity  . Alcohol use: Yes    Alcohol/week: 0.0 standard drinks    Comment:  seldom   . Drug use: No  . Sexual activity: Yes  Other Topics Concern  . Not on file  Social History Narrative   Marital status: married x 1969      Children: none      Lives: with wife, 1 dog      Employment: retired at age 65.  Education music      Tobacco: none      Alcohol: rare      Exercise: stationary bike in 2018; rare use in 2018 due to sick dog.      ADLs: independent with ADLs; drives      Advanced Directives:    2 cups of coffee a day, soda or tea about 3 times a week.    Social Determinants of Health   Financial Resource Strain:   . Difficulty of Paying Living Expenses:   Food Insecurity:   . Worried About Charity fundraiser in the  Last Year:   . Arboriculturist in the Last Year:   Transportation Needs:   . Film/video editor (Medical):   Marland Kitchen Lack of Transportation (Non-Medical):   Physical Activity:   . Days of Exercise per Week:   . Minutes of Exercise per Session:   Stress:   . Feeling of Stress :   Social Connections:   . Frequency of Communication with Friends and Family:   . Frequency of Social Gatherings with Friends and Family:   . Attends Religious Services:   . Active Member of Clubs or Organizations:   . Attends Archivist Meetings:   Marland Kitchen Marital Status:   Intimate Partner Violence:   . Fear of Current or Ex-Partner:   . Emotionally Abused:   Marland Kitchen Physically Abused:   . Sexually Abused:     Current Outpatient Medications on File Prior to Visit  Medication Sig Dispense Refill  . acetaminophen (TYLENOL) 650 MG CR tablet Take 650 mg by mouth 5 (five) times daily.     Marland Kitchen amLODipine (NORVASC) 10 MG tablet TAKE 1/2 TABLET BY MOUTH EVERY DAY 90 tablet 0  . aspirin 81 MG tablet Take 81 mg by mouth every evening.     . bimatoprost (LUMIGAN) 0.01 % SOLN Place 1 drop into both eyes every evening.    . brimonidine-timolol (COMBIGAN) 0.2-0.5 % ophthalmic solution Place 1 drop into both eyes 2 (two) times daily.    . brinzolamide (AZOPT) 1 % ophthalmic suspension Place 1 drop into both eyes 2 (two) times daily.     . empagliflozin (JARDIANCE) 25 MG TABS tablet Take 25 mg by mouth daily. 90 tablet 3  . metoprolol tartrate (LOPRESSOR) 25 MG tablet TAKE 1 TABLET BY MOUTH TWICE DAILY 180 tablet 3  . rosuvastatin (CRESTOR) 20 MG tablet Take 1 tablet (20 mg total) by mouth daily. 90 tablet 3  . sitaGLIPtin (JANUVIA) 100 MG tablet Take 1 tablet (100 mg total) by mouth daily. 90 tablet 3  . SUMAtriptan (IMITREX) 50 MG tablet TAKE 1 TABLET BY MOUTH EVERY 2 HOURS AS NEEDED FOR MIGRAINE 9 tablet 3  . diclofenac sodium (VOLTAREN) 1 % GEL Apply 4 g topically 4 (four) times daily. 100 g 5  . FREESTYLE LITE test strip  USE TO TEST BLOOD SUGAR ONCE DAILY 100 strip 3  . Lancets (FREESTYLE) lancets Use to test blood sugar once daily 100 each 12  . Magnesium 125 MG CAPS Take by mouth. Every 2-3 days    . Milk Thistle 250 MG  CAPS Take by mouth.    . Multiple Vitamins-Minerals (PRESERVISION AREDS 2) CAPS Take 1 capsule by mouth 2 (two) times daily.    . nitroGLYCERIN (NITROSTAT) 0.4 MG SL tablet Place 1 tablet (0.4 mg total) under the tongue every 5 (five) minutes as needed for up to 25 days for chest pain. (Patient not taking: Reported on 01/31/2020) 25 tablet 3  . Omega-3 Fatty Acids (FISH OIL) 1000 MG CAPS Take by mouth.    Marland Kitchen OVER THE COUNTER MEDICATION Take 1,000 mg by mouth daily. Microlactin 1000 mg Supplement    . OVER THE COUNTER MEDICATION Take 2-3 drops by mouth See admin instructions. CBD Hemp Oil - Place 3 drops in mouth in the morning, place 2 drops in mouth at lunch and dinner, and place 3 drops in mouth at bedtime.    . Potassium (POTASSIMIN PO) Take by mouth.    . potassium chloride (KLOR-CON) 10 MEQ tablet TAKE 1 TABLET(10 MEQ) BY MOUTH TWICE DAILY 180 tablet 0   No current facility-administered medications on file prior to visit.    Allergies  Allergen Reactions  . Amaryl Other (See Comments)    Gives pt migraines  . Other Other (See Comments)    Pistachios, lima beans, and some types of chocolate cause migraines  Nasal corticosteroids causes nose bleeds  . Skelaxin [Metaxalone] Dermatitis and Other (See Comments)    Cherry red rash with sloughing off of skin at left upper chest/shoulder  . Stool Softener [Dss] Other (See Comments)    Causes migraines  . Norco [Hydrocodone-Acetaminophen] Other (See Comments)    Ineffective  . Pistachio Nut (Diagnostic) Other (See Comments)     In  Addition :Lima beans, milk chocolate   . Doc-Q-Lax [Digital Fever Thermometer] Other (See Comments)    Headache   . Levofloxacin Nausea Only  . Lisinopril Cough  . Metformin And Related Diarrhea    Family  History  Problem Relation Age of Onset  . Heart disease Father   . Heart attack Father   . Heart disease Paternal Grandmother   . Hypercalcemia Neg Hx     BP 126/74   Pulse 68   Temp (!) 97.2 F (36.2 C)   Resp 16   Ht 6' (1.829 m)   Wt 201 lb (91.2 kg)   SpO2 97%   BMI 27.26 kg/m      Review of Systems He denies polyuria, muscle cramps, weight change, and muscle weakness.      Objective:   Physical Exam VS: see vs page GEN: no distress HEAD: head: no deformity eyes: no periorbital swelling, no proptosis external nose and ears are normal NECK: supple, thyroid is not enlarged CHEST WALL: no deformity.  No kyphosis.   LUNGS: clear to auscultation CV: reg rate and rhythm, no murmur.  MUSCULOSKELETAL: muscle bulk and strength are grossly normal.  no obvious joint swelling.  gait is normal and steady EXTEMITIES: no deformity.  trace bilat leg edema PULSES: no carotid bruit NEURO:  cn 2-12 grossly intact.   readily moves all 4's.  sensation is intact to touch on all 4's SKIN:  Normal texture and temperature.  No rash or suspicious lesion is visible.   NODES:  None palpable at the neck PSYCH: alert, well-oriented.  Does not appear anxious nor depressed.  25-OH vit-D=22  Lab Results  Component Value Date   PTH 90 (H) 04/09/2020   PTH Comment 04/09/2020   CALCIUM 11.1 (H) 04/09/2020   CAION 1.25 12/05/2014  I have reviewed outside records, and summarized: Pt was noted to have elevated Ca++, and referred here.  HTN was well-controlled on chlorthalidone.       Assessment & Plan:  Hypercalcemia, new to me: could be due to chlorthalidone or PTH Hyperparathyroidism: could be primary, or due to Vit-D.   Patient Instructions  Please reduce the chlorthalidone to 1/2 pill per day, and: Take vitamin-D, 4000 units/day. Please come back for a follow-up appointment in 6 weeks.

## 2020-05-27 NOTE — Patient Instructions (Signed)
Please reduce the chlorthalidone to 1/2 pill per day, and: Take vitamin-D, 4000 units/day. Please come back for a follow-up appointment in 6 weeks.

## 2020-07-05 ENCOUNTER — Other Ambulatory Visit: Payer: Self-pay | Admitting: Family Medicine

## 2020-07-09 ENCOUNTER — Other Ambulatory Visit: Payer: Self-pay

## 2020-07-09 ENCOUNTER — Encounter: Payer: Self-pay | Admitting: Endocrinology

## 2020-07-09 ENCOUNTER — Ambulatory Visit (INDEPENDENT_AMBULATORY_CARE_PROVIDER_SITE_OTHER): Payer: Medicare Other | Admitting: Endocrinology

## 2020-07-09 DIAGNOSIS — I25118 Atherosclerotic heart disease of native coronary artery with other forms of angina pectoris: Secondary | ICD-10-CM | POA: Diagnosis not present

## 2020-07-09 LAB — MAGNESIUM: Magnesium: 2.1 mg/dL (ref 1.5–2.5)

## 2020-07-09 LAB — VITAMIN D 25 HYDROXY (VIT D DEFICIENCY, FRACTURES): VITD: 26.67 ng/mL — ABNORMAL LOW (ref 30.00–100.00)

## 2020-07-09 NOTE — Patient Instructions (Addendum)
Blood tests are requested for you today.  We'll let you know about the results.   Our goals are normal vitamin-D, calcium, and blood pressure.   Please come back for a follow-up appointment in 6 weeks.

## 2020-07-09 NOTE — Progress Notes (Signed)
Subjective:    Patient ID: [name], male    DOB: January 10, 1946, 74 y.o.   MRN: [id number]  HPI Pt returns for f/u of hypercalcemia and hyperparathyroidism (dx'ed 2011; he has never had osteoporosis; only bony fracture was right hand; he has h/o urolithiasis.  he takes vit-D, 4000 units/d; plan is to rx Vit-D def and reduce chlorthalidone). He takes vit-D, 4000 units/d.  He takes OTC Mg++, 450 mg per day Past Medical History:  Diagnosis Date  . Arthritis    DDD, scoliosis, stenosis    . 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 Calcium blood increased   . Cancer (HCC)    skin ca, posterior R ear   ([facility])  . Complication of anesthesia    difficulty intubation , anterior larynx, easy mask ventilation  . Coronary artery disease   . Diabetes mellitus without complication ([place])   . Difficult intubation   . Diverticulitis   . Glaucoma   . Headache(784.0)    migraines  . History of kidney stones   . Hyperlipemia   . Hypertension   . 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 Sleep apnea    CPAP still in use q night, & for naps thru out the day, pt. reports that last study was neg for sleep apnea but he still uses the device    . Spinal stenosis   . Ventricular dilatation    Right    Past Surgical History:  Procedure Laterality Date  . APPENDECTOMY  1991  . BOWEL RESECTION  1985  . CARDIAC CATHETERIZATION  2006  . CATARACT EXTRACTION Right   . CHOLECYSTECTOMY  1990  . COLON SURGERY     bowel resection, for diverticulitis   . COLONOSCOPY N/A 06/15/2013   Procedure: COLONOSCOPY;  Surgeon: Beryle Beams, MD;  [facility];  Service: Endoscopy;  Laterality: N/A;  . COLONOSCOPY WITH PROPOFOL N/A 07/01/2018   Procedure: COLONOSCOPY WITH PROPOFOL;  Surgeon: Carol Ada, MD;  Location[facility];  Service: Endoscopy;  Laterality: N/A;  . CORONARY ANGIOPLASTY     STENT PLACED  . LAMINECTOMY WITH POSTERIOR LATERAL  ARTHRODESIS LEVEL 1 N/A 04/09/2017   Procedure: Lumbar TwoThree,Lumbar Three-Four,Lumbar Four-Five Laminectomy with Lumbar Five-Sacral One Posterior lateral fusion;  Surgeon: Eustace Moore, MD;  Location: [place];  Service: Neurosurgery;  Laterality: N/A;  . LITHOTRIPSY    . POLYPECTOMY  07/01/2018   Procedure: POLYPECTOMY;  Surgeon: Carol Ada, MD;  Location: [facility];  Service: Endoscopy;;  . SPINAL CORD STIMULATOR INSERTION N/A 03/20/2016   Procedure: LUMBAR SPINAL CORD STIMULATOR INSERTION;  Surgeon: Clydell Hakim, MD;  Location: [facility];  Service: Neurosurgery;  Laterality: N/A;  . TONSILLECTOMY    . [place] ?    Social History   Socioeconomic History  . Marital status: Married    Spouse name: Not on file  . Number of children: 0  . Years of education: Masters +  . Highest education level: Not on file  Occupational History  . Occupation: Retired  Tobacco Use  . Smoking status: Never Smoker  . Smokeless tobacco: Never Used  Vaping Use  . Vaping Use: Never used  Substance and Sexual Activity  . Alcohol use: Yes    Alcohol/week: 0.0 standard drinks    Comment: seldom   . Drug use: No  . Sexual activity: Yes  Other Topics Concern  . Not on file  Social  History Narrative   Marital status: married x 1969      Children: none      Lives: with wife, 1 dog      Employment: retired at age 66.  Education music      Tobacco: none      Alcohol: rare      Exercise: stationary bike in 2018; rare use in 2018 due to sick dog.      ADLs: independent with ADLs; drives      Advanced Directives:    2 cups of coffee a day, soda or tea about 3 times a week.    Social Determinants of Health   Financial Resource Strain:   . Difficulty of Paying Living Expenses: Not on file  Food Insecurity:   . Worried About Charity fundraiser in the Last Year: Not on file  . Ran Out of Food in the Last Year: Not on file  Transportation Needs:   . Lack of Transportation  (Medical): Not on file  . Lack of Transportation (Non-Medical): Not on file  Physical Activity:   . Days of Exercise per Week: Not on file  . Minutes of Exercise per Session: Not on file  Stress:   . Feeling of Stress : Not on file  Social Connections:   . Frequency of Communication with Friends and Family: Not on file  . Frequency of Social Gatherings with Friends and Family: Not on file  . Attends Religious Services: Not on file  . Active Member of Clubs or Organizations: Not on file  . Attends Archivist Meetings: Not on file  . Marital Status: Not on file  Intimate Partner Violence:   . Fear of Current or Ex-Partner: Not on file  . Emotionally Abused: Not on file  . Physically Abused: Not on file  . Sexually Abused: Not on file    Current Outpatient Medications on File Prior to Visit  Medication Sig Dispense Refill  . acetaminophen (TYLENOL) 650 MG CR tablet Take 650 mg by mouth 5 (five) times daily.     Marland Kitchen aspirin 81 MG tablet Take 81 mg by mouth every evening.     . bimatoprost (LUMIGAN) 0.01 % SOLN Place 1 drop into both eyes every evening.    . brimonidine-timolol (COMBIGAN) 0.2-0.5 % ophthalmic solution Place 1 drop into both eyes 2 (two) times daily.    . brinzolamide (AZOPT) 1 % ophthalmic suspension Place 1 drop into both eyes 2 (two) times daily.     . chlorthalidone (HYGROTON) 25 MG tablet Take 0.5 tablets (12.5 mg total) by mouth daily. 45 tablet 1  . diclofenac sodium (VOLTAREN) 1 % GEL Apply 4 g topically 4 (four) times daily. 100 g 5  . empagliflozin (JARDIANCE) 25 MG TABS tablet Take 25 mg by mouth daily. 90 tablet 3  . FREESTYLE LITE test strip USE TO TEST BLOOD SUGAR ONCE DAILY 100 strip 3  . Lancets (FREESTYLE) lancets Use to test blood sugar once daily 100 each 12  . Magnesium 125 MG CAPS Take by mouth. Every 2-3 days    . metoprolol tartrate (LOPRESSOR) 25 MG tablet TAKE 1 TABLET BY MOUTH TWICE DAILY 180 tablet 3  . Milk Thistle 250 MG CAPS Take by  mouth.    . Multiple Vitamins-Minerals (PRESERVISION AREDS 2) CAPS Take 1 capsule by mouth 2 (two) times daily.    . Omega-3 Fatty Acids (FISH OIL) 1000 MG CAPS Take by mouth.    Marland Kitchen OVER THE COUNTER  MEDICATION Take 1,000 mg by mouth daily. Microlactin 1000 mg Supplement    . OVER THE COUNTER MEDICATION Take 2-3 drops by mouth See admin instructions. CBD Hemp Oil - Place 3 drops in mouth in the morning, place 2 drops in mouth at lunch and dinner, and place 3 drops in mouth at bedtime.    . Potassium (POTASSIMIN PO) Take by mouth.    . potassium chloride (KLOR-CON) 10 MEQ tablet TAKE 1 TABLET(10 MEQ) BY MOUTH TWICE DAILY 180 tablet 0  . rosuvastatin (CRESTOR) 20 MG tablet Take 1 tablet (20 mg total) by mouth daily. 90 tablet 3  . sitaGLIPtin (JANUVIA) 100 MG tablet Take 1 tablet (100 mg total) by mouth daily. 90 tablet 3  . SUMAtriptan (IMITREX) 50 MG tablet TAKE 1 TABLET BY MOUTH EVERY 2 HOURS AS NEEDED FOR MIGRAINE 9 tablet 3  . nitroGLYCERIN (NITROSTAT) 0.4 MG SL tablet Place 1 tablet (0.4 mg total) under the tongue every 5 (five) minutes as needed for up to 25 days for chest pain. (Patient not taking: Reported on 01/31/2020) 25 tablet 3   No current facility-administered medications on file prior to visit.    Allergies  Allergen Reactions  . Amaryl Other (See Comments)    Gives pt migraines  . Other Other (See Comments)    Pistachios, lima beans, and some types of chocolate cause migraines  Nasal corticosteroids causes nose bleeds  . Skelaxin [Metaxalone] Dermatitis and Other (See Comments)    Cherry red rash with sloughing off of skin at left upper chest/shoulder  . Stool Softener [Dss] Other (See Comments)    Causes migraines  . Norco [Hydrocodone-Acetaminophen] Other (See Comments)    Ineffective  . Pistachio Nut (Diagnostic) Other (See Comments)     In  Addition :Lima beans, milk chocolate   . Doc-Q-Lax [Digital Fever Thermometer] Other (See Comments)    Headache   .  Levofloxacin Nausea Only  . Lisinopril Cough  . Metformin And Related Diarrhea    Family History  Problem Relation Age of Onset  . Heart disease Father   . Heart attack Father   . Heart disease Paternal Grandmother   . Hypercalcemia Neg Hx     BP 112/70   Pulse 64   Ht 6' (1.829 m)   Wt 205 lb 9.6 oz (93.3 kg)   SpO2 96%   BMI 27.88 kg/m    Review of Systems Denies sob.      Objective:   Physical Exam VITAL SIGNS:  See vs page GENERAL: no distress EXT: trace bilat leg edema  Lab Results  Component Value Date   PTH 49 07/09/2020   CALCIUM 11.1 (H) 07/09/2020   CAION 1.25 12/05/2014        Assessment & Plan:  Hyperparathyroidism: better with Vit-D supplementation Hypercalcemia: uncontrolled. Pt is advised to d/c chlorthalidone. Please come back for a follow-up appointment in 6 weeks

## 2020-07-10 ENCOUNTER — Encounter: Payer: Self-pay | Admitting: Family Medicine

## 2020-07-10 ENCOUNTER — Encounter: Payer: Self-pay | Admitting: Endocrinology

## 2020-07-10 LAB — PTH, INTACT AND CALCIUM
Calcium: 11.1 mg/dL — ABNORMAL HIGH (ref 8.6–10.3)
PTH: 49 pg/mL (ref 14–64)

## 2020-07-11 NOTE — Telephone Encounter (Signed)
Pt reporting about medications and blood pressure fluctuating with headaches that do not subside with APAP but sometimes with immitrex. Anything to advise? appt needed? Last OV 04/09/2020 scheduled 10/08/2020

## 2020-07-12 MED ORDER — LOSARTAN POTASSIUM 50 MG PO TABS: 50.0000 mg | ORAL_TABLET | Freq: Every day | ORAL | 3 refills | Status: DC

## 2020-07-12 NOTE — Telephone Encounter (Signed)
Pt states he is uncomfortable increasing Amlodapine due to side effects list. Pt would rather you suggest a substitute. Please advise directions

## 2020-07-18 ENCOUNTER — Encounter: Payer: Self-pay | Admitting: Physician Assistant

## 2020-07-18 ENCOUNTER — Ambulatory Visit (INDEPENDENT_AMBULATORY_CARE_PROVIDER_SITE_OTHER): Payer: Medicare Other | Admitting: Physician Assistant

## 2020-07-18 ENCOUNTER — Other Ambulatory Visit: Payer: Self-pay

## 2020-07-18 DIAGNOSIS — D0422 Carcinoma in situ of skin of left ear and external auricular canal: Secondary | ICD-10-CM | POA: Diagnosis not present

## 2020-07-18 DIAGNOSIS — C4492 Squamous cell carcinoma of skin, unspecified: Secondary | ICD-10-CM

## 2020-07-18 DIAGNOSIS — C44329 Squamous cell carcinoma of skin of other parts of face: Secondary | ICD-10-CM | POA: Diagnosis not present

## 2020-07-18 DIAGNOSIS — R21 Rash and other nonspecific skin eruption: Secondary | ICD-10-CM

## 2020-07-18 DIAGNOSIS — Z1283 Encounter for screening for malignant neoplasm of skin: Secondary | ICD-10-CM | POA: Diagnosis not present

## 2020-07-18 DIAGNOSIS — D489 Neoplasm of uncertain behavior, unspecified: Secondary | ICD-10-CM

## 2020-07-18 HISTORY — DX: Squamous cell carcinoma of skin, unspecified: C44.92

## 2020-07-18 NOTE — Patient Instructions (Signed)

## 2020-07-18 NOTE — Progress Notes (Addendum)
° °  New Patient   Subjective  [name] is a 74 y.o. male who presents for the following: Establish Care, Rash (at the collar bone and throat), and Lesion (left top ear and left cheek).   The following portions of the chart were reviewed this encounter and updated as appropriate: Tobacco   Allergies   Meds   Problems   Med Hx   Surg Hx   Fam Hx       Objective  Well appearing patient in no apparent distress; mood and affect are within normal limits.  All skin waist up examined.  Objective  Neck - Anterior: Confluent vascular lesions base of the neck. Positive blanch.  No whelps or papules  Objective  waist up: No atypical nevi   Objective  Left Ear: Hyperkeratotic scale with pink base       Objective  Left Buccal Cheek : Pearly papule with telangectasia.       Assessment & Plan  Rash and other nonspecific skin eruption Neck - Anterior  Observe. Could be a drug eruption from amlodipine. He has been off the medication for 1 week.  Continue lidocaine and hydrocortisone.  Screening exam for skin cancer waist up  Yearly skin exams  Neoplasm of uncertain behavior (2) Left Ear  Epidermal / dermal shaving  Lesion diameter (cm):  1.2 Informed consent: discussed and consent obtained   Timeout: patient name, date of birth, surgical site, and procedure verified   Procedure prep:  Patient was prepped and draped in usual sterile fashion Prep type:  Chlorhexidine Anesthesia: the lesion was anesthetized in a standard fashion   Anesthetic:  1% lidocaine w/ epinephrine 1-100,000 local infiltration Instrument used: DermaBlade   Hemostasis achieved with: aluminum chloride   Outcome: patient tolerated procedure well   Post-procedure details: sterile dressing applied and wound care instructions given   Dressing type: petrolatum gauze, petrolatum and bandage    Specimen 1 - Surgical pathology Differential Diagnosis: scc Check Margins: yes  Left Buccal  Cheek   Epidermal / dermal shaving  Lesion diameter (cm):  1.3 Informed consent: discussed and consent obtained   Timeout: patient name, date of birth, surgical site, and procedure verified   Procedure prep:  Patient was prepped and draped in usual sterile fashion Prep type:  Chlorhexidine Anesthesia: the lesion was anesthetized in a standard fashion   Anesthetic:  1% lidocaine w/ epinephrine 1-100,000 local infiltration Instrument used: DermaBlade   Hemostasis achieved with: aluminum chloride   Outcome: patient tolerated procedure well   Post-procedure details: sterile dressing applied and wound care instructions given   Dressing type: petrolatum gauze, petrolatum and bandage    Specimen 2 - Surgical pathology Differential Diagnosis: scc Check Margins: yes   I, Elyan Vanwieren, PA-C, have reviewed all documentation for this visit. The documentation on 07/25/20 for the exam, diagnosis, procedures, and orders are all accurate and complete.

## 2020-07-22 ENCOUNTER — Encounter: Payer: Self-pay | Admitting: Family Medicine

## 2020-07-22 DIAGNOSIS — I10 Essential (primary) hypertension: Secondary | ICD-10-CM

## 2020-07-23 MED ORDER — LOSARTAN POTASSIUM 100 MG PO TABS
100.0000 mg | ORAL_TABLET | Freq: Every day | ORAL | 3 refills | Status: DC
Start: 2020-07-23 — End: 2021-01-09

## 2020-07-23 NOTE — Telephone Encounter (Signed)
Pt started losartan and notes had good BP 2-3 days then spiked 180/90 and has remained high. Pt notes when starting losartan also started back on Chlorthalidone 1/2 tablet unsure mg wants to know if he should change his dose or make an appointment to see you?

## 2020-07-23 NOTE — Telephone Encounter (Signed)
Pt messaged back with input and some more history on BP and meds

## 2020-07-24 ENCOUNTER — Encounter: Payer: Self-pay | Admitting: Physician Assistant

## 2020-07-29 ENCOUNTER — Encounter: Payer: Self-pay | Admitting: *Deleted

## 2020-07-29 ENCOUNTER — Telehealth: Payer: Self-pay | Admitting: *Deleted

## 2020-07-29 NOTE — Telephone Encounter (Signed)
-----   Message from Warren Danes, [place] sent at 07/25/2020 12:39 PM EDT ----- 30 min sx- ok to add at end of the day somewhere

## 2020-07-29 NOTE — Telephone Encounter (Signed)
Left message for patient to return our phone call.

## 2020-07-30 NOTE — Telephone Encounter (Signed)
Pathology results to patient, 30 minute surgery appointment scheduled

## 2020-08-05 DIAGNOSIS — Z23 Encounter for immunization: Secondary | ICD-10-CM | POA: Diagnosis not present

## 2020-08-08 ENCOUNTER — Ambulatory Visit (INDEPENDENT_AMBULATORY_CARE_PROVIDER_SITE_OTHER): Payer: Medicare Other | Admitting: Physician Assistant

## 2020-08-08 ENCOUNTER — Other Ambulatory Visit: Payer: Self-pay

## 2020-08-08 DIAGNOSIS — D049 Carcinoma in situ of skin, unspecified: Secondary | ICD-10-CM

## 2020-08-08 DIAGNOSIS — C4492 Squamous cell carcinoma of skin, unspecified: Secondary | ICD-10-CM

## 2020-08-08 DIAGNOSIS — C44329 Squamous cell carcinoma of skin of other parts of face: Secondary | ICD-10-CM | POA: Diagnosis not present

## 2020-08-08 DIAGNOSIS — D0422 Carcinoma in situ of skin of left ear and external auricular canal: Secondary | ICD-10-CM | POA: Diagnosis not present

## 2020-08-08 DIAGNOSIS — I25118 Atherosclerotic heart disease of native coronary artery with other forms of angina pectoris: Secondary | ICD-10-CM

## 2020-08-08 NOTE — Patient Instructions (Signed)

## 2020-08-09 ENCOUNTER — Encounter: Payer: Self-pay | Admitting: Family Medicine

## 2020-08-11 ENCOUNTER — Encounter: Payer: Self-pay | Admitting: Family Medicine

## 2020-08-12 ENCOUNTER — Other Ambulatory Visit: Payer: Self-pay

## 2020-08-12 ENCOUNTER — Encounter: Payer: Self-pay | Admitting: Physician Assistant

## 2020-08-12 DIAGNOSIS — R519 Headache, unspecified: Secondary | ICD-10-CM

## 2020-08-12 MED ORDER — SUMATRIPTAN SUCCINATE 50 MG PO TABS: ORAL_TABLET | ORAL | 3 refills | Status: DC

## 2020-08-12 NOTE — Progress Notes (Signed)
   Follow-Up Visit   Subjective  [name] is a 74 y.o. male who presents for the following: Procedure (Left ear and left cheek SCC x 2).   The following portions of the chart were reviewed this encounter and updated as appropriate: Tobacco  Allergies  Meds  Problems  Med Hx  Surg Hx  Fam Hx      Objective  Well appearing patient in no apparent distress; mood and affect are within normal limits.  All skin waist up examined.  Objective  Left Ear: Scaly pink papule or plaque.  Previous [phone]  Objective  Left Buccal Cheek : Scaly pink papule or plaque.  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  Destruction of lesion Complexity: simple   Destruction method: electrodesiccation and curettage   Informed consent: discussed and consent obtained   Timeout:  patient name, date of birth, surgical site, and procedure verified Anesthesia: the lesion was anesthetized in a standard fashion   Anesthetic:  1% lidocaine w/ epinephrine 1-100,000 local infiltration Curettage performed in three different directions: Yes   Electrodesiccation performed over the curetted area: Yes   Final wound size (cm):  1.1 Hemostasis achieved with:  ferric subsulfate Outcome: patient tolerated procedure well with no complications   Additional details:  Wound innoculated with 5 fluorouracil solution.  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   Destruction of lesion Complexity: simple   Destruction method: electrodesiccation and curettage   Informed consent: discussed and consent obtained   Timeout:  patient name, date of birth, surgical site, and procedure verified Anesthesia: the lesion was anesthetized in a standard fashion   Anesthetic:  1% lidocaine w/ epinephrine 1-100,000 local infiltration Curettage performed in three different directions: Yes   Curettage cycles:  3 Margin per side (cm):   0.1 Final wound size (cm):  1.5 Hemostasis achieved with:  aluminum chloride and ferric subsulfate Outcome: patient tolerated procedure well with no complications   Post-procedure details: wound care instructions given   Additional details:  Wound innoculated with 5 fluorouracil solution.    I, Kierstynn Babich, PA-C, have reviewed all documentation's for this visit.  The documentation on 08/12/20 for the exam, diagnosis, procedures and orders are all accurate and complete.

## 2020-08-13 ENCOUNTER — Other Ambulatory Visit: Payer: Self-pay

## 2020-08-13 ENCOUNTER — Ambulatory Visit (INDEPENDENT_AMBULATORY_CARE_PROVIDER_SITE_OTHER): Payer: Medicare Other | Admitting: Family Medicine

## 2020-08-13 ENCOUNTER — Encounter: Payer: Self-pay | Admitting: Family Medicine

## 2020-08-13 VITALS — BP 130/76 | HR 62 | Temp 97.3°F | Ht 72.0 in | Wt 203.6 lb

## 2020-08-13 DIAGNOSIS — I10 Essential (primary) hypertension: Secondary | ICD-10-CM | POA: Diagnosis not present

## 2020-08-13 DIAGNOSIS — E785 Hyperlipidemia, unspecified: Secondary | ICD-10-CM | POA: Diagnosis not present

## 2020-08-13 MED ORDER — SPIRONOLACTONE 25 MG PO TABS: 25.0000 mg | ORAL_TABLET | Freq: Every day | ORAL | 3 refills | Status: AC

## 2020-08-13 NOTE — Progress Notes (Signed)
9/28/20218:23 AM  [name] 02/20/1946, 74 y.o., male [id number]  Chief Complaint  Patient presents with  . Diabetes  . Hypertension    has concerns about bp    HPI:   Patient is a 74 y.o. male with past medical history significant for DM2, HLP, HTN, OSA, DDD of lumbar spine s/p fusion,hypercalcemia who presents today forroutine followup  Last OV may 2021 - increased crestor to 20mg  daily, had to change BP meds to hypercalcemia, increased vitamin D per endo request Recent derm skin bx, SCC - has been treated Patient is overall doing well He checks bp at home and morning readings are ok but having elevated evening readings with increased facial flushing, low level headaches (not migraine) - above goal (~ 180s) Amlodipine in the past caused swelling He continues to have joint pain, mental confusion He brought in BP cuff and reads accurately Takes losartan AM, chlorthalidone in afternoon, metoprolol breakfast and dinner His blood glucose in the 120s  Lab Results  Component Value Date   HGBA1C 6.7 (H) 04/08/2020   HGBA1C 6.5 (H) 10/10/2019   HGBA1C 6.4 (H) 03/13/2019   Lab Results  Component Value Date   MICROALBUR 0.5 06/03/2016   LDLCALC 88 04/08/2020   CREATININE 0.89 04/08/2020   Lab Results  Component Value Date   CREATININE 0.89 04/08/2020   BUN 20 04/08/2020   NA 143 04/08/2020   K 3.5 04/08/2020   CL 100 04/08/2020   CO2 24 04/08/2020   Lab Results  Component Value Date   CALCIUM 11.1 (H) 07/09/2020    Depression screen PHQ 2/9 04/09/2020 01/31/2020 10/10/2019  Decreased Interest 0 0 0  Down, Depressed, Hopeless 0 0 0  PHQ - 2 Score 0 0 0  Altered sleeping - - -  Tired, decreased energy - - -  Change in appetite - - -  Feeling bad or failure about yourself  - - -  Trouble concentrating - - -  Moving slowly or fidgety/restless - - -  Suicidal thoughts - - -  PHQ-9 Score - - -  Difficult doing work/chores - - -    Fall Risk  08/13/2020  04/09/2020 01/31/2020 10/10/2019 07/10/2019  Falls in the past year? 1 0 0 1 0  Number falls in past yr: 0 0 0 1 0  Injury with Fall? 1 0 0 1 0  Risk for fall due to : - - - - -  Follow up - Falls evaluation completed Falls evaluation completed;Education provided - -     Allergies  Allergen Reactions  . Amaryl Other (See Comments)    Gives pt migraines  . Other Other (See Comments)    Pistachios, lima beans, and some types of chocolate cause migraines  Nasal corticosteroids causes nose bleeds  . Skelaxin [Metaxalone] Dermatitis and Other (See Comments)    Cherry red rash with sloughing off of skin at left upper chest/shoulder  . Stool Softener [Dss] Other (See Comments)    Causes migraines  . Norco [Hydrocodone-Acetaminophen] Other (See Comments)    Ineffective  . Pistachio Nut (Diagnostic) Other (See Comments)     In  Addition :Lima beans, milk chocolate   . Doc-Q-Lax [Digital Fever Thermometer] Other (See Comments)    Headache   . Levofloxacin Nausea Only  . Lisinopril Cough  . Metformin And Related Diarrhea    Prior to Admission medications   Medication Sig Start Date End Date Taking? Authorizing Provider  acetaminophen (TYLENOL) 650 MG CR tablet  Take 650 mg by mouth 5 (five) times daily.    Yes [provider]  aspirin 81 MG tablet Take 81 mg by mouth every evening.    Yes [provider]  bimatoprost (LUMIGAN) 0.01 % SOLN Place 1 drop into both eyes every evening.   Yes [provider]  brimonidine-timolol (COMBIGAN) 0.2-0.5 % ophthalmic solution Place 1 drop into both eyes 2 (two) times daily.   Yes [provider]  brinzolamide (AZOPT) 1 % ophthalmic suspension Place 1 drop into both eyes 2 (two) times daily.    Yes [provider]  chlorthalidone (HYGROTON) 25 MG tablet Take 0.5 tablets (12.5 mg total) by mouth daily. 05/27/20  Yes Renato Shin, MD  empagliflozin (JARDIANCE) 25 MG TABS tablet Take 25 mg by mouth daily. 07/31/19   Yes Rutherford Guys, MD  FREESTYLE LITE test strip USE TO TEST BLOOD SUGAR ONCE DAILY 07/21/19  Yes Delia Chimes A, MD  Lancets (FREESTYLE) lancets Use to test blood sugar once daily 07/26/19  Yes Rutherford Guys, MD  losartan (COZAAR) 100 MG tablet Take 1 tablet (100 mg total) by mouth daily. 07/23/20  Yes Rutherford Guys, MD  Magnesium 125 MG CAPS Take by mouth. Every 2-3 days   Yes [provider]  metoprolol tartrate (LOPRESSOR) 25 MG tablet TAKE 1 TABLET BY MOUTH TWICE DAILY 04/11/20  Yes Adrian Prows, MD  Milk Thistle 250 MG CAPS Take by mouth.   Yes [provider]  Multiple Vitamins-Minerals (PRESERVISION AREDS 2) CAPS Take 1 capsule by mouth 2 (two) times daily.   Yes [provider]  Omega-3 Fatty Acids (FISH OIL) 1000 MG CAPS Take by mouth.   Yes [provider]  OVER THE COUNTER MEDICATION Take 1,000 mg by mouth daily. Microlactin 1000 mg Supplement   Yes [provider]  OVER THE COUNTER MEDICATION Take 2-3 drops by mouth See admin instructions. CBD Hemp Oil - Place 3 drops in mouth in the morning, place 2 drops in mouth at lunch and dinner, and place 3 drops in mouth at bedtime.   Yes [provider]  Potassium (POTASSIMIN PO) Take by mouth.   Yes [provider]  potassium chloride (KLOR-CON) 10 MEQ tablet TAKE 1 TABLET(10 MEQ) BY MOUTH TWICE DAILY 07/05/20  Yes Rutherford Guys, MD  rosuvastatin (CRESTOR) 20 MG tablet Take 1 tablet (20 mg total) by mouth daily. 04/09/20  Yes Rutherford Guys, MD  sitaGLIPtin (JANUVIA) 100 MG tablet Take 1 tablet (100 mg total) by mouth daily. 07/31/19  Yes Rutherford Guys, MD  SUMAtriptan (IMITREX) 50 MG tablet TAKE 1 TABLET BY MOUTH EVERY 2 HOURS AS NEEDED FOR MIGRAINE 08/12/20  Yes Rutherford Guys, MD  nitroGLYCERIN (NITROSTAT) 0.4 MG SL tablet Place 1 tablet (0.4 mg total) under the tongue every 5 (five) minutes as needed for up to 25 days for chest pain. Patient not taking: Reported on  01/31/2020 01/09/19 01/10/20  Adrian Prows, MD    Past Medical History:  Diagnosis Date  . Arthritis    DDD, scoliosis, stenosis    . 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 Calcium blood increased   . Cancer (HCC)    skin ca, posterior R ear   (BENIGN)  . Complication of anesthesia    difficulty intubation , anterior larynx, easy mask ventilation  . Coronary artery disease   . Diabetes mellitus without complication ([place])   . Difficult intubation   .  Diverticulitis   . Glaucoma   . Headache(784.0)    migraines  . History of kidney stones   . Hyperlipemia   . Hypertension   . 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 Sleep apnea    CPAP still in use q night, & for naps thru out the day, pt. reports that last study was neg for sleep apnea but he still uses the device    . Spinal stenosis   . 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 Ventricular dilatation    Right    Past Surgical History:  Procedure Laterality Date  . APPENDECTOMY  1991  . BOWEL RESECTION  1985  . CARDIAC CATHETERIZATION  2006  . CATARACT EXTRACTION Right   . CHOLECYSTECTOMY  1990  . COLON SURGERY     bowel resection, for diverticulitis   . COLONOSCOPY N/A 06/15/2013   Procedure: COLONOSCOPY;  Surgeon: Beryle Beams, MD;  Location: [facility];  Service: Endoscopy;  Laterality: N/A;  . COLONOSCOPY WITH PROPOFOL N/A 07/01/2018   Procedure: COLONOSCOPY WITH PROPOFOL;  Surgeon: Carol Ada, MD;  Location: [facility];  Service: Endoscopy;  Laterality: N/A;  . CORONARY ANGIOPLASTY     STENT PLACED  . LAMINECTOMY WITH POSTERIOR LATERAL ARTHRODESIS LEVEL 1 N/A 04/09/2017   Procedure: Lumbar TwoThree,Lumbar Three-Four,Lumbar Four-Five Laminectomy with Lumbar Five-Sacral One Posterior lateral fusion;  Surgeon: Eustace Moore, MD;  Location: [facility];  Service:  Neurosurgery;  Laterality: N/A;  . LITHOTRIPSY    . POLYPECTOMY  07/01/2018   Procedure: POLYPECTOMY;  Surgeon: Carol Ada, MD;  Location: [facility];  Service: Endoscopy;;  . SPINAL CORD STIMULATOR INSERTION N/A 03/20/2016   Procedure: LUMBAR SPINAL CORD STIMULATOR INSERTION;  Surgeon: Clydell Hakim, MD;  Location: [facility];  Service: Neurosurgery;  Laterality: N/A;  . TONSILLECTOMY    . [place] ?    Social History   Tobacco Use  . Smoking status: Never Smoker  . Smokeless tobacco: Never Used  Substance Use Topics  . Alcohol use: Yes    Alcohol/week: 0.0 standard drinks    Comment: seldom     Family History  Problem Relation Age of Onset  . Heart disease Father   . Heart attack Father   . Heart disease Paternal Grandmother   . Hypercalcemia Neg Hx     Review of Systems  Constitutional: Negative for chills and fever.  Respiratory: Negative for cough and shortness of breath.   Cardiovascular: Negative for chest pain, palpitations and leg swelling.  Gastrointestinal: Negative for abdominal pain, nausea and vomiting.   Per hpi  OBJECTIVE:  Today's Vitals   08/13/20 0816  BP: 130/76  Pulse: 62  Temp: (!) 97.3 F (36.3 C)  SpO2: 96%  Weight: 203 lb 9.6 oz (92.4 kg)  Height: 6' (1.829 m)   Body mass index is 27.61 kg/m.   Physical Exam Vitals and nursing note reviewed.  Constitutional:      Appearance: He is well-developed.  HENT:     Head: Normocephalic and atraumatic.  Eyes:     Extraocular Movements: Extraocular movements intact.     Conjunctiva/sclera: Conjunctivae normal.     Pupils: Pupils are equal, round, and reactive to light.  Cardiovascular:     Rate and Rhythm: Normal rate and regular rhythm.     Heart sounds: No murmur  heard.  No friction rub. No gallop.   Pulmonary:     Effort: Pulmonary effort is normal.     Breath sounds: Normal breath sounds. No wheezing, rhonchi or rales.  Musculoskeletal:     Cervical  back: Neck supple.     Right lower leg: No edema.     Left lower leg: No edema.  Skin:    General: Skin is warm and dry.  Neurological:     Mental Status: He is alert and oriented to person, place, and time.     No results found for this or any previous visit (from the past 24 hour(s)).  No results found.   ASSESSMENT and PLAN  1. Essential hypertension Currently at goal but with cont evening elevations, d/c chlorthalidone given hypercalcemia. Start spironolactone (stop KCl supplements), reviewed r/se/b. Cont home BP monitoring, repeat labs in 1 week, report BP readings via mychart   2. Hypercalcemia Managed by endo. Discussed with patient that some symptoms might be related to hypercalcemia (joint pain, fatigue, memory issues) - discuss further with endo - Comprehensive metabolic panel; Future  3. Hyperlipidemia LDL goal <70 Checking labs, medications will be adjusted as needed.  - Lipid panel; Future  Other orders - spironolactone (ALDACTONE) 25 MG tablet; Take 1 tablet (25 mg total) by mouth daily.  Return in about 2 months (around 10/13/2020) for [facility] - Dr Mitchel Honour.    Rutherford Guys, MD Primary Care at [facility] [place], [place] [address] Ph.  [phone] Fax [phone]

## 2020-08-13 NOTE — Patient Instructions (Signed)
° ° ° °  If you have lab work done today you will be contacted with your lab results within the next 2 weeks.  If you have not heard from us then please contact us. The fastest way to get your results is to register for My Chart. ° ° °IF you received an x-ray today, you will receive an invoice from [facility]. Please contact  Radiology at [phone] with questions or concerns regarding your invoice.  ° °IF you received labwork today, you will receive an invoice from [facility]. Please contact [facility] at [phone] with questions or concerns regarding your invoice.  ° °Our billing staff will not be able to assist you with questions regarding bills from these companies. ° °You will be contacted with the lab results as soon as they are available. The fastest way to get your results is to activate your My Chart account. Instructions are located on the last page of this paperwork. If you have not heard from us regarding the results in 2 weeks, please contact this office. °  ° ° ° °

## 2020-08-14 ENCOUNTER — Encounter: Payer: Self-pay | Admitting: Endocrinology

## 2020-08-16 ENCOUNTER — Other Ambulatory Visit: Payer: Self-pay | Admitting: Family Medicine

## 2020-08-19 ENCOUNTER — Other Ambulatory Visit: Payer: Self-pay

## 2020-08-19 ENCOUNTER — Ambulatory Visit: Payer: Medicare Other

## 2020-08-19 DIAGNOSIS — E119 Type 2 diabetes mellitus without complications: Secondary | ICD-10-CM | POA: Diagnosis not present

## 2020-08-19 DIAGNOSIS — I10 Essential (primary) hypertension: Secondary | ICD-10-CM | POA: Diagnosis not present

## 2020-08-19 DIAGNOSIS — E785 Hyperlipidemia, unspecified: Secondary | ICD-10-CM

## 2020-08-19 LAB — HEMOGLOBIN A1C
Est. average glucose Bld gHb Est-mCnc: 128 mg/dL
Hgb A1c MFr Bld: 6.1 % — ABNORMAL HIGH (ref 4.8–5.6)

## 2020-08-19 LAB — BASIC METABOLIC PANEL
BUN/Creatinine Ratio: 21 (ref 10–24)
BUN: 18 mg/dL (ref 8–27)
CO2: 24 mmol/L (ref 20–29)
Calcium: 10.7 mg/dL — ABNORMAL HIGH (ref 8.6–10.2)
Chloride: 105 mmol/L (ref 96–106)
Creatinine, Ser: 0.85 mg/dL (ref 0.76–1.27)
GFR calc Af Amer: 99 mL/min/{1.73_m2} (ref >59)
GFR calc non Af Amer: 86 mL/min/{1.73_m2} (ref >59)
Glucose: 120 mg/dL — ABNORMAL HIGH (ref 65–99)
Potassium: 4.2 mmol/L (ref 3.5–5.2)
Sodium: 143 mmol/L (ref 134–144)

## 2020-08-20 ENCOUNTER — Encounter: Payer: Self-pay | Admitting: Family Medicine

## 2020-08-20 NOTE — Telephone Encounter (Signed)
Check in from pt possibly commenting on yesterdays labs?

## 2020-08-27 ENCOUNTER — Other Ambulatory Visit: Payer: Self-pay

## 2020-08-27 ENCOUNTER — Ambulatory Visit (INDEPENDENT_AMBULATORY_CARE_PROVIDER_SITE_OTHER): Payer: Medicare Other | Admitting: Endocrinology

## 2020-08-27 DIAGNOSIS — I25118 Atherosclerotic heart disease of native coronary artery with other forms of angina pectoris: Secondary | ICD-10-CM

## 2020-08-27 LAB — PHOSPHORUS: Phosphorus: 3.3 mg/dL (ref 2.3–4.6)

## 2020-08-27 LAB — MAGNESIUM: Magnesium: 2.3 mg/dL (ref 1.5–2.5)

## 2020-08-27 LAB — VITAMIN D 25 HYDROXY (VIT D DEFICIENCY, FRACTURES): VITD: 35.7 ng/mL (ref 30.00–100.00)

## 2020-08-27 NOTE — Patient Instructions (Signed)
Please continue the same medications. Blood tests are requested for you today.  We'll let you know about the results.  If these tests do not tell us a reason for the high blood calcium, the next step is to check a 24-HR urine collection for calcium.   Please come back for a follow-up appointment in 3 months.

## 2020-08-27 NOTE — Progress Notes (Signed)
Subjective:    Patient ID: [name], male    DOB: 1946-07-11, 74 y.o.   MRN: [id number]  HPI Pt returns for f/u of hypercalcemia and hyperparathyroidism (dx'ed 2011; he has never had osteoporosis; only bony fracture was right hand; he has h/o urolithiasis; PTH normalized with Vit-D supplement). He takes vit-D, at an uncertain dosage.  Aldactone was started after chlorthalidone was stopped.  Past Medical History:  Diagnosis Date  . Arthritis    DDD, scoliosis, stenosis    . 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 Calcium blood increased   . Cancer (HCC)    skin ca, posterior R ear   (BENIGN)  . Complication of anesthesia    difficulty intubation , anterior larynx, easy mask ventilation  . Coronary artery disease   . Diabetes mellitus without complication ([place])   . Difficult intubation   . Diverticulitis   . Glaucoma   . Headache(784.0)    migraines  . History of kidney stones   . Hyperlipemia   . Hypertension   . 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 Sleep apnea    CPAP still in use q night, & for naps thru out the day, pt. reports that last study was neg for sleep apnea but he still uses the device    . Spinal stenosis   . 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 Ventricular dilatation    Right    Past Surgical History:  Procedure Laterality Date  . APPENDECTOMY  1991  . BOWEL RESECTION  1985  . CARDIAC CATHETERIZATION  2006  . CATARACT EXTRACTION Right   . CHOLECYSTECTOMY  1990  . COLON SURGERY     bowel resection, for diverticulitis   . COLONOSCOPY N/A 06/15/2013   Procedure: COLONOSCOPY;  Surgeon: Beryle Beams, MD;  [facility]: [facility];  Service: Endoscopy;  Laterality: N/A;  . COLONOSCOPY WITH PROPOFOL N/A 07/01/2018   Procedure: COLONOSCOPY WITH PROPOFOL;  Surgeon: Carol Ada, MD;  Location:  [facility];  Service: Endoscopy;  Laterality: N/A;  . CORONARY ANGIOPLASTY     STENT PLACED  . LAMINECTOMY WITH POSTERIOR LATERAL ARTHRODESIS LEVEL 1 N/A 04/09/2017   Procedure: Lumbar TwoThree,Lumbar Three-Four,Lumbar Four-Five Laminectomy with Lumbar Five-Sacral One Posterior lateral fusion;  Surgeon: Eustace Moore, MD;  Location: [facility];  Service: Neurosurgery;  Laterality: N/A;  . LITHOTRIPSY    . POLYPECTOMY  07/01/2018   Procedure: POLYPECTOMY;  Surgeon: Carol Ada, MD;  Location: [facility];  Service: Endoscopy;;  . SPINAL CORD STIMULATOR INSERTION N/A 03/20/2016   Procedure: LUMBAR SPINAL CORD STIMULATOR INSERTION;  Surgeon: Clydell Hakim, MD;  Location: [facility];  Service: Neurosurgery;  Laterality: N/A;  . TONSILLECTOMY    . [place] ?    Social History   Socioeconomic History  . Marital status: Married    Spouse name: Not on file  . Number of children: 0  . Years of education: Masters +  . Highest education level: Not on file  Occupational History  . Occupation: Retired  Tobacco Use  . Smoking status: Never Smoker  . Smokeless tobacco: Never Used  Vaping Use  . Vaping Use: Never used  Substance and Sexual Activity  . Alcohol use: Yes    Alcohol/week: 0.0 standard drinks    Comment: seldom   .  Drug use: No  . Sexual activity: Yes  Other Topics Concern  . Not on file  Social History Narrative   Marital status: married x 1969      Children: none      Lives: with wife, 1 dog      Employment: retired at age 79.  Education music      Tobacco: none      Alcohol: rare      Exercise: stationary bike in 2018; rare use in 2018 due to sick dog.      ADLs: independent with ADLs; drives      Advanced Directives:    2 cups of coffee a day, soda or tea about 3 times a week.    Social Determinants of Health   Financial Resource Strain:   . Difficulty of Paying Living Expenses: Not on file  Food Insecurity:   . Worried About Sales executive in the Last Year: Not on file  . Ran Out of Food in the Last Year: Not on file  Transportation Needs:   . Lack of Transportation (Medical): Not on file  . Lack of Transportation (Non-Medical): Not on file  Physical Activity:   . Days of Exercise per Week: Not on file  . Minutes of Exercise per Session: Not on file  Stress:   . Feeling of Stress : Not on file  Social Connections:   . Frequency of Communication with Friends and Family: Not on file  . Frequency of Social Gatherings with Friends and Family: Not on file  . Attends Religious Services: Not on file  . Active Member of Clubs or Organizations: Not on file  . Attends Archivist Meetings: Not on file  . Marital Status: Not on file  Intimate Partner Violence:   . Fear of Current or Ex-Partner: Not on file  . Emotionally Abused: Not on file  . Physically Abused: Not on file  . Sexually Abused: Not on file    Current Outpatient Medications on File Prior to Visit  Medication Sig Dispense Refill  . acetaminophen (TYLENOL) 650 MG CR tablet Take 650 mg by mouth 5 (five) times daily.     Marland Kitchen aspirin 81 MG tablet Take 81 mg by mouth every evening.     . bimatoprost (LUMIGAN) 0.01 % SOLN Place 1 drop into both eyes every evening.    . brimonidine-timolol (COMBIGAN) 0.2-0.5 % ophthalmic solution Place 1 drop into both eyes 2 (two) times daily.    . brinzolamide (AZOPT) 1 % ophthalmic suspension Place 1 drop into both eyes 2 (two) times daily.     . empagliflozin (JARDIANCE) 25 MG TABS tablet Take 25 mg by mouth daily. 90 tablet 3  . FREESTYLE LITE test strip USE TO TEST BLOOD SUGAR ONCE DAILY 100 strip 3  . Lancets (FREESTYLE) lancets Use to test blood sugar once daily 100 each 12  . losartan (COZAAR) 100 MG tablet Take 1 tablet (100 mg total) by mouth daily. 30 tablet 3  . Magnesium 125 MG CAPS Take by mouth. Every 2-3 days    . metoprolol tartrate (LOPRESSOR) 25 MG tablet TAKE 1 TABLET BY MOUTH TWICE DAILY 180 tablet 3   . Milk Thistle 250 MG CAPS Take by mouth.    . Multiple Vitamins-Minerals (PRESERVISION AREDS 2) CAPS Take 1 capsule by mouth 2 (two) times daily.    . nitroGLYCERIN (NITROSTAT) 0.4 MG SL tablet Place 1 tablet (0.4 mg total) under the tongue every 5 (five)  minutes as needed for up to 25 days for chest pain. (Patient not taking: Reported on 01/31/2020) 25 tablet 3  . Omega-3 Fatty Acids (FISH OIL) 1000 MG CAPS Take by mouth.    Marland Kitchen OVER THE COUNTER MEDICATION Take 1,000 mg by mouth daily. Microlactin 1000 mg Supplement    . OVER THE COUNTER MEDICATION Take 2-3 drops by mouth See admin instructions. CBD Hemp Oil - Place 3 drops in mouth in the morning, place 2 drops in mouth at lunch and dinner, and place 3 drops in mouth at bedtime.    . rosuvastatin (CRESTOR) 20 MG tablet Take 1 tablet (20 mg total) by mouth daily. 90 tablet 3  . sitaGLIPtin (JANUVIA) 100 MG tablet Take 1 tablet (100 mg total) by mouth daily. 90 tablet 3  . spironolactone (ALDACTONE) 25 MG tablet Take 1 tablet (25 mg total) by mouth daily. 30 tablet 3  . SUMAtriptan (IMITREX) 50 MG tablet TAKE 1 TABLET BY MOUTH EVERY 2 HOURS AS NEEDED FOR MIGRAINE 9 tablet 3   No current facility-administered medications on file prior to visit.    Allergies  Allergen Reactions  . Amaryl Other (See Comments)    Gives pt migraines  . Bisacodyl     Causes headaches  . Other Other (See Comments)    Pistachios, lima beans, and some types of chocolate cause migraines  Nasal corticosteroids causes nose bleeds  . Skelaxin [Metaxalone] Dermatitis and Other (See Comments)    Cherry red rash with sloughing off of skin at left upper chest/shoulder  . Stool Softener [Dss] Other (See Comments)    Causes migraines  . Norco [Hydrocodone-Acetaminophen] Other (See Comments)    Ineffective  . Pistachio Nut (Diagnostic) Other (See Comments)     In  Addition :Lima beans, milk chocolate   . Levofloxacin Nausea Only  . Lisinopril Cough  . Metformin And  Related Diarrhea    Family History  Problem Relation Age of Onset  . Heart disease Father   . Heart attack Father   . Heart disease Paternal Grandmother   . Hypercalcemia Neg Hx     BP 120/82 (BP Location: Right Leg)   Pulse 60   Ht 6' (1.829 m)   Wt 200 lb 12.8 oz (91.1 kg)   SpO2 97%   BMI 27.23 kg/m    Review of Systems     Objective:   Physical Exam VITAL SIGNS:  See vs page GENERAL: no distress EXT: trace bilat leg edema.    outside test results are reviewed: Ca++=10.7%     Assessment & Plan:  HTN: well-controlled.  Please continue the same aldactone and cozaar Hypercalcemia: uncertain etiology and prognosis.  Check labs today

## 2020-09-04 ENCOUNTER — Other Ambulatory Visit: Payer: Self-pay | Admitting: Endocrinology

## 2020-09-04 LAB — PTH-RELATED PEPTIDE

## 2020-09-04 LAB — VITAMIN D 1,25 DIHYDROXY
Vitamin D 1, 25 (OH)2 Total: 61 pg/mL (ref 18–72)
Vitamin D2 1, 25 (OH)2: <8 pg/mL
Vitamin D3 1, 25 (OH)2: 61 pg/mL

## 2020-09-04 LAB — PTH, INTACT AND CALCIUM
Calcium: 11.2 mg/dL — ABNORMAL HIGH (ref 8.6–10.3)
PTH: 77 pg/mL — ABNORMAL HIGH (ref 14–64)

## 2020-09-04 LAB — PROTEIN ELECTROPHORESIS, SERUM
Albumin ELP: 4.4 g/dL (ref 3.8–4.8)
Alpha 1: 0.3 g/dL (ref 0.2–0.3)
Alpha 2: 0.9 g/dL (ref 0.5–0.9)
Beta 2: 0.3 g/dL (ref 0.2–0.5)
Beta Globulin: 0.4 g/dL (ref 0.4–0.6)
Gamma Globulin: 0.7 g/dL — ABNORMAL LOW (ref 0.8–1.7)
Total Protein: 7 g/dL (ref 6.1–8.1)

## 2020-09-04 LAB — VITAMIN A: Vitamin A (Retinoic Acid): 77 ug/dL (ref 38–98)

## 2020-09-05 ENCOUNTER — Telehealth: Payer: Self-pay

## 2020-09-05 NOTE — Telephone Encounter (Signed)
Left message for patient to call office and schedule lab appointment, due to insufficient specimen.

## 2020-09-06 ENCOUNTER — Other Ambulatory Visit: Payer: Self-pay | Admitting: Endocrinology

## 2020-09-10 ENCOUNTER — Ambulatory Visit (INDEPENDENT_AMBULATORY_CARE_PROVIDER_SITE_OTHER): Payer: Medicare Other | Admitting: Emergency Medicine

## 2020-09-10 ENCOUNTER — Other Ambulatory Visit: Payer: Self-pay

## 2020-09-10 ENCOUNTER — Encounter: Payer: Self-pay | Admitting: Emergency Medicine

## 2020-09-10 ENCOUNTER — Other Ambulatory Visit: Payer: Medicare Other

## 2020-09-10 VITALS — BP 126/69 | HR 61 | Temp 96.8°F | Resp 16 | Ht 72.0 in | Wt 201.0 lb

## 2020-09-10 DIAGNOSIS — E785 Hyperlipidemia, unspecified: Secondary | ICD-10-CM

## 2020-09-10 DIAGNOSIS — E119 Type 2 diabetes mellitus without complications: Secondary | ICD-10-CM

## 2020-09-10 DIAGNOSIS — I10 Essential (primary) hypertension: Secondary | ICD-10-CM

## 2020-09-10 DIAGNOSIS — Z7689 Persons encountering health services in other specified circumstances: Secondary | ICD-10-CM

## 2020-09-10 DIAGNOSIS — I25118 Atherosclerotic heart disease of native coronary artery with other forms of angina pectoris: Secondary | ICD-10-CM

## 2020-09-10 NOTE — Patient Instructions (Addendum)
   If you have lab work done today you will be contacted with your lab results within the next 2 weeks.  If you have not heard from us then please contact us. The fastest way to get your results is to register for My Chart.   IF you received an x-ray today, you will receive an invoice from [facility]. Please contact [facility] at [phone] with questions or concerns regarding your invoice.   IF you received labwork today, you will receive an invoice from [facility]. Please contact [facility] at [phone] with questions or concerns regarding your invoice.   Our billing staff will not be able to assist you with questions regarding bills from these companies.  You will be contacted with the lab results as soon as they are available. The fastest way to get your results is to activate your My Chart account. Instructions are located on the last page of this paperwork. If you have not heard from us regarding the results in 2 weeks, please contact this office.     Health Maintenance After Age 65 After age 65, you are at a higher risk for certain long-term diseases and infections as well as injuries from falls. Falls are a major cause of broken bones and head injuries in people who are older than age 65. Getting regular preventive care can help to keep you healthy and well. Preventive care includes getting regular testing and making lifestyle changes as recommended by your health care provider. Talk with your health care provider about:  Which screenings and tests you should have. A screening is a test that checks for a disease when you have no symptoms.  A diet and exercise plan that is right for you. What should I know about screenings and tests to prevent falls? Screening and testing are the best ways to find a health problem early. Early diagnosis and treatment give you the best chance of managing medical conditions that are common after age 65. Certain conditions and  lifestyle choices may make you more likely to have a fall. Your health care provider may recommend:  Regular vision checks. Poor vision and conditions such as cataracts can make you more likely to have a fall. If you wear glasses, make sure to get your prescription updated if your vision changes.  Medicine review. Work with your health care provider to regularly review all of the medicines you are taking, including over-the-counter medicines. Ask your health care provider about any side effects that may make you more likely to have a fall. Tell your health care provider if any medicines that you take make you feel dizzy or sleepy.  Osteoporosis screening. Osteoporosis is a condition that causes the bones to get weaker. This can make the bones weak and cause them to break more easily.  Blood pressure screening. Blood pressure changes and medicines to control blood pressure can make you feel dizzy.  Strength and balance checks. Your health care provider may recommend certain tests to check your strength and balance while standing, walking, or changing positions.  Foot health exam. Foot pain and numbness, as well as not wearing proper footwear, can make you more likely to have a fall.  Depression screening. You may be more likely to have a fall if you have a fear of falling, feel emotionally low, or feel unable to do activities that you used to do.  Alcohol use screening. Using too much alcohol can affect your balance and may make you more likely to   have a fall. What actions can I take to lower my risk of falls? General instructions  Talk with your health care provider about your risks for falling. Tell your health care provider if: ? You fall. Be sure to tell your health care provider about all falls, even ones that seem minor. ? You feel dizzy, sleepy, or off-balance.  Take over-the-counter and prescription medicines only as told by your health care provider. These include any  supplements.  Eat a healthy diet and maintain a healthy weight. A healthy diet includes low-fat dairy products, low-fat (lean) meats, and fiber from whole grains, beans, and lots of fruits and vegetables. Home safety  Remove any tripping hazards, such as rugs, cords, and clutter.  Install safety equipment such as grab bars in bathrooms and safety rails on stairs.  Keep rooms and walkways well-lit. Activity   Follow a regular exercise program to stay fit. This will help you maintain your balance. Ask your health care provider what types of exercise are appropriate for you.  If you need a cane or walker, use it as recommended by your health care provider.  Wear supportive shoes that have nonskid soles. Lifestyle  Do not drink alcohol if your health care provider tells you not to drink.  If you drink alcohol, limit how much you have: ? 0-1 drink a day for women. ? 0-2 drinks a day for men.  Be aware of how much alcohol is in your drink. In the [place] one drink equals one typical bottle of beer (12 oz), one-half glass of wine (5 oz), or one shot of hard liquor (1 oz).  Do not use any products that contain nicotine or tobacco, such as cigarettes and e-cigarettes. If you need help quitting, ask your health care provider. Summary  Having a healthy lifestyle and getting preventive care can help to protect your health and wellness after age 68.  Screening and testing are the best way to find a health problem early and help you avoid having a fall. Early diagnosis and treatment give you the best chance for managing medical conditions that are more common for people who are older than age 48.  Falls are a major cause of broken bones and head injuries in people who are older than age 69. Take precautions to prevent a fall at home.  Work with your health care provider to learn what changes you can make to improve your health and wellness and to prevent falls. This information is not intended  to replace advice given to you by your health care provider. Make sure you discuss any questions you have with your health care provider. Document Revised: 02/23/2019 Document Reviewed: 09/15/2017 Elsevier Patient Education  [place].  Hypertension, Adult High blood pressure (hypertension) is when the force of blood pumping through the arteries is too strong. The arteries are the blood vessels that carry blood from the heart throughout the body. Hypertension forces the heart to work harder to pump blood and may cause arteries to become narrow or stiff. Untreated or uncontrolled hypertension can cause a heart attack, heart failure, a stroke, kidney disease, and other problems. A blood pressure reading consists of a higher number over a lower number. Ideally, your blood pressure should be below 120/80. The first ("top") number is called the systolic pressure. It is a measure of the pressure in your arteries as your heart beats. The second ("bottom") number is called the diastolic pressure. It is a measure of the pressure in  your arteries as the heart relaxes. What are the causes? The exact cause of this condition is not known. There are some conditions that result in or are related to high blood pressure. What increases the risk? Some risk factors for high blood pressure are under your control. The following factors may make you more likely to develop this condition:  Smoking.  Having type 2 diabetes mellitus, high cholesterol, or both.  Not getting enough exercise or physical activity.  Being overweight.  Having too much fat, sugar, calories, or salt (sodium) in your diet.  Drinking too much alcohol. Some risk factors for high blood pressure may be difficult or impossible to change. Some of these factors include:  Having chronic kidney disease.  Having a family history of high blood pressure.  Age. Risk increases with age.  Race. You may be at higher risk if you are African  American.  Gender. Men are at higher risk than women before age 20. After age 71, women are at higher risk than men.  Having obstructive sleep apnea.  Stress. What are the signs or symptoms? High blood pressure may not cause symptoms. Very high blood pressure (hypertensive crisis) may cause:  Headache.  Anxiety.  Shortness of breath.  Nosebleed.  Nausea and vomiting.  Vision changes.  Severe chest pain.  Seizures. How is this diagnosed? This condition is diagnosed by measuring your blood pressure while you are seated, with your arm resting on a flat surface, your legs uncrossed, and your feet flat on the floor. The cuff of the blood pressure monitor will be placed directly against the skin of your upper arm at the level of your heart. It should be measured at least twice using the same arm. Certain conditions can cause a difference in blood pressure between your right and left arms. Certain factors can cause blood pressure readings to be lower or higher than normal for a short period of time:  When your blood pressure is higher when you are in a health care provider's office than when you are at home, this is called white coat hypertension. Most people with this condition do not need medicines.  When your blood pressure is higher at home than when you are in a health care provider's office, this is called masked hypertension. Most people with this condition may need medicines to control blood pressure. If you have a high blood pressure reading during one visit or you have normal blood pressure with other risk factors, you may be asked to:  Return on a different day to have your blood pressure checked again.  Monitor your blood pressure at home for 1 week or longer. If you are diagnosed with hypertension, you may have other blood or imaging tests to help your health care provider understand your overall risk for other conditions. How is this treated? This condition is treated by  making healthy lifestyle changes, such as eating healthy foods, exercising more, and reducing your alcohol intake. Your health care provider may prescribe medicine if lifestyle changes are not enough to get your blood pressure under control, and if:  Your systolic blood pressure is above 130.  Your diastolic blood pressure is above 80. Your personal target blood pressure may vary depending on your medical conditions, your age, and other factors. Follow these instructions at home: Eating and drinking   Eat a diet that is high in fiber and potassium, and low in sodium, added sugar, and fat. An example eating plan is called the DASH (  Dietary Approaches to Stop Hypertension) diet. To eat this way: ? Eat plenty of fresh fruits and vegetables. Try to fill one half of your plate at each meal with fruits and vegetables. ? Eat whole grains, such as whole-wheat pasta, brown rice, or whole-grain bread. Fill about one fourth of your plate with whole grains. ? Eat or drink low-fat dairy products, such as skim milk or low-fat yogurt. ? Avoid fatty cuts of meat, processed or cured meats, and poultry with skin. Fill about one fourth of your plate with lean proteins, such as fish, chicken without skin, beans, eggs, or tofu. ? Avoid pre-made and processed foods. These tend to be higher in sodium, added sugar, and fat.  Reduce your daily sodium intake. Most people with hypertension should eat less than 1,500 mg of sodium a day.  Do not drink alcohol if: ? Your health care provider tells you not to drink. ? You are pregnant, may be pregnant, or are planning to become pregnant.  If you drink alcohol: ? Limit how much you use to:  0-1 drink a day for women.  0-2 drinks a day for men. ? Be aware of how much alcohol is in your drink. In the [place] one drink equals one 12 oz bottle of beer (355 mL), one 5 oz glass of wine (148 mL), or one 1 oz glass of hard liquor (44 mL). Lifestyle   Work with your health  care provider to maintain a healthy body weight or to lose weight. Ask what an ideal weight is for you.  Get at least 30 minutes of exercise most days of the week. Activities may include walking, swimming, or biking.  Include exercise to strengthen your muscles (resistance exercise), such as Pilates or lifting weights, as part of your weekly exercise routine. Try to do these types of exercises for 30 minutes at least 3 days a week.  Do not use any products that contain nicotine or tobacco, such as cigarettes, e-cigarettes, and chewing tobacco. If you need help quitting, ask your health care provider.  Monitor your blood pressure at home as told by your health care provider.  Keep all follow-up visits as told by your health care provider. This is important. Medicines  Take over-the-counter and prescription medicines only as told by your health care provider. Follow directions carefully. Blood pressure medicines must be taken as prescribed.  Do not skip doses of blood pressure medicine. Doing this puts you at risk for problems and can make the medicine less effective.  Ask your health care provider about side effects or reactions to medicines that you should watch for. Contact a health care provider if you:  Think you are having a reaction to a medicine you are taking.  Have headaches that keep coming back (recurring).  Feel dizzy.  Have swelling in your ankles.  Have trouble with your vision. Get help right away if you:  Develop a severe headache or confusion.  Have unusual weakness or numbness.  Feel faint.  Have severe pain in your chest or abdomen.  Vomit repeatedly.  Have trouble breathing. Summary  Hypertension is when the force of blood pumping through your arteries is too strong. If this condition is not controlled, it may put you at risk for serious complications.  Your personal target blood pressure may vary depending on your medical conditions, your age, and  other factors. For most people, a normal blood pressure is less than 120/80.  Hypertension is treated with lifestyle changes, medicines,  or a combination of both. Lifestyle changes include losing weight, eating a healthy, low-sodium diet, exercising more, and limiting alcohol. This information is not intended to replace advice given to you by your health care provider. Make sure you discuss any questions you have with your health care provider. Document Revised: 07/13/2018 Document Reviewed: 07/13/2018 Elsevier Patient Education  2020 [employer].

## 2020-09-10 NOTE — Progress Notes (Signed)
[name] 74 y.o.   Chief Complaint  Patient presents with  . Hypertension    follow up   . Transitions Of Care    former Dr Pamella Pert    HISTORY OF PRESENT ILLNESS: This is a 74 y.o. male first visit with me.  Used to see Dr. Pamella Pert. Here to establish care. Multiple medical problems.  Problem list reviewed with patient. Medication list reviewed with patient.  Sees cardiologist and endocrinologist on a regular basis. Has concerns about his blood pressure, on medication, normal blood pressure readings however. Controlled diabetes with hemoglobin A1c less than 7 in multiple previous occasions. Has concerns about potassium however potassium levels normal recently. Has history of hypercalcemia which is being worked up by Dr. Loanne Drilling. Has no complaints or medical concerns today. Fully vaccinated against Covid plus booster.  HPI   Prior to Admission medications   Medication Sig Start Date End Date Taking? Authorizing Provider  acetaminophen (TYLENOL) 650 MG CR tablet Take 650 mg by mouth 5 (five) times daily.    Yes [provider]  aspirin 81 MG tablet Take 81 mg by mouth every evening.    Yes [provider]  bimatoprost (LUMIGAN) 0.01 % SOLN Place 1 drop into both eyes every evening.   Yes [provider]  brimonidine-timolol (COMBIGAN) 0.2-0.5 % ophthalmic solution Place 1 drop into both eyes 2 (two) times daily.   Yes [provider]  brinzolamide (AZOPT) 1 % ophthalmic suspension Place 1 drop into both eyes 2 (two) times daily.    Yes [provider]  empagliflozin (JARDIANCE) 25 MG TABS tablet Take 25 mg by mouth daily. 07/31/19  Yes Rutherford Guys, MD  FREESTYLE LITE test strip USE TO TEST BLOOD SUGAR ONCE DAILY 07/21/19  Yes Delia Chimes A, MD  Lancets (FREESTYLE) lancets Use to test blood sugar once daily 07/26/19  Yes Rutherford Guys, MD  losartan (COZAAR) 100 MG tablet Take 1 tablet (100 mg total) by mouth daily. 07/23/20   Yes Rutherford Guys, MD  Magnesium 125 MG CAPS Take by mouth. Every 2-3 days   Yes [provider]  metoprolol tartrate (LOPRESSOR) 25 MG tablet TAKE 1 TABLET BY MOUTH TWICE DAILY 04/11/20  Yes Adrian Prows, MD  Milk Thistle 250 MG CAPS Take by mouth.   Yes [provider]  Multiple Vitamins-Minerals (PRESERVISION AREDS 2) CAPS Take 1 capsule by mouth 2 (two) times daily.   Yes [provider]  Omega-3 Fatty Acids (FISH OIL) 1000 MG CAPS Take by mouth.   Yes [provider]  OVER THE COUNTER MEDICATION Take 1,000 mg by mouth daily. Microlactin 1000 mg Supplement   Yes [provider]  OVER THE COUNTER MEDICATION Take 2-3 drops by mouth See admin instructions. CBD Hemp Oil - Place 3 drops in mouth in the morning, place 2 drops in mouth at lunch and dinner, and place 3 drops in mouth at bedtime.   Yes [provider]  rosuvastatin (CRESTOR) 20 MG tablet Take 1 tablet (20 mg total) by mouth daily. 04/09/20  Yes Rutherford Guys, MD  sitaGLIPtin (JANUVIA) 100 MG tablet Take 1 tablet (100 mg total) by mouth daily. 07/31/19  Yes Rutherford Guys, MD  spironolactone (ALDACTONE) 25 MG tablet Take 1 tablet (25 mg total) by mouth daily. 08/13/20  Yes Rutherford Guys, MD  SUMAtriptan (IMITREX) 50 MG tablet TAKE 1 TABLET BY MOUTH EVERY 2 HOURS AS NEEDED FOR MIGRAINE 08/12/20  Yes Grant Fontana  M, MD  nitroGLYCERIN (NITROSTAT) 0.4 MG SL tablet Place 1 tablet (0.4 mg total) under the tongue every 5 (five) minutes as needed for up to 25 days for chest pain. Patient not taking: Reported on 01/31/2020 01/09/19 01/10/20  Adrian Prows, MD    Allergies  Allergen Reactions  . Amaryl Other (See Comments)    Gives pt migraines  . Bisacodyl     Causes headaches  . Other Other (See Comments)    Pistachios, lima beans, and some types of chocolate cause migraines  Nasal corticosteroids causes nose bleeds  . Skelaxin [Metaxalone] Dermatitis and Other (See Comments)     Cherry red rash with sloughing off of skin at left upper chest/shoulder  . Stool Softener [Dss] Other (See Comments)    Causes migraines  . Norco [Hydrocodone-Acetaminophen] Other (See Comments)    Ineffective  . Pistachio Nut (Diagnostic) Other (See Comments)     In  Addition :Lima beans, milk chocolate   . Levofloxacin Nausea Only  . Lisinopril Cough  . Metformin And Related Diarrhea    Patient Active Problem List   Diagnosis Date Noted  . Hypercalcemia 05/30/2020  . 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 S/P lumbar spinal fusion 04/09/2017  . Type 2 diabetes mellitus without complication, without long-term current use of insulin ([place]) 02/14/2016  . Degenerative disc disease, lumbar 02/14/2016  . HTN (hypertension) 02/22/2012  . Obstructive sleep apnea 10/14/2010  . Migraine headache 10/14/2010  . GLAUCOMA 10/14/2010  . Coronary atherosclerosis 10/14/2010  . DIVERTICULAR DISEASE 10/14/2010    Past Medical History:  Diagnosis Date  . Arthritis    DDD, scoliosis, stenosis    . 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 Calcium blood increased   . Cancer (HCC)    skin ca, posterior R ear   ([place])  . Complication of anesthesia    difficulty intubation , anterior larynx, easy mask ventilation  . Coronary artery disease   . Diabetes mellitus without complication ([place])   . Difficult intubation   . Diverticulitis   . Glaucoma   . Headache(784.0)    migraines  . History of kidney stones   . Hyperlipemia   . Hypertension   . 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 Sleep apnea    CPAP still in use q night, & for naps thru out the day, pt. reports that last study was neg for sleep apnea but he still uses the device    . Spinal stenosis   . 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 Ventricular dilatation     Right    Past Surgical History:  Procedure Laterality Date  . APPENDECTOMY  1991  . BOWEL RESECTION  1985  . CARDIAC CATHETERIZATION  2006  . CATARACT EXTRACTION Right   . CHOLECYSTECTOMY  1990  . COLON SURGERY     bowel resection, for diverticulitis   . COLONOSCOPY N/A 06/15/2013   Procedure: COLONOSCOPY;  Surgeon: Beryle Beams, MD;  Location: [facility];  Service: Endoscopy;  Laterality: N/A;  . COLONOSCOPY WITH PROPOFOL N/A 07/01/2018   Procedure: COLONOSCOPY WITH PROPOFOL;  Surgeon: Carol Ada, MD;  Location: [facility];  Service: Endoscopy;  Laterality: N/A;  . CORONARY ANGIOPLASTY     STENT PLACED  . LAMINECTOMY WITH POSTERIOR LATERAL ARTHRODESIS LEVEL 1 N/A 04/09/2017   Procedure: Lumbar TwoThree,Lumbar  Three-Four,Lumbar Four-Five Laminectomy with Lumbar Five-Sacral One Posterior lateral fusion;  Surgeon: Eustace Moore, MD;  Location: [facility];  Service: Neurosurgery;  Laterality: N/A;  . LITHOTRIPSY    . POLYPECTOMY  07/01/2018   Procedure: POLYPECTOMY;  Surgeon: Carol Ada, MD;  Location: [facility];  Service: Endoscopy;;  . SPINAL CORD STIMULATOR INSERTION N/A 03/20/2016   Procedure: LUMBAR SPINAL CORD STIMULATOR INSERTION;  Surgeon: Clydell Hakim, MD;  Location: [facility];  Service: Neurosurgery;  Laterality: N/A;  . TONSILLECTOMY    . [place] ?    Social History   Socioeconomic History  . Marital status: Married    Spouse name: Not on file  . Number of children: 0  . Years of education: Masters +  . Highest education level: Not on file  Occupational History  . Occupation: Retired  Tobacco Use  . Smoking status: Never Smoker  . Smokeless tobacco: Never Used  Vaping Use  . Vaping Use: Never used  Substance and Sexual Activity  . Alcohol use: Yes    Alcohol/week: 0.0 standard drinks    Comment: seldom   . Drug use: No  . Sexual activity: Yes  Other Topics Concern  . Not on file  Social History Narrative   Marital status:  married x 1969      Children: none      Lives: with wife, 1 dog      Employment: retired at age 50.  Education music      Tobacco: none      Alcohol: rare      Exercise: stationary bike in 2018; rare use in 2018 due to sick dog.      ADLs: independent with ADLs; drives      Advanced Directives:    2 cups of coffee a day, soda or tea about 3 times a week.    Social Determinants of Health   Financial Resource Strain:   . Difficulty of Paying Living Expenses: Not on file  Food Insecurity:   . Worried About Charity fundraiser in the Last Year: Not on file  . Ran Out of Food in the Last Year: Not on file  Transportation Needs:   . Lack of Transportation (Medical): Not on file  . Lack of Transportation (Non-Medical): Not on file  Physical Activity:   . Days of Exercise per Week: Not on file  . Minutes of Exercise per Session: Not on file  Stress:   . Feeling of Stress : Not on file  Social Connections:   . Frequency of Communication with Friends and Family: Not on file  . Frequency of Social Gatherings with Friends and Family: Not on file  . Attends Religious Services: Not on file  . Active Member of Clubs or Organizations: Not on file  . Attends Archivist Meetings: Not on file  . Marital Status: Not on file  Intimate Partner Violence:   . Fear of Current or Ex-Partner: Not on file  . Emotionally Abused: Not on file  . Physically Abused: Not on file  . Sexually Abused: Not on file    Family History  Problem Relation Age of Onset  . Heart disease Father   . Heart attack Father   . Heart disease Paternal Grandmother   . Hypercalcemia Neg Hx      Review of Systems  Constitutional: Negative.  Negative for chills and fever.  HENT: Negative.  Negative for congestion and sore throat.  Respiratory: Negative.  Negative for cough and shortness of breath.   Cardiovascular: Negative.  Negative for chest pain and palpitations.  Gastrointestinal: Negative.  Negative for  abdominal pain, blood in stool, diarrhea, melena, nausea and vomiting.  Genitourinary: Negative.  Negative for dysuria and hematuria.  Musculoskeletal: Negative.  Negative for back pain, joint pain and neck pain.  Skin: Negative.  Negative for rash.  Neurological: Negative.  Negative for dizziness and headaches.  All other systems reviewed and are negative.   Today's Vitals   09/10/20 0822  BP: 126/69  Pulse: 61  Resp: 16  Temp: (!) 96.8 F (36 C)  TempSrc: Temporal  SpO2: 96%  Weight: 201 lb (91.2 kg)  Height: 6' (1.829 m)   Body mass index is 27.26 kg/m. BP Readings from Last 3 Encounters:  09/10/20 126/69  08/27/20 120/82  08/13/20 130/76   Wt Readings from Last 3 Encounters:  09/10/20 201 lb (91.2 kg)  08/27/20 200 lb 12.8 oz (91.1 kg)  08/13/20 203 lb 9.6 oz (92.4 kg)    Physical Exam Vitals reviewed.  Constitutional:      Appearance: Normal appearance.  HENT:     Head: Normocephalic.  Eyes:     Extraocular Movements: Extraocular movements intact.  Cardiovascular:     Rate and Rhythm: Normal rate.  Pulmonary:     Effort: Pulmonary effort is normal.  Skin:    General: Skin is warm and dry.  Neurological:     Mental Status: He is alert and oriented to person, place, and time.  Psychiatric:        Mood and Affect: Mood normal.        Behavior: Behavior normal.      ASSESSMENT & PLAN:  Ciaran was seen today for hypertension and transitions of care.  Diagnoses and all orders for this visit:  Encounter to establish care  Essential hypertension  Type 2 diabetes mellitus without complication, without long-term current use of insulin ([place])  Hypercalcemia  Dyslipidemia  Atherosclerosis of native coronary artery of native heart with stable angina pectoris [facility])    Patient Instructions       If you have lab work done today you will be contacted with your lab results within the next 2 weeks.  If you have not heard from [place] then please  contact us. The fastest way to get your results is to register for My Chart.   IF you received an x-ray today, you will receive an invoice from [facility]. Please contact [facility] at [phone] with questions or concerns regarding your invoice.   IF you received labwork today, you will receive an invoice from [place]. Please contact [facility] at [phone] with questions or concerns regarding your invoice.   Our billing staff will not be able to assist you with questions regarding bills from these companies.  You will be contacted with the lab results as soon as they are available. The fastest way to get your results is to activate your My Chart account. Instructions are located on the last page of this paperwork. If you have not heard from [place] regarding the results in 2 weeks, please contact this office.     Health Maintenance After Age 69 After age 75, you are at a higher risk for certain long-term diseases and infections as well as injuries from falls. Falls are a major cause of broken bones and head injuries in people who are older than age 77. Getting regular preventive care can help to keep  you healthy and well. Preventive care includes getting regular testing and making lifestyle changes as recommended by your health care provider. Talk with your health care provider about:  Which screenings and tests you should have. A screening is a test that checks for a disease when you have no symptoms.  A diet and exercise plan that is right for you. What should I know about screenings and tests to prevent falls? Screening and testing are the best ways to find a health problem early. Early diagnosis and treatment give you the best chance of managing medical conditions that are common after age 85. Certain conditions and lifestyle choices may make you more likely to have a fall. Your health care provider may recommend:  Regular vision checks. Poor vision and conditions such  as cataracts can make you more likely to have a fall. If you wear glasses, make sure to get your prescription updated if your vision changes.  Medicine review. Work with your health care provider to regularly review all of the medicines you are taking, including over-the-counter medicines. Ask your health care provider about any side effects that may make you more likely to have a fall. Tell your health care provider if any medicines that you take make you feel dizzy or sleepy.  Osteoporosis screening. Osteoporosis is a condition that causes the bones to get weaker. This can make the bones weak and cause them to break more easily.  Blood pressure screening. Blood pressure changes and medicines to control blood pressure can make you feel dizzy.  Strength and balance checks. Your health care provider may recommend certain tests to check your strength and balance while standing, walking, or changing positions.  Foot health exam. Foot pain and numbness, as well as not wearing proper footwear, can make you more likely to have a fall.  Depression screening. You may be more likely to have a fall if you have a fear of falling, feel emotionally low, or feel unable to do activities that you used to do.  Alcohol use screening. Using too much alcohol can affect your balance and may make you more likely to have a fall. What actions can I take to lower my risk of falls? General instructions  Talk with your health care provider about your risks for falling. Tell your health care provider if: ? You fall. Be sure to tell your health care provider about all falls, even ones that seem minor. ? You feel dizzy, sleepy, or off-balance.  Take over-the-counter and prescription medicines only as told by your health care provider. These include any supplements.  Eat a healthy diet and maintain a healthy weight. A healthy diet includes low-fat dairy products, low-fat (lean) meats, and fiber from whole grains, beans,  and lots of fruits and vegetables. Home safety  Remove any tripping hazards, such as rugs, cords, and clutter.  Install safety equipment such as grab bars in bathrooms and safety rails on stairs.  Keep rooms and walkways well-lit. Activity   Follow a regular exercise program to stay fit. This will help you maintain your balance. Ask your health care provider what types of exercise are appropriate for you.  If you need a cane or walker, use it as recommended by your health care provider.  Wear supportive shoes that have nonskid soles. Lifestyle  Do not drink alcohol if your health care provider tells you not to drink.  If you drink alcohol, limit how much you have: ? 0-1 drink a day for women. ?  0-2 drinks a day for men.  Be aware of how much alcohol is in your drink. In the [place] one drink equals one typical bottle of beer (12 oz), one-half glass of wine (5 oz), or one shot of hard liquor (1 oz).  Do not use any products that contain nicotine or tobacco, such as cigarettes and e-cigarettes. If you need help quitting, ask your health care provider. Summary  Having a healthy lifestyle and getting preventive care can help to protect your health and wellness after age 27.  Screening and testing are the best way to find a health problem early and help you avoid having a fall. Early diagnosis and treatment give you the best chance for managing medical conditions that are more common for people who are older than age 82.  Falls are a major cause of broken bones and head injuries in people who are older than age 67. Take precautions to prevent a fall at home.  Work with your health care provider to learn what changes you can make to improve your health and wellness and to prevent falls. This information is not intended to replace advice given to you by your health care provider. Make sure you discuss any questions you have with your health care provider. Document Revised: 02/23/2019  Document Reviewed: 09/15/2017 Elsevier Patient Education  [place].  Hypertension, Adult High blood pressure (hypertension) is when the force of blood pumping through the arteries is too strong. The arteries are the blood vessels that carry blood from the heart throughout the body. Hypertension forces the heart to work harder to pump blood and may cause arteries to become narrow or stiff. Untreated or uncontrolled hypertension can cause a heart attack, heart failure, a stroke, kidney disease, and other problems. A blood pressure reading consists of a higher number over a lower number. Ideally, your blood pressure should be below 120/80. The first ("top") number is called the systolic pressure. It is a measure of the pressure in your arteries as your heart beats. The second ("bottom") number is called the diastolic pressure. It is a measure of the pressure in your arteries as the heart relaxes. What are the causes? The exact cause of this condition is not known. There are some conditions that result in or are related to high blood pressure. What increases the risk? Some risk factors for high blood pressure are under your control. The following factors may make you more likely to develop this condition:  Smoking.  Having type 2 diabetes mellitus, high cholesterol, or both.  Not getting enough exercise or physical activity.  Being overweight.  Having too much fat, sugar, calories, or salt (sodium) in your diet.  Drinking too much alcohol. Some risk factors for high blood pressure may be difficult or impossible to change. Some of these factors include:  Having chronic kidney disease.  Having a family history of high blood pressure.  Age. Risk increases with age.  Race. You may be at higher risk if you are African American.  Gender. Men are at higher risk than women before age 2. After age 72, women are at higher risk than men.  Having obstructive sleep apnea.  Stress. What  are the signs or symptoms? High blood pressure may not cause symptoms. Very high blood pressure (hypertensive crisis) may cause:  Headache.  Anxiety.  Shortness of breath.  Nosebleed.  Nausea and vomiting.  Vision changes.  Severe chest pain.  Seizures. How is this diagnosed? This condition is diagnosed  by measuring your blood pressure while you are seated, with your arm resting on a flat surface, your legs uncrossed, and your feet flat on the floor. The cuff of the blood pressure monitor will be placed directly against the skin of your upper arm at the level of your heart. It should be measured at least twice using the same arm. Certain conditions can cause a difference in blood pressure between your right and left arms. Certain factors can cause blood pressure readings to be lower or higher than normal for a short period of time:  When your blood pressure is higher when you are in a health care provider's office than when you are at home, this is called white coat hypertension. Most people with this condition do not need medicines.  When your blood pressure is higher at home than when you are in a health care provider's office, this is called masked hypertension. Most people with this condition may need medicines to control blood pressure. If you have a high blood pressure reading during one visit or you have normal blood pressure with other risk factors, you may be asked to:  Return on a different day to have your blood pressure checked again.  Monitor your blood pressure at home for 1 week or longer. If you are diagnosed with hypertension, you may have other blood or imaging tests to help your health care provider understand your overall risk for other conditions. How is this treated? This condition is treated by making healthy lifestyle changes, such as eating healthy foods, exercising more, and reducing your alcohol intake. Your health care provider may prescribe medicine if  lifestyle changes are not enough to get your blood pressure under control, and if:  Your systolic blood pressure is above 130.  Your diastolic blood pressure is above 80. Your personal target blood pressure may vary depending on your medical conditions, your age, and other factors. Follow these instructions at home: Eating and drinking   Eat a diet that is high in fiber and potassium, and low in sodium, added sugar, and fat. An example eating plan is called the DASH (Dietary Approaches to Stop Hypertension) diet. To eat this way: ? Eat plenty of fresh fruits and vegetables. Try to fill one half of your plate at each meal with fruits and vegetables. ? Eat whole grains, such as whole-wheat pasta, brown rice, or whole-grain bread. Fill about one fourth of your plate with whole grains. ? Eat or drink low-fat dairy products, such as skim milk or low-fat yogurt. ? Avoid fatty cuts of meat, processed or cured meats, and poultry with skin. Fill about one fourth of your plate with lean proteins, such as fish, chicken without skin, beans, eggs, or tofu. ? Avoid pre-made and processed foods. These tend to be higher in sodium, added sugar, and fat.  Reduce your daily sodium intake. Most people with hypertension should eat less than 1,500 mg of sodium a day.  Do not drink alcohol if: ? Your health care provider tells you not to drink. ? You are pregnant, may be pregnant, or are planning to become pregnant.  If you drink alcohol: ? Limit how much you use to:  0-1 drink a day for women.  0-2 drinks a day for men. ? Be aware of how much alcohol is in your drink. In the [place] one drink equals one 12 oz bottle of beer (355 mL), one 5 oz glass of wine (148 mL), or one 1 oz glass of  hard liquor (44 mL). Lifestyle   Work with your health care provider to maintain a healthy body weight or to lose weight. Ask what an ideal weight is for you.  Get at least 30 minutes of exercise most days of the week.  Activities may include walking, swimming, or biking.  Include exercise to strengthen your muscles (resistance exercise), such as Pilates or lifting weights, as part of your weekly exercise routine. Try to do these types of exercises for 30 minutes at least 3 days a week.  Do not use any products that contain nicotine or tobacco, such as cigarettes, e-cigarettes, and chewing tobacco. If you need help quitting, ask your health care provider.  Monitor your blood pressure at home as told by your health care provider.  Keep all follow-up visits as told by your health care provider. This is important. Medicines  Take over-the-counter and prescription medicines only as told by your health care provider. Follow directions carefully. Blood pressure medicines must be taken as prescribed.  Do not skip doses of blood pressure medicine. Doing this puts you at risk for problems and can make the medicine less effective.  Ask your health care provider about side effects or reactions to medicines that you should watch for. Contact a health care provider if you:  Think you are having a reaction to a medicine you are taking.  Have headaches that keep coming back (recurring).  Feel dizzy.  Have swelling in your ankles.  Have trouble with your vision. Get help right away if you:  Develop a severe headache or confusion.  Have unusual weakness or numbness.  Feel faint.  Have severe pain in your chest or abdomen.  Vomit repeatedly.  Have trouble breathing. Summary  Hypertension is when the force of blood pumping through your arteries is too strong. If this condition is not controlled, it may put you at risk for serious complications.  Your personal target blood pressure may vary depending on your medical conditions, your age, and other factors. For most people, a normal blood pressure is less than 120/80.  Hypertension is treated with lifestyle changes, medicines, or a combination of both.  Lifestyle changes include losing weight, eating a healthy, low-sodium diet, exercising more, and limiting alcohol. This information is not intended to replace advice given to you by your health care provider. Make sure you discuss any questions you have with your health care provider. Document Revised: 07/13/2018 Document Reviewed: 07/13/2018 Elsevier Patient Education  2020 Elsevier Inc.      Agustina Caroli, MD [facility]

## 2020-09-17 LAB — PTH-RELATED PEPTIDE: PTH-Related Protein (PTH-RP): 12 pg/mL (ref 11–20)

## 2020-09-18 ENCOUNTER — Other Ambulatory Visit: Payer: Self-pay | Admitting: Endocrinology

## 2020-09-19 ENCOUNTER — Other Ambulatory Visit: Payer: Medicare Other

## 2020-09-23 ENCOUNTER — Other Ambulatory Visit: Payer: Medicare Other

## 2020-09-24 LAB — EXTRA SPECIMEN

## 2020-09-24 LAB — CALCIUM, URINE, 24 HOUR: Calcium, 24H Urine: 596 mg/24 h — ABNORMAL HIGH

## 2020-10-08 ENCOUNTER — Ambulatory Visit: Payer: Medicare Other | Admitting: Family Medicine

## 2020-10-17 DIAGNOSIS — J309 Allergic rhinitis, unspecified: Secondary | ICD-10-CM | POA: Diagnosis not present

## 2020-10-17 DIAGNOSIS — I10 Essential (primary) hypertension: Secondary | ICD-10-CM | POA: Diagnosis not present

## 2020-10-17 DIAGNOSIS — I25119 Atherosclerotic heart disease of native coronary artery with unspecified angina pectoris: Secondary | ICD-10-CM | POA: Diagnosis not present

## 2020-10-17 DIAGNOSIS — G43009 Migraine without aura, not intractable, without status migrainosus: Secondary | ICD-10-CM | POA: Diagnosis not present

## 2020-10-17 DIAGNOSIS — B360 Pityriasis versicolor: Secondary | ICD-10-CM | POA: Diagnosis not present

## 2020-10-17 DIAGNOSIS — H409 Unspecified glaucoma: Secondary | ICD-10-CM | POA: Diagnosis not present

## 2020-10-17 DIAGNOSIS — E1165 Type 2 diabetes mellitus with hyperglycemia: Secondary | ICD-10-CM | POA: Diagnosis not present

## 2020-10-17 DIAGNOSIS — E7849 Other hyperlipidemia: Secondary | ICD-10-CM | POA: Diagnosis not present

## 2020-10-17 DIAGNOSIS — R232 Flushing: Secondary | ICD-10-CM | POA: Diagnosis not present

## 2020-10-17 DIAGNOSIS — G4733 Obstructive sleep apnea (adult) (pediatric): Secondary | ICD-10-CM | POA: Diagnosis not present

## 2020-10-17 DIAGNOSIS — K5790 Diverticulosis of intestine, part unspecified, without perforation or abscess without bleeding: Secondary | ICD-10-CM | POA: Diagnosis not present

## 2020-10-22 DIAGNOSIS — H401133 Primary open-angle glaucoma, bilateral, severe stage: Secondary | ICD-10-CM | POA: Diagnosis not present

## 2020-10-22 DIAGNOSIS — H2512 Age-related nuclear cataract, left eye: Secondary | ICD-10-CM | POA: Diagnosis not present

## 2020-10-24 ENCOUNTER — Other Ambulatory Visit: Payer: Self-pay

## 2020-10-24 DIAGNOSIS — I25118 Atherosclerotic heart disease of native coronary artery with other forms of angina pectoris: Secondary | ICD-10-CM

## 2020-10-24 MED ORDER — NITROGLYCERIN 0.4 MG SL SUBL: 0.4000 mg | SUBLINGUAL_TABLET | SUBLINGUAL | 0 refills | Status: DC | PRN

## 2020-10-24 NOTE — Progress Notes (Signed)
Labs 10/17/2020:  Serum glucose 141, BUN 25, creatinine 0.90, EGFR >60 mL, potassium 4.1, CMP otherwise normal,  Total cholesterol 160, triglycerides 391, HDL 34, LDL 65.  Magnesium 2.4.  TSH normal.

## 2020-11-14 DIAGNOSIS — E785 Hyperlipidemia, unspecified: Secondary | ICD-10-CM | POA: Diagnosis not present

## 2020-11-14 DIAGNOSIS — I2510 Atherosclerotic heart disease of native coronary artery without angina pectoris: Secondary | ICD-10-CM | POA: Diagnosis not present

## 2020-11-14 DIAGNOSIS — R0982 Postnasal drip: Secondary | ICD-10-CM | POA: Diagnosis not present

## 2020-11-14 DIAGNOSIS — I10 Essential (primary) hypertension: Secondary | ICD-10-CM | POA: Diagnosis not present

## 2020-11-14 DIAGNOSIS — E119 Type 2 diabetes mellitus without complications: Secondary | ICD-10-CM | POA: Diagnosis not present

## 2020-11-15 ENCOUNTER — Encounter (HOSPITAL_COMMUNITY): Payer: Self-pay | Admitting: Emergency Medicine

## 2020-11-15 ENCOUNTER — Other Ambulatory Visit: Payer: Self-pay

## 2020-11-15 DIAGNOSIS — Z955 Presence of coronary angioplasty implant and graft: Secondary | ICD-10-CM | POA: Insufficient documentation

## 2020-11-15 DIAGNOSIS — Z7982 Long term (current) use of aspirin: Secondary | ICD-10-CM | POA: Diagnosis not present

## 2020-11-15 DIAGNOSIS — E1139 Type 2 diabetes mellitus with other diabetic ophthalmic complication: Secondary | ICD-10-CM | POA: Diagnosis not present

## 2020-11-15 DIAGNOSIS — E1169 Type 2 diabetes mellitus with other specified complication: Secondary | ICD-10-CM | POA: Diagnosis not present

## 2020-11-15 DIAGNOSIS — I2510 Atherosclerotic heart disease of native coronary artery without angina pectoris: Secondary | ICD-10-CM | POA: Diagnosis not present

## 2020-11-15 DIAGNOSIS — Z7984 Long term (current) use of oral hypoglycemic drugs: Secondary | ICD-10-CM | POA: Insufficient documentation

## 2020-11-15 DIAGNOSIS — E785 Hyperlipidemia, unspecified: Secondary | ICD-10-CM | POA: Diagnosis not present

## 2020-11-15 DIAGNOSIS — Z79899 Other long term (current) drug therapy: Secondary | ICD-10-CM | POA: Diagnosis not present

## 2020-11-15 DIAGNOSIS — I2583 Coronary atherosclerosis due to lipid rich plaque: Secondary | ICD-10-CM | POA: Insufficient documentation

## 2020-11-15 DIAGNOSIS — Z85828 Personal history of other malignant neoplasm of skin: Secondary | ICD-10-CM | POA: Insufficient documentation

## 2020-11-15 DIAGNOSIS — I10 Essential (primary) hypertension: Secondary | ICD-10-CM | POA: Insufficient documentation

## 2020-11-15 DIAGNOSIS — L089 Local infection of the skin and subcutaneous tissue, unspecified: Secondary | ICD-10-CM | POA: Diagnosis not present

## 2020-11-15 NOTE — ED Triage Notes (Signed)
Pt reports a 74 year old cyst on his forehead that he said started getting red and inflamed on Sunday. Denies fever.

## 2020-11-16 ENCOUNTER — Emergency Department (HOSPITAL_COMMUNITY)
Admission: EM | Admit: 2020-11-16 | Discharge: 2020-11-16 | Disposition: A | Discharge status: Home / Self Care | Payer: Medicare Other | Attending: Emergency Medicine | Admitting: Emergency Medicine

## 2020-11-16 DIAGNOSIS — L089 Local infection of the skin and subcutaneous tissue, unspecified: Secondary | ICD-10-CM

## 2020-11-16 MED ORDER — DOXYCYCLINE HYCLATE 100 MG PO CAPS: 100.0000 mg | ORAL_CAPSULE | Freq: Two times a day (BID) | ORAL | 0 refills | Status: AC

## 2020-11-16 NOTE — Discharge Instructions (Addendum)
Please take the antibiotic I prescribed you as directed.  Please drink plenty of water, follow-up with your primary care doctor on Monday or Tuesday.  Please do warm compresses 2-3 times per day as we discussed.

## 2020-11-16 NOTE — ED Provider Notes (Signed)
[facility] DEPT Provider Note   CSN: [id number] Arrival date & time: 11/15/20  1843     History Chief Complaint  Patient presents with  . Cyst    [name] is a 75 y.o. male.  HPI Patient is a 75 year old male with past medical history detailed below presented today for which he states has been present for many many years however he states that on Sunday he noticed that he got warm to touch, red and swollen.   He states that on Sunday also was itching somewhat.  He states that by Friday has become quite painful and red and yesterday used a needle to puncture the area.  He states that he had some grayish fluid that came out of the area.  However he states that since that time is, more painful.  He has taken no other medications.  He has tried no other interventions.  Denies any aggravating mitigating factors apart from worse with touch.  He denies any history of fungal scalp infection.  Denies any history of immunocompromise.    Past Medical History:  Diagnosis Date  . Arthritis    DDD, scoliosis, stenosis    . 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 Calcium blood increased   . Cancer (HCC)    skin ca, posterior R ear   (BENIGN)  . Complication of anesthesia    difficulty intubation , anterior larynx, easy mask ventilation  . Coronary artery disease   . Diabetes mellitus without complication ([place])   . Difficult intubation   . Diverticulitis   . Glaucoma   . Headache(784.0)    migraines  . History of kidney stones   . Hyperlipemia   . Hypertension   . 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 Sleep apnea    CPAP still in use q night, & for naps thru out the day, pt. reports that last study was neg for sleep apnea but he still uses the device    . Spinal stenosis   . 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 Squamous cell carcinoma of skin 07/18/2020   sup  & infil-left buccal cheek  . Ventricular dilatation    Right    Patient Active Problem List   Diagnosis Date Noted  . Hypercalcemia 05/30/2020  . 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 S/P lumbar spinal fusion 04/09/2017  . Type 2 diabetes mellitus without complication, without long-term current use of insulin ([place]) 02/14/2016  . Degenerative disc disease, lumbar 02/14/2016  . HTN (hypertension) 02/22/2012  . Obstructive sleep apnea 10/14/2010  . Migraine headache 10/14/2010  . GLAUCOMA 10/14/2010  . Coronary atherosclerosis 10/14/2010  . DIVERTICULAR DISEASE 10/14/2010    Past Surgical History:  Procedure Laterality Date  . APPENDECTOMY  1991  . BOWEL RESECTION  1985  . CARDIAC CATHETERIZATION  2006  . CATARACT EXTRACTION Right   . CHOLECYSTECTOMY  1990  . COLON SURGERY     bowel resection, for diverticulitis   . COLONOSCOPY N/A 06/15/2013   Procedure: COLONOSCOPY;  Surgeon: Beryle Beams, MD;  Location: [facility];  Service: Endoscopy;  Laterality: N/A;  . COLONOSCOPY WITH PROPOFOL N/A 07/01/2018   Procedure: COLONOSCOPY WITH PROPOFOL;  Surgeon: Carol Ada, MD;  Location: [facility];  Service: Endoscopy;  Laterality: N/A;  . CORONARY ANGIOPLASTY     STENT PLACED  . LAMINECTOMY WITH POSTERIOR LATERAL ARTHRODESIS LEVEL 1 N/A 04/09/2017  Procedure: Lumbar TwoThree,Lumbar Three-Four,Lumbar Four-Five Laminectomy with Lumbar Five-Sacral One Posterior lateral fusion;  Surgeon: Tia Alert, MD;  Location[facility] OR;  Service: Neurosurgery;  Laterality: N/A;  . LITHOTRIPSY    . POLYPECTOMY  07/01/2018   Procedure: POLYPECTOMY;  Surgeon: Jeani Hawking, MD;  Location: [facility]COPY;  Service: Endoscopy;;  . SPINAL CORD STIMULATOR INSERTION N/A 03/20/2016   Procedure: LUMBAR SPINAL CORD STIMULATOR INSERTION;  Surgeon: Odette Fraction, MD;  Location: [facility]URO ORS;  Service: Neurosurgery;  Laterality: N/A;  . TONSILLECTOMY    . UVULA     BIUVULA JOINED 1950 ?        Family History  Problem Relation Age of Onset  . Heart disease Father   . Heart attack Father   . Heart disease Paternal Grandmother   . Hypercalcemia Neg Hx     Social History   Tobacco Use  . Smoking status: Never Smoker  . Smokeless tobacco: Never Used  Vaping Use  . Vaping Use: Never used  Substance Use Topics  . Alcohol use: Yes    Alcohol/week: 0.0 standard drinks    Comment: seldom   . Drug use: No    Home Medications Prior to Admission medications   Medication Sig Start Date End Date Taking? Authorizing Provider  doxycycline (VIBRAMYCIN) 100 MG capsule Take 1 capsule (100 mg total) by mouth 2 (two) times daily for 7 days. 11/16/20 11/23/20 Yes Kadence Mimbs, Stevphen Meuse S, PA  acetaminophen (TYLENOL) 650 MG CR tablet Take 650 mg by mouth 5 (five) times daily.     [provider]  aspirin 81 MG tablet Take 81 mg by mouth every evening.     [provider]  bimatoprost (LUMIGAN) 0.01 % SOLN Place 1 drop into both eyes every evening.    [provider]  brimonidine-timolol (COMBIGAN) 0.2-0.5 % ophthalmic solution Place 1 drop into both eyes 2 (two) times daily.    [provider]  brinzolamide (AZOPT) 1 % ophthalmic suspension Place 1 drop into both eyes 2 (two) times daily.     [provider]  empagliflozin (JARDIANCE) 25 MG TABS tablet Take 25 mg by mouth daily. 07/31/19   Noni Saupe, MD  FREESTYLE LITE test strip USE TO TEST BLOOD SUGAR ONCE DAILY 07/21/19   Doristine Bosworth, MD  Lancets (FREESTYLE) lancets Use to test blood sugar once daily 07/26/19   Lezlie Lye, Meda Coffee, MD  losartan (COZAAR) 100 MG tablet Take 1 tablet (100 mg total) by mouth daily. 07/23/20   Noni Saupe, MD  Magnesium 125 MG CAPS Take by mouth. Every 2-3 days    [provider]  metoprolol tartrate (LOPRESSOR) 25 MG tablet TAKE 1 TABLET BY MOUTH TWICE DAILY 04/11/20   Yates Decamp, MD  Milk Thistle 250 MG CAPS Take by mouth. Patient not  taking: Reported on 09/10/2020    [provider]  Multiple Vitamins-Minerals (PRESERVISION AREDS 2) CAPS Take 1 capsule by mouth 2 (two) times daily.    [provider]  nitroGLYCERIN (NITROSTAT) 0.4 MG SL tablet Place 1 tablet (0.4 mg total) under the tongue every 5 (five) minutes as needed for up to 25 days for chest pain. 10/24/20 11/18/20  Yates Decamp, MD  Omega-3 Fatty Acids (FISH OIL) 1000 MG CAPS Take by mouth.    [provider]  OVER THE COUNTER MEDICATION Take 1,000 mg by mouth daily. Microlactin 1000 mg Supplement    [provider]  OVER THE COUNTER MEDICATION  Take 2-3 drops by mouth See admin instructions. CBD Hemp Oil - Place 3 drops in mouth in the morning, place 2 drops in mouth at lunch and dinner, and place 3 drops in mouth at bedtime.    [provider]  rosuvastatin (CRESTOR) 20 MG tablet Take 1 tablet (20 mg total) by mouth daily. 04/09/20   Jacelyn Pi, Lilia Argue, MD  sitaGLIPtin (JANUVIA) 100 MG tablet Take 1 tablet (100 mg total) by mouth daily. 07/31/19   Daleen Squibb, MD  spironolactone (ALDACTONE) 25 MG tablet Take 1 tablet (25 mg total) by mouth daily. 08/13/20   Jacelyn Pi, Lilia Argue, MD  SUMAtriptan (IMITREX) 50 MG tablet TAKE 1 TABLET BY MOUTH EVERY 2 HOURS AS NEEDED FOR MIGRAINE 08/12/20   Jacelyn Pi, Lilia Argue, MD    Allergies    Amaryl, Bisacodyl, Other, Skelaxin [metaxalone], Stool softener [dss], Norco [hydrocodone-acetaminophen], Pistachio nut (diagnostic), Levofloxacin, Lisinopril, and Metformin and related  Review of Systems   Review of Systems  Constitutional: Negative for chills and fever.  HENT: Negative for congestion.   Respiratory: Negative for shortness of breath.   Cardiovascular: Negative for chest pain.  Gastrointestinal: Negative for abdominal pain.  Musculoskeletal: Negative for neck pain.  Skin:       "Lump" warm to touch    Physical Exam Updated Vital Signs BP (!) 159/98 (BP Location: Left  Arm)   Pulse 60   Temp 97.7 F (36.5 C) (Oral)   Resp 18   SpO2 99%   Physical Exam Vitals and nursing note reviewed.  Constitutional:      General: He is not in acute distress.    Appearance: Normal appearance. He is not ill-appearing.  HENT:     Head: Normocephalic and atraumatic.  Eyes:     General: No scleral icterus.       Right eye: No discharge.        Left eye: No discharge.     Conjunctiva/sclera: Conjunctivae normal.  Pulmonary:     Effort: Pulmonary effort is normal.     Breath sounds: No stridor.  Skin:    General: Skin is warm and dry.     Comments: 1 cm in diameter lump to the anterior forehead that is warm to touch and red.  No fluctuance.  Small pinpoint head visible at center of lump. Erythematous skin. Mild induration.  Neurological:     Mental Status: He is alert and oriented to person, place, and time. Mental status is at baseline.         ED Results / Procedures / Treatments   Labs (all labs ordered are listed, but only abnormal results are displayed) Labs Reviewed - No data to display  EKG None  Radiology No results found.  Procedures Procedures (including critical care time)  Medications Ordered in ED Medications - No data to display  ED Course  I have reviewed the triage vital signs and the nursing notes.  Pertinent labs & imaging results that were available during my care of the patient were reviewed by me and considered in my medical decision making (see chart for details).    MDM Rules/Calculators/A&P                          75 year old male with past medical history detailed in HPI presented today for lump on forehead that appears to be infected cyst.  I have low suspicion for fungal infection.  Will provide patient  with doxycycline and have follow-up with primary care doctor Monday or Tuesday.  Given return precautions.  Patient assessed by my doing physician at bedside prior to my discharge patient.  Patient is  understanding of plan has no further questions at this time.  Has no other symptoms.  Vital signs within normal is apart from blood pressure which is mildly elevated patient has not been in the ER for 13 hours has not taken his blood pressure medications today.  Final Clinical Impression(s) / ED Diagnoses Final diagnoses:  Skin infection    Rx / DC Orders ED Discharge Orders         Ordered    doxycycline (VIBRAMYCIN) 100 MG capsule  2 times daily        11/16/20 0814           Tedd Sias, PA 11/16/20 1118    Pattricia Boss, MD 11/16/20 1531

## 2020-11-27 ENCOUNTER — Encounter: Payer: Self-pay | Admitting: Endocrinology

## 2020-11-27 ENCOUNTER — Encounter: Payer: Self-pay | Admitting: Plastic Surgery

## 2020-11-27 ENCOUNTER — Other Ambulatory Visit: Payer: Self-pay

## 2020-11-27 ENCOUNTER — Ambulatory Visit (INDEPENDENT_AMBULATORY_CARE_PROVIDER_SITE_OTHER): Payer: Medicare Other | Admitting: Plastic Surgery

## 2020-11-27 ENCOUNTER — Ambulatory Visit (INDEPENDENT_AMBULATORY_CARE_PROVIDER_SITE_OTHER): Payer: Medicare Other | Admitting: Endocrinology

## 2020-11-27 VITALS — Temp 97.3°F

## 2020-11-27 DIAGNOSIS — L989 Disorder of the skin and subcutaneous tissue, unspecified: Secondary | ICD-10-CM

## 2020-11-27 NOTE — Patient Instructions (Addendum)
Please continue the same medications. Please come back for a follow-up appointment in 4 months. 

## 2020-11-27 NOTE — Progress Notes (Signed)
Subjective:    Patient ID: [name], male    DOB: Feb 07, 1946, 75 y.o.   MRN: [id number]  HPI Pt returns for f/u of hypercalcemia and hyperparathyroidism (dx'ed 2011; he has never had osteoporosis; only bony fracture was right hand; he has h/o urolithiasis; PTH is borderline elev, with Vit-D supplement; other w/u was neg; 24HR urine Ca++ was 596 mg; he takes vit-D, at an uncertain dosage; aldactone was started after chlorthalidone was stopped).  pt states he feels well in general.  He takes meds as rx'ed. Past Medical History:  Diagnosis Date  . Arthritis    DDD, scoliosis, stenosis    . 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 Calcium blood increased   . Cancer (HCC)    skin ca, posterior R ear   (BENIGN)  . Complication of anesthesia    difficulty intubation , anterior larynx, easy mask ventilation  . Coronary artery disease   . Diabetes mellitus without complication ([place])   . Difficult intubation   . Diverticulitis   . Glaucoma   . Headache(784.0)    migraines  . History of kidney stones   . Hyperlipemia   . Hypertension   . 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 Sleep apnea    CPAP still in use q night, & for naps thru out the day, pt. reports that last study was neg for sleep apnea but he still uses the device    . Spinal stenosis   . 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 Ventricular dilatation    Right    Past Surgical History:  Procedure Laterality Date  . APPENDECTOMY  1991  . BOWEL RESECTION  1985  . CARDIAC CATHETERIZATION  2006  . CATARACT EXTRACTION Right   . CHOLECYSTECTOMY  1990  . COLON SURGERY     bowel resection, for diverticulitis   . COLONOSCOPY N/A 06/15/2013   Procedure: COLONOSCOPY;  Surgeon: Beryle Beams, MD;  Location: [facility];  Service: Endoscopy;  Laterality: N/A;  .  COLONOSCOPY WITH PROPOFOL N/A 07/01/2018   Procedure: COLONOSCOPY WITH PROPOFOL;  Surgeon: Carol Ada, MD;  Location: [facility];  Service: Endoscopy;  Laterality: N/A;  . CORONARY ANGIOPLASTY     STENT PLACED  . LAMINECTOMY WITH POSTERIOR LATERAL ARTHRODESIS LEVEL 1 N/A 04/09/2017   Procedure: Lumbar TwoThree,Lumbar Three-Four,Lumbar Four-Five Laminectomy with Lumbar Five-Sacral One Posterior lateral fusion;  Surgeon: Eustace Moore, MD;  Location: [facility];  Service: Neurosurgery;  Laterality: N/A;  . LITHOTRIPSY    . POLYPECTOMY  07/01/2018   Procedure: POLYPECTOMY;  Surgeon: Carol Ada, MD;  Location: [facility];  Service: Endoscopy;;  . SPINAL CORD STIMULATOR INSERTION N/A 03/20/2016   Procedure: LUMBAR SPINAL CORD STIMULATOR INSERTION;  Surgeon: Clydell Hakim, MD;  Location: [facility];  Service: Neurosurgery;  Laterality: N/A;  . TONSILLECTOMY    . [place] ?    Social History   Socioeconomic History  . Marital status: Married    Spouse name: Not on file  . Number of children: 0  . Years of education: Masters +  . Highest education level: Not on file  Occupational History  . Occupation: Retired  Tobacco Use  . Smoking status: Never Smoker  . Smokeless tobacco: Never Used  Vaping Use  . Vaping Use: Never  used  Substance and Sexual Activity  . Alcohol use: Yes    Alcohol/week: 0.0 standard drinks    Comment: seldom   . Drug use: No  . Sexual activity: Yes  Other Topics Concern  . Not on file  Social History Narrative   Marital status: married x 1969      Children: none      Lives: with wife, 1 dog      Employment: retired at age 30.  Education music      Tobacco: none      Alcohol: rare      Exercise: stationary bike in 2018; rare use in 2018 due to sick dog.      ADLs: independent with ADLs; drives      Advanced Directives:    2 cups of coffee a day, soda or tea about 3 times a week.    Social Determinants of Health   Financial  Resource Strain: Not on file  Food Insecurity: Not on file  Transportation Needs: Not on file  Physical Activity: Not on file  Stress: Not on file  Social Connections: Not on file  Intimate Partner Violence: Not on file    Current Outpatient Medications on File Prior to Visit  Medication Sig Dispense Refill  . acetaminophen (TYLENOL) 650 MG CR tablet Take 650 mg by mouth 5 (five) times daily.     Marland Kitchen aspirin 81 MG tablet Take 81 mg by mouth every evening.     . bimatoprost (LUMIGAN) 0.01 % SOLN Place 1 drop into both eyes every evening.    . brimonidine-timolol (COMBIGAN) 0.2-0.5 % ophthalmic solution Place 1 drop into both eyes 2 (two) times daily.    . brinzolamide (AZOPT) 1 % ophthalmic suspension Place 1 drop into both eyes 2 (two) times daily.     . empagliflozin (JARDIANCE) 25 MG TABS tablet Take 25 mg by mouth daily. 90 tablet 3  . FREESTYLE LITE test strip USE TO TEST BLOOD SUGAR ONCE DAILY 100 strip 3  . Lancets (FREESTYLE) lancets Use to test blood sugar once daily 100 each 12  . losartan (COZAAR) 100 MG tablet Take 1 tablet (100 mg total) by mouth daily. 30 tablet 3  . metoprolol tartrate (LOPRESSOR) 25 MG tablet TAKE 1 TABLET BY MOUTH TWICE DAILY 180 tablet 3  . Milk Thistle 250 MG CAPS Take by mouth.    . Multiple Vitamins-Minerals (PRESERVISION AREDS 2) CAPS Take 1 capsule by mouth 2 (two) times daily.    . Omega-3 Fatty Acids (FISH OIL) 1000 MG CAPS Take by mouth.    Marland Kitchen OVER THE COUNTER MEDICATION Take 1,000 mg by mouth daily. Microlactin 1000 mg Supplement    . OVER THE COUNTER MEDICATION Take 2-3 drops by mouth See admin instructions. CBD Hemp Oil - Place 3 drops in mouth in the morning, place 2 drops in mouth at lunch and dinner, and place 3 drops in mouth at bedtime.    . rosuvastatin (CRESTOR) 20 MG tablet Take 1 tablet (20 mg total) by mouth daily. 90 tablet 3  . sitaGLIPtin (JANUVIA) 100 MG tablet Take 1 tablet (100 mg total) by mouth daily. 90 tablet 3  .  spironolactone (ALDACTONE) 25 MG tablet Take 1 tablet (25 mg total) by mouth daily. 30 tablet 3  . SUMAtriptan (IMITREX) 50 MG tablet TAKE 1 TABLET BY MOUTH EVERY 2 HOURS AS NEEDED FOR MIGRAINE 9 tablet 3  . nitroGLYCERIN (NITROSTAT) 0.4 MG SL tablet Place 1 tablet (0.4 mg total)  under the tongue every 5 (five) minutes as needed for up to 25 days for chest pain. 25 tablet 0   No current facility-administered medications on file prior to visit.    Allergies  Allergen Reactions  . Amaryl Other (See Comments)    Gives pt migraines  . Bisacodyl     Causes headaches  . Other Other (See Comments)    Pistachios, lima beans, and some types of chocolate cause migraines  Nasal corticosteroids causes nose bleeds  . Skelaxin [Metaxalone] Dermatitis and Other (See Comments)    Cherry red rash with sloughing off of skin at left upper chest/shoulder  . Stool Softener [Dss] Other (See Comments)    Causes migraines  . Norco [Hydrocodone-Acetaminophen] Other (See Comments)    Ineffective  . Pistachio Nut (Diagnostic) Other (See Comments)     In  Addition :Lima beans, milk chocolate   . Levofloxacin Nausea Only  . Lisinopril Cough  . Metformin And Related Diarrhea    Family History  Problem Relation Age of Onset  . Heart disease Father   . Heart attack Father   . Heart disease Paternal Grandmother   . Hypercalcemia Neg Hx     BP 126/80   Pulse 74   Ht 6\' 1"  (1.854 m)   Wt 200 lb (90.7 kg)   SpO2 96%   BMI 26.39 kg/m    Review of Systems Denies sob.      Objective:   Physical Exam VITAL SIGNS:  See vs page GENERAL: no distress Ext: 1+ bilat leg edema.     outside test results are reviewed:  Ca++=11.3 Mg++=2.4    Assessment & Plan:  Hypercalcemia, persistentprob due to mild hyperparathyroidism.  I advised him to add lasix at low dosage, but he declines.   Hypermagnesemia: I advised pt to d/c Mg++ supplement.  He agrees.   Patient Instructions  Please continue the same  medications.  Please come back for a follow-up appointment in 4 months.

## 2020-11-27 NOTE — Progress Notes (Signed)
Referring Provider [name], MD No address on file   CC: No chief complaint on file.     [name] is an 75 y.o. male.  HPI: Patient presents to discuss a cystic lesion of the central forehead.  He has some remote trauma to that area and a vertical scar from injury as a young child and has had a cystic lesion in his forehead for around 30 years he claims.  For some reason over the past couple weeks it became inflamed and grew and he tried to drain it on his own but subsequently the inflammation and swelling got worse.  He was seen in the emergency room and prescribed antibiotics and then the antibiotic prescription was extended by his primary care physician.  He reports that there is no longer any drainage in the area seems to be coming down in size but wants to know what he should do going forward.  Allergies  Allergen Reactions  . Amaryl Other (See Comments)    Gives pt migraines  . Bisacodyl     Causes headaches  . Other Other (See Comments)    Pistachios, lima beans, and some types of chocolate cause migraines  Nasal corticosteroids causes nose bleeds  . Skelaxin [Metaxalone] Dermatitis and Other (See Comments)    Cherry red rash with sloughing off of skin at left upper chest/shoulder  . Stool Softener [Dss] Other (See Comments)    Causes migraines  . Norco [Hydrocodone-Acetaminophen] Other (See Comments)    Ineffective  . Pistachio Nut (Diagnostic) Other (See Comments)     In  Addition :Lima beans, milk chocolate   . Levofloxacin Nausea Only  . Lisinopril Cough  . Metformin And Related Diarrhea    Outpatient Encounter Medications as of 11/27/2020  Medication Sig Note  . acetaminophen (TYLENOL) 650 MG CR tablet Take 650 mg by mouth 5 (five) times daily.  09/10/2020: Take 1 500 mg tid and 1 650 mg tid  . aspirin 81 MG tablet Take 81 mg by mouth every evening.  06/24/2018: Stopped for procedure 08/08  . bimatoprost (LUMIGAN) 0.01 % SOLN Place 1 drop into both  eyes every evening.   . brimonidine-timolol (COMBIGAN) 0.2-0.5 % ophthalmic solution Place 1 drop into both eyes 2 (two) times daily.   . brinzolamide (AZOPT) 1 % ophthalmic suspension Place 1 drop into both eyes 2 (two) times daily.    . empagliflozin (JARDIANCE) 25 MG TABS tablet Take 25 mg by mouth daily.   Marland Kitchen FREESTYLE LITE test strip USE TO TEST BLOOD SUGAR ONCE DAILY 09/10/2020: use  . Lancets (FREESTYLE) lancets Use to test blood sugar once daily 09/10/2020: use  . losartan (COZAAR) 100 MG tablet Take 1 tablet (100 mg total) by mouth daily. 09/10/2020: Take 50 mg bid  . metoprolol tartrate (LOPRESSOR) 25 MG tablet TAKE 1 TABLET BY MOUTH TWICE DAILY   . Milk Thistle 250 MG CAPS Take by mouth.   . Multiple Vitamins-Minerals (PRESERVISION AREDS 2) CAPS Take 1 capsule by mouth 2 (two) times daily.   . nitroGLYCERIN (NITROSTAT) 0.4 MG SL tablet Place 1 tablet (0.4 mg total) under the tongue every 5 (five) minutes as needed for up to 25 days for chest pain.   . Omega-3 Fatty Acids (FISH OIL) 1000 MG CAPS Take by mouth. 09/10/2020: Take 1200 mg  . OVER THE COUNTER MEDICATION Take 1,000 mg by mouth daily. Microlactin 1000 mg Supplement   . OVER THE COUNTER MEDICATION Take 2-3 drops by mouth  See admin instructions. CBD Hemp Oil - Place 3 drops in mouth in the morning, place 2 drops in mouth at lunch and dinner, and place 3 drops in mouth at bedtime.   . rosuvastatin (CRESTOR) 20 MG tablet Take 1 tablet (20 mg total) by mouth daily.   . sitaGLIPtin (JANUVIA) 100 MG tablet Take 1 tablet (100 mg total) by mouth daily.   Marland Kitchen spironolactone (ALDACTONE) 25 MG tablet Take 1 tablet (25 mg total) by mouth daily.   . SUMAtriptan (IMITREX) 50 MG tablet TAKE 1 TABLET BY MOUTH EVERY 2 HOURS AS NEEDED FOR MIGRAINE    No facility-administered encounter medications on file as of 11/27/2020.     Past Medical History:  Diagnosis Date  . Arthritis    DDD, scoliosis, stenosis    . Atherosclerotic heart disease  native coronary artery w/angina pectoris ([place])   . Calcium blood increased   . Cancer (HCC)    skin ca, posterior R ear   ([place])  . Complication of anesthesia    difficulty intubation , anterior larynx, easy mask ventilation  . Coronary artery disease   . Diabetes mellitus without complication ([place])   . Difficult intubation   . Diverticulitis   . Glaucoma   . Headache(784.0)    migraines  . History of kidney stones   . Hyperlipemia   . Hypertension   . 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 Sleep apnea    CPAP still in use q night, & for naps thru out the day, pt. reports that last study was neg for sleep apnea but he still uses the device    . Spinal stenosis   . 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 Ventricular dilatation    Right    Past Surgical History:  Procedure Laterality Date  . APPENDECTOMY  1991  . BOWEL RESECTION  1985  . CARDIAC CATHETERIZATION  2006  . CATARACT EXTRACTION Right   . CHOLECYSTECTOMY  1990  . COLON SURGERY     bowel resection, for diverticulitis   . COLONOSCOPY N/A 06/15/2013   Procedure: COLONOSCOPY;  Surgeon: Beryle Beams, MD;  Location: [facility];  Service: Endoscopy;  Laterality: N/A;  . COLONOSCOPY WITH PROPOFOL N/A 07/01/2018   Procedure: COLONOSCOPY WITH PROPOFOL;  Surgeon: Carol Ada, MD;  Location: [facility];  Service: Endoscopy;  Laterality: N/A;  . CORONARY ANGIOPLASTY     STENT PLACED  . LAMINECTOMY WITH POSTERIOR LATERAL ARTHRODESIS LEVEL 1 N/A 04/09/2017   Procedure: Lumbar TwoThree,Lumbar Three-Four,Lumbar Four-Five Laminectomy with Lumbar Five-Sacral One Posterior lateral fusion;  Surgeon: Eustace Moore, MD;  Location: [facility];  Service: Neurosurgery;  Laterality: N/A;  . LITHOTRIPSY    . POLYPECTOMY  07/01/2018   Procedure: POLYPECTOMY;  Surgeon: Carol Ada, MD;  Location: [facility];  Service:  Endoscopy;;  . SPINAL CORD STIMULATOR INSERTION N/A 03/20/2016   Procedure: LUMBAR SPINAL CORD STIMULATOR INSERTION;  Surgeon: Clydell Hakim, MD;  Location: [facility];  Service: Neurosurgery;  Laterality: N/A;  . TONSILLECTOMY    . [place] ?    Family History  Problem Relation Age of Onset  . Heart disease Father   . Heart attack Father   . Heart disease Paternal Grandmother   . Hypercalcemia Neg Hx     Social History   Social History Narrative  Marital status: married x 1969      Children: none      Lives: with wife, 1 dog      Employment: retired at age 63.  Education music      Tobacco: none      Alcohol: rare      Exercise: stationary bike in 2018; rare use in 2018 due to sick dog.      ADLs: independent with ADLs; drives      Advanced Directives:    2 cups of coffee a day, soda or tea about 3 times a week.      Review of Systems General: Denies fevers, chills, weight loss CV: Denies chest pain, shortness of breath, palpitations  Physical Exam Vitals with BMI 11/27/2020 11/16/2020 11/16/2020  Height 6\' 1"  - -  Weight 200 lbs - -  BMI 03.49 - -  Systolic 179 150 569  Diastolic 80 98 86  Pulse 74 60 61    General:  No acute distress,  Alert and oriented, Non-Toxic, Normal speech and affect Examination shows a 2 cm area of swelling and erythema in the central forehead area adjacent to a vertical scar.  There is no purulence or fluid beneath the skin that I can detect.  There is no discrete subcutaneous mass that I can obviously palpate at this point time.  Assessment/Plan Patient presents with what sounds like an inflamed subcutaneous cyst that is gradually resolving.  We discussed watching this versus excision.  We discussed the pros and cons of both and at the moment the patient is content with watching this but I think is a reasonable course of action.  If it does recur and become inflamed again I have recommended an antibiotic prescription along  with a period of waiting to allow it to resolve and if it does this again will more than likely plan an excision.  Patient is fully understanding and all of his questions were answered.  Cindra Presume 11/27/2020, 1:57 PM

## 2020-12-09 DIAGNOSIS — H2512 Age-related nuclear cataract, left eye: Secondary | ICD-10-CM | POA: Diagnosis not present

## 2020-12-09 DIAGNOSIS — H401133 Primary open-angle glaucoma, bilateral, severe stage: Secondary | ICD-10-CM | POA: Diagnosis not present

## 2020-12-09 DIAGNOSIS — E119 Type 2 diabetes mellitus without complications: Secondary | ICD-10-CM | POA: Diagnosis not present

## 2020-12-09 DIAGNOSIS — Z961 Presence of intraocular lens: Secondary | ICD-10-CM | POA: Diagnosis not present

## 2020-12-11 ENCOUNTER — Other Ambulatory Visit: Payer: Self-pay

## 2020-12-11 ENCOUNTER — Ambulatory Visit (INDEPENDENT_AMBULATORY_CARE_PROVIDER_SITE_OTHER): Payer: Medicare Other | Admitting: Physician Assistant

## 2020-12-11 ENCOUNTER — Encounter: Payer: Self-pay | Admitting: Physician Assistant

## 2020-12-11 DIAGNOSIS — D485 Neoplasm of uncertain behavior of skin: Secondary | ICD-10-CM

## 2020-12-11 DIAGNOSIS — Z85828 Personal history of other malignant neoplasm of skin: Secondary | ICD-10-CM

## 2020-12-11 DIAGNOSIS — L570 Actinic keratosis: Secondary | ICD-10-CM | POA: Diagnosis not present

## 2020-12-11 DIAGNOSIS — Z86007 Personal history of in-situ neoplasm of skin: Secondary | ICD-10-CM | POA: Diagnosis not present

## 2020-12-11 DIAGNOSIS — Z8589 Personal history of malignant neoplasm of other organs and systems: Secondary | ICD-10-CM

## 2020-12-11 DIAGNOSIS — L988 Other specified disorders of the skin and subcutaneous tissue: Secondary | ICD-10-CM | POA: Diagnosis not present

## 2020-12-11 NOTE — Patient Instructions (Signed)

## 2020-12-11 NOTE — Progress Notes (Signed)
   Follow-Up Visit   Subjective  [name] is a 75 y.o. male who presents for the following: Follow-up (Follw up - left ear & left buccal cheek- healing good).   The following portions of the chart were reviewed this encounter and updated as appropriate:      Objective  Well appearing patient in no apparent distress; mood and affect are within normal limits.  All skin waist up examined.  Objective  Left Ear: Dyspigmented scar.   Objective  Left Buccal Cheek: Scar with superficial pink scale  Objective  Left Buccal Cheek: Slight pink scale on surface of scar 43mm punch [id number] 2 sutures  Objective  Mid Back: Erythematous patches with gritty scale.   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  observe  History of squamous cell carcinoma Left Buccal Cheek  Yearly skin check  Neoplasm of uncertain behavior of skin Left Buccal Cheek  Skin / nail biopsy Type of biopsy: punch   Informed consent: discussed and consent obtained   Procedure prep:  Patient was prepped and draped in usual sterile fashion (nonsterile) Prep type:  Chlorhexidine Anesthesia: the lesion was anesthetized in a standard fashion   Anesthetic:  1% lidocaine w/ epinephrine 1-100,000 local infiltration Punch size:  4 mm Suture size:  4-0 Suture type: nylon   Suture removal (days):  7 Hemostasis achieved with: suture   Outcome: patient tolerated procedure well   Post-procedure details: sterile dressing applied and wound care instructions given   Post-procedure details comment:  Nonsterile Dressing type: bandage and petrolatum    Specimen 1 - Surgical pathology Differential Diagnosis: R/O SCC  Check Margins: No  20mm Punch  [id number]  AK (actinic keratosis) Mid Back  Destruction of lesion - Mid Back Complexity: simple   Destruction method: cryotherapy   Informed consent: discussed and consent obtained   Timeout:  patient  name, date of birth, surgical site, and procedure verified Lesion destroyed using liquid nitrogen: Yes   Cryotherapy cycles:  1 Outcome: patient tolerated procedure well with no complications   Post-procedure details: wound care instructions given      I, Chipper Koudelka, PA-C, have reviewed all documentation's for this visit.  The documentation on 12/11/20 for the exam, diagnosis, procedures and orders are all accurate and complete.

## 2020-12-12 ENCOUNTER — Ambulatory Visit: Payer: Medicare Other | Admitting: Physician Assistant

## 2020-12-17 ENCOUNTER — Encounter: Payer: Self-pay | Admitting: Cardiology

## 2020-12-18 ENCOUNTER — Ambulatory Visit (INDEPENDENT_AMBULATORY_CARE_PROVIDER_SITE_OTHER): Payer: Medicare Other

## 2020-12-18 ENCOUNTER — Other Ambulatory Visit: Payer: Self-pay

## 2020-12-18 DIAGNOSIS — Z4802 Encounter for removal of sutures: Secondary | ICD-10-CM

## 2020-12-18 NOTE — Progress Notes (Signed)
Path to patient suture removed no signs or symptoms of infection

## 2020-12-20 ENCOUNTER — Telehealth: Payer: Self-pay

## 2020-12-20 NOTE — Telephone Encounter (Signed)
Phone call to patient with his pathology results. Patient aware.  

## 2020-12-20 NOTE — Telephone Encounter (Signed)
-----   Message from Warren Danes, [place] sent at 12/19/2020  4:10 PM EST ----- No BCC. Clear. RTC if recurs

## 2021-01-09 ENCOUNTER — Ambulatory Visit: Payer: Medicare Other | Admitting: Student

## 2021-01-09 ENCOUNTER — Ambulatory Visit: Payer: Medicare Other | Admitting: Plastic Surgery

## 2021-01-09 ENCOUNTER — Other Ambulatory Visit: Payer: Self-pay

## 2021-01-09 ENCOUNTER — Encounter: Payer: Self-pay | Admitting: Student

## 2021-01-09 VITALS — BP 144/77 | HR 63 | Temp 98.1°F | Ht 73.0 in | Wt 198.0 lb

## 2021-01-09 DIAGNOSIS — I25118 Atherosclerotic heart disease of native coronary artery with other forms of angina pectoris: Secondary | ICD-10-CM

## 2021-01-09 MED ORDER — HYDRALAZINE HCL 25 MG PO TABS: 25.0000 mg | ORAL_TABLET | Freq: Three times a day (TID) | ORAL | 3 refills | Status: DC

## 2021-01-09 NOTE — Progress Notes (Signed)
Primary Physician/Referring:  [name], MD  Patient ID: [name], male    DOB: 12-08-1945, 75 y.o.   MRN: [id number]  Chief Complaint  Patient presents with  . Coronary Artery Disease  . Follow-up   HPI:    [name]  is a 75 y.o. Caucasian male patient with coronary artery disease and has undergone PCI to the RCA on 07/23/2005 with implantation of a 2.5 x 18 Cypher drug-eluting stent.  He has moderate disease in his proximal LAD.  He also has history of hypertension, hyperlipidemia and diabetes mellitus and obstructive sleep apnea on CPAP and compliant and follows Dr. Rexene Alberts.  He has history of spinal stenosis S/P Laminectomy 03/20/2016 and chronic back pain. He now presents for annual visit.  His only complaint today is that he continues to have chronic cough despite stopping lisinopril and switching to losartan, in September 2021 per record review.  He does continue to have occasional episodes of chest pain, which are chronic and stable, lasting <10 seconds occurring primarily at rest.  These episodes occur 5-6 times per week.  He does not take sublingual nitroglycerin as episodes are very brief.  Patient is relatively inactive with the exception of caring for his wife.  Patient notes that his wife has recently been diagnosed with multiple myeloma, which is increasing stress in his life.  He has no specific complaints today, continues to have back pain.  Also admits to having gained weight and reduced activity.  He still has exertional angina but has not used sublingual nitroglycerin in the recent past.  Past Medical History:  Diagnosis Date  . Arthritis    DDD, scoliosis, stenosis    . Atherosclerotic heart disease native coronary artery w/angina pectoris ([place])   . Calcium blood increased   . Cancer (HCC)    skin ca, posterior R ear   ([place])  . Complication of anesthesia    difficulty intubation , anterior larynx, easy mask ventilation  . Coronary artery  disease   . Diabetes mellitus without complication ([place])   . Difficult intubation   . Diverticulitis   . Glaucoma   . Headache(784.0)    migraines  . History of kidney stones   . Hyperlipemia   . Hypertension   . 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 Sleep apnea    CPAP still in use q night, & for naps thru out the day, pt. reports that last study was neg for sleep apnea but he still uses the device    . Spinal stenosis   . 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 Ventricular dilatation    Right   Past Surgical History:  Procedure Laterality Date  . APPENDECTOMY  1991  . BOWEL RESECTION  1985  . CARDIAC CATHETERIZATION  2006  . CATARACT EXTRACTION Right   . CHOLECYSTECTOMY  1990  . COLON SURGERY     bowel resection, for diverticulitis   . COLONOSCOPY N/A 06/15/2013   Procedure: COLONOSCOPY;  Surgeon: Beryle Beams, MD;  Location: [facility];  Service: Endoscopy;  Laterality: N/A;  . COLONOSCOPY WITH PROPOFOL N/A 07/01/2018   Procedure: COLONOSCOPY WITH PROPOFOL;  Surgeon: Carol Ada, MD;  Location: [facility];  Service: Endoscopy;  Laterality: N/A;  . CORONARY ANGIOPLASTY     STENT PLACED  . LAMINECTOMY WITH POSTERIOR LATERAL ARTHRODESIS LEVEL 1 N/A  04/09/2017   Procedure: Lumbar TwoThree,Lumbar Three-Four,Lumbar Four-Five Laminectomy with Lumbar Five-Sacral One Posterior lateral fusion;  Surgeon: Eustace Moore, MD;  Location: [facility];  Service: Neurosurgery;  Laterality: N/A;  . LITHOTRIPSY    . POLYPECTOMY  07/01/2018   Procedure: POLYPECTOMY;  Surgeon: Carol Ada, MD;  Location: [facility];  Service: Endoscopy;;  . SPINAL CORD STIMULATOR INSERTION N/A 03/20/2016   Procedure: LUMBAR SPINAL CORD STIMULATOR INSERTION;  Surgeon: Clydell Hakim, MD;  Location: [facility];  Service: Neurosurgery;  Laterality: N/A;  . TONSILLECTOMY    . [place] ?   Family History  Problem Relation Age of Onset  . Heart disease Father   . Heart attack Father   . Heart disease Paternal Grandmother   . Hypercalcemia Neg Hx     Social History   Tobacco Use  . Smoking status: Never Smoker  . Smokeless tobacco: Never Used  Substance Use Topics  . Alcohol use: Yes    Alcohol/week: 0.0 standard drinks    Comment: seldom    ROS  Review of Systems  Constitutional: Negative for malaise/fatigue and weight gain.  Cardiovascular: Negative for chest pain, claudication, dyspnea on exertion, leg swelling, near-syncope, orthopnea, palpitations, paroxysmal nocturnal dyspnea and syncope.  Respiratory: Positive for cough. Negative for shortness of breath.   Hematologic/Lymphatic: Does not bruise/bleed easily.  Musculoskeletal: Positive for arthritis (gout) and back pain (chronic).  Gastrointestinal: Positive for heartburn. Negative for melena.  Neurological: Negative for dizziness and weakness.   Objective  Blood pressure (!) 144/77, pulse 63, temperature 98.1 F (36.7 C), height _0  (1.854 m), weight 198 lb (89.8 kg), SpO2 97 %.  Vitals with BMI 01/09/2021 11/27/2020 11/16/2020  Height _1  _2  -  Weight 198 lbs 200 lbs -  BMI 63.01 60.10 -  Systolic 932 355 732  Diastolic 77 80 98  Pulse 63 74 60     Physical Exam Vitals reviewed.  HENT:     Head: Normocephalic and atraumatic.  Cardiovascular:     Rate and Rhythm: Normal rate and regular rhythm.     Pulses: Intact distal pulses.          Posterior tibial pulses are 1+ on the right side.     Heart sounds: Normal heart sounds, S1 normal and S2 normal. No murmur heard. No gallop.      Comments: No leg edema, no JVD. Except right DP, all pulses 2 plus and normal.  Pulmonary:     Effort: Pulmonary effort is normal. No respiratory distress.     Breath sounds: Normal breath sounds. No wheezing, rhonchi or rales.  Abdominal:     General: Bowel sounds are normal.     Palpations:  Abdomen is soft.  Musculoskeletal:     Right lower leg: No edema.     Left lower leg: No edema.  Skin:    General: Skin is warm and dry.     Findings: No erythema or lesion.  Neurological:     General: No focal deficit present.     Mental Status: He is alert and oriented to person, place, and time.  Psychiatric:        Mood and Affect: Mood normal.        Behavior: Behavior normal.    Laboratory examination:   Recent Labs    04/08/20 0902 04/09/20 1222 07/09/20 1114 08/19/20 1036 08/27/20 1139  NA 143  --   --  143  --  K 3.5  --   --  4.2  --   CL 100  --   --  105  --   CO2 24  --   --  24  --   GLUCOSE 143*  --   --  120*  --   BUN 20  --   --  18  --   CREATININE 0.89  --   --  0.85  --   CALCIUM 10.7*   < > 11.1* 10.7* 11.2*  GFRNONAA 84  --   --  86  --   GFRAA 97  --   --  99  --    < > = values in this interval not displayed.   CrCl cannot be calculated (Patient's most recent lab result is older than the maximum 21 days allowed.).  CMP Latest Ref Rng & Units 08/27/2020 08/19/2020 07/09/2020  Glucose 65 - 99 mg/dL - 120(H) -  BUN 8 - 27 mg/dL - 18 -  Creatinine 0.76 - 1.27 mg/dL - 0.85 -  Sodium 134 - 144 mmol/L - 143 -  Potassium 3.5 - 5.2 mmol/L - 4.2 -  Chloride 96 - 106 mmol/L - 105 -  CO2 20 - 29 mmol/L - 24 -  Calcium 8.6 - 10.3 mg/dL 11.2(H) 10.7(H) 11.1(H)  Total Protein 6.1 - 8.1 g/dL 7.0 - -  Total Bilirubin 0.0 - 1.2 mg/dL - - -  Alkaline Phos 48 - 121 IU/L - - -  AST 0 - 40 IU/L - - -  ALT 0 - 44 IU/L - - -   CBC Latest Ref Rng & Units 01/03/2019 08/23/2018 02/07/2018  WBC 3.4 - 10.8 x10E3/uL 5.8 6.1 6.4  Hemoglobin 13.0 - 17.7 g/dL 16.2 16.1 16.1  Hematocrit 37.5 - 51.0 % 47.8 46.7 46.4  Platelets 150 - 450 x10E3/uL 194 164 189   Lipid Panel     Component Value Date/Time   CHOL 162 04/08/2020 0902   TRIG 223 (H) 04/08/2020 0902   HDL 36 (L) 04/08/2020 0902   CHOLHDL 4.5 04/08/2020 0902   CHOLHDL 3.8 06/03/2016 1201   VLDL 27 06/03/2016  1201   LDLCALC 88 04/08/2020 0902   LDLDIRECT 68.5 09/27/2008 1008   HEMOGLOBIN A1C Lab Results  Component Value Date   HGBA1C 6.1 (H) 08/19/2020   MPG 134 04/01/2017   TSH No results for input(s): TSH in the last 8760 hours.  External Labs:  11/14/2020: Total cholesterol 146, triglycerides 246, HDL 36, LDL 70 A1c 6.2% Glucose 111, BUN 20, creatinine 0.93, GFR 81, sodium 143, potassium 4.4, AST 16, ALT 60  Medications and allergies   Allergies  Allergen Reactions  . Amaryl Other (See Comments)    Gives pt migraines  . Bisacodyl     Causes headaches  . Other Other (See Comments)    Pistachios, lima beans, and some types of chocolate cause migraines  Nasal corticosteroids causes nose bleeds  . Skelaxin [Metaxalone] Dermatitis and Other (See Comments)    Cherry red rash with sloughing off of skin at left upper chest/shoulder  . Stool Softener [Dss] Other (See Comments)    Causes migraines  . Amlodipine Besylate Swelling  . Norco [Hydrocodone-Acetaminophen] Other (See Comments)    Ineffective  . Pistachio Nut (Diagnostic) Other (See Comments)     In  Addition :Lima beans, milk chocolate   . Levofloxacin Nausea Only  . Lisinopril Cough  . Metformin And Related Diarrhea  Current Outpatient Medications  Medication Instructions  . acetaminophen (TYLENOL) 650 mg, Oral, 5 times daily  . aspirin 81 mg, Oral, Every evening  . bimatoprost (LUMIGAN) 0.01 % SOLN 1 drop, Both Eyes, Every evening  .  brimonidine-timolol (COMBIGAN) 0.2-0.5 % ophthalmic solution 1 drop, 2 times daily  . brinzolamide (AZOPT) 1 % ophthalmic suspension 1 drop, Both Eyes, 2 times daily  . cloNIDine (CATAPRES) 0.1 MG tablet 1 tablet, Oral, 2 times daily  . empagliflozin (JARDIANCE) 25 mg, Oral, Daily  . FREESTYLE LITE test strip USE TO TEST BLOOD SUGAR ONCE DAILY  . hydrALAZINE (APRESOLINE) 25 mg, Oral, 3 times daily  . Lancets (FREESTYLE) lancets Use to test blood sugar once daily  . metoprolol tartrate (LOPRESSOR) 25 MG tablet TAKE 1 TABLET BY MOUTH TWICE DAILY  . Multiple Vitamins-Minerals (PRESERVISION AREDS 2) CAPS 1 capsule, Oral, 2 times daily  . nitroGLYCERIN (NITROSTAT) 0.4 mg, Sublingual, Every 5 min PRN  . Omega-3 Fatty Acids (FISH OIL) 1000 MG CAPS Oral  . OVER THE COUNTER MEDICATION 1,000 mg, Oral, Daily, Microlactin 1000 mg Supplement  . OVER THE COUNTER MEDICATION 2-3 drops, Oral, See admin instructions, CBD Hemp Oil - Place 3 drops in mouth in the morning, place 2 drops in mouth at lunch and dinner, and place 3 drops in mouth at bedtime.   . rosuvastatin (CRESTOR) 20 mg, Oral, Daily  . sitaGLIPtin (JANUVIA) 100 mg, Oral, Daily  . spironolactone (ALDACTONE) 25 mg, Oral, Daily  . SUMAtriptan (IMITREX) 50 MG tablet TAKE 1 TABLET BY MOUTH EVERY 2 HOURS AS NEEDED FOR MIGRAINE  . TART CHERRY PO Oral   Radiology:   No results found.  Cardiac Studies:   Coronary angiogram 07/23/2005 PTCA/Stent Mid RCA 2.5 x 18 Cypher drug-eluting stent. Moderate disease in proximal LAD.  Exercise Myoview stress test 11/27/2013: 1. Resting EKG showed normal sinus rhythm, poor R wave progression, non-specific ST-depression in inferior leads, cannot R/O ischemia. Stress EKG was equivocal for ischemia. There was 1.5 mm additional up-sloping ST depression noted at peak exercise, which resolved at > 2 minutes into recovery. Patient exercised on BRUCE PROTOCOL for 7 minutes 1 seconds. The maximum work level achieved was 7.6  MET's. The test was terminated due to achievement of the target heart rate. 2. Perfusion imaging study demonstrated no evidence of ischemia. The right ventricle was prominent and suggests RV dilatation/hypertrophy. Dynamic gated images reveal normal wall motion and endocardial thickening. Left ventricular ejection fraction was estimated to be 73%. This is a low risk study.  Abdominal Aortic Duplex [08/13/2015]: Diffuse plaque noted in the proximal aorta. Focal plaque noted in the mid and distal aorta. mild ectasia noted in the abdominal aorta. No AAA observed. No significant change from 01/24/15, previously noted AAA is probably ectasia.  Echocardiogram 01/11/2018: Left ventricle cavity is normal in size. Mild concentric hypertrophy of the left ventricle.  Normal global wall motion. Doppler evidence of grade I (impaired) diastolic dysfunction, normal LAP. Calculated EF 58%. Left atrial cavity is mildly dilated. Mild (Grade I) mitral regurgitation. Mild chordal SAM. Mild to moderate tricuspid regurgitation. Estimated pulmonary artery systolic pressure 29  mmHg. Mild pulmonic regurgitation. Compared to prior study in 2015, RV function has improved, PASP has decreased.   Assessment   EKG 01/09/2021: Sinus bradycardia rate of 52 bpm.  Atrial enlargement.  Normal axis.  PRWP, cannot exclude anteroseptal infarct old.  Nonspecific T wave abnormality.  Compared to EKG 01/10/2020, previously noted ST-T abnormalities in inferior leads not present.  EKG  01/10/2020: Normal sinus rhythm with rate of 64 bpm, leftward enlargement, normal axis.  Poor R wave progression, cannot exclude anteroseptal infarct old.  Nonspecific ST-T abnormality, cannot exclude inferior ischemia.   No significant change from  EKG 01/09/2019 last year   Assessment     ICD-10-CM   1. Atherosclerosis of native coronary artery of native heart with stable angina pectoris (HCC)  I25.118 EKG 12-Lead       Meds ordered this encounter   Medications  . hydrALAZINE (APRESOLINE) 25 MG tablet    Sig: Take 1 tablet (25 mg total) by mouth 3 (three) times daily.    Dispense:  90 tablet    Refill:  3    Medications Discontinued During This Encounter  Medication Reason  . losartan (COZAAR) 100 MG tablet Dose change  . doxycycline (DORYX) 100 MG EC tablet Completed Course  . Milk Thistle 250 MG CAPS Completed Course  . losartan (COZAAR) 50 MG tablet Side effect (s)     Recommendations:   AMR STURTEVANT  is a 75 y.o. Caucasian male patient with coronary artery disease and has undergone PCI to the RCA on 07/23/2005 with implantation of a 2.5 x 18 Cypher drug-eluting stent.  He has moderate disease in his proximal LAD.  He also has history of hypertension, hyperlipidemia and diabetes mellitus and obstructive sleep apnea on CPAP and compliant and follows Dr. Rexene Alberts.  He has history of spinal stenosis S/P Laminectomy 03/20/2016 and chronic back pain.  Patient presents for annual follow-up.  Patient symptoms of angina pectoris have remained stable, he is presently doing well without clinical signs of heart failure.  He continues to have uncontrolled hypertriglyceridemia.  Discussed at length regarding weight loss and importance of diabetes control.  Also discussed benefits of initiating therapy with Vascepa.  Despite knowledge and benefits of starting Vascepa, patient prefers to hold off.    In regard to hypertension, he has had multiple allergies/intolerances to antihypertensive medications including the following: Cough with losartan and lisinopril. Unable to take thiazide and thiazide like diuretics due to parathyroid disorder and hypercalcemia. Developed swelling with amlodipine.   Patient is experiencing cough, suspect may be due to losartan.  Stop losartan and switch patient to hydralazine 25 mg 3 times daily.  Patient will monitor his blood pressure on a daily basis and bring a written log to his next visit.  Follow-up in 8 weeks,  sooner if needed, for hypertension.   Alethia Berthold, PA-C 01/09/2021, 5:54 PM Office: [phone]

## 2021-01-15 ENCOUNTER — Ambulatory Visit: Payer: Medicare Other | Admitting: Plastic Surgery

## 2021-01-17 ENCOUNTER — Telehealth: Payer: Self-pay

## 2021-01-17 NOTE — Telephone Encounter (Signed)
Patient called and left a message stating that you recently changes his BP medication to Hydralazine and the diastolic was increasingly high at 89 this morning and high face turns red, severe headaches, and gets hot, like he is running a fever. He wants to know if should increase Hydralazine from 25mg  3xday to 25mg  4xday or change back to the Losartan. Patient would like a call or a message on MyChart. He cannot add you so he cannot send you a message directly.

## 2021-01-19 NOTE — Telephone Encounter (Signed)
I have messaged patient on MyChart.

## 2021-02-20 DIAGNOSIS — Z23 Encounter for immunization: Secondary | ICD-10-CM | POA: Diagnosis not present

## 2021-03-05 DIAGNOSIS — I10 Essential (primary) hypertension: Secondary | ICD-10-CM | POA: Diagnosis not present

## 2021-03-05 DIAGNOSIS — E785 Hyperlipidemia, unspecified: Secondary | ICD-10-CM | POA: Diagnosis not present

## 2021-03-05 DIAGNOSIS — E119 Type 2 diabetes mellitus without complications: Secondary | ICD-10-CM | POA: Diagnosis not present

## 2021-03-05 DIAGNOSIS — R0982 Postnasal drip: Secondary | ICD-10-CM | POA: Diagnosis not present

## 2021-03-05 DIAGNOSIS — G43909 Migraine, unspecified, not intractable, without status migrainosus: Secondary | ICD-10-CM | POA: Diagnosis not present

## 2021-03-05 DIAGNOSIS — L309 Dermatitis, unspecified: Secondary | ICD-10-CM | POA: Diagnosis not present

## 2021-03-05 NOTE — Progress Notes (Signed)
Primary Physician/Referring:  [name], MD  Patient ID: [name], male    DOB: 09/08/1946, 75 y.o.   MRN: [id number]  Chief Complaint  Patient presents with  . Hypertension  . Follow-up   HPI:    [name]  is a 75 y.o. Caucasian male patient with coronary artery disease and has undergone PCI to the RCA on 07/23/2005 with implantation of a 2.5 x 18 Cypher drug-eluting stent.  He has moderate disease in his proximal LAD.  He also has history of hypertension, hyperlipidemia and diabetes mellitus and obstructive sleep apnea on CPAP and compliant and follows Dr. Rexene Alberts.  He has history of spinal stenosis S/P Laminectomy 03/20/2016 and chronic back pain.  Patient presents for 8-week follow-up of hypertension.  At last visit patient was experiencing cough, suspected secondary to losartan.  Therefore advised patient to discontinue losartan and start hydralazine 25 mg 3 times daily.  Patient was unfortunately unable to tolerate hydralazine due to side effects and therefore discontinue this and resumed previous losartan dose.  Mild intermittent cough has returned, however it is only in the mornings and the evenings and patient uses mouthwash gargle which resolves the cough.  Patient prefers to stay on losartan as his blood pressure is now well controlled.  Patient brings with him a written log of pressure readings which are under excellent control.  Patient denies chest pain, dizziness, syncope, near syncope.  His last visit patient has slowly been increasing his physical activity, however this remains limited due to chronic back pain.  He reports dyspnea on exertion with new increase in physical activity.  Patient's primary concern today is that over the last several months he has been having pain that radiates down the right side of his jaw and up to his right temporal region.  States this occurs 2-3 times per week typically beginning around 3 PM and lasting through the evening.  He  occasionally takes Imitrex, which improves the pain.  He has been evaluated by.  Nondistended dentist who see no identifiable etiology.  He was seen by his primary care provider yesterday and advised to take topiramate 25 mg daily.  Patient states he has a history of migraines as well as dental infections, however this pain is not similar to either.  Patient denies symptoms suggestive of TIA/CVA.  Past Medical History:  Diagnosis Date  . Arthritis    DDD, scoliosis, stenosis    . Atherosclerotic heart disease native coronary artery w/angina pectoris ([place])   . Calcium blood increased   . Cancer (HCC)    skin ca, posterior R ear   (BENIGN)  . Complication of anesthesia    difficulty intubation , anterior larynx, easy mask ventilation  . Coronary artery disease   . Diabetes mellitus without complication ([place])   . Difficult intubation   . Diverticulitis   . Glaucoma   . Headache(784.0)    migraines  . History of kidney stones   . Hyperlipemia   . Hypertension   . 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 Sleep apnea    CPAP still in use q night, & for naps thru out the day, pt. reports that last study was neg for sleep apnea but he still uses the device    . Spinal stenosis   . 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 Ventricular dilatation  Right   Past Surgical History:  Procedure Laterality Date  . APPENDECTOMY  1991  . BOWEL RESECTION  1985  . CARDIAC CATHETERIZATION  2006  . CATARACT EXTRACTION Right   . CHOLECYSTECTOMY  1990  . COLON SURGERY     bowel resection, for diverticulitis   . COLONOSCOPY N/A 06/15/2013   Procedure: COLONOSCOPY;  Surgeon: Beryle Beams, MD;  Location: [facility];  Service: Endoscopy;  Laterality: N/A;  . COLONOSCOPY WITH PROPOFOL N/A 07/01/2018   Procedure: COLONOSCOPY WITH PROPOFOL;  Surgeon: Carol Ada, MD;  Location: [facility];  Service: Endoscopy;  Laterality: N/A;  . CORONARY ANGIOPLASTY     STENT PLACED  . LAMINECTOMY WITH POSTERIOR LATERAL ARTHRODESIS LEVEL 1 N/A 04/09/2017   Procedure: Lumbar TwoThree,Lumbar Three-Four,Lumbar Four-Five Laminectomy with Lumbar Five-Sacral One Posterior lateral fusion;  Surgeon: Eustace Moore, MD;  Location: [facility];  Service: Neurosurgery;  Laterality: N/A;  . LITHOTRIPSY    . POLYPECTOMY  07/01/2018   Procedure: POLYPECTOMY;  Surgeon: Carol Ada, MD;  Location: [facility];  Service: Endoscopy;;  . SPINAL CORD STIMULATOR INSERTION N/A 03/20/2016   Procedure: LUMBAR SPINAL CORD STIMULATOR INSERTION;  Surgeon: Clydell Hakim, MD;  Location: [facility];  Service: Neurosurgery;  Laterality: N/A;  . TONSILLECTOMY    . [place] ?   Family History  Problem Relation Age of Onset  . Heart disease Father   . Heart attack Father   . Heart disease Paternal Grandmother   . Hypercalcemia Neg Hx     Social History   Tobacco Use  . Smoking status: Never Smoker  . Smokeless tobacco: Never Used  Substance Use Topics  . Alcohol use: Yes    Alcohol/week: 0.0 standard drinks    Comment: seldom    ROS  Review of Systems  Constitutional: Negative for malaise/fatigue and weight gain.  Cardiovascular: Positive for dyspnea on exertion. Negative for chest pain, claudication, leg swelling, near-syncope, orthopnea, palpitations, paroxysmal nocturnal dyspnea and syncope.  Respiratory: Positive for cough. Negative for shortness of breath.   Hematologic/Lymphatic: Does not bruise/bleed easily.  Musculoskeletal: Positive for arthritis (gout) and back pain (chronic).  Gastrointestinal: Positive for heartburn. Negative for melena.  Neurological: Negative for dizziness and weakness.   Objective  Blood pressure 119/70, pulse 66, temperature (!) 97.3 F (36.3 C), height _0  (1.854 m), weight 198 lb (89.8 kg), SpO2 98 %.  Vitals with BMI 03/06/2021 01/09/2021  11/27/2020  Height _1  _2  _3   Weight 198 lbs 198 lbs 200 lbs  BMI 26.13 74.12 87.86  Systolic 767 209 470  Diastolic 70 77 80  Pulse 66 63 74     Physical Exam Vitals reviewed.  HENT:     Head: Normocephalic and atraumatic.     Comments: No tenderness to palpation of temporal regions. Neck:     Vascular: Carotid bruit (right) present.  Cardiovascular:     Rate and Rhythm: Normal rate and regular rhythm.     Pulses: Intact distal pulses.     Heart sounds: Normal heart sounds, S1 normal and S2 normal. No murmur heard. No gallop.      Comments: No leg edema, no JVD. Except right DP, all pulses 2 plus and normal.  Pulmonary:     Effort: Pulmonary effort is normal. No respiratory distress.     Breath sounds: Normal breath sounds. No wheezing, rhonchi or rales.  Musculoskeletal:     Right lower leg: No edema.  Left lower leg: No edema.  Skin:    General: Skin is warm and dry.     Findings: No erythema or lesion.  Neurological:     General: No focal deficit present.     Mental Status: He is alert and oriented to person, place, and time.     Cranial Nerves: No cranial nerve deficit.    Laboratory examination:   Recent Labs    04/08/20 0902 04/09/20 1222 07/09/20 1114 08/19/20 1036 08/27/20 1139  NA 143  --   --  143  --   K 3.5  --   --  4.2  --   CL 100  --   --  105  --   CO2 24  --   --  24  --   GLUCOSE 143*  --   --  120*  --   BUN 20  --   --  18  --   CREATININE 0.89  --   --  0.85  --   CALCIUM 10.7*   < > 11.1* 10.7* 11.2*  GFRNONAA 84  --   --  86  --   GFRAA 97  --   --  99  --    < > = values in this interval not displayed.   CrCl cannot be calculated (Patient's most recent lab result is older than the maximum 21 days allowed.).  CMP Latest Ref Rng & Units 08/27/2020 08/19/2020 07/09/2020  Glucose 65 - 99 mg/dL - 120(H) -  BUN 8 - 27 mg/dL - 18 -  Creatinine 0.76 - 1.27 mg/dL - 0.85 -  Sodium 134 - 144 mmol/L - 143 -  Potassium 3.5 - 5.2  mmol/L - 4.2 -  Chloride 96 - 106 mmol/L - 105 -  CO2 20 - 29 mmol/L - 24 -  Calcium 8.6 - 10.3 mg/dL 11.2(H) 10.7(H) 11.1(H)  Total Protein 6.1 - 8.1 g/dL 7.0 - -  Total Bilirubin 0.0 - 1.2 mg/dL - - -  Alkaline Phos 48 - 121 IU/L - - -  AST 0 - 40 IU/L - - -  ALT 0 - 44 IU/L - - -   CBC Latest Ref Rng & Units 01/03/2019 08/23/2018 02/07/2018  WBC 3.4 - 10.8 x10E3/uL 5.8 6.1 6.4  Hemoglobin 13.0 - 17.7 g/dL 16.2 16.1 16.1  Hematocrit 37.5 - 51.0 % 47.8 46.7 46.4  Platelets 150 - 450 x10E3/uL 194 164 189   Lipid Panel     Component Value Date/Time   CHOL 162 04/08/2020 0902   TRIG 223 (H) 04/08/2020 0902   HDL 36 (L) 04/08/2020 0902   CHOLHDL 4.5 04/08/2020 0902   CHOLHDL 3.8 06/03/2016 1201   VLDL 27 06/03/2016 1201   LDLCALC 88 04/08/2020 0902   LDLDIRECT 68.5 09/27/2008 1008   HEMOGLOBIN A1C Lab Results  Component Value Date   HGBA1C 6.1 (H) 08/19/2020   MPG 134 04/01/2017   TSH No results for input(s): TSH in the last 8760 hours.  External Labs:  11/14/2020: Total cholesterol 146, triglycerides 246, HDL 36, LDL 70 A1c 6.2% Glucose 111, BUN 20, creatinine 0.93, GFR 81, sodium 143, potassium 4.4, AST 16, ALT 60  Medications and allergies   Allergies  Allergen Reactions  . Amaryl Other (See Comments)    Gives pt migraines  . Bisacodyl     Causes headaches  . Other Other (See Comments)    Pistachios, lima beans, and some types of chocolate cause migraines  Nasal corticosteroids causes  nose bleeds  . Skelaxin [Metaxalone] Dermatitis and Other (See Comments)    Cherry red rash with sloughing off of skin at left upper chest/shoulder  . Stool Softener [Dss] Other (See Comments)    Causes migraines  . Amlodipine Besylate Swelling  . Norco [Hydrocodone-Acetaminophen] Other (See Comments)    Ineffective  . Pistachio Nut (Diagnostic) Other (See Comments)     In  Addition :Lima beans, milk chocolate   . Levofloxacin Nausea Only  . Lisinopril Cough  . Metformin  And Related Diarrhea                                                                                                                                                                                                                                                                                                                                                                                                                                                                                                         Current Outpatient Medications  Medication Instructions  . acetaminophen (TYLENOL) 650 mg, Oral, 5 times daily  . aspirin 81 mg,  Oral, Every evening  . bimatoprost (LUMIGAN) 0.01 % SOLN 1 drop, Both Eyes, Every evening  . brimonidine-timolol (COMBIGAN) 0.2-0.5 % ophthalmic solution 1 drop, 2 times daily  . brinzolamide (AZOPT) 1 % ophthalmic suspension 1 drop, Both Eyes, 2 times daily  . cloNIDine (CATAPRES) 0.1 MG tablet 1 tablet, Oral, 2 times daily  . empagliflozin (JARDIANCE) 25 mg, Oral, Daily  . FREESTYLE LITE test strip USE TO TEST BLOOD SUGAR ONCE DAILY  . Lancets (FREESTYLE) lancets Use to test blood sugar once daily  . losartan (COZAAR) 50 mg, Oral, Daily  . metoprolol tartrate (LOPRESSOR) 25 MG tablet TAKE 1 TABLET BY MOUTH TWICE DAILY  . Multiple Vitamins-Minerals (PRESERVISION AREDS 2) CAPS 1 capsule, Oral, 2 times daily  . nitroGLYCERIN (NITROSTAT) 0.4 mg, Sublingual, Every 5 min PRN  . Omega-3 Fatty Acids (FISH OIL) 1000 MG CAPS Oral  . OVER THE COUNTER MEDICATION 1,000 mg, Oral, Daily, Microlactin 1000 mg Supplement  . OVER THE COUNTER MEDICATION 2-3 drops, Oral, See admin instructions, CBD Hemp Oil - Place 3 drops in mouth in the morning, place 2 drops in mouth at lunch and dinner, and place 3 drops in mouth at bedtime.   . rosuvastatin (CRESTOR) 20 mg, Oral, Daily  . sitaGLIPtin (JANUVIA) 100 mg, Oral, Daily  . spironolactone (ALDACTONE) 25 mg, Oral, Daily  . SUMAtriptan  (IMITREX) 50 MG tablet TAKE 1 TABLET BY MOUTH EVERY 2 HOURS AS NEEDED FOR MIGRAINE  . TART CHERRY PO Oral  . topiramate (TOPAMAX) 25 mg, Oral, Daily   Radiology:   No results found.  Cardiac Studies:   Coronary angiogram 07/23/2005 PTCA/Stent Mid RCA 2.5 x 18 Cypher drug-eluting stent. Moderate disease in proximal LAD.  Exercise Myoview stress test 11/27/2013: 1. Resting EKG showed normal sinus rhythm, poor R wave progression, non-specific ST-depression in inferior leads, cannot R/O ischemia. Stress EKG was equivocal for ischemia. There was 1.5 mm additional up-sloping ST depression noted at peak exercise, which resolved at > 2 minutes into recovery. Patient exercised on BRUCE PROTOCOL for 7 minutes 1 seconds. The maximum work level achieved was 7.6 MET's. The test was terminated due to achievement of the target heart rate. 2. Perfusion imaging study demonstrated no evidence of ischemia. The right ventricle was prominent and suggests RV dilatation/hypertrophy. Dynamic gated images reveal normal wall motion and endocardial thickening. Left ventricular ejection fraction was estimated to be 73%. This is a low risk study.  Abdominal Aortic Duplex [08/13/2015]: Diffuse plaque noted in the proximal aorta. Focal plaque noted in the mid and distal aorta. mild ectasia noted in the abdominal aorta. No AAA observed. No significant change from 01/24/15, previously noted AAA is probably ectasia.  Echocardiogram 01/11/2018: Left ventricle cavity is normal in size. Mild concentric hypertrophy of the left ventricle.  Normal global wall motion. Doppler evidence of grade I (impaired) diastolic dysfunction, normal LAP. Calculated EF 58%. Left atrial cavity is mildly dilated. Mild (Grade I) mitral regurgitation. Mild chordal SAM. Mild to moderate tricuspid regurgitation. Estimated pulmonary artery systolic pressure 29  mmHg. Mild pulmonic regurgitation. Compared to prior study in 2015, RV function has improved,  PASP has decreased.   Assessment   EKG 01/09/2021: Sinus bradycardia rate of 52 bpm.  Atrial enlargement.  Normal axis.  PRWP, cannot exclude anteroseptal infarct old.  Nonspecific T wave abnormality.  Compared to EKG 01/10/2020, previously noted ST-T abnormalities in inferior leads not present.  EKG 01/10/2020: Normal sinus rhythm with rate of 64 bpm, leftward enlargement, normal axis.  Poor  R wave progression, cannot exclude anteroseptal infarct old.  Nonspecific ST-T abnormality, cannot exclude inferior ischemia.   No significant change from  EKG 01/09/2019 last year   Assessment     ICD-10-CM   1. Essential hypertension  I10   2. Atherosclerosis of native coronary artery of native heart with stable angina pectoris ([place])  I25.118 PCV MYOCARDIAL PERFUSION WITH LEXISCAN  3. Dyspnea on exertion  R06.00 PCV MYOCARDIAL PERFUSION WITH LEXISCAN  4. Bruit of right carotid artery  R09.89 PCV CAROTID DUPLEX (BILATERAL)  5. Right sided temporal headache  R51.9 Ambulatory referral to Neurology       No orders of the defined types were placed in this encounter.   Medications Discontinued During This Encounter  Medication Reason  . hydrALAZINE (APRESOLINE) 25 MG tablet Patient Preference     Recommendations:   [name]  is a 75 y.o. Caucasian male patient with coronary artery disease and has undergone PCI to the RCA on 07/23/2005 with implantation of a 2.5 x 18 Cypher drug-eluting stent.  He has moderate disease in his proximal LAD.  He also has history of hypertension, hyperlipidemia and diabetes mellitus and obstructive sleep apnea on CPAP and compliant and follows Dr. Rexene Alberts.  He has history of spinal stenosis S/P Laminectomy 03/20/2016 and chronic back pain.  Patient presents for 8-week follow-up of hypertension.  At last visit patient was experiencing cough, suspected secondary to losartan.  Therefore advised patient to discontinue losartan and start hydralazine 25 mg 3 times daily.   Patient was unfortunately unable to tolerate hydralazine due to side effects and therefore discontinue this and resumed previous losartan dose.  Patient does report mild intermittent cough has resumed since switching back to losartan, however he prefers to stay on losartan as opposed to trying an ARB.  In regard to patient's symptoms of dyspnea on exertion, suspect this is multifactorial including deconditioning.  However in the past patient has also had symptoms of exertional angina, will therefore obtain Lexiscan nuclear stress test.  In view of soft right-sided carotid bruit will also obtain carotid artery duplex at this time.  Prior to pain his right jaw and temporal region will refer patient to neurology for further evaluation as symptoms are not suggestive of cardiovascular etiology.  Notably unable to take thiazide and thiazide like diuretics due to parathyroid disorder and hypercalcemia. Developed swelling with amlodipine.   Follow-up in 6 months, sooner if needed, for CAD, hypertension, and hyperlipidemia.   Alethia Berthold, PA-C 03/06/2021, 1:16 PM Office: [phone]  CC: Tiana Loft, MD

## 2021-03-06 ENCOUNTER — Ambulatory Visit: Payer: Medicare Other | Admitting: Student

## 2021-03-06 ENCOUNTER — Encounter: Payer: Self-pay | Admitting: Student

## 2021-03-06 ENCOUNTER — Other Ambulatory Visit: Payer: Self-pay

## 2021-03-06 VITALS — BP 119/70 | HR 66 | Temp 97.3°F | Ht 73.0 in | Wt 198.0 lb

## 2021-03-06 DIAGNOSIS — I25118 Atherosclerotic heart disease of native coronary artery with other forms of angina pectoris: Secondary | ICD-10-CM

## 2021-03-06 DIAGNOSIS — R0989 Other specified symptoms and signs involving the circulatory and respiratory systems: Secondary | ICD-10-CM

## 2021-03-06 DIAGNOSIS — R0609 Other forms of dyspnea: Secondary | ICD-10-CM | POA: Diagnosis not present

## 2021-03-06 DIAGNOSIS — I10 Essential (primary) hypertension: Secondary | ICD-10-CM

## 2021-03-06 DIAGNOSIS — R0600 Dyspnea, unspecified: Secondary | ICD-10-CM

## 2021-03-06 DIAGNOSIS — R519 Headache, unspecified: Secondary | ICD-10-CM | POA: Diagnosis not present

## 2021-03-11 ENCOUNTER — Encounter: Payer: Self-pay | Admitting: Neurology

## 2021-03-11 ENCOUNTER — Ambulatory Visit: Payer: Medicare Other

## 2021-03-11 ENCOUNTER — Other Ambulatory Visit: Payer: Self-pay

## 2021-03-11 DIAGNOSIS — R0989 Other specified symptoms and signs involving the circulatory and respiratory systems: Secondary | ICD-10-CM

## 2021-03-19 NOTE — Telephone Encounter (Signed)
From pt

## 2021-03-21 NOTE — Telephone Encounter (Signed)
Thank you :)

## 2021-03-24 ENCOUNTER — Other Ambulatory Visit: Payer: PRIVATE HEALTH INSURANCE

## 2021-03-27 ENCOUNTER — Ambulatory Visit: Payer: Medicare Other | Admitting: Endocrinology

## 2021-03-31 DIAGNOSIS — E213 Hyperparathyroidism, unspecified: Secondary | ICD-10-CM | POA: Diagnosis not present

## 2021-03-31 DIAGNOSIS — Z23 Encounter for immunization: Secondary | ICD-10-CM | POA: Diagnosis not present

## 2021-03-31 DIAGNOSIS — Z0000 Encounter for general adult medical examination without abnormal findings: Secondary | ICD-10-CM | POA: Diagnosis not present

## 2021-03-31 DIAGNOSIS — G43009 Migraine without aura, not intractable, without status migrainosus: Secondary | ICD-10-CM | POA: Diagnosis not present

## 2021-03-31 DIAGNOSIS — R519 Headache, unspecified: Secondary | ICD-10-CM | POA: Diagnosis not present

## 2021-03-31 DIAGNOSIS — I10 Essential (primary) hypertension: Secondary | ICD-10-CM | POA: Diagnosis not present

## 2021-03-31 DIAGNOSIS — I209 Angina pectoris, unspecified: Secondary | ICD-10-CM | POA: Diagnosis not present

## 2021-04-02 DIAGNOSIS — G63 Polyneuropathy in diseases classified elsewhere: Secondary | ICD-10-CM | POA: Diagnosis not present

## 2021-04-02 DIAGNOSIS — G501 Atypical facial pain: Secondary | ICD-10-CM | POA: Diagnosis not present

## 2021-04-07 ENCOUNTER — Other Ambulatory Visit: Payer: Self-pay | Admitting: Cardiology

## 2021-04-07 DIAGNOSIS — I10 Essential (primary) hypertension: Secondary | ICD-10-CM | POA: Diagnosis not present

## 2021-04-07 DIAGNOSIS — G501 Atypical facial pain: Secondary | ICD-10-CM | POA: Diagnosis not present

## 2021-04-07 DIAGNOSIS — E213 Hyperparathyroidism, unspecified: Secondary | ICD-10-CM | POA: Diagnosis not present

## 2021-04-08 ENCOUNTER — Other Ambulatory Visit: Payer: Self-pay

## 2021-04-08 MED ORDER — METOPROLOL TARTRATE 25 MG PO TABS: 25.0000 mg | ORAL_TABLET | Freq: Two times a day (BID) | ORAL | 3 refills | Status: DC

## 2021-04-18 DIAGNOSIS — M961 Postlaminectomy syndrome, not elsewhere classified: Secondary | ICD-10-CM | POA: Diagnosis not present

## 2021-04-18 DIAGNOSIS — M4316 Spondylolisthesis, lumbar region: Secondary | ICD-10-CM | POA: Diagnosis not present

## 2021-04-18 DIAGNOSIS — Z9689 Presence of other specified functional implants: Secondary | ICD-10-CM | POA: Diagnosis not present

## 2021-04-21 DIAGNOSIS — G501 Atypical facial pain: Secondary | ICD-10-CM | POA: Diagnosis not present

## 2021-04-22 ENCOUNTER — Telehealth (INDEPENDENT_AMBULATORY_CARE_PROVIDER_SITE_OTHER): Payer: Self-pay | Admitting: Otolaryngology

## 2021-04-22 NOTE — Telephone Encounter (Signed)
Patient is scheduled on 7/18 and is requesting to have CT Scans requested from [facility].

## 2021-04-30 DIAGNOSIS — H401133 Primary open-angle glaucoma, bilateral, severe stage: Secondary | ICD-10-CM | POA: Diagnosis not present

## 2021-04-30 DIAGNOSIS — E119 Type 2 diabetes mellitus without complications: Secondary | ICD-10-CM | POA: Diagnosis not present

## 2021-04-30 DIAGNOSIS — H50332 Intermittent monocular exotropia, left eye: Secondary | ICD-10-CM | POA: Diagnosis not present

## 2021-04-30 DIAGNOSIS — H2512 Age-related nuclear cataract, left eye: Secondary | ICD-10-CM | POA: Diagnosis not present

## 2021-04-30 DIAGNOSIS — Z961 Presence of intraocular lens: Secondary | ICD-10-CM | POA: Diagnosis not present

## 2021-05-20 ENCOUNTER — Ambulatory Visit: Payer: Medicare Other | Admitting: Neurology

## 2021-05-21 DIAGNOSIS — G501 Atypical facial pain: Secondary | ICD-10-CM | POA: Diagnosis not present

## 2021-06-02 ENCOUNTER — Ambulatory Visit (INDEPENDENT_AMBULATORY_CARE_PROVIDER_SITE_OTHER): Payer: Medicare Other | Admitting: Otolaryngology

## 2021-06-02 ENCOUNTER — Encounter (INDEPENDENT_AMBULATORY_CARE_PROVIDER_SITE_OTHER): Payer: Self-pay | Admitting: Otolaryngology

## 2021-06-02 ENCOUNTER — Other Ambulatory Visit: Payer: Self-pay

## 2021-06-02 VITALS — Temp 95.7°F

## 2021-06-02 DIAGNOSIS — I25118 Atherosclerotic heart disease of native coronary artery with other forms of angina pectoris: Secondary | ICD-10-CM

## 2021-06-02 DIAGNOSIS — J0100 Acute maxillary sinusitis, unspecified: Secondary | ICD-10-CM | POA: Diagnosis not present

## 2021-06-02 DIAGNOSIS — J0120 Acute ethmoidal sinusitis, unspecified: Secondary | ICD-10-CM

## 2021-06-02 MED ORDER — AMOXICILLIN-POT CLAVULANATE 875-125 MG PO TABS: 1.0000 | ORAL_TABLET | Freq: Two times a day (BID) | ORAL | 0 refills | Status: DC

## 2021-06-02 NOTE — Progress Notes (Signed)
HPI: [name] is a 75 y.o. male who presents is referred by Dr. Valora Piccolo for evaluation of sinus infection noticed on recent CT scan of his head.  Patient has been having some atypical facial pain as well as jaw pain.  He underwent a CT scan of his head to evaluate trigeminal neuralgia as well as other possible causes of atypical facial pain.  The CT scan of the sinuses showed opacification of the right maxillary right ethmoid and right frontal sinuses.  He has slight opacification of the left sphenoid.  I reviewed the CT scan which showed opacification of the right ethmoid and right frontal sinus which was small.  He had partial opacification of the left sphenoid.  The left ethmoid and left maxillary appeared clear.  He has had only mild congestion of his nose.  He was treated with a 5-day course of antibiotics I believe azithromycin. He has used saline rinse but apparently had a bad nosebleed associated with Flonase 3 years ago and does not want to use any nasal steroid spray..  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   sup & infil-left buccal  cheek   Ventricular dilatation    Right   Past Surgical History:  Procedure Laterality Date   [place]  2006   CATARACT EXTRACTION Right    CHOLECYSTECTOMY  1990   COLON SURGERY     bowel resection, for diverticulitis    COLONOSCOPY N/A 06/15/2013   Procedure: COLONOSCOPY;  Surgeon: Beryle Beams, MD;  Location: [facility];  Service: Endoscopy;  Laterality: N/A;   COLONOSCOPY WITH PROPOFOL N/A 07/01/2018   Procedure: COLONOSCOPY WITH PROPOFOL;  Surgeon: Carol Ada, MD;  Location: [facility];  Service: Endoscopy;  Laterality: N/A;   CORONARY ANGIOPLASTY     STENT PLACED   LAMINECTOMY WITH POSTERIOR LATERAL ARTHRODESIS LEVEL 1 N/A 04/09/2017   Procedure: Lumbar TwoThree,Lumbar Three-Four,Lumbar Four-Five Laminectomy with Lumbar Five-Sacral One Posterior lateral fusion;  Surgeon: Eustace Moore, MD;  Location: [facility];  Service: Neurosurgery;  Laterality: N/A;   LITHOTRIPSY     POLYPECTOMY  07/01/2018   Procedure: POLYPECTOMY;  Surgeon: Carol Ada, MD;  Location: [facility];  Service: Endoscopy;;   SPINAL CORD STIMULATOR INSERTION N/A 03/20/2016   Procedure: LUMBAR SPINAL CORD STIMULATOR INSERTION;  Surgeon: Clydell Hakim, MD;  Location: [facility];  Service: Neurosurgery;  Laterality: N/A;  [place] ?   Social History   Socioeconomic History   Marital status: Married    Spouse name: Not on file   Number of children: 0   Years of education: Masters +   Highest education level: Not on file  Occupational History   Occupation: Retired  Tobacco Use   Smoking status: Never   Smokeless tobacco: Never  Vaping Use   Vaping Use: Never used  Substance and Sexual Activity   Alcohol use: Yes    Alcohol/week: 0.0 standard drinks    Comment: seldom    Drug use: No   Sexual activity: Yes  Other Topics Concern   Not on file  Social History Narrative   Marital status: married x  1969      Children: none      Lives: with wife, 1 dog      Employment: retired at age 65.  Education music      Tobacco: none      Alcohol: rare      Exercise: stationary bike in 2018; rare use in 2018 due to sick dog.      ADLs: independent with ADLs; drives      Advanced Directives:    2 cups of coffee a day, soda or tea about 3 times a week.    Social Determinants of Health   Financial Resource Strain: Not on file  Food Insecurity: Not on file  Transportation Needs: Not on file  Physical Activity: Not on file  Stress: Not on file  Social Connections: Not on file   Family History  Problem Relation Age of Onset   Heart disease Father    Heart attack Father    Heart disease Paternal Grandmother    Hypercalcemia Neg Hx    Allergies  Allergen Reactions   Amaryl Other (See Comments)    Gives pt migraines   Bisacodyl     Causes headaches   Other Other (See Comments)    Pistachios, lima beans, and some types of chocolate cause migraines  Nasal corticosteroids causes nose bleeds   Skelaxin [Metaxalone] Dermatitis and Other (See Comments)    Cherry red rash with sloughing off of skin at left upper chest/shoulder   Stool Softener [Dss] Other (See Comments)    Causes migraines   Amlodipine Besylate Swelling   Norco [Hydrocodone-Acetaminophen] Other (See Comments)    Ineffective   Pistachio Nut (Diagnostic) Other (See Comments)     In  Addition :Lima beans, milk chocolate    Levofloxacin Nausea Only   Lisinopril Cough   Metformin And Related Diarrhea   Prior to Admission medications   Medication Sig Start Date End Date Taking? Authorizing Provider  acetaminophen (TYLENOL) 650 MG CR tablet Take 650 mg by mouth 5 (five) times daily.     [provider]  aspirin 81 MG tablet Take 81 mg by mouth every evening.     [provider]  bimatoprost (LUMIGAN) 0.01 % SOLN Place 1 drop into both eyes every evening.    [provider]  brimonidine-timolol  (COMBIGAN) 0.2-0.5 % ophthalmic solution Place 1 drop into both eyes 2 (two) times daily.    [provider]  brinzolamide (AZOPT) 1 % ophthalmic suspension Place 1 drop into both eyes 2 (two) times daily.     [provider]  cloNIDine (CATAPRES) 0.1 MG tablet Take 1 tablet by mouth in the morning and at bedtime. 01/08/21  [provider]  empagliflozin (JARDIANCE) 25 MG TABS tablet Take 25 mg by mouth daily. 07/31/19   Daleen Squibb, MD  FREESTYLE LITE test strip USE TO TEST BLOOD SUGAR ONCE DAILY 07/21/19   Forrest Moron, MD  Lancets (FREESTYLE) lancets Use to test blood sugar once daily 07/26/19   Jacelyn Pi, Lilia Argue, MD  losartan (COZAAR) 50 MG tablet Take 50 mg by mouth daily.    [provider]  metoprolol tartrate (LOPRESSOR) 25 MG tablet Take 1 tablet (25 mg total) by mouth 2 (two) times daily. 04/08/21   Adrian Prows, MD  Multiple Vitamins-Minerals (PRESERVISION AREDS 2) CAPS Take 1 capsule by mouth 2 (two) times daily.    [provider]  nitroGLYCERIN (NITROSTAT) 0.4 MG SL tablet Place 1 tablet (0.4 mg total) under the tongue every 5 (five) minutes as needed for up to 25 days for chest pain. 10/24/20 11/18/20  Adrian Prows, MD  Omega-3 Fatty Acids (FISH OIL) 1000 MG CAPS Take by mouth.    [provider]  OVER THE COUNTER MEDICATION Take 1,000 mg by mouth daily. Microlactin 1000 mg Supplement    [provider]  OVER THE COUNTER MEDICATION Take 2-3 drops by mouth See admin instructions. CBD Hemp Oil - Place 3 drops in mouth in the morning, place 2 drops in mouth at lunch and dinner, and place 3 drops in mouth at bedtime.    [provider]  rosuvastatin (CRESTOR) 20 MG tablet Take 1 tablet (20 mg total) by mouth daily. 04/09/20   Jacelyn Pi, Lilia Argue, MD  sitaGLIPtin (JANUVIA) 100 MG tablet Take 1 tablet (100 mg total) by mouth daily. 07/31/19   Daleen Squibb, MD  spironolactone (ALDACTONE) 25 MG tablet Take 1  tablet (25 mg total) by mouth daily. 08/13/20   Daleen Squibb, MD  SUMAtriptan (IMITREX) 50 MG tablet TAKE 1 TABLET BY MOUTH EVERY 2 HOURS AS NEEDED FOR MIGRAINE 08/12/20   Jacelyn Pi, Lilia Argue, MD  TART CHERRY PO Take by mouth.    [provider]  topiramate (TOPAMAX) 25 MG capsule Take 25 mg by mouth daily.    [provider]     Positive ROS: Otherwise negative.  All other systems have been reviewed and were otherwise negative with the exception of those mentioned in the HPI and as above.  Physical Exam: Constitutional: Alert, well-appearing, no acute distress. Ears: External ears without lesions or tenderness. Ear canals are clear bilaterally with intact, clear TMs bilaterally. Nasal: External nose without lesions. Septum deviated to the left..  On nasal endoscopy he has mild edema of the right middle meatus area with minimal thick mucus discharge.  Do not appreciate any polypoid tissue or gross mucopurulent discharge.  The nasopharynx is clear.  The left middle meatus region is clear and the left nasopharynx is clear. Oral: Lips and gums without lesions. Tongue and palate mucosa without lesions. Posterior oropharynx clear. Neck: No palpable adenopathy or masses Respiratory: Breathing comfortably  Skin: No facial/neck lesions or rash noted.  Procedures  Assessment: Sinusitis on previous CT scan 5 weeks ago involving mostly the right ethmoid maxillary and frontal sinuses.  Mild left sphenoid sinus disease.  Plan: He seems to be doing better today.  I went had prescribed another course of antibiotics Augmentin 875 mg twice daily for 10 days. He would probably benefit from use of nasal steroid sprays but since he had a bad nosebleed associated with Flonase several years  ago he does not want to use any nasal steroid spray.  Suggested use of saline irrigations which he has used in the past. He will follow-up as needed.   Radene Journey, MD   CC:

## 2021-06-03 DIAGNOSIS — M961 Postlaminectomy syndrome, not elsewhere classified: Secondary | ICD-10-CM | POA: Diagnosis not present

## 2021-06-03 DIAGNOSIS — M461 Sacroiliitis, not elsewhere classified: Secondary | ICD-10-CM | POA: Diagnosis not present

## 2021-06-09 ENCOUNTER — Encounter: Payer: Self-pay | Admitting: Dermatology

## 2021-06-09 ENCOUNTER — Ambulatory Visit (INDEPENDENT_AMBULATORY_CARE_PROVIDER_SITE_OTHER): Payer: Medicare Other | Admitting: Dermatology

## 2021-06-09 ENCOUNTER — Other Ambulatory Visit: Payer: Self-pay

## 2021-06-09 DIAGNOSIS — D044 Carcinoma in situ of skin of scalp and neck: Secondary | ICD-10-CM

## 2021-06-09 DIAGNOSIS — L570 Actinic keratosis: Secondary | ICD-10-CM | POA: Diagnosis not present

## 2021-06-09 DIAGNOSIS — C4442 Squamous cell carcinoma of skin of scalp and neck: Secondary | ICD-10-CM | POA: Diagnosis not present

## 2021-06-09 DIAGNOSIS — D229 Melanocytic nevi, unspecified: Secondary | ICD-10-CM

## 2021-06-09 DIAGNOSIS — D485 Neoplasm of uncertain behavior of skin: Secondary | ICD-10-CM

## 2021-06-09 DIAGNOSIS — C4492 Squamous cell carcinoma of skin, unspecified: Secondary | ICD-10-CM

## 2021-06-09 HISTORY — DX: Squamous cell carcinoma of skin, unspecified: C44.92

## 2021-06-09 NOTE — Patient Instructions (Signed)

## 2021-06-14 ENCOUNTER — Encounter: Payer: Self-pay | Admitting: Dermatology

## 2021-06-14 NOTE — Progress Notes (Signed)
Follow-Up Visit   Subjective  [name] is a 75 y.o. male who presents for the following: Follow-up (Scalp nonhealing lesion, x months no treatment done by kelli. History of scc per epic ).  Several new crusts, largest on scalp Location:  Duration:  Quality:  Associated Signs/Symptoms: Modifying Factors:  Severity:  Timing: Context:   Objective  Well appearing patient in no apparent distress; mood and affect are within normal limits.   Mid Parietal Scalp superior Centimeter heaped up pink crust     Mid Parietal Scalp inferior 1.2 cm thick flat waxy pink crust     Right Forearm - Posterior Horn like 3 mm pink crust    All skin waist up examined.   Assessment & Plan    Neoplasm of uncertain behavior of skin (2) Mid Parietal Scalp superior  Skin / nail biopsy Type of biopsy: tangential   Informed consent: discussed and consent obtained   Timeout: patient name, date of birth, surgical site, and procedure verified   Anesthesia: the lesion was anesthetized in a standard fashion   Anesthetic:  1% lidocaine w/ epinephrine 1-100,000 local infiltration Instrument used: flexible razor blade   Hemostasis achieved with: aluminum chloride and electrodesiccation   Outcome: patient tolerated procedure well   Post-procedure details: wound care instructions given    Destruction of lesion Complexity: simple   Destruction method: electrodesiccation and curettage   Informed consent: discussed and consent obtained   Timeout:  patient name, date of birth, surgical site, and procedure verified Anesthesia: the lesion was anesthetized in a standard fashion   Anesthetic:  1% lidocaine w/ epinephrine 1-100,000 local infiltration Curettage performed in three different directions: Yes   Electrodesiccation performed over the curetted area: Yes   Curettage cycles:  3 Lesion length (cm):  1 Lesion width (cm):  1 Margin per side (cm):  0 Final wound size (cm):   1 Hemostasis achieved with:  aluminum chloride Outcome: patient tolerated procedure well with no complications   Post-procedure details: wound care instructions given    Specimen 1 - Surgical pathology Differential Diagnosis: bcc scc ak tx with bx  Check Margins: No  Mid Parietal Scalp inferior  Skin / nail biopsy Type of biopsy: tangential   Informed consent: discussed and consent obtained   Timeout: patient name, date of birth, surgical site, and procedure verified   Anesthesia: the lesion was anesthetized in a standard fashion   Anesthetic:  1% lidocaine w/ epinephrine 1-100,000 local infiltration Instrument used: flexible razor blade   Hemostasis achieved with: aluminum chloride and electrodesiccation   Outcome: patient tolerated procedure well   Post-procedure details: wound care instructions given    Destruction of lesion Complexity: simple   Destruction method: electrodesiccation and curettage   Informed consent: discussed and consent obtained   Timeout:  patient name, date of birth, surgical site, and procedure verified Anesthesia: the lesion was anesthetized in a standard fashion   Anesthetic:  1% lidocaine w/ epinephrine 1-100,000 local infiltration Curettage performed in three different directions: Yes   Electrodesiccation performed over the curetted area: Yes   Curettage cycles:  3 Lesion length (cm):  1.4 Lesion width (cm):  1.4 Margin per side (cm):  0 Final wound size (cm):  1.4 Hemostasis achieved with:  aluminum chloride Outcome: patient tolerated procedure well with no complications   Post-procedure details: wound care instructions given    Specimen 2 - Surgical pathology Differential Diagnosis: bcc scc ak tx with bx   Check Margins: No  AK (actinic keratosis) Right Forearm - Posterior  Destruction of lesion - Right Forearm - Posterior Complexity: simple   Destruction method: cryotherapy   Informed consent: discussed and consent obtained    Timeout:  patient name, date of birth, surgical site, and procedure verified Lesion destroyed using liquid nitrogen: Yes   Cryotherapy cycles:  3 Outcome: patient tolerated procedure well with no complications   Post-procedure details: wound care instructions given    Atypical mole      I, Tony Monarch, MD, have reviewed all documentation for this visit.  The documentation on 06/14/21 for the exam, diagnosis, procedures, and orders are all accurate and complete.

## 2021-06-16 ENCOUNTER — Telehealth: Payer: Self-pay

## 2021-06-16 NOTE — Telephone Encounter (Signed)
Phone call to patient with his pathology results. Patient aware of results.  

## 2021-06-16 NOTE — Telephone Encounter (Signed)
-----   Message from [name], MD sent at 06/13/2021  5:19 PM EDT ----- Schedule surgery with Dr. Darene Lamer

## 2021-06-23 DIAGNOSIS — M545 Low back pain, unspecified: Secondary | ICD-10-CM | POA: Diagnosis not present

## 2021-06-23 DIAGNOSIS — R059 Cough, unspecified: Secondary | ICD-10-CM | POA: Diagnosis not present

## 2021-06-23 DIAGNOSIS — I152 Hypertension secondary to endocrine disorders: Secondary | ICD-10-CM | POA: Diagnosis not present

## 2021-06-23 DIAGNOSIS — E1169 Type 2 diabetes mellitus with other specified complication: Secondary | ICD-10-CM | POA: Diagnosis not present

## 2021-06-23 DIAGNOSIS — G8929 Other chronic pain: Secondary | ICD-10-CM | POA: Diagnosis not present

## 2021-06-23 DIAGNOSIS — M542 Cervicalgia: Secondary | ICD-10-CM | POA: Diagnosis not present

## 2021-06-23 DIAGNOSIS — E785 Hyperlipidemia, unspecified: Secondary | ICD-10-CM | POA: Diagnosis not present

## 2021-06-23 DIAGNOSIS — E119 Type 2 diabetes mellitus without complications: Secondary | ICD-10-CM | POA: Diagnosis not present

## 2021-06-23 DIAGNOSIS — G501 Atypical facial pain: Secondary | ICD-10-CM | POA: Diagnosis not present

## 2021-06-23 DIAGNOSIS — E1159 Type 2 diabetes mellitus with other circulatory complications: Secondary | ICD-10-CM | POA: Diagnosis not present

## 2021-06-25 DIAGNOSIS — M461 Sacroiliitis, not elsewhere classified: Secondary | ICD-10-CM | POA: Diagnosis not present

## 2021-07-02 ENCOUNTER — Ambulatory Visit: Payer: Medicare Other | Admitting: Neurology

## 2021-07-03 ENCOUNTER — Encounter: Payer: Self-pay | Admitting: Dermatology

## 2021-07-03 ENCOUNTER — Ambulatory Visit (INDEPENDENT_AMBULATORY_CARE_PROVIDER_SITE_OTHER): Payer: Medicare Other | Admitting: Dermatology

## 2021-07-03 ENCOUNTER — Other Ambulatory Visit: Payer: Self-pay

## 2021-07-03 DIAGNOSIS — D044 Carcinoma in situ of skin of scalp and neck: Secondary | ICD-10-CM | POA: Diagnosis not present

## 2021-07-03 DIAGNOSIS — C4492 Squamous cell carcinoma of skin, unspecified: Secondary | ICD-10-CM

## 2021-07-03 DIAGNOSIS — D099 Carcinoma in situ, unspecified: Secondary | ICD-10-CM

## 2021-07-03 DIAGNOSIS — C4442 Squamous cell carcinoma of skin of scalp and neck: Secondary | ICD-10-CM

## 2021-07-03 NOTE — Patient Instructions (Signed)

## 2021-07-08 DIAGNOSIS — M792 Neuralgia and neuritis, unspecified: Secondary | ICD-10-CM | POA: Diagnosis not present

## 2021-07-08 DIAGNOSIS — M5412 Radiculopathy, cervical region: Secondary | ICD-10-CM | POA: Diagnosis not present

## 2021-07-10 DIAGNOSIS — Z87442 Personal history of urinary calculi: Secondary | ICD-10-CM | POA: Diagnosis not present

## 2021-07-10 DIAGNOSIS — E210 Primary hyperparathyroidism: Secondary | ICD-10-CM | POA: Diagnosis not present

## 2021-07-10 DIAGNOSIS — Z13820 Encounter for screening for osteoporosis: Secondary | ICD-10-CM | POA: Diagnosis not present

## 2021-07-14 ENCOUNTER — Telehealth: Payer: Self-pay | Admitting: Dermatology

## 2021-07-14 NOTE — Telephone Encounter (Signed)
Patient calling to give ST update on treatment areas. He states that both areas are healing up nicely and he has no concerns at this time.

## 2021-07-17 ENCOUNTER — Encounter: Payer: Self-pay | Admitting: Dermatology

## 2021-07-17 NOTE — Progress Notes (Signed)
   Follow-Up Visit   Subjective  [name] is a 75 y.o. 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  Biopsy-proven skin cancers scalp Location:  Duration:  Quality:  Associated Signs/Symptoms: Modifying Factors:  Severity:  Timing: Context:   Objective  Well appearing patient in no apparent distress; mood and affect are within normal limits. Mid Parietal Scalp Superior Lesion identified by Dr.Samiel Peel and nurse in room.    Mid Parietal Scalp Inferior Lesion identified by Dr.Tima Curet and nurse in room.      A focused examination was performed including head and neck. Relevant physical exam findings are noted in the Assessment and Plan.   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3 Hemostasis achieved with:  ferric subsulfate Outcome: patient tolerated procedure well with no complications   Post-procedure details: sterile dressing applied and wound care instructions given   Dressing type: petrolatum   Additional details:  Wound innoculated with 5 fluorouracil solution.  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  Destruction of lesion Complexity: simple   Destruction method: electrodesiccation  and curettage   Informed consent: discussed and consent obtained   Timeout:  patient name, date of birth, surgical site, and procedure verified Anesthesia: the lesion was anesthetized in a standard fashion   Anesthetic:  1% lidocaine w/ epinephrine 1-100,000 local infiltration Curettage performed in three different directions: Yes     Electrodesiccation performed over the curetted area: No   Curettage cycles:  3 Lesion length (cm):  1.6 Lesion width (cm):  1.6 Margin per side (cm):  0 Final wound size (cm):  1.6 Hemostasis achieved with:  ferric subsulfate Outcome: patient tolerated procedure well with no complications   Post-procedure details: sterile dressing applied and wound care instructions given   Dressing type: petrolatum   Additional details:  Wound innoculated with 5 fluorouracil solution.      I, [name], MD, have reviewed all documentation for this visit.  The documentation on 07/17/21 for the exam, diagnosis, procedures, and orders are all accurate and complete.

## 2021-07-28 DIAGNOSIS — M5412 Radiculopathy, cervical region: Secondary | ICD-10-CM | POA: Diagnosis not present

## 2021-08-04 DIAGNOSIS — Z23 Encounter for immunization: Secondary | ICD-10-CM | POA: Diagnosis not present

## 2021-08-07 DIAGNOSIS — M85851 Other specified disorders of bone density and structure, right thigh: Secondary | ICD-10-CM | POA: Diagnosis not present

## 2021-08-07 DIAGNOSIS — Z13820 Encounter for screening for osteoporosis: Secondary | ICD-10-CM | POA: Diagnosis not present

## 2021-08-07 DIAGNOSIS — E210 Primary hyperparathyroidism: Secondary | ICD-10-CM | POA: Diagnosis not present

## 2021-08-26 DIAGNOSIS — Z20822 Contact with and (suspected) exposure to covid-19: Secondary | ICD-10-CM | POA: Diagnosis not present

## 2021-09-02 DIAGNOSIS — G8929 Other chronic pain: Secondary | ICD-10-CM | POA: Diagnosis not present

## 2021-09-02 DIAGNOSIS — M25512 Pain in left shoulder: Secondary | ICD-10-CM | POA: Diagnosis not present

## 2021-09-02 DIAGNOSIS — M25511 Pain in right shoulder: Secondary | ICD-10-CM | POA: Diagnosis not present

## 2021-09-03 DIAGNOSIS — M25512 Pain in left shoulder: Secondary | ICD-10-CM | POA: Diagnosis not present

## 2021-09-03 DIAGNOSIS — H50332 Intermittent monocular exotropia, left eye: Secondary | ICD-10-CM | POA: Diagnosis not present

## 2021-09-03 DIAGNOSIS — H401133 Primary open-angle glaucoma, bilateral, severe stage: Secondary | ICD-10-CM | POA: Diagnosis not present

## 2021-09-03 DIAGNOSIS — E119 Type 2 diabetes mellitus without complications: Secondary | ICD-10-CM | POA: Diagnosis not present

## 2021-09-03 DIAGNOSIS — H2512 Age-related nuclear cataract, left eye: Secondary | ICD-10-CM | POA: Diagnosis not present

## 2021-09-03 DIAGNOSIS — G8929 Other chronic pain: Secondary | ICD-10-CM | POA: Diagnosis not present

## 2021-09-03 DIAGNOSIS — M25511 Pain in right shoulder: Secondary | ICD-10-CM | POA: Diagnosis not present

## 2021-09-03 DIAGNOSIS — Z961 Presence of intraocular lens: Secondary | ICD-10-CM | POA: Diagnosis not present

## 2021-09-09 ENCOUNTER — Encounter: Payer: Self-pay | Admitting: Student

## 2021-09-09 ENCOUNTER — Ambulatory Visit: Payer: Medicare Other | Admitting: Student

## 2021-09-09 ENCOUNTER — Other Ambulatory Visit: Payer: Self-pay

## 2021-09-09 VITALS — BP 131/79 | HR 62 | Temp 97.7°F | Ht 73.0 in | Wt 201.0 lb

## 2021-09-09 DIAGNOSIS — E781 Pure hyperglyceridemia: Secondary | ICD-10-CM

## 2021-09-09 DIAGNOSIS — I25118 Atherosclerotic heart disease of native coronary artery with other forms of angina pectoris: Secondary | ICD-10-CM | POA: Diagnosis not present

## 2021-09-09 DIAGNOSIS — I10 Essential (primary) hypertension: Secondary | ICD-10-CM

## 2021-09-09 NOTE — Progress Notes (Signed)
Primary Physician/Referring:  Sue Lush, PA-C  Patient ID: [name], male    DOB: 06/30/1946, 75 y.o.   MRN: [id number]  Chief Complaint  Patient presents with   Coronary Artery Disease   Hyperlipidemia   Hypertension   Follow-up   HPI:    [name]  is a 75 y.o. Caucasian male patient with coronary artery disease and has undergone PCI to the RCA on 07/23/2005 with implantation of a 2.5 x 18 Cypher drug-eluting stent.  He has moderate disease in his proximal LAD.  He also has history of hypertension, hyperlipidemia and diabetes mellitus and obstructive sleep apnea on CPAP and compliant and follows Dr. Rexene Alberts.  He has history of spinal stenosis S/P Laminectomy 03/20/2016 and chronic back pain.  Patient presents for 23-monthfollow-up of CAD, hypertension, hyperlipidemia.  On last visit had recommended stress test given dyspnea on exertion, however patient opted to hold off.  Patient continues to have occasional episodes of dyspnea on exertion which is stable.  He also has occasional episodes of nonexertional chest discomfort which is brief and unchanged compared to previous.  Patient verbalized understanding that without further evaluation we are unable to determine progression of CAD, however as he is feeling relatively well he does not wish to proceed with additional evaluation at this time.  Since last office visit patient was diagnosed with trigeminal neuralgia for which she has now taking baclofen which has significantly improved his right-sided jaw and neck discomfort.  Past Medical History:  Diagnosis Date   Arthritis    DDD, scoliosis, stenosis     Atherosclerotic heart disease native coronary artery w/angina pectoris (HCC)    Calcium blood increased    Cancer (HCC)    skin ca, posterior R ear   (BENIGN)   Complication of anesthesia    difficulty intubation , anterior larynx, easy mask ventilation   Coronary artery disease    Diabetes mellitus without  complication ([place]    Difficult intubation    Diverticulitis    Glaucoma    Headache(784.0)    migraines   History of kidney stones    Hyperlipemia    Hypertension    Right ventricular dilation, 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   sup & infil-left buccal cheek   Ventricular dilatation    Right   Past Surgical History:  Procedure Laterality Date   [place] 2006   CATARACT EXTRACTION Right    CHOLECYSTECTOMY  1990   COLON SURGERY     bowel resection, for diverticulitis    COLONOSCOPY N/A 06/15/2013   Procedure: COLONOSCOPY;  Surgeon: PBeryle Beams MD;  Location: [facility];  Service: Endoscopy;  Laterality: N/A;   COLONOSCOPY WITH PROPOFOL N/A 07/01/2018   Procedure: COLONOSCOPY WITH PROPOFOL;  Surgeon: HCarol Ada MD;  Location: [facility];  Service: Endoscopy;  Laterality: N/A;   CORONARY  ANGIOPLASTY     STENT PLACED   LAMINECTOMY WITH POSTERIOR LATERAL ARTHRODESIS LEVEL 1 N/A 04/09/2017   Procedure: Lumbar TwoThree,Lumbar Three-Four,Lumbar Four-Five Laminectomy with Lumbar Five-Sacral One Posterior lateral fusion;  Surgeon: Eustace Moore, MD;  Location: [facility];  Service: Neurosurgery;  Laterality: N/A;   LITHOTRIPSY     POLYPECTOMY  07/01/2018   Procedure: POLYPECTOMY;  Surgeon: Carol Ada, MD;  Location: [facility];  Service: Endoscopy;;   SPINAL CORD STIMULATOR INSERTION N/A 03/20/2016   Procedure: LUMBAR SPINAL CORD STIMULATOR INSERTION;  Surgeon:  Clydell Hakim, MD;  Location: [facility];  Service: Neurosurgery;  Laterality: N/A;   [facility] ?   Family History  Problem Relation Age of Onset   Heart disease Father    Heart attack Father    Heart disease Paternal Grandmother    Hypercalcemia Neg Hx     Social History   Tobacco Use   Smoking status: Never   Smokeless tobacco: Never  Substance Use Topics   Alcohol use: Yes    Alcohol/week: 0.0 standard drinks    Comment: seldom    ROS  Review of Systems  Constitutional: Negative for malaise/fatigue and weight gain.  Cardiovascular:  Positive for chest pain (occasional, stable) and dyspnea on exertion (stable). Negative for claudication, leg swelling, near-syncope, orthopnea, palpitations, paroxysmal nocturnal dyspnea and syncope.  Respiratory:  Positive for cough. Negative for shortness of breath.   Hematologic/Lymphatic: Does not bruise/bleed easily.  Musculoskeletal:  Positive for arthritis (gout) and back pain (chronic).  Gastrointestinal:  Positive for heartburn. Negative for melena.  Neurological:  Negative for dizziness and weakness.  Objective  Blood pressure 131/79, pulse 62, temperature 97.7 F (36.5 C), temperature source Temporal, height 6' 1"  (1.854 m), weight 201 lb (91.2 kg), SpO2 97 %.  Vitals with BMI 09/09/2021 03/06/2021 01/09/2021  Height 6' 1"  6' 1"  6' 1"   Weight 201 lbs 198 lbs 198 lbs  BMI 26.52 10.93 23.55  Systolic 732 202 542  Diastolic 79 70 77  Pulse 62 66 63     Physical Exam Vitals reviewed.  HENT:     Head: Normocephalic and atraumatic.     Comments: No tenderness to palpation of temporal regions. Neck:     Vascular: Carotid bruit (right) present.  Cardiovascular:     Rate and Rhythm: Normal rate and regular rhythm.     Pulses: Intact distal pulses.     Heart sounds: Normal heart sounds, S1 normal and S2 normal. No murmur heard.   No gallop.     Comments: No leg edema, no JVD. Except right DP,  all pulses 2 plus and normal.  Pulmonary:     Effort: Pulmonary effort is normal. No respiratory distress.     Breath sounds: Normal breath sounds. No wheezing, rhonchi or rales.  Musculoskeletal:     Right lower leg: No edema.     Left lower leg: No edema.  Skin:    General: Skin is warm and dry.     Findings: No erythema or lesion.  Neurological:     Mental Status: He is alert.   Laboratory examination:   No results for input(s): NA, K, CL, CO2, GLUCOSE, BUN, CREATININE, CALCIUM, GFRNONAA, GFRAA in the last 8760 hours.  CrCl cannot be calculated (Patient's most recent lab result is older than the maximum 21 days allowed.).  CMP Latest Ref Rng & Units 08/27/2020 08/19/2020 07/09/2020  Glucose 65 - 99  mg/dL - 120(H) -  BUN 8 - 27 mg/dL - 18 -  Creatinine 0.76 - 1.27 mg/dL - 0.85 -  Sodium 134 - 144 mmol/L - 143 -  Potassium 3.5 - 5.2 mmol/L - 4.2 -  Chloride 96 - 106 mmol/L - 105 -  CO2 20 - 29 mmol/L - 24 -  Calcium 8.6 - 10.3 mg/dL 11.2(H) 10.7(H) 11.1(H)  Total Protein 6.1 - 8.1 g/dL 7.0 - -  Total Bilirubin 0.0 - 1.2 mg/dL - - -  Alkaline Phos 48 - 121 IU/L - - -  AST 0 - 40 IU/L - - -  ALT 0 - 44 IU/L - - -   CBC Latest Ref Rng & Units 01/03/2019 08/23/2018 02/07/2018  WBC 3.4 - 10.8 x10E3/uL 5.8 6.1 6.4  Hemoglobin 13.0 - 17.7 g/dL 16.2 16.1 16.1  Hematocrit 37.5 - 51.0 % 47.8 46.7 46.4  Platelets 150 - 450 x10E3/uL 194 164 189   Lipid Panel     Component Value Date/Time   CHOL 162 04/08/2020 0902   TRIG 223 (H) 04/08/2020 0902   HDL 36 (L) 04/08/2020 0902   CHOLHDL 4.5 04/08/2020 0902   CHOLHDL 3.8 06/03/2016 1201   VLDL 27 06/03/2016 1201   LDLCALC 88 04/08/2020 0902   LDLDIRECT 68.5 09/27/2008 1008   HEMOGLOBIN A1C Lab Results  Component Value Date   HGBA1C 6.1 (H) 08/19/2020   MPG 134 04/01/2017   TSH No results for input(s): TSH in the last 8760 hours.  External Labs:  06/23/2021: HDL 37, LDL 65, total cholesterol 142, triglycerides 259, A1c  6.1% BUN 18, creatinine 0.84, GFR >60  11/14/2020: Total cholesterol 146, triglycerides 246, HDL 36, LDL 70 A1c 6.2% Glucose 111, BUN 20, creatinine 0.93, GFR 81, sodium 143, potassium 4.4, AST 16, ALT 60  Allergies   Allergies  Allergen Reactions   Banana Anaphylaxis   Fluticasone Propionate     Other reaction(s): Other nosebleed   Amaryl Other (See Comments)    Gives pt migraines   Benzalk Cl-Oxyquinoline Sulf Other (See Comments)    Nose Bleeds   Bisacodyl     Causes headaches   Chlorthalidone     Other reaction(s): Other High Glucose levels   Hydrocodone Rash    Ineffective    Other Other (See Comments)    Pistachios, lima beans, and some types of chocolate cause migraines  Nasal corticosteroids causes nose bleeds   Skelaxin [Metaxalone] Dermatitis and Other (See Comments)    Cherry red rash with sloughing off of skin at left upper chest/shoulder   Stool Softener [Dss] Other (See Comments)    Causes migraines   Amlodipine Besylate Swelling   Norco [Hydrocodone-Acetaminophen] Other (See Comments)    Ineffective   Pistachio Nut (Diagnostic) Other (See Comments)     In  Addition :Lima beans, milk chocolate    Levofloxacin Nausea Only   Lisinopril Cough   Metformin And Related Diarrhea      Medications Prior to Visit:   Outpatient Medications Prior to Visit  Medication Sig Dispense Refill   acetaminophen (TYLENOL) 650 MG CR tablet Take 650 mg by mouth 5 (five) times daily.      aspirin 81 MG tablet Take 81 mg by mouth every evening.      baclofen (LIORESAL) 10 MG tablet Take by mouth.     bimatoprost (LUMIGAN) 0.01 % SOLN Place 1 drop into both eyes every evening.     brimonidine-timolol (COMBIGAN) 0.2-0.5 % ophthalmic solution Place 1 drop into  both eyes 2 (two) times daily.     brinzolamide (AZOPT) 1 % ophthalmic suspension Place 1 drop into both eyes 2 (two) times daily.      cloNIDine (CATAPRES) 0.1 MG tablet Take 1 tablet by mouth in the morning and at  bedtime.     empagliflozin (JARDIANCE) 25 MG TABS tablet Take 25 mg by mouth daily. 90 tablet 3   FREESTYLE LITE test strip USE TO TEST BLOOD SUGAR ONCE DAILY 100 strip 3   Lancets (FREESTYLE) lancets Use to test blood sugar once daily 100 each 12   losartan (COZAAR) 50 MG tablet Take 50 mg by mouth daily.     metoprolol tartrate (LOPRESSOR) 25 MG tablet Take 1 tablet (25 mg total) by mouth 2 (two) times daily. 180 tablet 3   Multiple Vitamin (MULTIVITAMIN ADULT PO)      Multiple Vitamins-Minerals (PRESERVISION AREDS 2) CAPS Take 1 capsule by mouth 2 (two) times daily.     nitroGLYCERIN (NITROSTAT) 0.4 MG SL tablet Place 1 tablet (0.4 mg total) under the tongue every 5 (five) minutes as needed for up to 25 days for chest pain. 25 tablet 0   Omega-3 Fatty Acids (FISH OIL) 1000 MG CAPS Take by mouth.     OVER THE COUNTER MEDICATION Take 1,000 mg by mouth daily. Microlactin 1000 mg Supplement     OVER THE COUNTER MEDICATION Take 2-3 drops by mouth See admin instructions. CBD Hemp Oil - Place 3 drops in mouth in the morning, place 2 drops in mouth at lunch and dinner, and place 3 drops in mouth at bedtime.     rosuvastatin (CRESTOR) 20 MG tablet Take 1 tablet (20 mg total) by mouth daily. 90 tablet 3   sitaGLIPtin (JANUVIA) 100 MG tablet Take 1 tablet (100 mg total) by mouth daily. 90 tablet 3   spironolactone (ALDACTONE) 25 MG tablet Take 1 tablet (25 mg total) by mouth daily. 30 tablet 3   SUMAtriptan (IMITREX) 50 MG tablet TAKE 1 TABLET BY MOUTH EVERY 2 HOURS AS NEEDED FOR MIGRAINE 9 tablet 3   TART CHERRY PO Take by mouth.     topiramate (TOPAMAX) 25 MG capsule Take 25 mg by mouth daily.     amoxicillin-clavulanate (AUGMENTIN) 875-125 MG tablet Take 1 tablet by mouth 2 (two) times daily. 20 tablet 0   SUMAtriptan (IMITREX) 50 MG tablet      No facility-administered medications prior to visit.   Final Medications at End of Visit    Current Meds  Medication Sig   acetaminophen (TYLENOL) 650  MG CR tablet Take 650 mg by mouth 5 (five) times daily.    aspirin 81 MG tablet Take 81 mg by mouth every evening.    baclofen (LIORESAL) 10 MG tablet Take by mouth.   bimatoprost (LUMIGAN) 0.01 % SOLN Place 1 drop into both eyes every evening.   brimonidine-timolol (COMBIGAN) 0.2-0.5 % ophthalmic solution Place 1 drop into both eyes 2 (two) times daily.   brinzolamide (AZOPT) 1 % ophthalmic suspension Place 1 drop into both eyes 2 (two) times daily.    cloNIDine (CATAPRES) 0.1 MG tablet Take 1 tablet by mouth in the morning and at bedtime.   empagliflozin (JARDIANCE) 25 MG TABS tablet Take 25 mg by mouth daily.   FREESTYLE LITE test strip USE TO TEST BLOOD SUGAR ONCE DAILY   Lancets (FREESTYLE) lancets Use to test blood sugar once daily   losartan (COZAAR) 50 MG tablet Take 50 mg by mouth daily.  metoprolol tartrate (LOPRESSOR) 25 MG tablet Take 1 tablet (25 mg total) by mouth 2 (two) times daily.   Multiple Vitamin (MULTIVITAMIN ADULT PO)    Multiple Vitamins-Minerals (PRESERVISION AREDS 2) CAPS Take 1 capsule by mouth 2 (two) times daily.   nitroGLYCERIN (NITROSTAT) 0.4 MG SL tablet Place 1 tablet (0.4 mg total) under the tongue every 5 (five) minutes as needed for up to 25 days for chest pain.   Omega-3 Fatty Acids (FISH OIL) 1000 MG CAPS Take by mouth.   OVER THE COUNTER MEDICATION Take 1,000 mg by mouth daily. Microlactin 1000 mg Supplement   OVER THE COUNTER MEDICATION Take 2-3 drops by mouth See admin instructions. CBD Hemp Oil - Place 3 drops in mouth in the morning, place 2 drops in mouth at lunch and dinner, and place 3 drops in mouth at bedtime.   rosuvastatin (CRESTOR) 20 MG tablet Take 1 tablet (20 mg total) by mouth daily.   sitaGLIPtin (JANUVIA) 100 MG tablet Take 1 tablet (100 mg total) by mouth daily.   spironolactone (ALDACTONE) 25 MG tablet Take 1 tablet (25 mg total) by mouth daily.   SUMAtriptan (IMITREX) 50 MG tablet TAKE 1 TABLET BY MOUTH EVERY 2 HOURS AS NEEDED FOR  MIGRAINE   TART CHERRY PO Take by mouth.   [DISCONTINUED] topiramate (TOPAMAX) 25 MG capsule Take 25 mg by mouth daily.   Radiology:   No results found.  Cardiac Studies:   Coronary angiogram 07/23/2005 PTCA/Stent Mid RCA 2.5 x 18 Cypher drug-eluting stent. Moderate disease in proximal LAD.  Exercise Myoview stress test 11/27/2013: 1. Resting EKG showed normal sinus rhythm, poor R wave progression, non-specific ST-depression in inferior leads, cannot R/O ischemia. Stress EKG was equivocal for ischemia. There was 1.5 mm additional up-sloping ST depression noted at peak exercise, which resolved at > 2 minutes into recovery. Patient exercised on BRUCE PROTOCOL for 7 minutes 1 seconds. The maximum work level achieved was 7.6 MET's. The test was terminated due to achievement of the target heart rate. 2. Perfusion imaging study demonstrated no evidence of ischemia. The right ventricle was prominent and suggests RV dilatation/hypertrophy. Dynamic gated images reveal normal wall motion and endocardial thickening. Left ventricular ejection fraction was estimated to be 73%. This is a low risk study.  Abdominal Aortic Duplex  [08/13/2015]: Diffuse plaque noted in the proximal aorta. Focal plaque noted in the mid and distal aorta. mild ectasia noted in the abdominal aorta. No AAA observed. No significant change from 01/24/15, previously noted AAA is probably ectasia.  Echocardiogram 01/11/2018: Left ventricle cavity is normal in size. Mild concentric hypertrophy of the left ventricle.  Normal global wall motion. Doppler evidence of grade I (impaired) diastolic dysfunction, normal LAP. Calculated EF 58%. Left atrial cavity is mildly dilated. Mild (Grade I) mitral regurgitation. Mild chordal SAM. Mild to moderate tricuspid regurgitation. Estimated pulmonary artery systolic pressure 29  mmHg. Mild pulmonic regurgitation. Compared to prior study in 2015, RV function has improved, PASP has decreased.  Carotid  artery duplex 03/11/2021:  Duplex suggests stenosis in the right and left external carotid artery (<50%).  Minimal homogenous plaque present bilateral carotid arteries. No significant ICA stenosis bilateral.  Antegrade right vertebral artery flow. Antegrade left vertebral artery flow.  External carotid artery stenosis source of bruit. Follow up studies if clinically indicated.  Assessment  09/09/2021: Sinus bradycardia rate of 55 bpm.  Left atrial enlargement.  Normal axis.  Poor R wave pression, cannot exclude anteroseptal infarct old.  Nonspecific T wave abnormality.  Low voltage complexes.  Compared to EKG 01/09/2021, no significant change.  EKG 01/10/2020: Normal sinus rhythm with rate of 64 bpm, leftward enlargement, normal axis.  Poor R wave progression, cannot exclude anteroseptal infarct old.  Nonspecific ST-T abnormality, cannot exclude inferior ischemia.   No significant change from  EKG 01/09/2019 last year   Assessment     ICD-10-CM   1. Atherosclerosis of native coronary artery of native heart with stable angina pectoris ([place])  I25.118 EKG 12-Lead    2. Essential hypertension  I10     3. Hypertriglyceridemia  E78.1          No orders of the defined types were placed in this encounter.   Medications Discontinued During This Encounter  Medication Reason   amoxicillin-clavulanate (AUGMENTIN) 875-125 MG tablet Error   SUMAtriptan (IMITREX) 50 MG tablet Error   topiramate (TOPAMAX) 25 MG capsule Error     Recommendations:   [name]  is a 75 y.o. Caucasian male patient with coronary artery disease and has undergone PCI to the RCA on 07/23/2005 with implantation of a 2.5 x 18 Cypher drug-eluting stent.  He has moderate disease in his proximal LAD.  He also has history of hypertension, hyperlipidemia and diabetes mellitus and obstructive sleep apnea on CPAP and compliant and follows Dr. Rexene Alberts.  He has history of spinal stenosis S/P Laminectomy 03/20/2016 and chronic back  pain.  Patient presents for 40-monthfollow-up of CAD, hypertension, hyperlipidemia.  On last visit had recommended stress test given dyspnea on exertion, however patient opted to hold off.  Again given history of coronary artery disease and atypical chest discomfort and dyspnea symptoms discussed risks, benefits, and alternatives of further ischemic evaluation.  Patient verbalized understanding of the benefits but wishes to hold off on stress test at this time, he recognizes this may prevent or delay diagnosis of CAD progression.  I personally reviewed external records, triglycerides remain uncontrolled.  Discussed initiation of Vascepa, however due to the fact that it is cost prohibitive patient wishes to first focus on diet and lifestyle modifications.  Encourage patient to reduce carb and sugar intake as well as focus on weight loss.  Patient verbalized understanding agreement.  Patient's blood pressure is well controlled.  Reviewed and discussed results of carotid artery duplex, no significant stenosis.  Notably unable to take thiazide and thiazide like diuretics due to parathyroid disorder and hypercalcemia. Developed swelling with amlodipine.   Patient is otherwise stable from a cardiovascular standpoint.  Follow-up in 6 months, sooner if needed, for CAD, hypertension, hyperlipidemia.   CAlethia Berthold PA-C 09/09/2021, 2:10 PM Office: [phone]

## 2021-09-10 DIAGNOSIS — Z23 Encounter for immunization: Secondary | ICD-10-CM | POA: Diagnosis not present

## 2021-09-16 DIAGNOSIS — R82994 Hypercalciuria: Secondary | ICD-10-CM | POA: Diagnosis not present

## 2021-09-17 ENCOUNTER — Other Ambulatory Visit (HOSPITAL_COMMUNITY): Payer: Self-pay | Admitting: Surgery

## 2021-09-17 ENCOUNTER — Other Ambulatory Visit: Payer: Self-pay | Admitting: Surgery

## 2021-09-23 DIAGNOSIS — I152 Hypertension secondary to endocrine disorders: Secondary | ICD-10-CM | POA: Diagnosis not present

## 2021-09-23 DIAGNOSIS — E1159 Type 2 diabetes mellitus with other circulatory complications: Secondary | ICD-10-CM | POA: Diagnosis not present

## 2021-09-23 DIAGNOSIS — N644 Mastodynia: Secondary | ICD-10-CM | POA: Diagnosis not present

## 2021-09-23 DIAGNOSIS — E785 Hyperlipidemia, unspecified: Secondary | ICD-10-CM | POA: Diagnosis not present

## 2021-09-23 DIAGNOSIS — E119 Type 2 diabetes mellitus without complications: Secondary | ICD-10-CM | POA: Diagnosis not present

## 2021-09-23 DIAGNOSIS — L819 Disorder of pigmentation, unspecified: Secondary | ICD-10-CM | POA: Diagnosis not present

## 2021-09-23 DIAGNOSIS — E1169 Type 2 diabetes mellitus with other specified complication: Secondary | ICD-10-CM | POA: Diagnosis not present

## 2021-10-01 ENCOUNTER — Other Ambulatory Visit: Payer: Self-pay

## 2021-10-01 ENCOUNTER — Ambulatory Visit (INDEPENDENT_AMBULATORY_CARE_PROVIDER_SITE_OTHER): Payer: Medicare Other | Admitting: Dermatology

## 2021-10-01 DIAGNOSIS — I25118 Atherosclerotic heart disease of native coronary artery with other forms of angina pectoris: Secondary | ICD-10-CM | POA: Diagnosis not present

## 2021-10-01 DIAGNOSIS — D1800 Hemangioma unspecified site: Secondary | ICD-10-CM

## 2021-10-01 DIAGNOSIS — Z1283 Encounter for screening for malignant neoplasm of skin: Secondary | ICD-10-CM

## 2021-10-01 DIAGNOSIS — Z85828 Personal history of other malignant neoplasm of skin: Secondary | ICD-10-CM | POA: Diagnosis not present

## 2021-10-01 DIAGNOSIS — L821 Other seborrheic keratosis: Secondary | ICD-10-CM

## 2021-10-03 DIAGNOSIS — M79601 Pain in right arm: Secondary | ICD-10-CM | POA: Diagnosis not present

## 2021-10-03 DIAGNOSIS — M79602 Pain in left arm: Secondary | ICD-10-CM | POA: Diagnosis not present

## 2021-10-07 ENCOUNTER — Encounter (HOSPITAL_COMMUNITY)
Admission: RE | Admit: 2021-10-07 | Discharge: 2021-10-07 | Disposition: A | Discharge status: Home / Self Care | Payer: Medicare Other | Source: Ambulatory Visit | Attending: Surgery | Admitting: Surgery

## 2021-10-07 ENCOUNTER — Ambulatory Visit (HOSPITAL_COMMUNITY)
Admission: RE | Admit: 2021-10-07 | Discharge: 2021-10-07 | Disposition: A | Discharge status: Home / Self Care | Payer: Medicare Other | Source: Ambulatory Visit | Attending: Surgery | Admitting: Surgery

## 2021-10-07 ENCOUNTER — Other Ambulatory Visit: Payer: Self-pay

## 2021-10-07 ENCOUNTER — Ambulatory Visit (HOSPITAL_COMMUNITY): Payer: Medicare Other

## 2021-10-07 DIAGNOSIS — J9859 Other diseases of mediastinum, not elsewhere classified: Secondary | ICD-10-CM | POA: Diagnosis not present

## 2021-10-07 DIAGNOSIS — E041 Nontoxic single thyroid nodule: Secondary | ICD-10-CM | POA: Diagnosis not present

## 2021-10-07 MED ORDER — TECHNETIUM TC 99M SESTAMIBI GENERIC - CARDIOLITE
21.0000 | Freq: Once | INTRAVENOUS | Status: AC | PRN
  Administered 2021-10-07: 21 via INTRAVENOUS

## 2021-10-10 NOTE — Progress Notes (Signed)
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  Would like to proceed with 4D-CT scan of neck and upper chest to evaluate for possible ectopic parathyroid adenoma.  Claiborne Billings - please convey info to radiology and schedule.  [place] practice Office: [phone]

## 2021-10-10 NOTE — Progress Notes (Signed)
USN without significant abnormality, no sign of parathyroid adenoma.  Armandina Gemma, MD [facility] [place] practice Office: [phone]

## 2021-10-16 DIAGNOSIS — K623 Rectal prolapse: Secondary | ICD-10-CM | POA: Diagnosis not present

## 2021-10-17 DIAGNOSIS — N644 Mastodynia: Secondary | ICD-10-CM | POA: Diagnosis not present

## 2021-10-17 DIAGNOSIS — N62 Hypertrophy of breast: Secondary | ICD-10-CM | POA: Diagnosis not present

## 2021-10-22 ENCOUNTER — Other Ambulatory Visit: Payer: Self-pay | Admitting: Surgery

## 2021-10-31 ENCOUNTER — Encounter: Payer: Self-pay | Admitting: Dermatology

## 2021-10-31 NOTE — Progress Notes (Signed)
° °  Follow-Up Visit   Subjective  [name] is a 75 y.o. male who presents for the following: Follow-up (Follow up on scalp previous SCC. /Patient also has a discoloration on left nipple x years. But it hurts when pressed. ).  Check site of skin cancer new spot left nipple. Location:  Duration:  Quality:  Associated Signs/Symptoms: Modifying Factors:  Severity:  Timing: Context:   Objective  Well appearing patient in no apparent distress; mood and affect are within normal limits. Torso - Posterior (Back) Waist up skin examination: No atypical pigmented lesion, no new or recurrent nonmelanoma skin cancer  Left Areola (5), Left Lower Back Textured brown flattopped 6 mm papules, dermoscopy compatible  Right Abdomen (side) - Upper 3 mm smooth red dermal papule    All skin waist up examined.   Assessment & Plan    Encounter for screening for malignant neoplasm of skin Torso - Posterior (Back)  Annual skin examination.  Seborrheic keratosis (6) Left Areola (5); Left Lower Back  Leave if stable.  Hemangioma, unspecified site Right Abdomen (side) - Upper  No intervention necessary      I, [name], MD, have reviewed all documentation for this visit.  The documentation on 10/31/21 for the exam, diagnosis, procedures, and orders are all accurate and complete.

## 2021-11-14 ENCOUNTER — Other Ambulatory Visit: Payer: Self-pay

## 2021-11-14 ENCOUNTER — Ambulatory Visit
Admission: RE | Admit: 2021-11-14 | Discharge: 2021-11-14 | Disposition: A | Discharge status: Home / Self Care | Payer: Medicare Other | Source: Ambulatory Visit | Attending: Surgery | Admitting: Surgery

## 2021-11-14 MED ORDER — IOPAMIDOL (ISOVUE-370) INJECTION 76%
75.0000 mL | Freq: Once | INTRAVENOUS | Status: AC | PRN
  Administered 2021-11-14: 75 mL via INTRAVENOUS

## 2021-11-16 HISTORY — PX: PARATHYROIDECTOMY: SHX19

## 2021-11-25 NOTE — Progress Notes (Signed)
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  Amenable to minimally invasive out-patient surgery.  Telephone call to patient and [facility].  Will send to schedulers.  Doyle, MD [facility] [place] practice Office: [phone]

## 2021-12-12 ENCOUNTER — Ambulatory Visit: Payer: Self-pay | Admitting: Surgery

## 2021-12-12 NOTE — H&P (View-Only) (Signed)
REFERRING PHYSICIAN: Gerome Apley, MD  PROVIDER: Darrio Bade Charlotta Newton, MD  Chief Complaint: New Consultation (Hypercalcemia, suspect primary hyperparathyroidism)   History of Present Illness:  Patient is referred by Dr. Francetta Found for surgical evaluation of hypercalcemia and hypercalciuria and suspected primary hyperparathyroidism. Patient's primary care provider is Tereasa Coop, [place]. Patient's cardiologist is Dr. Adrian Prows. Patient has approximately a 5-year history of hypercalcemia. His primary care provider has discontinued medications that may be causing the elevation. Unfortunately he has had a persistently elevated calcium level with his most recent study showing an ionized calcium of 5.8. Intact PTH level was in the normal range at 35 but not suppressed as would be expected. 25 hydroxy vitamin D level was normal at 37.5. 24-hour urine collection for calcium was markedly elevated at 596. Patient has been symptomatic with fatigue, bone and joint pain, memory issues, and nephrolithiasis. Patient has had no prior head or neck surgery. There is no family history of parathyroid disease or other endocrine neoplasms. Patient presents today to discuss further evaluation for possible primary hyperparathyroidism.  Review of Systems: A complete review of systems was obtained from the patient. I have reviewed this information and discussed as appropriate with the patient. See HPI as well for other ROS.  Review of Systems  Constitutional: Positive for malaise/fatigue.  HENT: Negative.  Eyes: Negative.  Respiratory: Negative.  Cardiovascular: Negative.  Gastrointestinal: Negative.  Genitourinary: Negative.  Musculoskeletal: Positive for joint pain.  Skin: Negative.  Neurological: Negative.  Endo/Heme/Allergies: Negative.  Psychiatric/Behavioral: Positive for memory loss.    Medical History: Past Medical History:  Diagnosis Date   Arthritis   Chronic kidney disease   Diabetes  mellitus without complication (CMS-HCC)   Glaucoma (increased eye pressure)   History of cancer   Hyperlipidemia   Hypertension   Sleep apnea   Patient Active Problem List  Diagnosis   Hypercalcemia   Hypercalciuria   History reviewed. No pertinent surgical history.   Allergies  Allergen Reactions   Banana Anaphylaxis and Other (See Comments)   Fluticasone Propionate Other (See Comments)  nosebleed Other reaction(s): Other nosebleed   Benzalkonium Other (See Comments)  Nose Bleeds   Bisacodyl Abdominal Pain  Causes headaches Causes headaches   Chlorthalidone Other (See Comments)  High Glucose levels Other reaction(s): Other High Glucose levels   Docusate Sodium Other (See Comments)  Causes migraines Causes migraines   Glimepiride Other (See Comments)  Headaches  Gives pt migraines   Hydrocodone Rash  Ineffective    Hydrocodone-Acetaminophen Other (See Comments)  Ineffective Ineffective   Metaxalone Dermatitis, Other (See Comments) and Rash  Cherry red rash with sloughing off of skin at left upper chest/shoulder   Amlodipine Besylate Other (See Comments) and Swelling   Levofloxacin Nausea   Lisinopril Cough   Current Outpatient Medications on File Prior to Visit  Medication Sig Dispense Refill   blood glucose diagnostic (FREESTYLE LITE STRIPS) test strip 1 each by Other route as needed.   cetirizine (ZYRTEC) 10 mg capsule Take by mouth at bedtime as needed   cholecalciferol (VITAMIN D3) 1000 unit tablet Take by mouth   empagliflozin (JARDIANCE) 25 mg tablet Take by mouth   losartan (COZAAR) 50 MG tablet Take by mouth   metoprolol tartrate (LOPRESSOR) 25 MG tablet Take 25 mg by mouth 2 (two) times daily   nitroGLYcerin (NITROSTAT) 0.4 MG SL tablet Place under the tongue   SITagliptin (JANUVIA) 100 MG tablet Take by mouth   SUMAtriptan (IMITREX) 50  tablet Take by mouth every 2 (two) hours as needed  ° acetaminophen (TYLENOL) 650 MG ER tablet Take by  mouth  ° amoxicillin-clavulanate (AUGMENTIN) 875-125 mg tablet Take 1 tablet by mouth 2 (two) times daily  ° aspirin 81 MG EC tablet Take 81 mg by mouth once daily  ° azithromycin (ZITHROMAX) 250 MG tablet  ° baclofen (LIORESAL) 10 MG tablet  ° bimatoprost (LUMIGAN) 0.01 % ophthalmic solution Lumigan 0.01 % eye drops  ° BINAXNOW COVID-19 AG SELF TEST Kit TEST AS DIRECTED TODAY  ° brimonidine-timoloL (COMBIGAN) 0.2-0.5 % ophthalmic solution every 12 (twelve) hours  ° brinzolamide (AZOPT) 1 % ophthalmic suspension Place 1 drop into both eyes 2 (two) times daily  ° cloNIDine HCL (CATAPRES) 0.1 MG tablet  ° cyanocobalamin (VITAMIN B12) 1000 MCG tablet Take by mouth  ° docosahexaenoic acid-epa 120-180 mg Cap Take by mouth  ° doxycycline (VIBRA-TABS) 100 MG tablet Take 100 mg by mouth 2 (two) times daily  ° FREESTYLE LANCETS lancets CHECK BLOOD SUGAR ONCE DAILY  ° gabapentin (NEURONTIN) 100 MG capsule  ° hydrALAZINE (APRESOLINE) 25 MG tablet  ° mupirocin (BACTROBAN) 2 % ointment APPLY TOPICALLY TO THE AFFECTED AREA THREE TIMES DAILY  ° RHOPRESSA 0.02 % eye drops INSTILL 1 DROP IN BOTH EYES AT BEDTIME  ° rosuvastatin (CRESTOR) 20 MG tablet  ° spironolactone (ALDACTONE) 25 MG tablet Take 25 mg by mouth once daily  ° topiramate (TOPAMAX) 25 MG tablet  ° triamcinolone 0.1 % cream APPLY TOPICALLY TO THE AFFECTED AREA THREE TIMES DAILY  ° °No current facility-administered medications on file prior to visit.  ° °History reviewed. No pertinent family history.  ° °Social History  ° °Tobacco Use  °Smoking Status Never Smoker  °Smokeless Tobacco Never Used  ° ° °Social History  ° °Socioeconomic History  ° Marital status: Married  °Tobacco Use  ° Smoking status: Never Smoker  ° Smokeless tobacco: Never Used  °Substance and Sexual Activity  ° Alcohol use: Yes  ° Drug use: Yes  °Comment: CBD oil  ° °Objective:  ° °Vitals:  °BP: 120/72  °Pulse: 72  °Temp: 36.8 °C (98.2 °F)  °SpO2: 98%  °Weight: 91.3 kg (201 lb 3.2 oz)  °Height: 182.9 cm  (6')  ° °Body mass index is 27.29 kg/m². ° °Physical Exam  ° °GENERAL APPEARANCE °Development: normal °Nutritional status: normal °Gross deformities: none ° °SKIN °Rash, lesions, ulcers: none °Induration, erythema: none °Nodules: none palpable ° °EYES °Conjunctiva and lids: normal °Pupils: equal and reactive °Iris: normal bilaterally ° °EARS, NOSE, MOUTH, THROAT °External ears: no lesion or deformity °External nose: no lesion or deformity °Hearing: grossly normal °Due to Covid-19 pandemic, patient is wearing a mask. ° °NECK °Symmetric: yes °Trachea: midline °Thyroid: no palpable nodules in the thyroid bed ° °CHEST °Respiratory effort: normal °Retraction or accessory muscle use: no °Breath sounds: normal bilaterally °Rales, rhonchi, wheeze: none ° °CARDIOVASCULAR °Auscultation: regular rhythm, normal rate °Murmurs: none °Pulses: radial pulse 2+ palpable °Lower extremity edema: Mild bilateral ° °MUSCULOSKELETAL °Station and gait: normal °Digits and nails: no clubbing or cyanosis °Muscle strength: grossly normal all extremities °Range of motion: grossly normal all extremities °Deformity: none ° °LYMPHATIC °Cervical: none palpable °Supraclavicular: none palpable ° °PSYCHIATRIC °Oriented to person, place, and time: yes °Mood and affect: normal for situation °Judgment and insight: appropriate for situation ° °Assessment and Plan:  ° °Hypercalcemia ° °Hypercalciuria ° ° °Patient is referred by his endocrinologist, Dr. Matthew Levy, for surgical evaluation of hypercalcemia and hypercalciuria for suspected primary   primary hyperparathyroidism.  Patient provided with a copy of "Parathyroid Surgery: Treatment for Your Parathyroid Gland Problem", published by [employer], 12 pages. Book reviewed and explained to patient during visit today.  Patient has a mildly elevated ionized calcium level with an unsuppressed PTH level. Urinary calcium excretion however is markedly elevated. Patient is symptomatic. I have recommended proceeding with  imaging studies to include a nuclear medicine parathyroid scan and an ultrasound examination of the neck. We will coordinate the studies in the near future. If they indicate the presence of a parathyroid adenoma, then I believe the patient would be a good candidate for minimally invasive outpatient surgery. If the studies failed to identify an adenoma, then we will proceed with a 4D CT scan of the neck in hopes of localizing the adenoma and confirming his diagnosis. The patient understands this plan and agrees to proceed. We will contact him with his results when they are available.  Armandina Gemma, MD [facility] [place] practice Office: [phone]

## 2021-12-12 NOTE — H&P (Signed)
REFERRING PHYSICIAN: Gerome Apley, MD  PROVIDER: Arisa Congleton Charlotta Newton, MD  Chief Complaint: New Consultation (Hypercalcemia, suspect primary hyperparathyroidism)   History of Present Illness:  Patient is referred by Dr. Francetta Found for surgical evaluation of hypercalcemia and hypercalciuria and suspected primary hyperparathyroidism. Patient's primary care provider is Tereasa Coop, [place]. Patient's cardiologist is Dr. Adrian Prows. Patient has approximately a 5-year history of hypercalcemia. His primary care provider has discontinued medications that may be causing the elevation. Unfortunately he has had a persistently elevated calcium level with his most recent study showing an ionized calcium of 5.8. Intact PTH level was in the normal range at 35 but not suppressed as would be expected. 25 hydroxy vitamin D level was normal at 37.5. 24-hour urine collection for calcium was markedly elevated at 596. Patient has been symptomatic with fatigue, bone and joint pain, memory issues, and nephrolithiasis. Patient has had no prior head or neck surgery. There is no family history of parathyroid disease or other endocrine neoplasms. Patient presents today to discuss further evaluation for possible primary hyperparathyroidism.  Review of Systems: A complete review of systems was obtained from the patient. I have reviewed this information and discussed as appropriate with the patient. See HPI as well for other ROS.  Review of Systems  Constitutional: Positive for malaise/fatigue.  HENT: Negative.  Eyes: Negative.  Respiratory: Negative.  Cardiovascular: Negative.  Gastrointestinal: Negative.  Genitourinary: Negative.  Musculoskeletal: Positive for joint pain.  Skin: Negative.  Neurological: Negative.  Endo/Heme/Allergies: Negative.  Psychiatric/Behavioral: Positive for memory loss.    Medical History: Past Medical History:  Diagnosis Date   Arthritis   Chronic kidney disease   Diabetes  mellitus without complication (CMS-HCC)   Glaucoma (increased eye pressure)   History of cancer   Hyperlipidemia   Hypertension   Sleep apnea   Patient Active Problem List  Diagnosis   Hypercalcemia   Hypercalciuria   History reviewed. No pertinent surgical history.   Allergies  Allergen Reactions   Banana Anaphylaxis and Other (See Comments)   Fluticasone Propionate Other (See Comments)  nosebleed Other reaction(s): Other nosebleed   Benzalkonium Other (See Comments)  Nose Bleeds   Bisacodyl Abdominal Pain  Causes headaches Causes headaches   Chlorthalidone Other (See Comments)  High Glucose levels Other reaction(s): Other High Glucose levels   Docusate Sodium Other (See Comments)  Causes migraines Causes migraines   Glimepiride Other (See Comments)  Headaches  Gives pt migraines   Hydrocodone Rash  Ineffective    Hydrocodone-Acetaminophen Other (See Comments)  Ineffective Ineffective   Metaxalone Dermatitis, Other (See Comments) and Rash  Cherry red rash with sloughing off of skin at left upper chest/shoulder   Amlodipine Besylate Other (See Comments) and Swelling   Levofloxacin Nausea   Lisinopril Cough   Current Outpatient Medications on File Prior to Visit  Medication Sig Dispense Refill   blood glucose diagnostic (FREESTYLE LITE STRIPS) test strip 1 each by Other route as needed.   cetirizine (ZYRTEC) 10 mg capsule Take by mouth at bedtime as needed   cholecalciferol (VITAMIN D3) 1000 unit tablet Take by mouth   empagliflozin (JARDIANCE) 25 mg tablet Take by mouth   losartan (COZAAR) 50 MG tablet Take by mouth   metoprolol tartrate (LOPRESSOR) 25 MG tablet Take 25 mg by mouth 2 (two) times daily   nitroGLYcerin (NITROSTAT) 0.4 MG SL tablet Place under the tongue   SITagliptin (JANUVIA) 100 MG tablet Take by mouth   SUMAtriptan (IMITREX) 50  MG tablet Take by mouth every 2 (two) hours as needed   acetaminophen (TYLENOL) 650 MG ER tablet Take by  mouth   amoxicillin-clavulanate (AUGMENTIN) 875-125 mg tablet Take 1 tablet by mouth 2 (two) times daily   aspirin 81 MG EC tablet Take 81 mg by mouth once daily   azithromycin (ZITHROMAX) 250 MG tablet   baclofen (LIORESAL) 10 MG tablet   bimatoprost (LUMIGAN) 0.01 % ophthalmic solution Lumigan 0.01 % eye drops   BINAXNOW COVID-19 AG SELF TEST Kit TEST AS DIRECTED TODAY   brimonidine-timoloL (COMBIGAN) 0.2-0.5 % ophthalmic solution every 12 (twelve) hours   brinzolamide (AZOPT) 1 % ophthalmic suspension Place 1 drop into both eyes 2 (two) times daily   cloNIDine HCL (CATAPRES) 0.1 MG tablet   cyanocobalamin (VITAMIN B12) 1000 MCG tablet Take by mouth   docosahexaenoic acid-epa 120-180 mg Cap Take by mouth   doxycycline (VIBRA-TABS) 100 MG tablet Take 100 mg by mouth 2 (two) times daily   FREESTYLE LANCETS lancets CHECK BLOOD SUGAR ONCE DAILY   gabapentin (NEURONTIN) 100 MG capsule   hydrALAZINE (APRESOLINE) 25 MG tablet   mupirocin (BACTROBAN) 2 % ointment APPLY TOPICALLY TO THE AFFECTED AREA THREE TIMES DAILY   RHOPRESSA 0.02 % eye drops INSTILL 1 DROP IN BOTH EYES AT BEDTIME   rosuvastatin (CRESTOR) 20 MG tablet   spironolactone (ALDACTONE) 25 MG tablet Take 25 mg by mouth once daily   topiramate (TOPAMAX) 25 MG tablet   triamcinolone 0.1 % cream APPLY TOPICALLY TO THE AFFECTED AREA THREE TIMES DAILY   No current facility-administered medications on file prior to visit.   History reviewed. No pertinent family history.   Social History   Tobacco Use  Smoking Status Never Smoker  Smokeless Tobacco Never Used    Social History   Socioeconomic History   Marital status: Married  Tobacco Use   Smoking status: Never Smoker   Smokeless tobacco: Never Used  Substance and Sexual Activity   Alcohol use: Yes   Drug use: Yes  Comment: CBD oil   Objective:   Vitals:  BP: 120/72  Pulse: 72  Temp: 36.8 C (98.2 F)  SpO2: 98%  Weight: 91.3 kg (201 lb 3.2 oz)  Height: 182.9 cm  (6')   Body mass index is 27.29 kg/m.  Physical Exam   GENERAL APPEARANCE Development: normal Nutritional status: normal Gross deformities: none  SKIN Rash, lesions, ulcers: none Induration, erythema: none Nodules: none palpable  EYES Conjunctiva and lids: normal Pupils: equal and reactive Iris: normal bilaterally  EARS, NOSE, MOUTH, THROAT External ears: no lesion or deformity External nose: no lesion or deformity Hearing: grossly normal Due to Covid-19 pandemic, patient is wearing a mask.  NECK Symmetric: yes Trachea: midline Thyroid: no palpable nodules in the thyroid bed  CHEST Respiratory effort: normal Retraction or accessory muscle use: no Breath sounds: normal bilaterally Rales, rhonchi, wheeze: none  CARDIOVASCULAR Auscultation: regular rhythm, normal rate Murmurs: none Pulses: radial pulse 2+ palpable Lower extremity edema: Mild bilateral  MUSCULOSKELETAL Station and gait: normal Digits and nails: no clubbing or cyanosis Muscle strength: grossly normal all extremities Range of motion: grossly normal all extremities Deformity: none  LYMPHATIC Cervical: none palpable Supraclavicular: none palpable  PSYCHIATRIC Oriented to person, place, and time: yes Mood and affect: normal for situation Judgment and insight: appropriate for situation  Assessment and Plan:   Hypercalcemia  Hypercalciuria   Patient is referred by his endocrinologist, Dr. Francetta Found, for surgical evaluation of hypercalcemia and hypercalciuria for suspected  primary hyperparathyroidism.  Patient provided with a copy of "Parathyroid Surgery: Treatment for Your Parathyroid Gland Problem", published by [employer], 12 pages. Book reviewed and explained to patient during visit today.  Patient has a mildly elevated ionized calcium level with an unsuppressed PTH level. Urinary calcium excretion however is markedly elevated. Patient is symptomatic. I have recommended proceeding with  imaging studies to include a nuclear medicine parathyroid scan and an ultrasound examination of the neck. We will coordinate the studies in the near future. If they indicate the presence of a parathyroid adenoma, then I believe the patient would be a good candidate for minimally invasive outpatient surgery. If the studies failed to identify an adenoma, then we will proceed with a 4D CT scan of the neck in hopes of localizing the adenoma and confirming his diagnosis. The patient understands this plan and agrees to proceed. We will contact him with his results when they are available.  Armandina Gemma, MD [facility] [place] practice Office: [phone]

## 2021-12-12 NOTE — Progress Notes (Signed)
Sent message, via epic in basket, requesting orders in epic from surgeon.  

## 2021-12-16 NOTE — Patient Instructions (Addendum)
DUE TO COVID-19 ONLY ONE VISITOR IS ALLOWED TO COME WITH YOU AND STAY IN THE WAITING ROOM ONLY DURING PRE OP AND PROCEDURE.   **NO VISITORS ARE ALLOWED IN THE SHORT STAY AREA OR RECOVERY ROOM!!**   You are not required to quarantine, however you are required to wear a well-fitted mask when you are out and around people not in your household.  Hand Hygiene often Do NOT share personal items Notify your provider if you are in close contact with someone who has COVID or you develop fever 100.4 or greater, new onset of sneezing, cough, sore throat, shortness of breath or body aches.   Your procedure is scheduled on:  Monday 12-22-21   Report to [facility] Main  Entrance     Report to admitting at 7:15 AM   Call this number if you have problems the morning of surgery [phone]   Do not eat food :After Midnight.   May have liquids until 6:30 AM  day of surgery  CLEAR LIQUID DIET  Foods Allowed                                                                     Foods Excluded  Water, Black Coffee (no milk/no creamer) and tea, regular and decaf                              liquids that you cannot  Plain Jell-O in any flavor  (No red)                         see through such as: Fruit ices (not with fruit pulp)                                 milk, soups, orange juice  Iced Popsicles (No red)                                    All solid food                             Apple juices Sports drinks like Gatorade (No red) Lightly seasoned clear broth or consume(fat free) Sugar Oral Hygiene is also important to reduce your risk of infection.                                    Remember - BRUSH YOUR TEETH THE MORNING OF SURGERY WITH YOUR REGULAR TOOTHPASTE   Do NOT smoke after Midnight   Take these medicines the morning of surgery with A SIP OF WATER:  Tylenol, Zyrtec, Clonidine, Metoprolol, Rosuvastatin  How to Manage Your Diabetes Before and After Surgery  Why is it important  to control my blood sugar before and after surgery? Improving blood sugar levels before and after surgery helps healing and can limit problems. A way of improving blood sugar control is eating a healthy diet by:  Eating less sugar and carbohydrates  Increasing activity/exercise  Talking with your doctor about reaching your blood sugar goals High blood sugars (greater than 180 mg/dL) can raise your risk of infections and slow your recovery, so you will need to focus on controlling your diabetes during the weeks before surgery. Make sure that the doctor who takes care of your diabetes knows about your planned surgery including the date and location.  How do I manage my blood sugar before surgery? Check your blood sugar at least 4 times a day, starting 2 days before surgery, to make sure that the level is not too high or low. Check your blood sugar the morning of your surgery when you wake up and every 2 hours until you get to the Short Stay unit. If your blood sugar is less than 70 mg/dL, you will need to treat for low blood sugar: Do not take insulin. Treat a low blood sugar (less than 70 mg/dL) with  cup of clear juice (cranberry or apple), 4 glucose tablets, OR glucose gel. Recheck blood sugar in 15 minutes after treatment (to make sure it is greater than 70 mg/dL). If your blood sugar is not greater than 70 mg/dL on recheck, call [phone] for further instructions. Report your blood sugar to the short stay nurse when you get to Short Stay.  If you are admitted to the hospital after surgery: Your blood sugar will be checked by the staff and you will probably be given insulin after surgery (instead of oral diabetes medicines) to make sure you have good blood sugar levels. The goal for blood sugar control after surgery is 80-180 mg/dL.   WHAT DO I DO ABOUT MY DIABETES MEDICATION?  Do not take oral diabetes medicines (pills) the morning of surgery.  THE DAY BEFORE SURGERY:  Do not take  Jardiance              Take Januvia as prescribed.   THE MORNING OF SURGERY: Do not take Jardiance or Januvia.  Reviewed and Endorsed by [facility] Patient Education Committee, August 2015    Stop all vitamins and herbal supplements a week before surgery   Bring CPAP mask and tubing day of surgery             You may not have any metal on your body including  jewelry, and body piercing             Do not wear  lotions, powders, cologne, or deodorant              Men may shave face and neck.  Do not bring valuables to the hospital. [place].   Patients discharged the day of surgery will not be allowed to drive home.   A responsible adult must remain with you for 24 hours after surgery.  Please read over the following fact sheets you were given: IF YOU HAVE QUESTIONS ABOUT YOUR PRE OP INSTRUCTIONS PLEASE CALL [place] - Preparing for Surgery Before surgery, you can play an important role.  Because skin is not sterile, your skin needs to be as free of germs as possible.  You can reduce the number of germs on your skin by washing with CHG (chlorahexidine gluconate) soap before surgery.  CHG is an antiseptic cleaner which kills germs and bonds with the skin to continue killing germs even after washing. Please DO NOT use if you have an allergy to CHG or  antibacterial soaps.  If your skin becomes reddened/irritated stop using the CHG and inform your nurse when you arrive at Short Stay. Do not shave (including legs and underarms) for at least 48 hours prior to the first CHG shower.  You may shave your face/neck.  Please follow these instructions carefully:  1.  Shower with CHG Soap the night before surgery and the  morning of surgery.  2.  If you choose to wash your hair, wash your hair first as usual with your normal  shampoo.  3.  After you shampoo, rinse your hair and body thoroughly to remove the shampoo.                              4.  Use CHG as you would any other liquid soap.  You can apply chg directly to the skin and wash.  Gently with a scrungie or clean washcloth.  5.  Apply the CHG Soap to your body ONLY FROM THE NECK DOWN.   Do   not use on face/ open                           Wound or open sores. Avoid contact with eyes, ears mouth and   genitals (private parts).                       Wash face,  Genitals (private parts) with your normal soap.             6.  Wash thoroughly, paying special attention to the area where your    surgery  will be performed.  7.  Thoroughly rinse your body with warm water from the neck down.  8.  DO NOT shower/wash with your normal soap after using and rinsing off the CHG Soap.                9.  Pat yourself dry with a clean towel.            10.  Wear clean pajamas.            11.  Place clean sheets on your bed the night of your first shower and do not  sleep with pets. Day of Surgery : Do not apply any lotions/deodorants the morning of surgery.  Please wear clean clothes to the hospital/surgery center.  FAILURE TO FOLLOW THESE INSTRUCTIONS MAY RESULT IN THE CANCELLATION OF YOUR SURGERY  PATIENT SIGNATURE_________________________________  NURSE SIGNATURE__________________________________  ________________________________________________________________________

## 2021-12-16 NOTE — Progress Notes (Addendum)
COVID swab appointment: N/A  COVID Vaccine Completed: Yes x2 Date COVID Vaccine completed: 01-03-20 01-18-20 Has received booster:  Yes x2 08-05-20 COVID vaccine manufacturer: [employer]      Date of COVID positive in last 90 days:  Yes, 11-15-2021, home test  PCP - Armanda Heritage, NP Cardiologist - Adrian Prows, MD  Chest x-ray - N/A EKG - 09-09-21 Epic Stress Test - greater than 2 years Epic ECHO - greater than 2 years Epic Cardiac Cath - greater than 2 years Pacemaker/ICD device last checked: Spinal Cord Stimulator:  Yes  Sleep Study - Yes, +sleep apnea CPAP - Yes  Fasting Blood Sugar - 110 to 120 Checks Blood Sugar - 1 times a day  Blood Thinner Instructions: Aspirin Instructions:  ASA 81.  Patient to check with Dr. Gala Lewandowsky office Last Dose:  Activity level:   Can go up a flight of stairs and perform activities of daily living without stopping and without symptoms of chest pain or shortness of breath. Some limitations due to back and joint pain  Anesthesia review:  R ventricular dilation, CAD with hx of stent placement, HTN, diabetes, OSA with CPAP.  Poor R wave progression on EKG 09-09-21.  Patient states that he is have episodic chest pain that resolves without Nitroglycerin, this is not a new problem.  Patient denies shortness of breath, fever, cough and chest pain at PAT appointment  Patient verbalized understanding of instructions that were given to them at the PAT appointment. Patient was also instructed that they will need to review over the PAT instructions again at home before surgery.

## 2021-12-17 ENCOUNTER — Encounter (HOSPITAL_COMMUNITY): Payer: Self-pay

## 2021-12-17 ENCOUNTER — Encounter (HOSPITAL_COMMUNITY)
Admission: RE | Admit: 2021-12-17 | Discharge: 2021-12-17 | Disposition: A | Discharge status: Home / Self Care | Payer: Medicare Other | Source: Ambulatory Visit | Attending: Surgery | Admitting: Surgery

## 2021-12-17 ENCOUNTER — Other Ambulatory Visit: Payer: Self-pay

## 2021-12-17 VITALS — BP 131/79 | HR 54 | Temp 97.9°F | Resp 18 | Ht 72.0 in | Wt 203.6 lb

## 2021-12-17 DIAGNOSIS — G4733 Obstructive sleep apnea (adult) (pediatric): Secondary | ICD-10-CM | POA: Insufficient documentation

## 2021-12-17 DIAGNOSIS — E785 Hyperlipidemia, unspecified: Secondary | ICD-10-CM | POA: Diagnosis not present

## 2021-12-17 DIAGNOSIS — I2510 Atherosclerotic heart disease of native coronary artery without angina pectoris: Secondary | ICD-10-CM | POA: Insufficient documentation

## 2021-12-17 DIAGNOSIS — Z01812 Encounter for preprocedural laboratory examination: Secondary | ICD-10-CM | POA: Diagnosis present

## 2021-12-17 DIAGNOSIS — E119 Type 2 diabetes mellitus without complications: Secondary | ICD-10-CM | POA: Insufficient documentation

## 2021-12-17 DIAGNOSIS — E210 Primary hyperparathyroidism: Secondary | ICD-10-CM | POA: Insufficient documentation

## 2021-12-17 DIAGNOSIS — H409 Unspecified glaucoma: Secondary | ICD-10-CM | POA: Diagnosis not present

## 2021-12-17 DIAGNOSIS — I10 Essential (primary) hypertension: Secondary | ICD-10-CM | POA: Insufficient documentation

## 2021-12-17 DIAGNOSIS — Z01818 Encounter for other preprocedural examination: Secondary | ICD-10-CM

## 2021-12-17 HISTORY — DX: Polyp of colon: K63.5

## 2021-12-17 HISTORY — DX: Gastro-esophageal reflux disease without esophagitis: K21.9

## 2021-12-17 LAB — BASIC METABOLIC PANEL
Anion gap: 9 (ref 5–15)
BUN: 21 mg/dL (ref 8–23)
CO2: 23 mmol/L (ref 22–32)
Calcium: 10.7 mg/dL — ABNORMAL HIGH (ref 8.9–10.3)
Chloride: 105 mmol/L (ref 98–111)
Creatinine, Ser: 0.86 mg/dL (ref 0.61–1.24)
GFR, Estimated: >60 mL/min (ref >60)
Glucose, Bld: 109 mg/dL — ABNORMAL HIGH (ref 70–99)
Potassium: 4.6 mmol/L (ref 3.5–5.1)
Sodium: 137 mmol/L (ref 135–145)

## 2021-12-17 LAB — CBC
HCT: 47 % (ref 39.0–52.0)
Hemoglobin: 15.9 g/dL (ref 13.0–17.0)
MCH: 33.1 pg (ref 26.0–34.0)
MCHC: 33.8 g/dL (ref 30.0–36.0)
MCV: 97.9 fL (ref 80.0–100.0)
Platelets: 154 10*3/uL (ref 150–400)
RBC: 4.8 MIL/uL (ref 4.22–5.81)
RDW: 12.6 % (ref 11.5–15.5)
WBC: 6 10*3/uL (ref 4.0–10.5)
nRBC: 0 % (ref 0.0–0.2)

## 2021-12-17 LAB — GLUCOSE, CAPILLARY: Glucose-Capillary: 110 mg/dL — ABNORMAL HIGH (ref 70–99)

## 2021-12-18 LAB — HEMOGLOBIN A1C
Hgb A1c MFr Bld: 6.3 % — ABNORMAL HIGH (ref 4.8–5.6)
Mean Plasma Glucose: 134 mg/dL

## 2021-12-18 NOTE — Anesthesia Preprocedure Evaluation (Addendum)
Anesthesia Evaluation  Patient identified by MRN, date of birth, ID band Patient awake    Reviewed: Allergy & Precautions, NPO status , Patient's Chart, lab work & pertinent test results  History of Anesthesia Complications (+) DIFFICULT AIRWAY  Airway Mallampati: II  TM Distance: >3 FB     Dental   Pulmonary sleep apnea ,    breath sounds clear to auscultation       Cardiovascular hypertension, + angina + CAD   Rhythm:Regular Rate:Normal     Neuro/Psych  Headaches,    GI/Hepatic Neg liver ROS, GERD  ,  Endo/Other  diabetes  Renal/GU negative Renal ROS     Musculoskeletal   Abdominal   Peds  Hematology   Anesthesia Other Findings   Reproductive/Obstetrics                            Anesthesia Physical Anesthesia Plan  ASA: 3  Anesthesia Plan: General   Post-op Pain Management:    Induction: Intravenous  PONV Risk Score and Plan: 2 and Ondansetron, Dexamethasone and Midazolam  Airway Management Planned: Oral ETT  Additional Equipment:   Intra-op Plan:   Post-operative Plan: Extubation in OR  Informed Consent: I have reviewed the patients History and Physical, chart, labs and discussed the procedure including the risks, benefits and alternatives for the proposed anesthesia with the patient or authorized representative who has indicated his/her understanding and acceptance.     Dental advisory given  Plan Discussed with: CRNA  Anesthesia Plan Comments: (See APP note by Durel Salts, FNP )       Anesthesia Quick Evaluation

## 2021-12-18 NOTE — Progress Notes (Signed)
Anesthesia Chart Review:   Case: [id number] Date/Time: 12/22/21 0915   Procedure: LEFT INFERIOR PARATHYROIDECTOMY (Left)   Anesthesia type: General   Pre-op diagnosis: PRIMARY HYPERPARATHYROIDISM   [facility]   Surgeons: Armandina Gemma, MD       DISCUSSION: Pt is 76 years old with hx CAD (DES to RCA 2006), HTN, DM, hyperlipidemia, OSA, glaucoma  Anesthesia history includes: Difficult intubation.  - Patient carries a card from surgery in [place] in 2001 that notes patient is a difficult intubation, has an anterior larynx: easy mask ventilation. Attempting to get anesthesia records.  - Anesthesia record in [facility] 03/20/16 (for procedure with MAC) notes "Pt obstucts airway with small amounts of sedation. Jaw thrusts performed throughout case. Nasal airway placed by Dr. Linna Caprice. Small amount of bloody drainage noted. Trumpet not very helpful in maintaining patent airway. Precedex bolus given to help sedate patient. Sats maintained above 90% throughout case."  - Anesthesia record in Epic 04/09/17 for general anesthesia notes:  Ventilation: Mask ventilation without difficulty and Oral airway inserted - appropriate to patient size Laryngoscope Size: Glidescope and 4 Grade View: Grade I Tube type: Oral Tube size: 7.5 mm Number of attempts: 1 Airway Equipment and Method: Rigid stylet and Video-laryngoscopy    VS: BP 131/79    Pulse (!) 54    Temp 36.6 C (Oral)    Resp 18    Ht 6' (1.829 m)    Wt 92.4 kg    SpO2 99%    BMI 27.61 kg/m   PROVIDERS: - PCP is Armanda Heritage, NP - Cardiologist is Adrian Prows, MD. Last office visit 09/09/21 with Lawerance Cruel, PA   LABS: Labs reviewed: Acceptable for surgery. (all labs ordered are listed, but only abnormal results are displayed)  Labs Reviewed  HEMOGLOBIN A1C - Abnormal; Notable for the following components:      Result Value   Hgb A1c MFr Bld 6.3 (*)    All other components within normal limits  BASIC METABOLIC PANEL -  Abnormal; Notable for the following components:   Glucose, Bld 109 (*)    Calcium 10.7 (*)    All other components within normal limits  GLUCOSE, CAPILLARY - Abnormal; Notable for the following components:   Glucose-Capillary 110 (*)    All other components within normal limits  CBC     EKG 09/09/2021: Sinus bradycardia rate of 55 bpm.  Left atrial enlargement.  Normal axis.  Poor R wave pression, cannot exclude anteroseptal infarct old.  Nonspecific T wave abnormality.  Low voltage complexes. Compared to EKG 01/09/2021, no significant change.     CV: Carotid artery duplex 03/11/2021:  Duplex suggests stenosis in the right and left external carotid artery (<50%).  Minimal homogenous plaque present bilateral carotid arteries. No significant ICA stenosis bilateral.  Antegrade right vertebral artery flow. Antegrade left vertebral artery flow.  External carotid artery stenosis source of bruit. Follow up studies if clinically indicated.   Echocardiogram 01/11/2018: Left ventricle cavity is normal in size. Mild concentric hypertrophy of the left ventricle.  Normal global wall motion. Doppler evidence of grade I (impaired) diastolic dysfunction, normal LAP. Calculated EF 58%. Left atrial cavity is mildly dilated. Mild (Grade I) mitral regurgitation. Mild chordal SAM. Mild to moderate tricuspid regurgitation. Estimated pulmonary artery systolic pressure 29  mmHg. Mild pulmonic regurgitation. Compared to prior study in 2015, RV function has improved, PASP has decreased.    Exercise stress test 11/27/13 (in correspondence in media tab 03/24/16):  -  No evidence of ischemia.  - RV was prominent and suggests RVH or dilatation. Normal wall motion and endocardial thickening.  - LV EF 73%.  - Low risk study    Past Medical History:  Diagnosis Date   Arthritis    DDD, scoliosis, stenosis     Atherosclerotic heart disease native coronary artery w/angina pectoris (HCC)    Calcium blood  increased    Cancer (HCC)    skin ca, posterior R ear   (BENIGN)   Colon polyps    Complication of anesthesia    difficulty intubation , anterior larynx, easy mask ventilation   Coronary artery disease    Diabetes mellitus without complication (HCC)    Difficult intubation    Diverticulitis    GERD (gastroesophageal reflux disease)    Glaucoma    Headache(784.0)    migraines   History of kidney stones    Hyperlipemia    Hypertension    Right ventricular dilation, 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   sup & infil-left buccal cheek   Ventricular dilatation    Right    Past Surgical History:  Procedure Laterality Date   [place]  2006   CATARACT EXTRACTION Right    CHOLECYSTECTOMY  1990   COLON SURGERY     bowel resection, for diverticulitis    COLONOSCOPY N/A 06/15/2013   Procedure: COLONOSCOPY;  Surgeon: Beryle Beams, MD;  [facility];  Service: Endoscopy;  Laterality: N/A;   COLONOSCOPY WITH PROPOFOL N/A 07/01/2018   Procedure: COLONOSCOPY WITH PROPOFOL;  Surgeon: Carol Ada, MD;  [facility];  Service: Endoscopy;  Laterality: N/A;   CORONARY ANGIOPLASTY     STENT PLACED   LAMINECTOMY WITH POSTERIOR LATERAL ARTHRODESIS LEVEL 1 N/A 04/09/2017   Procedure: Lumbar TwoThree,Lumbar Three-Four,Lumbar Four-Five Laminectomy with Lumbar Five-Sacral One Posterior lateral fusion;  Surgeon: Eustace Moore, MD;  Location: [facility];  Service:  Neurosurgery;  Laterality: N/A;   LITHOTRIPSY     POLYPECTOMY  07/01/2018   Procedure: POLYPECTOMY;  Surgeon: Carol Ada, MD;  [facility];  Service: Endoscopy;;   SPINAL CORD STIMULATOR INSERTION N/A 03/20/2016   Procedure: LUMBAR SPINAL CORD STIMULATOR INSERTION;  Surgeon: Clydell Hakim, MD;  Location: [facility];  Service: Neurosurgery;  Laterality: N/A;   [place] ?    MEDICATIONS:  acetaminophen (TYLENOL) 650 MG CR tablet   aspirin 81 MG tablet   baclofen (LIORESAL) 10 MG tablet   brimonidine-timolol (COMBIGAN) 0.2-0.5 % ophthalmic solution   cetirizine (ZYRTEC) 10 MG tablet   Chlorpheniramine-DM (CORICIDIN COUGH/COLD) 4-30 MG TABS   Cholecalciferol 25 MCG (1000 UT) tablet   cloNIDine (CATAPRES) 0.1 MG tablet   dorzolamide (TRUSOPT) 2 % ophthalmic solution   empagliflozin (JARDIANCE) 25 MG TABS tablet   FREESTYLE LITE test strip   Lancets (  FREESTYLE) lancets   latanoprost (XALATAN) 0.005 % ophthalmic solution   losartan (COZAAR) 50 MG tablet   metoprolol tartrate (LOPRESSOR) 25 MG tablet   Multiple Vitamins-Minerals (PRESERVISION AREDS 2) CAPS   mupirocin ointment (BACTROBAN) 2 %   nitroGLYCERIN (NITROSTAT) 0.4 MG SL tablet   Omega-3 Fatty Acids (FISH OIL) 1200 MG CAPS   OVER THE COUNTER MEDICATION   OVER THE COUNTER MEDICATION   Riboflavin (B-2-400) 400 MG CAPS   rosuvastatin (CRESTOR) 20 MG tablet   sitaGLIPtin (JANUVIA) 100 MG tablet   spironolactone (ALDACTONE) 25 MG tablet   SUMAtriptan (IMITREX) 50 MG tablet   TART CHERRY PO   vitamin B-12 (CYANOCOBALAMIN) 1000 MCG tablet   No current facility-administered medications for this encounter.    If no changes, I anticipate pt can proceed with surgery as scheduled.   Willeen Cass, PhD, FNP-BC [facility] Stay Surgical [facility]/Anesthesiology Phone: [phone] 12/18/2021 9:43 AM

## 2021-12-21 ENCOUNTER — Encounter (HOSPITAL_COMMUNITY): Payer: Self-pay | Admitting: Surgery

## 2021-12-21 DIAGNOSIS — E210 Primary hyperparathyroidism: Secondary | ICD-10-CM | POA: Diagnosis present

## 2021-12-22 ENCOUNTER — Encounter (HOSPITAL_COMMUNITY)
Admission: RE | Disposition: A | Discharge status: Home / Self Care | Payer: Self-pay | Source: Home / Self Care | Attending: Surgery

## 2021-12-22 ENCOUNTER — Other Ambulatory Visit: Payer: Self-pay

## 2021-12-22 ENCOUNTER — Ambulatory Visit (HOSPITAL_COMMUNITY): Payer: Medicare Other | Admitting: Emergency Medicine

## 2021-12-22 ENCOUNTER — Ambulatory Visit (HOSPITAL_COMMUNITY): Payer: Medicare Other | Admitting: Certified Registered Nurse Anesthetist

## 2021-12-22 ENCOUNTER — Encounter (HOSPITAL_COMMUNITY): Payer: Self-pay | Admitting: Surgery

## 2021-12-22 ENCOUNTER — Ambulatory Visit (HOSPITAL_COMMUNITY)
Admission: RE | Admit: 2021-12-22 | Discharge: 2021-12-22 | Disposition: A | Discharge status: Home / Self Care | Payer: Medicare Other | Attending: Surgery | Admitting: Surgery

## 2021-12-22 DIAGNOSIS — D351 Benign neoplasm of parathyroid gland: Secondary | ICD-10-CM | POA: Insufficient documentation

## 2021-12-22 DIAGNOSIS — E119 Type 2 diabetes mellitus without complications: Secondary | ICD-10-CM | POA: Insufficient documentation

## 2021-12-22 DIAGNOSIS — E210 Primary hyperparathyroidism: Secondary | ICD-10-CM | POA: Diagnosis present

## 2021-12-22 DIAGNOSIS — R82994 Hypercalciuria: Secondary | ICD-10-CM | POA: Diagnosis not present

## 2021-12-22 DIAGNOSIS — Z87442 Personal history of urinary calculi: Secondary | ICD-10-CM | POA: Insufficient documentation

## 2021-12-22 DIAGNOSIS — G4730 Sleep apnea, unspecified: Secondary | ICD-10-CM | POA: Insufficient documentation

## 2021-12-22 DIAGNOSIS — I10 Essential (primary) hypertension: Secondary | ICD-10-CM | POA: Diagnosis not present

## 2021-12-22 DIAGNOSIS — I2510 Atherosclerotic heart disease of native coronary artery without angina pectoris: Secondary | ICD-10-CM | POA: Diagnosis not present

## 2021-12-22 HISTORY — PX: PARATHYROIDECTOMY: SHX19

## 2021-12-22 LAB — GLUCOSE, CAPILLARY
Glucose-Capillary: 141 mg/dL — ABNORMAL HIGH (ref 70–99)
Glucose-Capillary: 127 mg/dL — ABNORMAL HIGH (ref 70–99)

## 2021-12-22 SURGERY — PARATHYROIDECTOMY
Anesthesia: General | Laterality: Left

## 2021-12-22 MED ORDER — DEXAMETHASONE SODIUM PHOSPHATE 4 MG/ML IJ SOLN
INTRAMUSCULAR | Status: DC | PRN
Start: 2021-12-22 — End: 2021-12-22
  Administered 2021-12-22: 5 mg via INTRAVENOUS

## 2021-12-22 MED ORDER — BUPIVACAINE HCL (PF) 0.5 % IJ SOLN
INTRAMUSCULAR | Status: DC | PRN
  Administered 2021-12-22: 8 mL

## 2021-12-22 MED ORDER — CHLORHEXIDINE GLUCONATE 0.12 % MT SOLN
15.0000 mL | Freq: Once | OROMUCOSAL | Status: AC
  Administered 2021-12-22: 15 mL via OROMUCOSAL

## 2021-12-22 MED ORDER — FENTANYL CITRATE (PF) 100 MCG/2ML IJ SOLN
INTRAMUSCULAR | Status: AC
  Filled 2021-12-22: qty 2

## 2021-12-22 MED ORDER — FENTANYL CITRATE PF 50 MCG/ML IJ SOSY: 25.0000 ug | PREFILLED_SYRINGE | INTRAMUSCULAR | Status: DC | PRN

## 2021-12-22 MED ORDER — DEXAMETHASONE SODIUM PHOSPHATE 10 MG/ML IJ SOLN
INTRAMUSCULAR | Status: AC
  Filled 2021-12-22: qty 1

## 2021-12-22 MED ORDER — MIDAZOLAM HCL 2 MG/2ML IJ SOLN
INTRAMUSCULAR | Status: AC
  Filled 2021-12-22: qty 2

## 2021-12-22 MED ORDER — ONDANSETRON HCL 4 MG/2ML IJ SOLN
INTRAMUSCULAR | Status: DC | PRN
  Administered 2021-12-22: 4 mg via INTRAVENOUS

## 2021-12-22 MED ORDER — LIDOCAINE 2% (20 MG/ML) 5 ML SYRINGE
INTRAMUSCULAR | Status: DC | PRN
  Administered 2021-12-22: 80 mg via INTRAVENOUS

## 2021-12-22 MED ORDER — ROCURONIUM BROMIDE 10 MG/ML (PF) SYRINGE
PREFILLED_SYRINGE | INTRAVENOUS | Status: DC | PRN
  Administered 2021-12-22: 20 mg via INTRAVENOUS
  Administered 2021-12-22: 60 mg via INTRAVENOUS

## 2021-12-22 MED ORDER — BUPIVACAINE HCL (PF) 0.5 % IJ SOLN
INTRAMUSCULAR | Status: AC
  Filled 2021-12-22: qty 30

## 2021-12-22 MED ORDER — SUCCINYLCHOLINE CHLORIDE 200 MG/10ML IV SOSY
PREFILLED_SYRINGE | INTRAVENOUS | Status: DC | PRN
  Administered 2021-12-22: 120 mg via INTRAVENOUS

## 2021-12-22 MED ORDER — ROCURONIUM BROMIDE 10 MG/ML (PF) SYRINGE
PREFILLED_SYRINGE | INTRAVENOUS | Status: AC
  Filled 2021-12-22: qty 10

## 2021-12-22 MED ORDER — LIDOCAINE HCL (PF) 2 % IJ SOLN
INTRAMUSCULAR | Status: AC
  Filled 2021-12-22: qty 5

## 2021-12-22 MED ORDER — TRAMADOL HCL 50 MG PO TABS
50.0000 mg | ORAL_TABLET | Freq: Four times a day (QID) | ORAL | 0 refills | Status: DC | PRN
Start: 2021-12-22 — End: 2022-03-10

## 2021-12-22 MED ORDER — ONDANSETRON HCL 4 MG/2ML IJ SOLN
INTRAMUSCULAR | Status: AC
  Filled 2021-12-22: qty 2

## 2021-12-22 MED ORDER — 0.9 % SODIUM CHLORIDE (POUR BTL) OPTIME
TOPICAL | Status: DC | PRN
  Administered 2021-12-22: 1000 mL

## 2021-12-22 MED ORDER — EPHEDRINE 5 MG/ML INJ
INTRAVENOUS | Status: AC
  Filled 2021-12-22: qty 5

## 2021-12-22 MED ORDER — MIDAZOLAM HCL 5 MG/5ML IJ SOLN
INTRAMUSCULAR | Status: DC | PRN
  Administered 2021-12-22: 2 mg via INTRAVENOUS

## 2021-12-22 MED ORDER — CEFAZOLIN SODIUM-DEXTROSE 2-4 GM/100ML-% IV SOLN
2.0000 g | INTRAVENOUS | Status: AC
  Administered 2021-12-22: 2 g via INTRAVENOUS
  Filled 2021-12-22: qty 100

## 2021-12-22 MED ORDER — LACTATED RINGERS IV SOLN: INTRAVENOUS | Status: DC

## 2021-12-22 MED ORDER — GLYCOPYRROLATE 0.2 MG/ML IJ SOLN
INTRAMUSCULAR | Status: DC | PRN
  Administered 2021-12-22: .2 mg via INTRAVENOUS

## 2021-12-22 MED ORDER — FENTANYL CITRATE (PF) 100 MCG/2ML IJ SOLN
INTRAMUSCULAR | Status: DC | PRN
Start: 2021-12-22 — End: 2021-12-22
  Administered 2021-12-22 (×4): 50 ug via INTRAVENOUS

## 2021-12-22 MED ORDER — EPHEDRINE SULFATE-NACL 50-0.9 MG/10ML-% IV SOSY
PREFILLED_SYRINGE | INTRAVENOUS | Status: DC | PRN
  Administered 2021-12-22 (×4): 5 mg via INTRAVENOUS

## 2021-12-22 MED ORDER — OXYCODONE HCL 5 MG PO TABS
ORAL_TABLET | ORAL | Status: AC
  Administered 2021-12-22: 5 mg via ORAL
  Filled 2021-12-22: qty 1

## 2021-12-22 MED ORDER — GLYCOPYRROLATE 0.2 MG/ML IJ SOLN
INTRAMUSCULAR | Status: AC
  Filled 2021-12-22: qty 1

## 2021-12-22 MED ORDER — SUCCINYLCHOLINE CHLORIDE 200 MG/10ML IV SOSY
PREFILLED_SYRINGE | INTRAVENOUS | Status: AC
  Filled 2021-12-22: qty 10

## 2021-12-22 MED ORDER — CHLORHEXIDINE GLUCONATE CLOTH 2 % EX PADS: 6.0000 | MEDICATED_PAD | Freq: Once | CUTANEOUS | Status: DC

## 2021-12-22 MED ORDER — PROPOFOL 10 MG/ML IV BOLUS
INTRAVENOUS | Status: DC | PRN
  Administered 2021-12-22: 170 mg via INTRAVENOUS

## 2021-12-22 MED ORDER — PROPOFOL 10 MG/ML IV BOLUS
INTRAVENOUS | Status: AC
  Filled 2021-12-22: qty 20

## 2021-12-22 MED ORDER — PHENYLEPHRINE 40 MCG/ML (10ML) SYRINGE FOR IV PUSH (FOR BLOOD PRESSURE SUPPORT)
PREFILLED_SYRINGE | INTRAVENOUS | Status: AC
  Filled 2021-12-22: qty 10

## 2021-12-22 MED ORDER — PHENYLEPHRINE 40 MCG/ML (10ML) SYRINGE FOR IV PUSH (FOR BLOOD PRESSURE SUPPORT)
PREFILLED_SYRINGE | INTRAVENOUS | Status: DC | PRN
  Administered 2021-12-22: 40 ug via INTRAVENOUS

## 2021-12-22 MED ORDER — OXYCODONE HCL 5 MG PO TABS: 5.0000 mg | ORAL_TABLET | Freq: Once | ORAL | Status: AC

## 2021-12-22 MED ORDER — ORAL CARE MOUTH RINSE: 15.0000 mL | Freq: Once | OROMUCOSAL | Status: AC

## 2021-12-22 MED ORDER — SUGAMMADEX SODIUM 200 MG/2ML IV SOLN
INTRAVENOUS | Status: DC | PRN
  Administered 2021-12-22: 300 mg via INTRAVENOUS

## 2021-12-22 MED ORDER — HEMOSTATIC AGENTS (NO CHARGE) OPTIME
TOPICAL | Status: DC | PRN
  Administered 2021-12-22: 1

## 2021-12-22 SURGICAL SUPPLY — 33 items
ATTRACTOMAT 16X20 MAGNETIC DRP (DRAPES) ×2 IMPLANT
BAG COUNTER SPONGE SURGICOUNT (BAG) ×2 IMPLANT
BLADE SURG 15 STRL LF DISP TIS (BLADE) ×1 IMPLANT
BLADE SURG 15 STRL SS (BLADE) ×1
CHLORAPREP W/TINT 26 (MISCELLANEOUS) ×2 IMPLANT
CLIP TI MEDIUM 6 (CLIP) ×4 IMPLANT
CLIP TI WIDE RED SMALL 6 (CLIP) ×4 IMPLANT
COVER SURGICAL LIGHT HANDLE (MISCELLANEOUS) ×2 IMPLANT
DERMABOND ADVANCED (GAUZE/BANDAGES/DRESSINGS) ×1
DERMABOND ADVANCED .7 DNX12 (GAUZE/BANDAGES/DRESSINGS) ×1 IMPLANT
DRAPE LAPAROTOMY T 98X78 PEDS (DRAPES) ×2 IMPLANT
DRAPE UTILITY XL STRL (DRAPES) ×2 IMPLANT
ELECT REM PT RETURN 15FT ADLT (MISCELLANEOUS) ×2 IMPLANT
GAUZE 4X4 16PLY ~~LOC~~+RFID DBL (SPONGE) ×2 IMPLANT
GLOVE SURG SYN 7.5  E (GLOVE) ×4
GLOVE SURG SYN 7.5 E (GLOVE) ×2 IMPLANT
GLOVE SURG SYN 7.5 PF PI (GLOVE) ×2 IMPLANT
GOWN STRL REUS W/TWL XL LVL3 (GOWN DISPOSABLE) ×6 IMPLANT
HEMOSTAT SURGICEL 2X4 FIBR (HEMOSTASIS) ×2 IMPLANT
ILLUMINATOR WAVEGUIDE N/F (MISCELLANEOUS) IMPLANT
KIT BASIN OR (CUSTOM PROCEDURE TRAY) ×2 IMPLANT
KIT TURNOVER KIT A (KITS) IMPLANT
NDL HYPO 25X1 1.5 SAFETY (NEEDLE) ×1 IMPLANT
NEEDLE HYPO 25X1 1.5 SAFETY (NEEDLE) ×2 IMPLANT
PACK BASIC VI WITH GOWN DISP (CUSTOM PROCEDURE TRAY) ×2 IMPLANT
PENCIL SMOKE EVACUATOR (MISCELLANEOUS) ×2 IMPLANT
SUT MNCRL AB 4-0 PS2 18 (SUTURE) ×2 IMPLANT
SUT VIC AB 3-0 SH 18 (SUTURE) ×3 IMPLANT
SYR BULB IRRIG 60ML STRL (SYRINGE) ×2 IMPLANT
SYR CONTROL 10ML LL (SYRINGE) ×2 IMPLANT
TOWEL OR 17X26 10 PK STRL BLUE (TOWEL DISPOSABLE) ×2 IMPLANT
TOWEL OR NON WOVEN STRL DISP B (DISPOSABLE) ×2 IMPLANT
TUBING CONNECTING 10 (TUBING) ×2 IMPLANT

## 2021-12-22 NOTE — Anesthesia Procedure Notes (Signed)
Procedure Name: Intubation Date/Time: 12/22/2021 9:28 AM Performed by: Claudia Desanctis, CRNA Pre-anesthesia Checklist: Patient identified, Emergency Drugs available, Suction available and Patient being monitored Patient Re-evaluated:Patient Re-evaluated prior to induction Oxygen Delivery Method: Circle system utilized Preoxygenation: Pre-oxygenation with 100% oxygen Induction Type: IV induction Ventilation: Mask ventilation without difficulty Laryngoscope Size: Glidescope and 4 Grade View: Grade I Tube type: Oral Tube size: 7.0 mm Number of attempts: 1 Airway Equipment and Method: Stylet Placement Confirmation: ETT inserted through vocal cords under direct vision, positive ETCO2 and breath sounds checked- equal and bilateral Secured at: 22 cm Tube secured with: Tape Dental Injury: Teeth and Oropharynx as per pre-operative assessment

## 2021-12-22 NOTE — Op Note (Signed)
Operative Note  Pre-operative Diagnosis:  primary hyperparathyroidism  Post-operative Diagnosis:  same  Surgeon:  Armandina Gemma, MD  Assistant:  Candida Peeling, MD   Procedure:  Neck exploration with parathyroidectomy  Anesthesia:  general  Estimated Blood Loss:  30 cc  Drains: none         Specimen: parathyroid tissue to pathology for frozen; additional tissue from anterior mediastinum to pathology for permanent evaluation  Indications:  Patient is referred by Dr. Francetta Found for surgical evaluation of hypercalcemia and hypercalciuria and suspected primary hyperparathyroidism. Patient's primary care provider is Tereasa Coop, [place]. Patient's cardiologist is Dr. Adrian Prows. Patient has approximately a 5-year history of hypercalcemia. His primary care provider has discontinued medications that may be causing the elevation. Unfortunately he has had a persistently elevated calcium level with his most recent study showing an ionized calcium of 5.8. Intact PTH level was in the normal range at 35 but not suppressed as would be expected. 25 hydroxy vitamin D level was normal at 37.5. 24-hour urine collection for calcium was markedly elevated at 596. Patient has been symptomatic with fatigue, bone and joint pain, memory issues, and nephrolithiasis. Patient has had no prior head or neck surgery.  Patient underwent nuclear medicine parathyroid scan which localized an inferior adenoma in the midline behind the manubrium.  CT scan demonstrated a mass behind the manubrium corresponding to the signal seen on nuclear scan consistent with parathyroid adenoma.  Patient now comes to surgery for resection.  I was personally present during the key and critical portions of this procedure and immediately available throughout the entire procedure, as documented in my operative note.   Procedure:  The patient was seen in the pre-op holding area. The risks, benefits, complications, treatment options, and expected outcomes  were previously discussed with the patient. The patient agreed with the proposed plan and has signed the informed consent form.  The patient was brought to the operating room by the surgical team, identified as [name] and the procedure verified. A "time out" was completed and the above information confirmed.  Following induction of general anesthesia, the patient was positioned and then prepped and draped in usual aseptic fashion.  After ascertaining that an adequate level of anesthesia been achieved, a low Kocher incision was made with a #15 blade.  Dissection was carried through subcutaneous tissues and platysma.  Skin flaps were elevated cephalad and caudad and self-retaining retractors were placed for exposure.  Strap muscles are incised in the midline.  Dissection was carried down to the thyroid.  Inferior thyroid poles are dissected out bilaterally.  Dissection is then carried caudad in the midline beneath the manubrium.  Exploration is carried out.  No enlarged parathyroid tissue was immediately identified.  Dissection is carried inferiorly along the carotid arteries bilaterally, anterior to the trachea, and posterior to the manubrium.  Dissection is carried down to the subclavian vessels.  Tissue is explored.  There appears to be some thymic remnant.  The posterior aspect of the manubrium is explored but no enlarged parathyroid tissue was identified.  Tissue from zone VI is inspected and a small fragment of what appears to be parathyroid tissue was identified.  This is submitted to pathology where frozen section confirmed parathyroid tissue, but this was likely normal with no sign of hypercellularity.  Further dissection fails to reveal evidence of an adenoma in the anterior mediastinum.  The inferior poles of the thyroid are closely inspected.  There is no sign of a parathyroid adenoma  adjacent to the thyroid.  Decision is made at this point to discontinue further dissection.  Neck is  irrigated with warm saline.  Good hemostasis is achieved throughout the operative field.  Fibrillar was placed throughout the operative field.  Strap muscles are reapproximated in the midline of interrupted 3-0 Vicryl sutures.  Platysma was closed with interrupted 3-0 Vicryl sutures.  Skin was closed with a running 4-0 Monocryl subcuticular suture.  Wound was washed and dried and Dermabond was applied as dressing.  Patient is awakened from anesthesia and transported to the recovery room.  The patient tolerated the procedure well.   Armandina Gemma, [facility] Office: [phone]

## 2021-12-22 NOTE — Transfer of Care (Signed)
Immediate Anesthesia Transfer of Care Note  Patient: [name]  Procedure(s) Performed: LEFT INFERIOR PARATHYROIDECTOMY (Left)  Patient Location: PACU  Anesthesia Type:General  Level of Consciousness: awake and patient cooperative  Airway & Oxygen Therapy: Patient Spontanous Breathing and Patient connected to face mask  Post-op Assessment: Report given to RN and Post -op Vital signs reviewed and stable  Post vital signs: Reviewed and stable  Last Vitals:  Vitals Value Taken Time  BP 141/78 12/22/21 1146  Temp    Pulse 66 12/22/21 1149  Resp 15 12/22/21 1149  SpO2 99 % 12/22/21 1149  Vitals shown include unvalidated device data.  Last Pain:  Vitals:   12/22/21 0759  TempSrc: Oral  PainSc: 1       Patients Stated Pain Goal: 4 (56/31/49 7026)  Complications: No notable events documented.

## 2021-12-22 NOTE — Interval H&P Note (Signed)
History and Physical Interval Note:  12/22/2021 9:09 AM  [name]  has presented today for surgery, with the diagnosis of PRIMARY HYPERPARATHYROIDISM.  The various methods of treatment have been discussed with the patient and family. After consideration of risks, benefits and other options for treatment, the patient has consented to    Procedure(s): LEFT INFERIOR PARATHYROIDECTOMY (Left) as a surgical intervention.    The patient's history has been reviewed, patient examined, no change in status, stable for surgery.  I have reviewed the patient's chart and labs.  Questions were answered to the patient's satisfaction.    Armandina Gemma, [facility] A [place] practice Office: [place]

## 2021-12-22 NOTE — Discharge Instructions (Signed)
[facility] - Dr. Gentry Seeber  THYROID & PARATHYROID SURGERY:  POST-OP INSTRUCTIONS  Always review the instruction sheet provided by the hospital nurse at discharge.  A prescription for pain medication may be sent to your pharmacy at the time of discharge.  Take your pain medication as prescribed.  If narcotic pain medicine is not needed, then you may take acetaminophen (Tylenol) or ibuprofen (Advil) as needed for pain or soreness.  Take your normal home medications as prescribed unless otherwise directed.  If you need a refill on your pain medication, please contact the office during regular business hours.  Prescriptions will not be processed by the office after 5:00PM or on weekends.  Start with a light diet upon arrival home, such as soup and crackers or toast.  Be sure to drink plenty of fluids.  Resume your normal diet the day after surgery.  Most patients will experience some swelling and bruising on the chest and neck area.  Ice packs will help for the first 48 hours after arriving home.  Swelling and bruising will take several days to resolve.   It is common to experience some constipation after surgery.  Increasing fluid intake and taking a stool softener (Colace) will usually help to prevent this problem.  A mild laxative (Milk of Magnesia or Miralax) should be taken according to package directions if there has been no bowel movement after 48 hours.  Dermabond glue covers your incision. This seals the wound and you may shower at any time. The Dermabond will remain in place for about a week.  You may gradually remove the glue when it loosens around the edges.  If you need to loosen the Dermabond for removal, apply a layer of Vaseline to the wound for 15 minutes and then remove with a Kleenex. Your sutures are under the skin and will not show - they will dissolve on their own.  You may resume light daily activities beginning the day after discharge (such as self-care,  walking, climbing stairs), gradually increasing activities as tolerated. You may have sexual intercourse when it is comfortable. Refrain from any heavy lifting or straining until approved by your doctor. You may drive when you no longer are taking prescription pain medication, you can comfortably wear a seatbelt, and you can safely maneuver your car and apply the brakes.  You will see your doctor in the office for a follow-up appointment approximately three weeks after your surgery.  Make sure that you call for this appointment within a day or two after you arrive home to insure a convenient appointment time. Please have any requested laboratory tests performed a few days prior to your office visit so that the results will be available at your follow up appointment.  WHEN TO CALL THE [facility] OFFICE: -- Fever greater than 101.5 -- Inability to urinate -- Nausea and/or vomiting - persistent -- Extreme swelling or bruising -- Continued bleeding from incision -- Increased pain, redness, or drainage from the incision -- Difficulty swallowing or breathing -- Muscle cramping or spasms -- Numbness or tingling in hands or around lips  The clinic staff is available to answer your questions during regular business hours.  Please don't hesitate to call and ask to speak to one of the nurses if you have concerns.  [facility] OFFICE: [phone] (24 hours)  Please sign up for MyChart accounts. This will allow you to communicate directly with my nurse or myself without having to call the office. It will also allow you   to view your test results. You will need to enroll in MyChart for my office ([facility]) and for the hospital ([facility]).  Tony Dacey, MD [facility] A [facility] practice 

## 2021-12-22 NOTE — Progress Notes (Signed)
PACU staff stated that PT has gone home post procedure. No CPAP needed tonight.

## 2021-12-22 NOTE — Interval H&P Note (Signed)
History and Physical Interval Note:  12/22/2021 9:05 AM  [name]  has presented today for surgery, with the diagnosis of PRIMARY HYPERPARATHYROIDISM.  The various methods of treatment have been discussed with the patient and family. After consideration of risks, benefits and other options for treatment, the patient has consented to    Procedure(s): LEFT INFERIOR PARATHYROIDECTOMY (Left) as a surgical intervention.    The patient's history has been reviewed, patient examined, no change in status, stable for surgery.  I have reviewed the patient's chart and labs.  Questions were answered to the patient's satisfaction.    Armandina Gemma, [facility] [place] practice Office: [place]

## 2021-12-23 ENCOUNTER — Encounter (HOSPITAL_COMMUNITY): Payer: Self-pay | Admitting: Surgery

## 2021-12-23 LAB — SURGICAL PATHOLOGY

## 2021-12-23 NOTE — Progress Notes (Signed)
Pathology shows parathyroid tissue consistent with an adenoma.  Hopefully the labs will normalize over the next few weeks.  We'll check a calcium and PTH in about three weeks before his post op appointment.  tmg  Armandina Gemma, [facility] [place] practice Office: [phone]

## 2021-12-23 NOTE — Anesthesia Postprocedure Evaluation (Signed)
Anesthesia Post Note  Patient: [name]  Procedure(s) Performed: LEFT INFERIOR PARATHYROIDECTOMY (Left)     Patient location during evaluation: PACU Anesthesia Type: General Level of consciousness: awake Pain management: pain level controlled Respiratory status: spontaneous breathing Cardiovascular status: stable Postop Assessment: no apparent nausea or vomiting Anesthetic complications: no   No notable events documented.  Last Vitals:  Vitals:   12/22/21 1230 12/22/21 1239  BP: 134/73 (!) 151/82  Pulse: 63 62  Resp: 13 18  Temp: 36.7 C   SpO2: 90% 93%    Last Pain:  Vitals:   12/22/21 1239  TempSrc:   PainSc: 2                  Nely Dedmon

## 2022-01-09 ENCOUNTER — Encounter: Payer: Self-pay | Admitting: Student

## 2022-01-09 NOTE — Progress Notes (Signed)
Patient reports no chest pain or worsening dyspnea since last office visit.

## 2022-03-09 NOTE — Progress Notes (Signed)
? ?Primary Physician/Referring:  Armanda Heritage, NP ? ?Patient ID: [name], male    DOB: 1946/05/19, 76 y.o.   MRN: [id number] ? ?Chief Complaint  ?Patient presents with  ? Coronary Artery Disease  ? HLD  ? Hypertension  ?  6 MONTH  ? ?HPI:   ? ?[name]  is a 76 y.o. Caucasian male patient with coronary artery disease and has undergone PCI to the RCA on 07/23/2005 with implantation of a 2.5 x 18 Cypher drug-eluting stent.  He has moderate disease in his proximal LAD.  He also has history of hypertension, hyperlipidemia and diabetes mellitus and obstructive sleep apnea on CPAP and compliant and follows Dr. Rexene Alberts.  He has history of spinal stenosis S/P Laminectomy 03/20/2016 and chronic back pain. ? ?Patient presents for 31-monthfollow-up.  At last office visit patient opted to hold off on stress testing, triglycerides remain uncontrolled however patient opted to hold off on Vascepa initiation and first try diet and lifestyle modifications.  No changes were made to patient's medications at last office visit.  Since last office visit patient has undergone parathyroidectomy, cataract, and glaucoma surgeries.  He is feeling relatively well overall, however he reports episodes of facial flushing with associated palpitations that occur 2-3 times per week lasting 5 to 20 minutes.  He unfortunately has not checked his blood pressure or heart rate during these episodes.  Denies chest pain.  ? ?Past Medical History:  ?Diagnosis Date  ? Arthritis   ? DDD, scoliosis, stenosis    ? Atherosclerotic heart disease native coronary artery w[place]angina pectoris [place]   ? Calcium blood increased   ? Cancer ([facility]   ? skin ca, posterior R ear   (BENIGN)  ? Colon polyps   ? Complication of anesthesia   ? difficulty intubation , anterior larynx, easy mask ventilation  ? Coronary artery disease   ? Diabetes mellitus without complication [place]   ? Difficult intubation   ? Diverticulitis   ? GERD (gastroesophageal reflux disease)    ? Glaucoma   ? Headache(784.0)   ? migraines  ? History of kidney stones   ? Hyperlipemia   ? Hypertension   ? 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 SCCA (squamous cell carcinoma) of skin 06/09/2021  ? Mid parietal scalp superior (well diff)  ? SCCA (squamous cell carcinoma) of skin 06/09/2021  ? Mid parietal scalp inferior (in situ)  ? Sleep apnea   ? CPAP still in use q night, & for naps thru out the day, pt. reports that last study was neg for sleep apnea but he still uses the device    ? Spinal stenosis   ? Squamous cell carcinoma of skin 07/18/2020  ? in situ- left ear  ? Squamous cell carcinoma of skin 07/18/2020  ? sup & infil-left buccal cheek  ? Ventricular dilatation   ? Right  ? ?Past Surgical History:  ?Procedure Laterality Date  ? APPENDECTOMY  1991  ? BOWEL RESECTION  1985  ? CARDIAC CATHETERIZATION  2006  ? CATARACT EXTRACTION Right   ? CHOLECYSTECTOMY  1990  ? COLON SURGERY    ? bowel resection, for diverticulitis   ? COLONOSCOPY N/A 06/15/2013  ? Procedure: COLONOSCOPY;  Surgeon: PBeryle Beams MD;  Location: [facility];  Service: Endoscopy;  Laterality: N/A;  ? COLONOSCOPY WITH PROPOFOL N/A 07/01/2018  ? Procedure: COLONOSCOPY WITH PROPOFOL;  Surgeon: HCarol Ada MD;  Location: [facility];  Service: Endoscopy;  Laterality: N/A;  ?  CORONARY ANGIOPLASTY    ? STENT PLACED  ? LAMINECTOMY WITH POSTERIOR LATERAL ARTHRODESIS LEVEL 1 N/A 04/09/2017  ? Procedure: Lumbar TwoThree,Lumbar Three-Four,Lumbar Four-Five Laminectomy with Lumbar Five-Sacral One Posterior lateral fusion;  Surgeon: Eustace Moore, MD;  Location: [facility];  Service: Neurosurgery;  Laterality: N/A;  ? LITHOTRIPSY    ? PARATHYROIDECTOMY Left 12/22/2021  ? Procedure: LEFT INFERIOR PARATHYROIDECTOMY;  Surgeon: Armandina Gemma, MD;  Location: [facility];  Service: General;  Laterality: Left;  ? POLYPECTOMY  07/01/2018  ? Procedure: POLYPECTOMY;  Surgeon: Carol Ada, MD;  Location: [facility];  Service: Endoscopy;;  ? SPINAL CORD  STIMULATOR INSERTION N/A 03/20/2016  ? Procedure: LUMBAR SPINAL CORD STIMULATOR INSERTION;  Surgeon: Clydell Hakim, MD;  Location: [facility];  Service: Neurosurgery;  Laterality: N/A;  ? TONSILLECTOMY    ? UVULA    ? [place] ?  ? ?Family History  ?Problem Relation Age of Onset  ? Heart disease Father   ? Heart attack Father   ? Heart disease Paternal Grandmother   ? Hypercalcemia Neg Hx   ?  ?Social History  ? ?Tobacco Use  ? Smoking status: Never  ? Smokeless tobacco: Never  ?Substance Use Topics  ? Alcohol use: Yes  ?  Alcohol/week: 0.0 standard drinks  ?  Comment: seldom   ? ?ROS  ?Review of Systems  ?Constitutional: Negative for malaise/fatigue and weight gain.  ?Cardiovascular:  Positive for dyspnea on exertion (stable). Negative for chest pain, claudication, leg swelling, near-syncope, orthopnea, palpitations, paroxysmal nocturnal dyspnea and syncope.  ?Respiratory:  Positive for cough. Negative for shortness of breath.   ?Hematologic/Lymphatic: Does not bruise/bleed easily.  ?Musculoskeletal:  Positive for arthritis (gout) and back pain (chronic).  ?Neurological:  Negative for dizziness and weakness.  ?Objective  ?Blood pressure 119/67, pulse 65, temperature 97.7 ?F (36.5 ?C), temperature source Temporal, resp. rate 17, height 6' (1.829 m), weight 205 lb 12.8 oz (93.4 kg), SpO2 94 %.  ? ?  03/10/2022  ? 10:30 AM 12/22/2021  ? 12:39 PM 12/22/2021  ? 12:30 PM  ?Vitals with BMI  ?Height '6\' 0"'$     ?Weight 205 lbs 13 oz    ?BMI 27.91    ?Systolic 517 616 073  ?Diastolic 67 82 73  ?Pulse 65 62 63  ?  ? Physical Exam ?Vitals reviewed.  ?HENT:  ?   Head:  ?   Comments: No tenderness to palpation of temporal regions. ?Neck:  ?   Vascular: Carotid bruit (right) present.  ?Cardiovascular:  ?   Rate and Rhythm: Normal rate and regular rhythm.  ?   Pulses: Intact distal pulses.  ?   Heart sounds: Normal heart sounds, S1 normal and S2 normal. No murmur heard. ?  No gallop.  ?   Comments: No leg edema, no JVD. Except  right DP, all pulses 2 plus and normal.  ?Pulmonary:  ?   Effort: Pulmonary effort is normal. No respiratory distress.  ?   Breath sounds: Normal breath sounds. No wheezing, rhonchi or rales.  ?Musculoskeletal:  ?   Right lower leg: No edema.  ?   Left lower leg: No edema.  ?Skin: ?   General: Skin is warm and dry.  ?   Findings: No erythema or lesion.  ?Neurological:  ?   Mental Status: He is alert.  ?Physical exam unchanged plan to previous office visit. ? ?Laboratory examination:  ? ?Recent Labs  ?  12/17/21 ?1447  ?NA 137  ?K 4.6  ?  CL 105  ?CO2 23  ?GLUCOSE 109*  ?BUN 21  ?CREATININE 0.86  ?CALCIUM 10.7*  ?GFRNONAA >60  ? ? ?CrCl cannot be calculated (Patient's most recent lab result is older than the maximum 21 days allowed.).  ? ?  Latest Ref Rng & Units 12/17/2021  ?  2:47 PM 08/27/2020  ? 11:39 AM 08/19/2020  ? 10:36 AM  ?CMP  ?Glucose 70 - 99 mg/dL 109    120    ?BUN 8 - 23 mg/dL 21    18    ?Creatinine 0.61 - 1.24 mg/dL 0.86    0.85    ?Sodium 135 - 145 mmol/L 137    143    ?Potassium 3.5 - 5.1 mmol/L 4.6    4.2    ?Chloride 98 - 111 mmol/L 105    105    ?CO2 22 - 32 mmol/L 23    24    ?Calcium 8.9 - 10.3 mg/dL 10.7   11.2   10.7    ?Total Protein 6.1 - 8.1 g/dL  7.0     ? ? ?  Latest Ref Rng & Units 12/17/2021  ?  2:47 PM 01/03/2019  ?  4:26 PM 08/23/2018  ?  5:28 PM  ?CBC  ?WBC 4.0 - 10.5 K/uL 6.0   5.8   6.1    ?Hemoglobin 13.0 - 17.0 g/dL 15.9   16.2   16.1    ?Hematocrit 39.0 - 52.0 % 47.0   47.8   46.7    ?Platelets 150 - 400 K/uL 154   194   164    ? ?Lipid Panel  ?   ?Component Value Date/Time  ? CHOL 162 04/08/2020 0902  ? TRIG 223 (H) 04/08/2020 0902  ? HDL 36 (L) 04/08/2020 0902  ? CHOLHDL 4.5 04/08/2020 0902  ? CHOLHDL 3.8 06/03/2016 1201  ? VLDL 27 06/03/2016 1201  ? [place] 88 04/08/2020 0902  ? LDLDIRECT 68.5 09/27/2008 1008  ? ?HEMOGLOBIN A1C ?Lab Results  ?Component Value Date  ? HGBA1C 6.3 (H) 12/17/2021  ? MPG 134 12/17/2021  ? ?TSH ?No results for input(s): TSH in the last 8760  hours. ? ?External Labs:  ?09/23/2021: ?Hgb 15.4, HCT 44.9, platelet 167, ?BUN 18, creatinine 0.85, sodium 144, potassium 4.8 ?Triglycerides 242, HDL 35, LDL 59 ?A1c 6.3% ? ?06/23/2021: ?HDL 37, LDL 65, total cholester

## 2022-03-10 ENCOUNTER — Ambulatory Visit: Payer: Medicare Other | Admitting: Student

## 2022-03-10 ENCOUNTER — Encounter: Payer: Self-pay | Admitting: Student

## 2022-03-10 ENCOUNTER — Other Ambulatory Visit: Payer: Self-pay

## 2022-03-10 ENCOUNTER — Inpatient Hospital Stay: Payer: Medicare Other

## 2022-03-10 VITALS — BP 119/67 | HR 65 | Temp 97.7°F | Resp 17 | Ht 72.0 in | Wt 205.8 lb

## 2022-03-10 DIAGNOSIS — I10 Essential (primary) hypertension: Secondary | ICD-10-CM

## 2022-03-10 DIAGNOSIS — R002 Palpitations: Secondary | ICD-10-CM

## 2022-03-10 DIAGNOSIS — E781 Pure hyperglyceridemia: Secondary | ICD-10-CM

## 2022-03-10 MED ORDER — ICOSAPENT ETHYL 1 G PO CAPS: 2.0000 g | ORAL_CAPSULE | Freq: Two times a day (BID) | ORAL | 3 refills | Status: DC

## 2022-04-09 ENCOUNTER — Other Ambulatory Visit: Payer: Self-pay | Admitting: Cardiology

## 2022-05-05 ENCOUNTER — Encounter: Payer: Self-pay | Admitting: Student

## 2022-05-05 ENCOUNTER — Ambulatory Visit: Payer: Medicare Other | Admitting: Student

## 2022-05-05 VITALS — BP 121/68 | HR 68 | Temp 97.7°F | Resp 17 | Ht 72.0 in | Wt 210.2 lb

## 2022-05-05 DIAGNOSIS — E781 Pure hyperglyceridemia: Secondary | ICD-10-CM

## 2022-05-05 DIAGNOSIS — R002 Palpitations: Secondary | ICD-10-CM

## 2022-05-05 DIAGNOSIS — I493 Ventricular premature depolarization: Secondary | ICD-10-CM

## 2022-05-05 NOTE — Progress Notes (Signed)
Primary Physician/Referring:  Armanda Heritage, NP  Patient ID: [name], male    DOB: [birth year], 76 y.o.   MRN: [id number]  Chief Complaint  Patient presents with   Coronary Artery Disease   Palpitations    8 WEEKS   HPI:    [name]  is a 76 y.o. Caucasian male patient with coronary artery disease and has undergone PCI to the RCA on 07/23/2005 with implantation of a 2.5 x 18 Cypher drug-eluting stent.  He has moderate disease in his proximal LAD.  He also has history of hypertension, hyperlipidemia and diabetes mellitus and obstructive sleep apnea on CPAP and compliant and follows Dr. Rexene Alberts.  He has history of spinal stenosis S/P Laminectomy 03/20/2016 and chronic back pain.  Patient was last seen in our office 03/10/2022 with concerns of palpitations.  At that time ordered 2-week cardiac monitor which revealed no significant cardiac arrhythmias, symptomatic PVCs.  Also last office visit recommended starting Vascepa, however patient is opted not to do this given cost concerns and will continue instead to prefer diet and lifestyle modifications.  Patient states that since last office visit palpitations have improved and are manageable.  Past Medical History:  Diagnosis Date   Arthritis    DDD, scoliosis, stenosis     Atherosclerotic heart disease native coronary artery w/angina pectoris (HCC)    Calcium blood increased    Cancer (HCC)    skin ca, posterior R ear   (BENIGN)   Colon polyps    Complication of anesthesia    difficulty intubation , anterior larynx, easy mask ventilation   Coronary artery disease    Diabetes mellitus without complication (HCC)    Difficult intubation    Diverticulitis    GERD (gastroesophageal reflux disease)    Glaucoma    Headache(784.0)    migraines   History of kidney stones    Hyperlipemia    Hypertension    Right ventricular dilation, secondary 01/09/2019   SCCA (squamous cell carcinoma) of skin 06/09/2021   Mid parietal scalp  superior (well diff)   SCCA (squamous cell carcinoma) of skin 06/09/2021   Mid parietal scalp inferior (in situ)   Sleep apnea    CPAP still in use q night, & for naps thru out the day, pt. 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   sup & infil-left buccal cheek   Ventricular dilatation    Right   Past Surgical History:  Procedure Laterality Date   [place]  2006   CATARACT EXTRACTION Right    CHOLECYSTECTOMY  1990   COLON SURGERY     bowel resection, for diverticulitis    COLONOSCOPY N/A 06/15/2013   Procedure: COLONOSCOPY;  Surgeon: Beryle Beams, MD;  Location: [facility];  Service: Endoscopy;  Laterality: N/A;   COLONOSCOPY WITH PROPOFOL N/A 07/01/2018   Procedure: COLONOSCOPY WITH PROPOFOL;  Surgeon: Carol Ada, MD;  Location: [facility];  Service: Endoscopy;  Laterality: N/A;   CORONARY ANGIOPLASTY     STENT PLACED   LAMINECTOMY WITH POSTERIOR LATERAL ARTHRODESIS LEVEL 1 N/A 04/09/2017   Procedure: Lumbar TwoThree,Lumbar Three-Four,Lumbar Four-Five Laminectomy with Lumbar Five-Sacral One Posterior lateral fusion;  Surgeon: Eustace Moore, MD;  Location:  [facility];  Service: Neurosurgery;  Laterality: N/A;   LITHOTRIPSY     PARATHYROIDECTOMY Left 12/22/2021   Procedure: LEFT INFERIOR PARATHYROIDECTOMY;  Surgeon: Armandina Gemma, MD;  Location: [facility];  Service: General;  Laterality: Left;   PARATHYROIDECTOMY  2023   POLYPECTOMY  07/01/2018   Procedure: POLYPECTOMY;  Surgeon: Carol Ada, MD;  Location: [facility];  Service: Endoscopy;;   SPINAL CORD STIMULATOR INSERTION N/A 03/20/2016   Procedure: LUMBAR SPINAL CORD STIMULATOR INSERTION;  Surgeon: Clydell Hakim, MD;  Location: [facility];  Service: Neurosurgery;  Laterality: N/A;   [place] ?   Family History  Problem Relation Age of Onset   Heart disease Father    Heart attack Father    Heart disease Paternal Grandmother    Hypercalcemia Neg Hx     Social History   Tobacco Use   Smoking status: Never   Smokeless tobacco: Never  Substance Use Topics   Alcohol use: Yes    Alcohol/week: 0.0 standard drinks of alcohol    Comment: seldom    ROS  Review of Systems  Cardiovascular:  Positive for dyspnea on exertion (stable). Negative for chest pain, claudication, leg swelling, near-syncope, orthopnea, palpitations, paroxysmal nocturnal dyspnea and syncope.  Respiratory:  Negative for shortness of breath.   Musculoskeletal:  Positive for arthritis (gout) and back pain (chronic).   Objective  Blood pressure 121/68, pulse 68, temperature 97.7 F (36.5 C), temperature source Temporal, resp. rate 17, height 6' (1.829 m), weight 210 lb 3.2 oz (95.3 kg), SpO2 96 %.     05/05/2022   11:13 AM 03/10/2022   10:30 AM 12/22/2021   12:39 PM  Vitals with BMI  Height _0  _1    Weight 210 lbs 3 oz 205 lbs 13 oz   BMI 37.4 82.70   Systolic 786 754 492  Diastolic 68 67 82  Pulse 68 65 62     Physical Exam Vitals reviewed.  HENT:     Head:     Comments: No tenderness to palpation of temporal regions. Neck:     Vascular: Carotid bruit (right) present.  Cardiovascular:     Rate and Rhythm: Normal rate and regular rhythm.     Pulses: Intact distal pulses.     Heart sounds: Normal heart sounds, S1 normal and S2 normal. No murmur heard.    No gallop.     Comments: No leg edema, no JVD. Except right DP, all pulses 2 plus and normal.  Pulmonary:     Effort: Pulmonary effort is normal. No respiratory distress.     Breath sounds: Normal breath sounds. No wheezing, rhonchi or rales.  Musculoskeletal:     Right lower leg: No edema.     Left lower leg: No edema.    Laboratory examination:   Recent Labs    12/17/21 1447  NA 137  K 4.6   CL 105  CO2 23  GLUCOSE 109*  BUN 21  CREATININE 0.86  CALCIUM 10.7*  GFRNONAA >60    CrCl cannot be calculated (Patient's most recent lab result is older than the maximum 21 days allowed.).     Latest Ref Rng & Units 12/17/2021    2:47 PM 08/27/2020   11:39 AM 08/19/2020   10:36 AM  CMP  Glucose 70 - 99 mg/dL 109   120   BUN 8 - 23 mg/dL 21   18   Creatinine 0.61 -  1.24 mg/dL 0.86   0.85   Sodium 135 - 145 mmol/L 137   143   Potassium 3.5 - 5.1 mmol/L 4.6   4.2   Chloride 98 - 111 mmol/L 105   105   CO2 22 - 32 mmol/L 23   24   Calcium 8.9 - 10.3 mg/dL 10.7  11.2  10.7   Total Protein 6.1 - 8.1 g/dL  7.0        Latest Ref Rng & Units 12/17/2021    2:47 PM 01/03/2019    4:26 PM 08/23/2018    5:28 PM  CBC  WBC 4.0 - 10.5 K/uL 6.0  5.8  6.1   Hemoglobin 13.0 - 17.0 g/dL 15.9  16.2  16.1   Hematocrit 39.0 - 52.0 % 47.0  47.8  46.7   Platelets 150 - 400 K/uL 154  194  164    Lipid Panel     Component Value Date/Time   CHOL 162 04/08/2020 0902   TRIG 223 (H) 04/08/2020 0902   HDL 36 (L) 04/08/2020 0902   CHOLHDL 4.5 04/08/2020 0902   CHOLHDL 3.8 06/03/2016 1201   VLDL 27 06/03/2016 1201   LDLCALC 88 04/08/2020 0902   LDLDIRECT 68.5 09/27/2008 1008   HEMOGLOBIN A1C Lab Results  Component Value Date   HGBA1C 6.3 (H) 12/17/2021   MPG 134 12/17/2021   TSH No results for input(s): "TSH" in the last 8760 hours.  External Labs:  09/23/2021: Hgb 15.4, HCT 44.9, platelet 167, BUN 18, creatinine 0.85, sodium 144, potassium 4.8 Triglycerides 242, HDL 35, LDL 59 A1c 6.3%  06/23/2021: HDL 37, LDL 65, total cholesterol 142, triglycerides 259, A1c 6.1% BUN 18, creatinine 0.84, GFR >60  11/14/2020: Total cholesterol 146, triglycerides 246, HDL 36, LDL 70 A1c 6.2% Glucose 111, BUN 20, creatinine 0.93, GFR 81, sodium 143, potassium 4.4, AST 16, ALT 60  Allergies   Allergies  Allergen Reactions   Fluticasone Propionate     Nose bleed (Flonase)   Amaryl Other (See  Comments)    Gives pt migraines   Benzalk Cl-Oxyquinoline Sulf Other (See Comments)    Nose Bleeds  (preservative commonly used in nasal decongestant sprays)   Bisacodyl     Causes headaches (dulcolax)   Chlorthalidone     High Glucose levels   Hydrocodone     Ineffective    Other Other (See Comments)    Pistachios, lima beans, and some types of chocolate cause migraines  Nasal corticosteroids causes nose bleeds   Skelaxin [Metaxalone] Dermatitis and Other (See Comments)    Cherry red rash with sloughing off of skin at left upper chest/shoulder   Stool Softener [Dss] Other (See Comments)    Causes migraines   Amoxicillin     Other reaction(s): ineffective on dental infections   Norco [Hydrocodone-Acetaminophen] Other (See Comments)    Ineffective   Pistachio Nut (Diagnostic) Other (See Comments)     In  Addition : Lima beans, milk chocolate - Migraine   Banana     Migraines    Levofloxacin Nausea Only   Lisinopril Cough   Metformin And Related Diarrhea    Medications Prior to Visit:   Outpatient Medications Prior to Visit  Medication Sig Dispense Refill   acetaminophen (TYLENOL) 650 MG CR tablet Take 650 mg by mouth every 8 (eight) hours.     aspirin 81 MG tablet Take 81 mg by mouth daily.     baclofen (LIORESAL) 10 MG tablet Take 5 mg by mouth  4 (four) times daily.     brimonidine-timolol (COMBIGAN) 0.2-0.5 % ophthalmic solution Place 1 drop into both eyes 2 (two) times daily.     cetirizine (ZYRTEC) 10 MG tablet Take 10 mg by mouth daily.     Chlorpheniramine-DM (CORICIDIN COUGH/COLD) 4-30 MG TABS Take 1 tablet by mouth in the morning and at bedtime.     Cholecalciferol 25 MCG (1000 UT) tablet Take 1,000 Units by mouth daily.     cloNIDine (CATAPRES) 0.1 MG tablet Take 0.1 mg by mouth 2 (two) times daily.     dorzolamide (TRUSOPT) 2 % ophthalmic solution Place 1 drop into both eyes 2 (two) times daily.     empagliflozin (JARDIANCE) 25 MG TABS tablet Take 25 mg by mouth  daily. (Patient taking differently: Take 25 mg by mouth every evening.) 90 tablet 3   FREESTYLE LITE test strip USE TO TEST BLOOD SUGAR ONCE DAILY 100 strip 3   Lancets (FREESTYLE) lancets Use to test blood sugar once daily 100 each 12   latanoprost (XALATAN) 0.005 % ophthalmic solution Place 1 drop into both eyes at bedtime.     losartan (COZAAR) 50 MG tablet Take 50 mg by mouth in the morning and at bedtime.     metoprolol tartrate (LOPRESSOR) 25 MG tablet TAKE 1 TABLET BY MOUTH TWICE DAILY 180 tablet 3   Multiple Vitamins-Minerals (PRESERVISION AREDS 2) CAPS Take 1 capsule by mouth 2 (two) times daily.     nitroGLYCERIN (NITROSTAT) 0.4 MG SL tablet Place 0.4 mg under the tongue every 5 (five) minutes as needed for chest pain.     OVER THE COUNTER MEDICATION Take 1,000 mg by mouth daily. Microlactin 1000 mg Supplement     OVER THE COUNTER MEDICATION Take 5 drops by mouth every evening. CBD Hemp Oil  0.75 mg/mL     Riboflavin (B-2-400) 400 MG CAPS Take 400 mg by mouth daily.     rosuvastatin (CRESTOR) 20 MG tablet Take 1 tablet (20 mg total) by mouth daily. 90 tablet 3   sitaGLIPtin (JANUVIA) 100 MG tablet Take 1 tablet (100 mg total) by mouth daily. 90 tablet 3   spironolactone (ALDACTONE) 25 MG tablet Take 1 tablet (25 mg total) by mouth daily. 30 tablet 3   SUMAtriptan (IMITREX) 50 MG tablet TAKE 1 TABLET BY MOUTH EVERY 2 HOURS AS NEEDED FOR MIGRAINE (Patient taking differently: Take 25 mg by mouth every 2 (two) hours as needed for migraine. TAKE 1/2 TABLET BY MOUTH EVERY 2 HOURS AS NEEDED FOR MIGRAINE) 9 tablet 3   TART CHERRY PO Take 120 mLs by mouth every evening. 4 oz     vitamin B-12 (CYANOCOBALAMIN) 1000 MCG tablet Take 1,000 mcg by mouth daily.     icosapent Ethyl (VASCEPA) 1 g capsule Take 2 capsules (2 g total) by mouth 2 (two) times daily. 120 capsule 3   No facility-administered medications prior to visit.   Final Medications at End of Visit    Current Meds  Medication Sig    acetaminophen (TYLENOL) 650 MG CR tablet Take 650 mg by mouth every 8 (eight) hours.   aspirin 81 MG tablet Take 81 mg by mouth daily.   baclofen (LIORESAL) 10 MG tablet Take 5 mg by mouth 4 (four) times daily.   brimonidine-timolol (COMBIGAN) 0.2-0.5 % ophthalmic solution Place 1 drop into both eyes 2 (two) times daily.   cetirizine (ZYRTEC) 10 MG tablet Take 10 mg by mouth daily.   Chlorpheniramine-DM (CORICIDIN COUGH/COLD) 4-30 MG TABS  Take 1 tablet by mouth in the morning and at bedtime.   Cholecalciferol 25 MCG (1000 UT) tablet Take 1,000 Units by mouth daily.   cloNIDine (CATAPRES) 0.1 MG tablet Take 0.1 mg by mouth 2 (two) times daily.   dorzolamide (TRUSOPT) 2 % ophthalmic solution Place 1 drop into both eyes 2 (two) times daily.   empagliflozin (JARDIANCE) 25 MG TABS tablet Take 25 mg by mouth daily. (Patient taking differently: Take 25 mg by mouth every evening.)   FREESTYLE LITE test strip USE TO TEST BLOOD SUGAR ONCE DAILY   Lancets (FREESTYLE) lancets Use to test blood sugar once daily   latanoprost (XALATAN) 0.005 % ophthalmic solution Place 1 drop into both eyes at bedtime.   losartan (COZAAR) 50 MG tablet Take 50 mg by mouth in the morning and at bedtime.   metoprolol tartrate (LOPRESSOR) 25 MG tablet TAKE 1 TABLET BY MOUTH TWICE DAILY   Multiple Vitamins-Minerals (PRESERVISION AREDS 2) CAPS Take 1 capsule by mouth 2 (two) times daily.   nitroGLYCERIN (NITROSTAT) 0.4 MG SL tablet Place 0.4 mg under the tongue every 5 (five) minutes as needed for chest pain.   OVER THE COUNTER MEDICATION Take 1,000 mg by mouth daily. Microlactin 1000 mg Supplement   OVER THE COUNTER MEDICATION Take 5 drops by mouth every evening. CBD Hemp Oil  0.75 mg/mL   Riboflavin (B-2-400) 400 MG CAPS Take 400 mg by mouth daily.   rosuvastatin (CRESTOR) 20 MG tablet Take 1 tablet (20 mg total) by mouth daily.   sitaGLIPtin (JANUVIA) 100 MG tablet Take 1 tablet (100 mg total) by mouth daily.   spironolactone  (ALDACTONE) 25 MG tablet Take 1 tablet (25 mg total) by mouth daily.   SUMAtriptan (IMITREX) 50 MG tablet TAKE 1 TABLET BY MOUTH EVERY 2 HOURS AS NEEDED FOR MIGRAINE (Patient taking differently: Take 25 mg by mouth every 2 (two) hours as needed for migraine. TAKE 1/2 TABLET BY MOUTH EVERY 2 HOURS AS NEEDED FOR MIGRAINE)   TART CHERRY PO Take 120 mLs by mouth every evening. 4 oz   vitamin B-12 (CYANOCOBALAMIN) 1000 MCG tablet Take 1,000 mcg by mouth daily.   Radiology:   No results found.  Cardiac Studies:   Coronary angiogram 07/23/2005 PTCA/Stent Mid RCA 2.5 x 18 Cypher drug-eluting stent. Moderate disease in proximal LAD.  Exercise Myoview stress test 11/27/2013: 1. Resting EKG showed normal sinus rhythm, poor R wave progression, non-specific ST-depression in inferior leads, cannot R/O ischemia. Stress EKG was equivocal for ischemia. There was 1.5 mm additional up-sloping ST depression noted at peak exercise, which resolved at > 2 minutes into recovery. Patient exercised on BRUCE PROTOCOL for 7 minutes 1 seconds. The maximum work level achieved was 7.6 MET's. The test was terminated due to achievement of the target heart rate. 2. Perfusion imaging study demonstrated no evidence of ischemia. The right ventricle was prominent and suggests RV dilatation/hypertrophy. Dynamic gated images reveal normal wall motion and endocardial thickening. Left ventricular ejection fraction was estimated to be 73%. This is a low risk study.  Abdominal Aortic Duplex  [08/13/2015]: Diffuse plaque noted in the proximal aorta. Focal plaque noted in the mid and distal aorta. mild ectasia noted in the abdominal aorta. No AAA observed. No significant change from 01/24/15, previously noted AAA is probably ectasia.  Echocardiogram 01/11/2018: Left ventricle cavity is normal in size. Mild concentric hypertrophy of the left ventricle.  Normal global wall motion. Doppler evidence of grade I (impaired) diastolic dysfunction,  normal LAP. Calculated  EF 58%. Left atrial cavity is mildly dilated. Mild (Grade I) mitral regurgitation. Mild chordal SAM. Mild to moderate tricuspid regurgitation. Estimated pulmonary artery systolic pressure 29  mmHg. Mild pulmonic regurgitation. Compared to prior study in 2015, RV function has improved, PASP has decreased.  Carotid artery duplex 03/11/2021:  Duplex suggests stenosis in the right and left external carotid artery (<50%).  Minimal homogenous plaque present bilateral carotid arteries. No significant ICA stenosis bilateral.  Antegrade right vertebral artery flow. Antegrade left vertebral artery flow.  External carotid artery stenosis source of bruit. Follow up studies if clinically indicated.  Ambulatory cardiac telemetry 14 days (03/10/2022 - 03/24/2022): Predominant underlying rhythm was sinus with first-degree AV block.  Rare PACs and PVCs.  Patient triggered events correlated with normal sinus rhythm and PVCs.  No evidence of significant cardiac arrhythmia including ventricular tachycardia, pauses >3 seconds, high degree AV block, atrial fibrillation, SVT.  Minimum heart rate 42 bpm, maximum heart rate 108 bpm, average heart rate 60 bpm.  Assessment  03/10/2022: Sinus rhythm at a rate of 60 bpm.  Left atrial enlargement.  Normal axis.  Poor R wave progression, cannot exclude anteroseptal infarct old.  Nonspecific T wave abnormality.  Low voltage complexes.  Compared EKG 09/09/2021, no significant change.  EKG 01/10/2020: Normal sinus rhythm with rate of 64 bpm, leftward enlargement, normal axis.  Poor R wave progression, cannot exclude anteroseptal infarct old.  Nonspecific ST-T abnormality, cannot exclude inferior ischemia.   No significant change from  EKG 01/09/2019 last year   Assessment     ICD-10-CM   1. Palpitations  R00.2     2. Hypertriglyceridemia  E78.1     3. Premature ventricular contraction  I49.3          No orders of the defined types were placed in  this encounter.   Medications Discontinued During This Encounter  Medication Reason   icosapent Ethyl (VASCEPA) 1 g capsule Prescription never filled     Recommendations:   [name]  is a 76 y.o. Caucasian male patient with coronary artery disease and has undergone PCI to the RCA on 07/23/2005 with implantation of a 2.5 x 18 Cypher drug-eluting stent.  He has moderate disease in his proximal LAD.  He also has history of hypertension, hyperlipidemia and diabetes mellitus and obstructive sleep apnea on CPAP and compliant and follows Dr. Rexene Alberts.  He has history of spinal stenosis S/P Laminectomy 03/20/2016 and chronic back pain.  Patient presents for 8-week follow-up.  He states palpitations have improved since last office visit.  Reviewed and discussed results of cardiac monitor, details above.  As patient's symptoms have improved and are currently manageable we will not make changes at this time.  In regard to hypertriglyceridemia patient opts not to start Vascepa and rather continue with diet and lifestyle modifications.  Patient is otherwise stable from a cardiac standpoint.  Follow-up in 6 months, sooner if needed.   Alethia Berthold, PA-C 05/05/2022, 11:51 AM Office: [phone]

## 2022-06-09 ENCOUNTER — Ambulatory Visit (INDEPENDENT_AMBULATORY_CARE_PROVIDER_SITE_OTHER): Payer: Medicare Other | Admitting: Dermatology

## 2022-06-09 DIAGNOSIS — D1801 Hemangioma of skin and subcutaneous tissue: Secondary | ICD-10-CM

## 2022-06-09 DIAGNOSIS — L570 Actinic keratosis: Secondary | ICD-10-CM | POA: Diagnosis not present

## 2022-06-09 DIAGNOSIS — Z85828 Personal history of other malignant neoplasm of skin: Secondary | ICD-10-CM | POA: Diagnosis not present

## 2022-06-09 DIAGNOSIS — R202 Paresthesia of skin: Secondary | ICD-10-CM | POA: Diagnosis not present

## 2022-06-09 DIAGNOSIS — Z1283 Encounter for screening for malignant neoplasm of skin: Secondary | ICD-10-CM | POA: Diagnosis not present

## 2022-06-09 DIAGNOSIS — I2510 Atherosclerotic heart disease of native coronary artery without angina pectoris: Secondary | ICD-10-CM

## 2022-06-09 NOTE — Patient Instructions (Signed)
Pramoxine can be found in Cereve anti itch cream.

## 2022-07-01 ENCOUNTER — Encounter: Payer: Self-pay | Admitting: Dermatology

## 2022-07-01 NOTE — Progress Notes (Signed)
   Follow-Up Visit   Subjective  [name] is a 76 y.o. male who presents for the following: Annual Exam (Follow up on left ear and scalp. History of non mole skin cancer.).  General skin examination, several spots of concern Location:  Duration:  Quality:  Associated Signs/Symptoms: Modifying Factors:  Severity:  Timing: Context:   Objective  Well appearing patient in no apparent distress; mood and affect are within normal limits. Scalp (2) 3 mm hornlike pink crusts  Mid Back Several 1 to 2 mm smooth red dermal papules  Mid Back Mid back itching without visible rash or inflamed growth compatible with neurogenic pruritus    All skin waist up examined.   Assessment & Plan    AK (actinic keratosis) (2) Scalp  Destruction of lesion - Scalp Complexity: simple   Destruction method: cryotherapy   Informed consent: discussed and consent obtained   Timeout:  patient name, date of birth, surgical site, and procedure verified Lesion destroyed using liquid nitrogen: Yes   Cryotherapy cycles:  5 Outcome: patient tolerated procedure well with no complications    Hemangioma of skin Mid Back  No intervention necessary  Notalgia paresthetica Mid Back  He can look for an over-the-counter product containing the ingredient pramoxine such as CeraVe itch relief.      I, [name], MD, have reviewed all documentation for this visit.  The documentation on 07/01/22 for the exam, diagnosis, procedures, and orders are all accurate and complete.

## 2022-11-04 ENCOUNTER — Ambulatory Visit: Payer: PRIVATE HEALTH INSURANCE | Admitting: Cardiology

## 2022-11-12 ENCOUNTER — Encounter: Payer: Self-pay | Admitting: Cardiology

## 2022-11-12 ENCOUNTER — Ambulatory Visit: Payer: Medicare Other | Admitting: Cardiology

## 2022-11-12 VITALS — BP 134/73 | HR 55 | Ht 72.0 in | Wt 208.8 lb

## 2022-11-12 DIAGNOSIS — I25118 Atherosclerotic heart disease of native coronary artery with other forms of angina pectoris: Secondary | ICD-10-CM

## 2022-11-12 DIAGNOSIS — E782 Mixed hyperlipidemia: Secondary | ICD-10-CM

## 2022-11-12 DIAGNOSIS — I10 Essential (primary) hypertension: Secondary | ICD-10-CM

## 2022-11-12 NOTE — Progress Notes (Signed)
Primary Physician/Referring:  Armanda Heritage, NP  Patient ID: [name], male    DOB: [birth year], 76 y.o.   MRN: [id number]  Chief Complaint  Patient presents with   Atherosclerosis of native coronary artery of native heart w   Hyperlipidemia   Follow-up    6 month   HPI:    [name]  is a 76 y.o.  Caucasian male patient with coronary artery disease with PCI to the RCA on 07/23/2005 with implantation of a 2.5 x 18 Cypher drug-eluting stent, moderate disease in his proximal LAD, hypertension, hyperlipidemia, diabetes mellitus and obstructive sleep apnea on CPAP and compliant and follows Dr. Rexene Alberts.  He has history of spinal stenosis S/P Laminectomy 03/20/2016 and chronic back pain.  Patient presents for 76-monthoffice visit, states that he is currently doing well and has not had any chest pain or dyspnea however states that his activity is markedly limited due to chronic back pain.  Past Medical History:  Diagnosis Date   Arthritis    DDD, scoliosis, stenosis     Atherosclerotic heart disease native coronary artery w/angina pectoris (HCC)    Calcium blood increased    Cancer (HCC)    skin ca, posterior R ear   (BENIGN)   Colon polyps    Complication of anesthesia    difficulty intubation , anterior larynx, easy mask ventilation   Coronary artery disease    Diabetes mellitus without complication (HCC)    Difficult intubation    Diverticulitis    GERD (gastroesophageal reflux disease)    Glaucoma    Headache(784.0)    migraines   History of kidney stones    Hyperlipemia    Hypertension    Right ventricular dilation, 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 reports that last study was neg for sleep apnea but he still uses  the device     Spinal stenosis    Squamous cell carcinoma of skin 07/18/2020   in situ- left ear   Squamous cell carcinoma of skin 07/18/2020   sup & infil-left buccal cheek   Ventricular dilatation    Right   Past Surgical History:  Procedure Laterality Date   [place]   CATARACT EXTRACTION Right    CHOLECYSTECTOMY  1990   COLON SURGERY     bowel resection, for diverticulitis    COLONOSCOPY N/A 06/15/2013   Procedure: COLONOSCOPY;  Surgeon: PBeryle Beams MD;  Location: [facility];  Service: Endoscopy;  Laterality: N/A;   COLONOSCOPY WITH PROPOFOL N/A 07/01/2018   Procedure: COLONOSCOPY WITH PROPOFOL;  Surgeon: HCarol Ada MD;  Location: [facility];  Service: Endoscopy;  Laterality: N/A;   CORONARY ANGIOPLASTY     STENT PLACED   LAMINECTOMY WITH POSTERIOR LATERAL ARTHRODESIS LEVEL 1 N/A 04/09/2017   Procedure: Lumbar TwoThree,Lumbar Three-Four,Lumbar Four-Five Laminectomy with Lumbar Five-Sacral One Posterior lateral fusion;  Surgeon: JEustace Moore MD;  [place] [facility]  Service: [place]surgery;  Laterality: N/A;   LITHOTRIPSY     PARATHYROIDECTOMY Left 12/22/2021   Procedure: LEFT INFERIOR PARATHYROIDECTOMY;  Surgeon: GArmandina Gemma MD;  Location: [facility];  Service: General;  Laterality:  Left;   PARATHYROIDECTOMY  2023   POLYPECTOMY  07/01/2018   Procedure: POLYPECTOMY;  Surgeon: Carol Ada, MD;  Location: [facility];  Service: Endoscopy;;   SPINAL CORD STIMULATOR INSERTION N/A 03/20/2016   Procedure: LUMBAR SPINAL CORD STIMULATOR INSERTION;  Surgeon: Clydell Hakim, MD;  Location: [facility] [facility];  Service: Neurosurgery;  Laterality: N/A;   [place] ?   Social History   Tobacco Use   Smoking status: Never   Smokeless tobacco: Never  Substance Use Topics   Alcohol use: Yes    Alcohol/week: 0.0 standard drinks of alcohol    Comment: seldom    ROS  Review of  Systems  Cardiovascular:  Negative for chest pain, dyspnea on exertion and leg swelling.   Objective  Blood pressure 134/73, pulse (!) 55, height 6' (1.829 m), weight 208 lb 12.8 oz (94.7 kg), SpO2 97 %.     11/12/2022    3:29 PM 05/05/2022   11:13 AM 03/10/2022   10:30 AM  Vitals with BMI  Height _0  _1  _2   Weight 208 lbs 13 oz 210 lbs 3 oz 205 lbs 13 oz  BMI 28.31 95.1 88.41  Systolic 660 630 160  Diastolic 73 68 67  Pulse 55 68 65     Physical Exam Neck:     Vascular: No carotid bruit or JVD.  Cardiovascular:     Rate and Rhythm: Normal rate and regular rhythm.     Pulses: Intact distal pulses.     Heart sounds: Normal heart sounds. No murmur heard.    No gallop.  Pulmonary:     Effort: Pulmonary effort is normal.     Breath sounds: Normal breath sounds.  Abdominal:     General: Bowel sounds are normal.     Palpations: Abdomen is soft.  Musculoskeletal:     Right lower leg: No edema.     Left lower leg: No edema.    Laboratory examination:   Recent Labs    12/17/21 1447  NA 137  K 4.6  CL 105  CO2 23  GLUCOSE 109*  BUN 21  CREATININE 0.86  CALCIUM 10.7*  GFRNONAA >60    CrCl cannot be calculated (Patient's most recent lab result is older than the maximum 21 days allowed.).     Latest Ref Rng & Units 12/17/2021    2:47 PM 08/27/2020   11:39 AM 08/19/2020   10:36 AM  CMP  Glucose 70 - 99 mg/dL 109   120   BUN 8 - 23 mg/dL 21   18   Creatinine 0.61 - 1.24 mg/dL 0.86   0.85   Sodium 135 - 145 mmol/L 137   143   Potassium 3.5 - 5.1 mmol/L 4.6   4.2   Chloride 98 - 111 mmol/L 105   105   CO2 22 - 32 mmol/L 23   24   Calcium 8.9 - 10.3 mg/dL 10.7  11.2  10.7   Total Protein 6.1 - 8.1 g/dL  7.0        Latest Ref Rng & Units 12/17/2021    2:47 PM 01/03/2019    4:26 PM 08/23/2018    5:28 PM  CBC  WBC 4.0 - 10.5 K/uL 6.0  5.8  6.1   Hemoglobin 13.0 - 17.0 g/dL 15.9  16.2  16.1   Hematocrit 39.0 - 52.0 % 47.0  47.8  46.7   Platelets 150 -  400 K/uL  154  194  164    External Labs:   Labs 09/23/2022:  Total cholesterol 224, triglycerides 291, HDL 34, LDL 45.  Serum glucose 171 mg, BUN 21, creatinine 0.90, EGFR 89 mL, potassium 4.3, LFTs normal.  A1c 6.8%.  PTH normal at 36.  Vitamin D reduced at 25.2.  09/23/2021: Hgb 15.4, HCT 44.9, platelet 167, BUN 18, creatinine 0.85, sodium 144, potassium 4.8 Triglycerides 242, HDL 35, LDL 59 A1c 6.3%   Allergies   Allergies  Allergen Reactions   Fluticasone Propionate     Nose bleed (Flonase)   Benzalk Cl-Oxyquinoline Sulf Other (See Comments)    Nose Bleeds  (preservative commonly used in nasal decongestant sprays)   Bisacodyl     Causes headaches (dulcolax)   Chlorthalidone     High Glucose levels   Hydrocodone Other (See Comments)    Ineffective   Metaxalone Dermatitis and Other (See Comments)    Cherry red rash with sloughing off of skin at left upper chest/shoulder   Other Other (See Comments)    Pistachios, lima beans, and some types of chocolate cause migraines  Nasal corticosteroids causes nose bleeds   Stool Softener [Dss] Other (See Comments)    Causes migraines   Amaryl Other (See Comments)   Amoxicillin     Other reaction(s): ineffective on dental infections   Norco [Hydrocodone-Acetaminophen] Other (See Comments)    Ineffective   Pistachio Nut (Diagnostic) Other (See Comments)     In  Addition : Lima beans, milk chocolate - Migraine   Banana     Migraines    Levofloxacin Nausea Only   Lisinopril Cough and Other (See Comments)   Metformin And Related Diarrhea    Final Medications at End of Visit    Current Outpatient Medications:    acetaminophen (TYLENOL) 650 MG CR tablet, Take 650 mg by mouth every 8 (eight) hours., Disp: , Rfl:    aspirin 81 MG tablet, Take 81 mg by mouth daily., Disp: , Rfl:    baclofen (LIORESAL) 10 MG tablet, Take 5 mg by mouth 4 (four) times daily., Disp: , Rfl:    brimonidine-timolol (COMBIGAN) 0.2-0.5 % ophthalmic solution, Place  1 drop into both eyes 2 (two) times daily., Disp: , Rfl:    cetirizine (ZYRTEC) 10 MG tablet, Take 10 mg by mouth daily., Disp: , Rfl:    Chlorpheniramine-DM (CORICIDIN COUGH/COLD) 4-30 MG TABS, Take 1 tablet by mouth in the morning and at bedtime., Disp: , Rfl:    Cholecalciferol 25 MCG (1000 UT) tablet, Take 1,000 Units by mouth daily., Disp: , Rfl:    cloNIDine (CATAPRES) 0.1 MG tablet, Take 0.1 mg by mouth 2 (two) times daily., Disp: , Rfl:    dorzolamide (TRUSOPT) 2 % ophthalmic solution, Place 1 drop into both eyes 2 (two) times daily., Disp: , Rfl:    empagliflozin (JARDIANCE) 25 MG TABS tablet, Take 25 mg by mouth daily. (Patient taking differently: Take 25 mg by mouth every evening.), Disp: 90 tablet, Rfl: 3   FREESTYLE LITE test strip, USE TO TEST BLOOD SUGAR ONCE DAILY, Disp: 100 strip, Rfl: 3   Lancets (FREESTYLE) lancets, Use to test blood sugar once daily, Disp: 100 each, Rfl: 12   latanoprost (XALATAN) 0.005 % ophthalmic solution, Place 1 drop into both eyes at bedtime., Disp: , Rfl:    losartan (COZAAR) 50 MG tablet, Take 50 mg by mouth in the morning and at bedtime., Disp: , Rfl:    metoprolol tartrate (LOPRESSOR)  25 MG tablet, TAKE 1 TABLET BY MOUTH TWICE DAILY, Disp: 180 tablet, Rfl: 3   Multiple Vitamins-Minerals (PRESERVISION AREDS 2) CAPS, Take 1 capsule by mouth 2 (two) times daily., Disp: , Rfl:    mupirocin ointment (BACTROBAN) 2 %, Apply 1 Application topically 2 (two) times daily., Disp: , Rfl:    nitroGLYCERIN (NITROSTAT) 0.4 MG SL tablet, Place 0.4 mg under the tongue every 5 (five) minutes as needed for chest pain., Disp: , Rfl:    OVER THE COUNTER MEDICATION, Take 1,000 mg by mouth daily. Microlactin 1000 mg Supplement, Disp: , Rfl:    OVER THE COUNTER MEDICATION, Take 5 drops by mouth every evening. CBD Hemp Oil  0.75 mg/mL, Disp: , Rfl:    Riboflavin (B-2-400) 400 MG CAPS, Take 400 mg by mouth daily., Disp: , Rfl:    rosuvastatin (CRESTOR) 20 MG tablet, Take 1  tablet (20 mg total) by mouth daily., Disp: 90 tablet, Rfl: 3   sitaGLIPtin (JANUVIA) 100 MG tablet, Take 1 tablet (100 mg total) by mouth daily., Disp: 90 tablet, Rfl: 3   spironolactone (ALDACTONE) 25 MG tablet, Take 1 tablet (25 mg total) by mouth daily., Disp: 30 tablet, Rfl: 3   TART CHERRY PO, Take 120 mLs by mouth every evening. 4 oz, Disp: , Rfl:    vitamin B-12 (CYANOCOBALAMIN) 1000 MCG tablet, Take 1,000 mcg by mouth daily., Disp: , Rfl:    Radiology:   No results found.  Cardiac Studies:   Coronary angiogram 07/23/2005 PTCA/Stent Mid RCA 2.5 x 18 Cypher drug-eluting stent. Moderate disease in proximal LAD.  Exercise Myoview stress test 11/27/2013: 1. Resting EKG showed normal sinus rhythm, poor R wave progression, non-specific ST-depression in inferior leads, cannot R/O ischemia. Stress EKG was equivocal for ischemia. There was 1.5 mm additional up-sloping ST depression noted at peak exercise, which resolved at > 2 minutes into recovery. Patient exercised on BRUCE PROTOCOL for 7 minutes 1 seconds. The maximum work level achieved was 7.6 MET's. The test was terminated due to achievement of the target heart rate. 2. Perfusion imaging study demonstrated no evidence of ischemia. The right ventricle was prominent and suggests RV dilatation/hypertrophy. Dynamic gated images reveal normal wall motion and endocardial thickening. Left ventricular ejection fraction was estimated to be 73%. This is a low risk study.  Abdominal Aortic Duplex  [08/13/2015]: Diffuse plaque noted in the proximal aorta. Focal plaque noted in the mid and distal aorta. mild ectasia noted in the abdominal aorta. No AAA observed. No significant change from 01/24/15, previously noted AAA is probably ectasia.  Echocardiogram 01/11/2018: Left ventricle cavity is normal in size. Mild concentric hypertrophy of the left ventricle.  Normal global wall motion. Doppler evidence of grade I (impaired) diastolic dysfunction, normal LAP.  Calculated EF 58%. Left atrial cavity is mildly dilated. Mild (Grade I) mitral regurgitation. Mild chordal SAM. Mild to moderate tricuspid regurgitation. Estimated pulmonary artery systolic pressure 29  mmHg. Mild pulmonic regurgitation. Compared to prior study in 2015, RV function has improved, PASP has decreased.  Carotid artery duplex 03/11/2021:  Duplex suggests stenosis in the right and left external carotid artery (<50%).  Minimal homogenous plaque present bilateral carotid arteries. No significant ICA stenosis bilateral.  Antegrade right vertebral artery flow. Antegrade left vertebral artery flow.  External carotid artery stenosis source of bruit. Follow up studies if clinically indicated.  Ambulatory cardiac telemetry 14 days (03/10/2022 - 03/24/2022): Predominant underlying rhythm was sinus with first-degree AV block.  Rare PACs and PVCs.  Patient triggered  events correlated with normal sinus rhythm and PVCs.  No evidence of significant cardiac arrhythmia including ventricular tachycardia, pauses >3 seconds, high degree AV block, atrial fibrillation, SVT.  Minimum heart rate 42 bpm, maximum heart rate 108 bpm, average heart rate 60 bpm.  Assessment   EKG 11/12/2022: Sinus bradycardia at the rate of 51 bpm, left atrial enlargement, normal axis.  Borderline low voltage complexes.  No evidence of ischemia, otherwise normal EKG.  Compared to 03/10/2022, no significant change.   Assessment     ICD-10-CM   1. Atherosclerosis of native coronary artery of native heart with stable angina pectoris ([place])  I25.118 EKG 12-Lead    2. Essential hypertension  I10     3. Mixed hyperlipidemia  E78.2          No orders of the defined types were placed in this encounter.   Medications Discontinued During This Encounter  Medication Reason   SUMAtriptan (IMITREX) 50 MG tablet Patient Preference     Recommendations:   [name]  is a 76 y.o. Caucasian male patient with coronary artery  disease with PCI to the RCA on 07/23/2005 with implantation of a 2.5 x 18 Cypher drug-eluting stent, moderate disease in his proximal LAD, hypertension, hyperlipidemia, diabetes mellitus and obstructive sleep apnea on CPAP and compliant and follows Dr. Rexene Alberts.  He has history of spinal stenosis S/P Laminectomy 03/20/2016 and chronic back pain.  1. Atherosclerosis of native coronary artery of native heart with stable angina pectoris [facility]) Patient without recurrence of angina pectoris, presently on aspirin losartan and metoprolol along with statins, continue the same.  EKG without any ischemic changes.  2. Essential hypertension Blood pressure is well-controlled on present medical regimen.  No changes were done today.  Renal function has remained stable.  External labs reviewed.  3. Mixed hyperlipidemia Triglycerides continue to remain markedly elevated.  I suspect this is due to poor diet and lack of exercise.  Again I tried to discuss with him regarding use of triglyceride lowering agents however patient states that he is already taking too many medications and would not want to change anything at this point.  Overall stable from cardiovascular standpoint, symptoms of palpitations have essentially resolved since being on metoprolol, I will see him back on annual basis.    Adrian Prows, PA-C 11/12/2022, 5:57 PM Office: [phone]

## 2022-11-12 NOTE — Patient Instructions (Signed)
If the systolic blood pressure is >150 mmHg, you could certainly take either metoprolol extra dose, clonidine extra dose, losartan extra dose.

## 2022-12-17 NOTE — Progress Notes (Signed)
Left prior to being seen .  

## 2022-12-21 ENCOUNTER — Other Ambulatory Visit: Payer: Self-pay

## 2022-12-21 MED ORDER — NITROGLYCERIN 0.4 MG SL SUBL: 0.4000 mg | SUBLINGUAL_TABLET | SUBLINGUAL | 3 refills | Status: DC | PRN

## 2023-01-06 ENCOUNTER — Ambulatory Visit: Payer: Medicare Other | Admitting: Family Medicine

## 2023-04-13 ENCOUNTER — Other Ambulatory Visit: Payer: Self-pay | Admitting: Cardiology

## 2023-11-15 ENCOUNTER — Ambulatory Visit: Payer: Self-pay | Admitting: Cardiology

## 2023-12-28 ENCOUNTER — Encounter: Payer: Self-pay | Admitting: Cardiology

## 2023-12-28 ENCOUNTER — Ambulatory Visit: Payer: Medicare Other | Attending: Cardiology | Admitting: Cardiology

## 2023-12-28 VITALS — BP 100/60 | HR 67 | Resp 16 | Ht 72.0 in | Wt 205.4 lb

## 2023-12-28 DIAGNOSIS — I10 Essential (primary) hypertension: Secondary | ICD-10-CM | POA: Diagnosis not present

## 2023-12-28 DIAGNOSIS — E782 Mixed hyperlipidemia: Secondary | ICD-10-CM | POA: Diagnosis not present

## 2023-12-28 DIAGNOSIS — E119 Type 2 diabetes mellitus without complications: Secondary | ICD-10-CM | POA: Insufficient documentation

## 2023-12-28 DIAGNOSIS — I25118 Atherosclerotic heart disease of native coronary artery with other forms of angina pectoris: Secondary | ICD-10-CM | POA: Insufficient documentation

## 2023-12-28 MED ORDER — NITROGLYCERIN 0.4 MG SL SUBL
0.4000 mg | SUBLINGUAL_TABLET | SUBLINGUAL | 2 refills | Status: AC | PRN
Start: 2023-12-28 — End: ?

## 2023-12-28 NOTE — Patient Instructions (Signed)
Medication Instructions:  Your physician recommends that you continue on your current medications as directed. Please refer to the Current Medication list given to you today.  *If you need a refill on your cardiac medications before your next appointment, please call your pharmacy*   Lab Work: none If you have labs (blood work) drawn today and your tests are completely normal, you will receive your results only by: MyChart Message (if you have MyChart) OR A paper copy in the mail If you have any lab test that is abnormal or we need to change your treatment, we will call you to review the results.   Testing/Procedures: none   Follow-Up: At [facility], you and your health needs are our priority.  As part of our continuing mission to provide you with exceptional heart care, we have created designated Provider Care Teams.  These Care Teams include your primary Cardiologist (physician) and Advanced Practice Providers (APPs -  Physician Assistants and Nurse Practitioners) who all work together to provide you with the care you need, when you need it.  We recommend signing up for the patient portal called "MyChart".  Sign up information is provided on this After Visit Summary.  MyChart is used to connect with patients for Virtual Visits (Telemedicine).  Patients are able to view lab/test results, encounter notes, upcoming appointments, etc.  Non-urgent messages can be sent to your provider as well.   To learn more about what you can do with MyChart, go to [email or web address].    Your next appointment:   12 month(s)  Provider:   Dr Jacinto Halim    Other Instructions

## 2023-12-28 NOTE — Progress Notes (Signed)
Cardiology Office Note:  .   Date:  12/28/2023  ID:  [name], DOB [birth year], MRN [id number] PCP: [name]  [facility] Providers Cardiologist:  Tony Decamp, MD   History of Present Illness: .   [name] is a 78 y.o. Caucasian male patient with coronary artery disease with PCI to the RCA on 07/23/2005 with implantation of a 2.5 x 18 Cypher drug-eluting stent, moderate disease in his proximal LAD, hypertension, hyperlipidemia, diabetes mellitus and obstructive sleep apnea on CPAP and compliant and follows Dr. Frances Bond.  He has history of spinal stenosis S/P Laminectomy 03/20/2016 and chronic back pain.   Discussed the use of AI scribe software for clinical note transcription with the patient, who gave verbal consent to proceed.  History of Present Illness   The patient, with a history of diabetes, presents with concerns about his bowel movements. He reports taking fiber and propylene glycol for bowel regulation, but experiences fluctuations between overactivity and constipation. He also mentions gastroparesis related to his diabetes. The patient's diet includes a significant amount of starch, which he acknowledges could be reduced. He also mentions occasional chest pain, which he describes as infrequent and not severe enough to warrant nitroglycerin use. The patient's wife is currently battling cancer, which adds to his overall stress and concern.      Labs   External Labs:  Care everywhere labs 07/28/2023:  Serum glucose 172 mg, BUN 17, creatinine 1.0, EGFR 78 mL, potassium 4.3.  Total cholesterol 128, triglycerides 352, HDL 32, LDL 43.  A1c 6.9%.  Vitamin D28.4.  Review of Systems  Cardiovascular:  Negative for chest pain, dyspnea on exertion and leg swelling.  Musculoskeletal:  Positive for back pain.   Physical Exam:   VS:  BP 100/60 (BP Location: Left Arm, Patient Position: Sitting, Cuff Size: Large)   Pulse 67   Resp 16   Ht 6' (1.829 m)   Wt  205 lb 6.4 oz (93.2 kg)   SpO2 97%   BMI 27.86 kg/m    Wt Readings from Last 3 Encounters:  12/28/23 205 lb 6.4 oz (93.2 kg)  11/12/22 208 lb 12.8 oz (94.7 kg)  05/05/22 210 lb 3.2 oz (95.3 kg)    Physical Exam Constitutional:      Appearance: He is obese.  Neck:     Vascular: No carotid bruit or JVD.  Cardiovascular:     Rate and Rhythm: Normal rate and regular rhythm.     Pulses: Intact distal pulses.     Heart sounds: Normal heart sounds. No murmur heard.    No gallop.  Pulmonary:     Effort: Pulmonary effort is normal.     Breath sounds: Normal breath sounds.  Abdominal:     General: Bowel sounds are normal.     Palpations: Abdomen is soft.  Musculoskeletal:     Right lower leg: No edema.     Left lower leg: No edema.    Studies Reviewed: Marland Kitchen    Coronary angiogram 07/23/2005 PTCA/Stent Mid RCA 2.5 x 18 Cypher drug-eluting stent. Moderate disease in proximal LAD.   EKG:    EKG Interpretation Date/Time:  Tuesday December 28 2023 14:28:01 EST Ventricular Rate:  63 PR Interval:  204 QRS Duration:  82 QT Interval:  398 QTC Calculation: 407 R Axis:   26  Text Interpretation: EKG 12/28/2023: Normal sinus rhythm at rate of 63 bpm, anteroseptal infarct old.  Low-voltage complexes.  Compared to 11/12/2022, no significant change.  Previous heart rate was 51 bpm. Confirmed by [name] [phone]) on 12/28/2023 2:41:35 PM    EKG 11/12/2022: Sinus bradycardia at the rate of 51 bpm, left atrial enlargement, normal axis.  Borderline low voltage complexes.  No evidence of ischemia, otherwise normal EKG.   Medications and allergies    Allergies  Allergen Reactions   Fluticasone Propionate     Nose bleed (Flonase)   Benzalk Cl-Oxyquinoline Sulf Other (See Comments)    Nose Bleeds  (preservative commonly used in nasal decongestant sprays)   Bisacodyl     Causes headaches (dulcolax)   Chlorthalidone     High Glucose levels   Hydrocodone Other (See Comments)    Ineffective    Metaxalone Dermatitis and Other (See Comments)    Cherry red rash with sloughing off of skin at left upper chest/shoulder   Other Other (See Comments)    Pistachios, lima beans, and some types of chocolate cause migraines  Nasal corticosteroids causes nose bleeds   Stool Softener [Dss] Other (See Comments)    Causes migraines   Amaryl Other (See Comments)   Amoxicillin     Other reaction(s): ineffective on dental infections   Norco [Hydrocodone-Acetaminophen] Other (See Comments)    Ineffective   Pistachio Nut (Diagnostic) Other (See Comments)     In  Addition : Lima beans, milk chocolate - Migraine   Banana     Migraines    Levofloxacin Nausea Only   Lisinopril Cough and Other (See Comments)   Metformin And Related Diarrhea     Current Outpatient Medications:    acetaminophen (TYLENOL) 650 MG CR tablet, Take 650 mg by mouth every 8 (eight) hours., Disp: , Rfl:    aspirin 81 MG tablet, Take 81 mg by mouth daily., Disp: , Rfl:    baclofen (LIORESAL) 10 MG tablet, Take 5 mg by mouth 4 (four) times daily., Disp: , Rfl:    brimonidine-timolol (COMBIGAN) 0.2-0.5 % ophthalmic solution, Place 1 drop into both eyes 2 (two) times daily., Disp: , Rfl:    Chlorpheniramine-DM (CORICIDIN COUGH/COLD) 4-30 MG TABS, Take 1 tablet by mouth in the morning and at bedtime., Disp: , Rfl:    Cholecalciferol 25 MCG (1000 UT) tablet, Take 1,000 Units by mouth daily., Disp: , Rfl:    cloNIDine (CATAPRES) 0.1 MG tablet, Take 0.1 mg by mouth 2 (two) times daily., Disp: , Rfl:    dapagliflozin propanediol (FARXIGA) 10 MG TABS tablet, Take 5 mg by mouth daily., Disp: , Rfl:    dorzolamide (TRUSOPT) 2 % ophthalmic solution, Place 1 drop into both eyes 2 (two) times daily., Disp: , Rfl:    FREESTYLE LITE test strip, USE TO TEST BLOOD SUGAR ONCE DAILY, Disp: 100 strip, Rfl: 3   Lancets (FREESTYLE) lancets, Use to test blood sugar once daily, Disp: 100 each, Rfl: 12   latanoprost (XALATAN) 0.005 % ophthalmic  solution, Place 1 drop into both eyes at bedtime., Disp: , Rfl:    losartan (COZAAR) 50 MG tablet, Take 50 mg by mouth in the morning and at bedtime., Disp: , Rfl:    melatonin 1 MG TABS tablet, Take 1 mg by mouth at bedtime., Disp: , Rfl:    metoprolol tartrate (LOPRESSOR) 25 MG tablet, TAKE 1 TABLET BY MOUTH TWICE DAILY, Disp: 180 tablet, Rfl: 3   Multiple Vitamins-Minerals (PRESERVISION AREDS 2) CAPS, Take 1 capsule by mouth 2 (two) times daily., Disp: , Rfl:    OVER THE COUNTER MEDICATION, Take 1,000 mg by mouth daily.  Microlactin 1000 mg Supplement, Disp: , Rfl:    Riboflavin (B-2-400) 400 MG CAPS, Take 400 mg by mouth daily., Disp: , Rfl:    rosuvastatin (CRESTOR) 20 MG tablet, Take 1 tablet (20 mg total) by mouth daily., Disp: 90 tablet, Rfl: 3   sitaGLIPtin (JANUVIA) 100 MG tablet, Take 1 tablet (100 mg total) by mouth daily., Disp: 90 tablet, Rfl: 3   spironolactone (ALDACTONE) 25 MG tablet, Take 1 tablet (25 mg total) by mouth daily., Disp: 30 tablet, Rfl: 3   TART CHERRY PO, Take 120 mLs by mouth every evening. 4 oz, Disp: , Rfl:    vitamin B-12 (CYANOCOBALAMIN) 1000 MCG tablet, Take 1,000 mcg by mouth daily., Disp: , Rfl:    nitroGLYCERIN (NITROSTAT) 0.4 MG SL tablet, Place 1 tablet (0.4 mg total) under the tongue every 5 (five) minutes as needed for chest pain., Disp: 25 tablet, Rfl: 2   ASSESSMENT AND PLAN: .      ICD-10-CM   1. Atherosclerosis of native coronary artery of native heart with stable angina pectoris (HCC)  I25.118 EKG 12-Lead    nitroGLYCERIN (NITROSTAT) 0.4 MG SL tablet    2. Essential hypertension  I10     3. Mixed hyperlipidemia  E78.2     4. Type 2 diabetes mellitus without complication, without long-term current use of insulin (HCC)  E11.9      Assessment and Plan    Coronary Artery Disease   There is no recent chest pain or angina, and the EKG is normal. Medications include Crestor, baby aspirin, and nitroglycerin for occasional chest pain. Discussed  the risks of unnecessary cardiac testing in asymptomatic individuals, such as stroke during catheterization. Continue Crestor 20 mg daily and baby aspirin. Renew the nitroglycerin prescription, advising to carry four tablets and replace them monthly.  Hypertension   Blood pressure is well controlled with spironolactone, clonidine, losartan 50 mg daily, and metoprolol 25 mg daily. Prefers lower blood pressure to avoid headaches and discomfort. Continue current medications.  Diabetes Mellitus with Gastroparesis   Diabetes is well controlled with [place] and Januvia. Experiences diabetic gastroparesis, causing variable bowel movements. Discussed potential lactose intolerance contributing to gastrointestinal symptoms. Eliminate lactose products for two weeks to assess impact. Continue [place].  Hypertriglyceridemia   Elevated triglycerides are likely due to dietary habits, despite well-controlled diabetes. Discussed the impact of carbohydrate intake on triglyceride levels and provided dietary recommendations. Monitor carbohydrate intake with a focus on a 4:1 or 5:1 ratio of carbs to fiber. Encourage weight loss to reduce triglycerides.  General Health Maintenance   Kidney function is normal. No additional cardiac testing is required as asymptomatic and EKG is normal.  Follow-up   Schedule a follow-up appointment in one year.     Signed,  Tony Decamp, MD, [facility] 12/28/2023, 3:02 PM [facility] [address] [place], [place] [address] Phone: [phone]. Fax:  [phone]

## 2024-01-18 ENCOUNTER — Ambulatory Visit: Payer: Medicare Other | Admitting: Cardiology

## 2024-03-21 ENCOUNTER — Other Ambulatory Visit: Payer: Self-pay | Admitting: Cardiology

## 2024-09-01 ENCOUNTER — Other Ambulatory Visit: Payer: Self-pay

## 2024-09-01 ENCOUNTER — Telehealth: Payer: Self-pay | Admitting: Cardiology

## 2024-09-01 DIAGNOSIS — I25118 Atherosclerotic heart disease of native coronary artery with other forms of angina pectoris: Secondary | ICD-10-CM

## 2024-09-01 NOTE — Telephone Encounter (Signed)
*  STAT* If patient is at the pharmacy, call can be transferred to refill team.   1. Which medications need to be refilled? (please list name of each medication and dose if known)   nitroGLYCERIN  (NITROSTAT ) 0.4 MG SL tablet     2. Would you like to learn more about the convenience, safety, & potential cost savings by using the [facility]? no   3. Are you open to using the [facility] (Type [facility]. ).no   4. Which pharmacy/location (including street and city if local pharmacy) is medication to be sent to? [facility] [address] [place] [address]   5. Do they need a 30 day or 90 day supply? 30 day

## 2024-09-01 NOTE — Telephone Encounter (Signed)
 Sent in refill

## 2024-12-11 ENCOUNTER — Other Ambulatory Visit: Payer: Self-pay | Admitting: Cardiology

## 2024-12-25 ENCOUNTER — Ambulatory Visit: Admitting: Cardiology
